# Patient Record
Sex: Female | Born: 1955
Health system: Southern US, Community
[De-identification: ages and names within clinical notes are randomized; demographics above are authoritative.]

## PROBLEM LIST (undated history)

## (undated) DIAGNOSIS — M810 Age-related osteoporosis without current pathological fracture: Secondary | ICD-10-CM

## (undated) DIAGNOSIS — Z720 Tobacco use: Secondary | ICD-10-CM

## (undated) DIAGNOSIS — M8580 Other specified disorders of bone density and structure, unspecified site: Secondary | ICD-10-CM

## (undated) DIAGNOSIS — C3490 Malignant neoplasm of unspecified part of unspecified bronchus or lung: Secondary | ICD-10-CM

## (undated) DIAGNOSIS — M1990 Unspecified osteoarthritis, unspecified site: Secondary | ICD-10-CM

## (undated) DIAGNOSIS — I4891 Unspecified atrial fibrillation: Secondary | ICD-10-CM

## (undated) HISTORY — PX: FRACTURE SURGERY: SHX138

## (undated) HISTORY — DX: Malignant neoplasm of unspecified part of unspecified bronchus or lung: C34.90

## (undated) HISTORY — DX: Tobacco use: Z72.0

## (undated) HISTORY — DX: Other specified disorders of bone density and structure, unspecified site: M85.80

## (undated) HISTORY — PX: KNEE ARTHROSCOPY: SUR90

## (undated) HISTORY — DX: Age-related osteoporosis without current pathological fracture: M81.0

## (undated) HISTORY — DX: Unspecified osteoarthritis, unspecified site: M19.90

---

## 2005-04-21 ENCOUNTER — Ambulatory Visit (HOSPITAL_COMMUNITY): Admission: RE | Admit: 2005-04-21 | Discharge: 2005-04-21 | Payer: Self-pay | Admitting: Internal Medicine

## 2005-04-21 ENCOUNTER — Ambulatory Visit: Payer: Self-pay | Admitting: Internal Medicine

## 2008-02-05 ENCOUNTER — Other Ambulatory Visit: Admission: RE | Admit: 2008-02-05 | Discharge: 2008-02-05 | Payer: Self-pay | Admitting: Obstetrics and Gynecology

## 2008-12-10 ENCOUNTER — Ambulatory Visit (HOSPITAL_COMMUNITY): Admission: RE | Admit: 2008-12-10 | Discharge: 2008-12-10 | Payer: Self-pay | Admitting: Obstetrics & Gynecology

## 2009-12-01 ENCOUNTER — Other Ambulatory Visit: Admission: RE | Admit: 2009-12-01 | Discharge: 2009-12-01 | Payer: Self-pay | Admitting: Obstetrics and Gynecology

## 2009-12-02 ENCOUNTER — Ambulatory Visit (HOSPITAL_COMMUNITY): Admission: RE | Admit: 2009-12-02 | Discharge: 2009-12-02 | Payer: Self-pay | Admitting: Obstetrics & Gynecology

## 2010-05-11 ENCOUNTER — Ambulatory Visit: Payer: Self-pay | Admitting: Orthopedic Surgery

## 2010-05-11 DIAGNOSIS — IMO0002 Reserved for concepts with insufficient information to code with codable children: Secondary | ICD-10-CM | POA: Insufficient documentation

## 2010-05-11 DIAGNOSIS — M25569 Pain in unspecified knee: Secondary | ICD-10-CM | POA: Insufficient documentation

## 2010-05-11 DIAGNOSIS — S82109A Unspecified fracture of upper end of unspecified tibia, initial encounter for closed fracture: Secondary | ICD-10-CM

## 2010-05-11 DIAGNOSIS — M1710 Unilateral primary osteoarthritis, unspecified knee: Secondary | ICD-10-CM

## 2010-05-11 HISTORY — DX: Unspecified fracture of upper end of unspecified tibia, initial encounter for closed fracture: S82.109A

## 2010-10-27 NOTE — Letter (Signed)
Summary: History form  History form   Imported By: Jacklynn Ganong 05/13/2010 08:13:56  _____________________________________________________________________  External Attachment:    Type:   Image     Comment:   External Document

## 2010-10-27 NOTE — Assessment & Plan Note (Signed)
Summary: left knee pain neds xr/bcbs/bsf   Vital Signs:  Patient profile:   55 year old female Height:      64 inches Weight:      143 pounds Pulse rate:   70 / minute Resp:     16 per minute  Visit Type:  new patient Referring Provider:  self Primary Provider:  NA  CC:  left knee pain.  History of Present Illness: I saw Cynthia Kelley in the office today for an initial visit.  She is a 55 years old woman with the complaint of:  left knee pain.  This is a 55 year old female as stated who had a severe fracture with a valgus load to the LEFT knee sustaining a tibial plateau split fracture with depression and ACL avulsion back in 1999 she was treated with open treatment internal fixation and bone graft.  She angulated on the fracture too soon and the fracture fixation failed.  She was then treated at [facility], [place] with an external fixator which she wore for 3 months and this stabilized the fracture but she was LEFT with residual valgus alignment to the LEFT knee.  She is done well until last month or so where she is noted intermittent pain.  She denies weakness catching or locking.       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 Cardiovascular:  Denies chest pain, palpitations, fainting, and murmurs. Respiratory:  Denies short of breath, wheezing, couch, tightness, pain on inspiration, and snoring . Gastrointestinal:  Denies heartburn, nausea, vomiting, diarrhea, constipation, and blood in your stools. Genitourinary:   Denies frequency, urgency, difficulty urinating, painful urination, flank pain, and bleeding in urine. Neurologic:  Denies numbness, tingling, unsteady gait, dizziness, tremors, and seizure. Musculoskeletal:  Denies joint pain, swelling, instability, stiffness, redness, heat, and muscle pain. Endocrine:  Denies excessive thirst, exessive urination, and heat or cold intolerance. Psychiatric:  Denies nervousness, depression, anxiety, and hallucinations. Skin:  Denies changes in the skin, poor healing, rash, itching, and redness. HEENT:  Denies blurred or double vision, eye pain, redness, and watering. Immunology:  Denies seasonal allergies, sinus problems, and allergic to bee stings. Hemoatologic:  Denies easy bleeding and brusing.  Physical Exam  Additional Exam:  She is a well-developed well-nourished thin framed female in no acute distress  Chest normal pulse perfusion to the LEFT lower extremity with no edema and normal pulses  She has no lymphadenopathy in the LEFT lower extremity  She has 2 skin incisions one is midline and along the second as a curvilinear incision over the lateral plateau both are healed without neuroma formation and negative Tinel sign  She's awake alert and oriented x3 mood and affect are normal.  We have normal reflexes and no gross sensory changes  She ambulates normally but has a valgus knee which is quite noticeable compared to her RIGHT leg  She has some tenderness in the lateral compartment no joint effusion.  Her range of motion is 125.  Her knee is stable.  She has no varus laxity in extension or flexion.  Strength is normal.  The right lower extremity: had normal alignment, ROM, Strength and stability    Impression & Recommendations:  Problem # 1:  KNEE, ARTHRITIS, DEGEN./OSTEO (ICD-715.96) Assessment New  Orders: New Patient Level III (16109) Knee x-ray,  3 views (60454) the x-rays show that the LEFT knee has significant amount of valgus and  valgus arthrosis with degeneration of the lateral compartment there is a medial buttress plate and one lateral 65 type cancellus bone screw.  There appears to be a bone fragment from the ACL avulsion which is in slightly elevated position but appears to be healed either by fibrous union or bony union.  My impression is that her knee is finally started to become symptomatic from the severe valgus arthrosis and posttraumatic arthritis.  But she is relatively pain-free except for some intermittent symptoms and should be followed over time she will call us when she has problems.  She is been advised that she will need a knee replacement  Addendum the lateral bone structure is very cystic and I suspect week and will need supplementation either by graft or by metal augmentation when the  knee replacement comes to fruition, including a stem.  Problem # 2:  KNEE PAIN (UJW-119.14) Assessment: New  Orders: New Patient Level III (78295) Knee x-ray,  3 views (62130)  Patient Instructions: 1)  Stay in touch if you have further problems  2)  I do see a replacement in the future

## 2011-02-12 NOTE — Op Note (Signed)
Cynthia Kelley, Cynthia Kelley                  ACCOUNT NO.:  [id number]   MEDICAL RECORD NO.:  [id number]          PATIENT TYPE:  AMB   LOCATION:  DAY                           FACILITY:  [facility]   PHYSICIAN:  R. Roetta Sessions, M.D. DATE OF BIRTH:  [birth year]   DATE OF PROCEDURE:  04/21/2005  DATE OF DISCHARGE:                                 OPERATIVE REPORT   PROCEDURE:  Screening colonoscopy.   INDICATIONS:  The patient is a 55 year old lady sent over at the courtesy of  Dr. Despina Hidden for colorectal cancer screening. She is devoid of any lower GI  tract symptoms. She has never had a colonoscopy, and there is no family  history of colorectal neoplasia. Colonoscopy is now being done as a  screening maneuver. This approach has been discussed with the patient at  length. Potential risks, benefits, and alternatives have been reviewed and  questions answered. She is agreeable. Please see documentation in the  medical record.   PROCEDURE NOTE:  O2 saturation, blood pressure, pulse, and respirations were  monitored throughout the entire procedure. Conscious sedation with Versed 4  mg IV and Demerol 100 mg IV in divided doses.   INSTRUMENT:  Olympus video chip system.   FINDINGS:  Digital rectal exam revealed no abnormalities.   ENDOSCOPIC FINDINGS:  Prep was good.   Rectum:  Examination of the rectal mucosa including retroflexed view of the  anal verge revealed no abnormalities.   Colon:  Colonic mucosa was surveyed from the rectosigmoid junction through  the left, transverse, and right colon to the area of the appendiceal  orifice, ileocecal valve, and cecum. These structures were well seen and  photographed for the record. From this level, the scope was slowly  withdrawn, and all previously mentioned mucosal surfaces were again seen.  The patient was noted to have pancolonic diverticula. The remainder of the  colonic mucosa appeared normal. The patient tolerated the procedure well and  was reactive  to endoscopy.   IMPRESSION:  1.  Normal rectum.  2.  Pancolonic diverticula. The remainder of colonic mucosa appeared normal.   RECOMMENDATIONS:  1.  Diverticulosis literature provided to Cynthia Kelley.  2.  Recommend repeat screening colonoscopy 10 years.       RMR/MEDQ  D:  04/21/2005  T:  04/21/2005  Job:  [id number]   cc:   Lazaro Arms, M.D.  [address]  [place]  [place] [address]  Fax: [phone]

## 2011-07-27 ENCOUNTER — Ambulatory Visit (INDEPENDENT_AMBULATORY_CARE_PROVIDER_SITE_OTHER): Payer: BC Managed Care – PPO | Admitting: Orthopedic Surgery

## 2011-07-27 ENCOUNTER — Encounter: Payer: Self-pay | Admitting: Orthopedic Surgery

## 2011-07-27 VITALS — BP 140/70 | Ht 65.0 in | Wt 145.0 lb

## 2011-07-27 DIAGNOSIS — S62109A Fracture of unspecified carpal bone, unspecified wrist, initial encounter for closed fracture: Secondary | ICD-10-CM

## 2011-07-27 MED ORDER — HYDROCODONE-ACETAMINOPHEN 5-325 MG PO TABS: 1.0000 | ORAL_TABLET | Freq: Four times a day (QID) | ORAL | Status: AC | PRN

## 2011-07-27 NOTE — Progress Notes (Signed)
Injury RIGHT wrist.  55 year old female fell on Saturday and injured her RIGHT wrist. She went to urgent care initially, the film is read as negative. The thumb was an over read as positive and a referral was made for an evaluation. She complains of pain in the RIGHT wrist with swelling.  She has had an osteoporosis, test, and her T score was 1.2. She is on calcium and vitamin D.  Review of systems negative.  History of severe fracture to her knee treated with ligament repair and internal fixation.  Examination Physical Exam(12) GENERAL: normal development   CDV: pulses are normal   Skin: normal  Lymph: nodes were not palpable/normal  Psychiatric: awake, alert and oriented  Neuro: normal sensation  MSK normal ambulation  1 No deformity over the RIGHT distal radius, there is swelling and tenderness at the fracture site, over the radial styloid, and metaphyseal region. 2 Passive range of motion is normal, muscle tone is normal. No instability at the fracture site or the wrist joint.  Assessment: X-rays were on the disc, but the fractures. Well-visualized is a nondisplaced fracture near the radial styloid, across the metaphyseal region    Plan: Splint

## 2011-07-27 NOTE — Patient Instructions (Signed)
Brace x 6 weeks 

## 2011-08-03 ENCOUNTER — Telehealth: Payer: Self-pay | Admitting: Orthopedic Surgery

## 2011-08-03 NOTE — Telephone Encounter (Signed)
Follow up call made to patient about brace from office visit 07/27/11 regarding exchange for another size (patient needs the medium size.)  Patient's next scheduled office appointment is 09/07/11.  Per [employer] brace representative, [employer] email (copied and pasted)    "wanted to let you know that I had a medium foam wrist brace sent to the home of Cynthia Kelley. [id number]. I will call her and let her know to be looking out for the brace.  I also ordered Dr. Romeo Apple some elbow braces and wrist braces, they should be there in a couple of days.  I think that is everything. Let me know if you have any questions.  Thank You,  Murrell Converse Sales Associate [facility] [address] [place], [place] [address] [phone] Cell [email or web address]

## 2011-08-05 NOTE — Telephone Encounter (Signed)
Can pick up mon pm

## 2011-08-05 NOTE — Telephone Encounter (Signed)
08/05/11 - per phone with patient, she received the brace and said it is fine, "fits much better."

## 2011-08-11 NOTE — Telephone Encounter (Signed)
It's ready

## 2011-08-26 ENCOUNTER — Other Ambulatory Visit: Payer: Self-pay | Admitting: Orthopedic Surgery

## 2011-08-26 NOTE — Telephone Encounter (Signed)
Refill

## 2011-09-07 ENCOUNTER — Ambulatory Visit (INDEPENDENT_AMBULATORY_CARE_PROVIDER_SITE_OTHER): Payer: BC Managed Care – PPO | Admitting: Orthopedic Surgery

## 2011-09-07 ENCOUNTER — Encounter: Payer: Self-pay | Admitting: Orthopedic Surgery

## 2011-09-07 DIAGNOSIS — S62109A Fracture of unspecified carpal bone, unspecified wrist, initial encounter for closed fracture: Secondary | ICD-10-CM

## 2011-09-07 NOTE — Progress Notes (Signed)
Followup visit x-rays scheduled today for nondisplaced RIGHT distal radius fracture  Patient has history of osteopenia  She is doing well at this time she still has some discomfort over the RIGHT distal radius  Palpable tenderness is still over the RIGHT distal radius  X-ray was taken today 3 views show fracture healing no displacement  Patient will continue brace for 2 more weeks we've decided to not x-ray her again as the fractures nondisplaced and is improving  We discussed possibility gone on medication for osteoporosis but she does not have that we recommended 2 year followup from her last bone density which showed a T score of 1.2  X-ray report 3 views RIGHT wrist  Distal radius fracture same nondisplaced callous forming dorsally.  Impression healing distal radius fracture nondisplaced

## 2011-09-07 NOTE — Patient Instructions (Signed)
Wear brace about 2 more weeks

## 2012-04-04 ENCOUNTER — Other Ambulatory Visit (HOSPITAL_COMMUNITY)
Admission: RE | Admit: 2012-04-04 | Discharge: 2012-04-04 | Disposition: A | Discharge status: Home / Self Care | Payer: BC Managed Care – PPO | Source: Ambulatory Visit | Attending: Obstetrics and Gynecology | Admitting: Obstetrics and Gynecology

## 2012-04-04 ENCOUNTER — Other Ambulatory Visit: Payer: Self-pay | Admitting: Adult Health

## 2012-04-04 DIAGNOSIS — Z01419 Encounter for gynecological examination (general) (routine) without abnormal findings: Secondary | ICD-10-CM | POA: Insufficient documentation

## 2012-04-04 DIAGNOSIS — Z1159 Encounter for screening for other viral diseases: Secondary | ICD-10-CM | POA: Insufficient documentation

## 2012-04-11 ENCOUNTER — Other Ambulatory Visit: Payer: Self-pay | Admitting: Adult Health

## 2012-04-11 DIAGNOSIS — Z139 Encounter for screening, unspecified: Secondary | ICD-10-CM

## 2012-04-24 ENCOUNTER — Ambulatory Visit (HOSPITAL_COMMUNITY)
Admission: RE | Admit: 2012-04-24 | Discharge: 2012-04-24 | Disposition: A | Discharge status: Home / Self Care | Payer: BC Managed Care – PPO | Source: Ambulatory Visit | Attending: Adult Health | Admitting: Adult Health

## 2012-04-24 DIAGNOSIS — Z1231 Encounter for screening mammogram for malignant neoplasm of breast: Secondary | ICD-10-CM | POA: Insufficient documentation

## 2012-04-24 DIAGNOSIS — Z139 Encounter for screening, unspecified: Secondary | ICD-10-CM

## 2013-05-15 ENCOUNTER — Other Ambulatory Visit: Payer: Self-pay | Admitting: Adult Health

## 2013-05-15 ENCOUNTER — Encounter: Payer: Self-pay | Admitting: Adult Health

## 2013-05-15 ENCOUNTER — Ambulatory Visit (INDEPENDENT_AMBULATORY_CARE_PROVIDER_SITE_OTHER): Payer: BC Managed Care – PPO | Admitting: Adult Health

## 2013-05-15 VITALS — BP 120/70 | HR 70 | Ht 64.0 in | Wt 146.0 lb

## 2013-05-15 DIAGNOSIS — M8580 Other specified disorders of bone density and structure, unspecified site: Secondary | ICD-10-CM

## 2013-05-15 DIAGNOSIS — Z01419 Encounter for gynecological examination (general) (routine) without abnormal findings: Secondary | ICD-10-CM

## 2013-05-15 DIAGNOSIS — Z1212 Encounter for screening for malignant neoplasm of rectum: Secondary | ICD-10-CM

## 2013-05-15 DIAGNOSIS — Z780 Asymptomatic menopausal state: Secondary | ICD-10-CM

## 2013-05-15 HISTORY — DX: Other specified disorders of bone density and structure, unspecified site: M85.80

## 2013-05-15 LAB — HEMOCCULT GUIAC POC 1CARD (OFFICE)

## 2013-05-15 MED ORDER — PREDNICARBATE 0.1 % EX CREA: 1.0000 "application " | TOPICAL_CREAM | Freq: Two times a day (BID) | CUTANEOUS | Status: DC

## 2013-05-15 NOTE — Progress Notes (Signed)
Patient ID: Cynthia Kelley, female   DOB: 09-03-1956, 57 y.o.   MRN: [id number] History of Present Illness: Cynthia Kelley is a 57 year old white female in for a physical. She had a normal pap with negative HPV 03/2012.She had a normal mammogram in 03/2012 and labs in 2013 too.  Current Medications, Allergies, Past Medical History, Past Surgical History, Family History and Social History were reviewed in [employer] record.     Review of Systems: Patient denies any headaches, blurred vision, shortness of breath, chest pain, abdominal pain, problems with bowel movements, urination, or intercourse. No mood changes.She does have pain top of left foot near toes, she thinks it may be stress fracture, she has had before on the right foot.She also has a red dry patch that comes and goes over eye and has cream from dermatologist that helps but needs refill.Had osteopenia on dexa in 2011    Physical Exam:BP 120/70  Pulse 70  Ht 5\' 4"  (1.626 m)  Wt 146 lb (66.225 kg)  BMI 25.05 kg/m2 General:  Well developed, well nourished, no acute distress Skin:  Warm and dry Neck:  Midline trachea, normal thyroid Lungs; Clear to auscultation bilaterally Breast:  No dominant palpable mass, retraction, or nipple discharge Cardiovascular: Regular rate and rhythm Abdomen:  Soft, non tender, no hepatosplenomegaly Pelvic:  External genitalia is normal in appearance.  The vagina is normal in appearance. The cervix is bulbous.  Uterus is felt to be normal size, shape, and contour.  No adnexal masses or tenderness noted. Rectal: Good sphincter tone, no polyps, or hemorrhoids felt.  Hemoccult negative. Extremities:  No swelling or varicosities noted Psych:  Alert and cooperative, seems happy   Impression: Yearly gyn exam-no pap Osteopenia     Plan: Physical in 1 year Mammogram yearly Labs in 2 -3 years Dexa scan 8/25 at 1:30 pm  Colonoscopy at 58 Refilled prednicarbate 0.1% cream bid to affected  area prn #60 gm tube no refills

## 2013-05-15 NOTE — Patient Instructions (Addendum)
Physical in 1 year Mammogram yearly colonoscopy at 11

## 2013-05-21 ENCOUNTER — Telehealth: Payer: Self-pay | Admitting: *Deleted

## 2013-05-21 ENCOUNTER — Ambulatory Visit (HOSPITAL_COMMUNITY)
Admission: RE | Admit: 2013-05-21 | Discharge: 2013-05-21 | Disposition: A | Discharge status: Home / Self Care | Payer: BC Managed Care – PPO | Source: Ambulatory Visit | Attending: Adult Health | Admitting: Adult Health

## 2013-05-21 DIAGNOSIS — Z780 Asymptomatic menopausal state: Secondary | ICD-10-CM

## 2013-05-21 DIAGNOSIS — M8580 Other specified disorders of bone density and structure, unspecified site: Secondary | ICD-10-CM

## 2013-05-22 ENCOUNTER — Telehealth: Payer: Self-pay | Admitting: *Deleted

## 2013-05-22 ENCOUNTER — Telehealth: Payer: Self-pay | Admitting: Adult Health

## 2013-05-22 NOTE — Telephone Encounter (Signed)
Left message that dexa good

## 2013-05-22 NOTE — Telephone Encounter (Signed)
Pt aware of dexa results

## 2013-05-22 NOTE — Telephone Encounter (Signed)
Problem already taken care of. °

## 2013-06-21 ENCOUNTER — Other Ambulatory Visit: Payer: Self-pay | Admitting: Adult Health

## 2013-06-21 DIAGNOSIS — Z139 Encounter for screening, unspecified: Secondary | ICD-10-CM

## 2013-07-03 ENCOUNTER — Ambulatory Visit (HOSPITAL_COMMUNITY)
Admission: RE | Admit: 2013-07-03 | Discharge: 2013-07-03 | Disposition: A | Discharge status: Home / Self Care | Payer: BC Managed Care – PPO | Source: Ambulatory Visit | Attending: Adult Health | Admitting: Adult Health

## 2013-07-03 DIAGNOSIS — Z139 Encounter for screening, unspecified: Secondary | ICD-10-CM

## 2013-07-03 DIAGNOSIS — Z1231 Encounter for screening mammogram for malignant neoplasm of breast: Secondary | ICD-10-CM | POA: Insufficient documentation

## 2014-05-16 ENCOUNTER — Encounter: Payer: Self-pay | Admitting: Adult Health

## 2014-05-16 ENCOUNTER — Ambulatory Visit (INDEPENDENT_AMBULATORY_CARE_PROVIDER_SITE_OTHER): Payer: BC Managed Care – PPO | Admitting: Adult Health

## 2014-05-16 VITALS — BP 138/80 | HR 74 | Ht 63.5 in | Wt 144.5 lb

## 2014-05-16 DIAGNOSIS — Z01419 Encounter for gynecological examination (general) (routine) without abnormal findings: Secondary | ICD-10-CM

## 2014-05-16 DIAGNOSIS — Z1212 Encounter for screening for malignant neoplasm of rectum: Secondary | ICD-10-CM

## 2014-05-16 LAB — COMPREHENSIVE METABOLIC PANEL
ALBUMIN: 4.6 g/dL (ref 3.5–5.2)
ALT: 17 U/L (ref 0–35)
AST: 22 U/L (ref 0–37)
Alkaline Phosphatase: 36 U/L — ABNORMAL LOW (ref 39–117)
BUN: 7 mg/dL (ref 6–23)
CALCIUM: 9.6 mg/dL (ref 8.4–10.5)
CHLORIDE: 98 meq/L (ref 96–112)
CO2: 26 meq/L (ref 19–32)
CREATININE: 0.52 mg/dL (ref 0.50–1.10)
Glucose, Bld: 87 mg/dL (ref 70–99)
POTASSIUM: 4.9 meq/L (ref 3.5–5.3)
SODIUM: 134 meq/L — AB (ref 135–145)
TOTAL PROTEIN: 7 g/dL (ref 6.0–8.3)
Total Bilirubin: 0.4 mg/dL (ref 0.2–1.2)

## 2014-05-16 LAB — HEMOCCULT GUIAC POC 1CARD (OFFICE): Fecal Occult Blood, POC: NEGATIVE

## 2014-05-16 LAB — CBC
HCT: 38.7 % (ref 36.0–46.0)
HEMOGLOBIN: 13.5 g/dL (ref 12.0–15.0)
MCH: 32.3 pg (ref 26.0–34.0)
MCHC: 34.9 g/dL (ref 30.0–36.0)
MCV: 92.6 fL (ref 78.0–100.0)
Platelets: 320 10*3/uL (ref 150–400)
RBC: 4.18 MIL/uL (ref 3.87–5.11)
RDW: 13.9 % (ref 11.5–15.5)
WBC: 4.5 10*3/uL (ref 4.0–10.5)

## 2014-05-16 LAB — LIPID PANEL
CHOL/HDL RATIO: 1.8 ratio
Cholesterol: 183 mg/dL (ref 0–200)
HDL: 99 mg/dL (ref >39)
LDL Cholesterol: 75 mg/dL (ref 0–99)
Triglycerides: 46 mg/dL (ref <150)
VLDL: 9 mg/dL (ref 0–40)

## 2014-05-16 LAB — TSH: TSH: 1.002 u[IU]/mL (ref 0.350–4.500)

## 2014-05-16 NOTE — Patient Instructions (Signed)
Physical in 1 year Mammogram yearly Colonoscopy at 31

## 2014-05-16 NOTE — Progress Notes (Signed)
Patient ID: Cynthia Kelley, female   DOB: 05/17/1956, 58 y.o.   MRN: [id number] History of Present Illness: Cynthia Kelley is a 58 year old white female, married in for a physical.She had a normal pap with negative HPV 04/04/12.   Current Medications, Allergies, Past Medical History, Past Surgical History, Family History and Social History were reviewed in [employer] record.     Review of Systems: Patient denies any headaches, blurred vision, shortness of breath, chest pain, abdominal pain, problems with bowel movements, urination, or intercourse. No joint pain or mood swings.    Physical Exam:BP 138/80  Pulse 74  Ht 5' 3.5" (1.613 m)  Wt 144 lb 8 oz (65.545 kg)  BMI 25.19 kg/m2 General:  Well developed, well nourished, no acute distress Skin:  Warm and dry Neck:  Midline trachea, normal thyroid Lungs; Clear to auscultation bilaterally Breast:  No dominant palpable mass, retraction, or nipple discharge Cardiovascular: Regular rate and rhythm Abdomen:  Soft, non tender, no hepatosplenomegaly Pelvic:  External genitalia is normal in appearance.  The vagina has decrease color,moisture and rugae. The cervix is smooth.  Uterus is felt to be normal size, shape, and contour.  No                adnexal masses or tenderness noted. Rectal: Good sphincter tone, no polyps, or hemorrhoids felt.  Hemoccult negative. Extremities:  No swelling or varicosities noted Psych:  No mood changes, alert and cooperative, seems happy   Impression:  Yearly gyn exam no pap   Plan: Check CBC,CMP,TSH and lipids Physical and pap in 1 year Colonoscopy 2016  Mammogram yearly

## 2014-05-20 ENCOUNTER — Telehealth: Payer: Self-pay | Admitting: Adult Health

## 2014-05-20 NOTE — Telephone Encounter (Signed)
Pt aware of labs  

## 2014-07-29 ENCOUNTER — Encounter: Payer: Self-pay | Admitting: Adult Health

## 2014-10-10 ENCOUNTER — Other Ambulatory Visit: Payer: Self-pay | Admitting: Adult Health

## 2014-10-10 DIAGNOSIS — Z1231 Encounter for screening mammogram for malignant neoplasm of breast: Secondary | ICD-10-CM

## 2014-11-08 ENCOUNTER — Ambulatory Visit (HOSPITAL_COMMUNITY): Payer: Self-pay

## 2014-12-06 ENCOUNTER — Ambulatory Visit (HOSPITAL_COMMUNITY)
Admission: RE | Admit: 2014-12-06 | Discharge: 2014-12-06 | Disposition: A | Discharge status: Home / Self Care | Payer: 59 | Source: Ambulatory Visit | Attending: Adult Health | Admitting: Adult Health

## 2014-12-06 DIAGNOSIS — Z1231 Encounter for screening mammogram for malignant neoplasm of breast: Secondary | ICD-10-CM

## 2015-05-22 ENCOUNTER — Ambulatory Visit (INDEPENDENT_AMBULATORY_CARE_PROVIDER_SITE_OTHER): Payer: 59 | Admitting: Adult Health

## 2015-05-22 ENCOUNTER — Other Ambulatory Visit (HOSPITAL_COMMUNITY)
Admission: RE | Admit: 2015-05-22 | Discharge: 2015-05-22 | Disposition: A | Discharge status: Home / Self Care | Payer: 59 | Source: Ambulatory Visit | Attending: Adult Health | Admitting: Adult Health

## 2015-05-22 ENCOUNTER — Encounter: Payer: Self-pay | Admitting: Adult Health

## 2015-05-22 VITALS — BP 130/72 | HR 76 | Ht 63.5 in | Wt 142.0 lb

## 2015-05-22 DIAGNOSIS — Z1151 Encounter for screening for human papillomavirus (HPV): Secondary | ICD-10-CM | POA: Insufficient documentation

## 2015-05-22 DIAGNOSIS — Z01419 Encounter for gynecological examination (general) (routine) without abnormal findings: Secondary | ICD-10-CM

## 2015-05-22 DIAGNOSIS — Z1211 Encounter for screening for malignant neoplasm of colon: Secondary | ICD-10-CM

## 2015-05-22 LAB — HEMOCCULT GUIAC POC 1CARD (OFFICE): FECAL OCCULT BLD: NEGATIVE

## 2015-05-22 NOTE — Patient Instructions (Signed)
Physical in 1 year Mammogram yearly Colonoscopy due Labs in 1 year

## 2015-05-22 NOTE — Progress Notes (Signed)
Patient ID: Cynthia Kelley, female   DOB: Mar 15, 1956, 59 y.o.   MRN: [id number] History of Present Illness: Faiza is a 59 year old white female, married in for well woman gyn exam and pap.   Current Medications, Allergies, Past Medical History, Past Surgical History, Family History and Social History were reviewed in [employer] record.     Review of Systems: Patient denies any headaches, hearing loss, fatigue, blurred vision, shortness of breath, chest pain, abdominal pain, problems with bowel movements, urination, or intercourse. No joint pain or mood swings.    Physical Exam:BP 130/72 mmHg  Pulse 76  Ht 5' 3.5" (1.613 m)  Wt 142 lb (64.411 kg)  BMI 24.76 kg/m2 General:  Well developed, well nourished, no acute distress Skin:  Warm and dry,tan Neck:  Midline trachea, normal thyroid, good ROM, no lymphadenopathy, no carotid bruits heard Lungs; Clear to auscultation bilaterally Breast:  No dominant palpable mass, retraction, or nipple discharge Cardiovascular: Regular rate and rhythm Abdomen:  Soft, non tender, no hepatosplenomegaly Pelvic:  External genitalia is normal in appearance, no lesions.  The vagina is pale with loss of moisture and rugae. Urethra has no lesions or masses. The cervix is smooth, pap with HPV performed.  Uterus is felt to be normal size, shape, and contour.  No adnexal masses or tenderness noted.Bladder is non tender, no masses felt. Rectal: Good sphincter tone, no polyps, or hemorrhoids felt.  Hemoccult negative. Extremities/musculoskeletal:  No swelling or varicosities noted, no clubbing or cyanosis Psych:  No mood changes, alert and cooperative,seems happy   Impression: Well woman gyn exam with pap    Plan: Physical in 1 year Mammogram yearly Labs next year Colonoscopy pr GI, due about now

## 2015-05-23 LAB — CYTOLOGY - PAP

## 2016-03-09 ENCOUNTER — Other Ambulatory Visit: Payer: Self-pay | Admitting: Adult Health

## 2016-03-09 DIAGNOSIS — Z1231 Encounter for screening mammogram for malignant neoplasm of breast: Secondary | ICD-10-CM

## 2016-03-25 ENCOUNTER — Ambulatory Visit (HOSPITAL_COMMUNITY)
Admission: RE | Admit: 2016-03-25 | Discharge: 2016-03-25 | Disposition: A | Discharge status: Home / Self Care | Payer: BLUE CROSS/BLUE SHIELD | Source: Ambulatory Visit | Attending: Adult Health | Admitting: Adult Health

## 2016-03-25 DIAGNOSIS — Z1231 Encounter for screening mammogram for malignant neoplasm of breast: Secondary | ICD-10-CM | POA: Diagnosis not present

## 2016-05-25 ENCOUNTER — Encounter: Payer: Self-pay | Admitting: Adult Health

## 2016-05-25 ENCOUNTER — Ambulatory Visit (INDEPENDENT_AMBULATORY_CARE_PROVIDER_SITE_OTHER): Payer: BLUE CROSS/BLUE SHIELD | Admitting: Adult Health

## 2016-05-25 VITALS — BP 134/70 | HR 78 | Ht 63.0 in | Wt 142.5 lb

## 2016-05-25 DIAGNOSIS — Z1211 Encounter for screening for malignant neoplasm of colon: Secondary | ICD-10-CM

## 2016-05-25 DIAGNOSIS — Z01419 Encounter for gynecological examination (general) (routine) without abnormal findings: Secondary | ICD-10-CM

## 2016-05-25 LAB — HEMOCCULT GUIAC POC 1CARD (OFFICE): Fecal Occult Blood, POC: NEGATIVE

## 2016-05-25 NOTE — Progress Notes (Signed)
Patient ID: Cynthia Kelley, female   DOB: July 13, 1956, 60 y.o.   MRN: [id number] History of Present Illness: Cynthia Kelley is a 60 year old white female in for a well woman gyn exam, she had a normal pap with negative HPV 05/22/15.   Current Medications, Allergies, Past Medical History, Past Surgical History, Family History and Social History were reviewed in [employer] record.     Review of Systems: Patient denies any headaches, hearing loss, fatigue, blurred vision, shortness of breath, chest pain, abdominal pain, problems with bowel movements, urination, or intercourse. No joint pain or mood swings.    Physical Exam:BP 134/70 (BP Location: Left Arm, Patient Position: Sitting, Cuff Size: Normal)   Pulse 78   Ht '5\' 3"'$  (1.6 m)   Wt 142 lb 8 oz (64.6 kg)   BMI 25.24 kg/m  General:  Well developed, well nourished, no acute distress Skin:  Warm and dry,tan Neck:  Midline trachea, normal thyroid, good ROM, no lymphadenopathy,no carotid bruits heard Lungs; Clear to auscultation bilaterally Breast:  No dominant palpable mass, retraction, or nipple discharge Cardiovascular: Regular rate and rhythm Abdomen:  Soft, non tender, no hepatosplenomegaly Pelvic:  External genitalia is normal in appearance, no lesions.  The vagina is pale with loss of moisture and rugae.Marland Kitchen Urethra has no lesions or masses. The cervix is smooth.  Uterus is felt to be normal size, shape, and contour.  No adnexal masses or tenderness noted.Bladder is non tender, no masses felt. Rectal: Good sphincter tone, no polyps, or hemorrhoids felt.  Hemoccult negative. Extremities/musculoskeletal:  No swelling or varicosities noted, no clubbing or cyanosis Psych:  No mood changes, alert and cooperative,seems happy   Impression:  Well woman exam with routine gynecological exam - Plan: POCT occult blood stool    Plan: Physical and labs in 1 year, pap 2019 Mammogram yearly Colonoscopy advised

## 2016-05-25 NOTE — Patient Instructions (Signed)
Physical and labs next year Mammogram yearly Colonoscopy advised

## 2016-12-02 ENCOUNTER — Encounter (HOSPITAL_COMMUNITY): Payer: Self-pay | Admitting: Emergency Medicine

## 2016-12-02 ENCOUNTER — Emergency Department (HOSPITAL_COMMUNITY)
Admission: EM | Admit: 2016-12-02 | Discharge: 2016-12-02 | Disposition: A | Discharge status: Home / Self Care | Payer: BLUE CROSS/BLUE SHIELD | Attending: Emergency Medicine | Admitting: Emergency Medicine

## 2016-12-02 DIAGNOSIS — S8011XA Contusion of right lower leg, initial encounter: Secondary | ICD-10-CM | POA: Diagnosis not present

## 2016-12-02 DIAGNOSIS — S8991XA Unspecified injury of right lower leg, initial encounter: Secondary | ICD-10-CM | POA: Diagnosis present

## 2016-12-02 DIAGNOSIS — Y939 Activity, unspecified: Secondary | ICD-10-CM | POA: Insufficient documentation

## 2016-12-02 DIAGNOSIS — Y999 Unspecified external cause status: Secondary | ICD-10-CM | POA: Diagnosis not present

## 2016-12-02 DIAGNOSIS — F17210 Nicotine dependence, cigarettes, uncomplicated: Secondary | ICD-10-CM | POA: Insufficient documentation

## 2016-12-02 DIAGNOSIS — T148XXA Other injury of unspecified body region, initial encounter: Secondary | ICD-10-CM

## 2016-12-02 DIAGNOSIS — Y92007 Garden or yard of unspecified non-institutional (private) residence as the place of occurrence of the external cause: Secondary | ICD-10-CM | POA: Diagnosis not present

## 2016-12-02 DIAGNOSIS — W228XXA Striking against or struck by other objects, initial encounter: Secondary | ICD-10-CM | POA: Insufficient documentation

## 2016-12-02 MED ORDER — POVIDONE-IODINE 10 % EX SOLN
CUTANEOUS | Status: AC
  Filled 2016-12-02: qty 118

## 2016-12-02 MED ORDER — LIDOCAINE HCL (PF) 1 % IJ SOLN
5.0000 mL | Freq: Once | INTRAMUSCULAR | Status: AC
  Administered 2016-12-02: 5 mL
  Filled 2016-12-02: qty 5

## 2016-12-02 NOTE — ED Provider Notes (Signed)
[facility] Provider Note   CSN: [id number] Arrival date & time: 12/02/16  1044     History   Chief Complaint Chief Complaint  Patient presents with  . Abscess    HPI Cynthia Kelley is a 61 y.o. female presents with a two-week history of right lower leg pain after falling in her garden and hitting her leg on a branch. Patient reports associated swelling and discoloration to the wound. Patient saw urgent care 2 days ago and was given antibiotics. Patient reports that the doctor at urgent care wanted to incise and drain the wound, however patient did not want to do that at that time. Patient denies any other symptoms, including fever, nausea, vomiting. Patient has been taking Bactrim since it was prescribed 2 days ago (4 doses).  HPI  Past Medical History:  Diagnosis Date  . Osteopenia 05/15/2013    Patient Active Problem List   Diagnosis Date Noted  . Osteopenia 05/15/2013  . Wrist fracture 07/27/2011  . KNEE, ARTHRITIS, DEGEN./OSTEO 05/11/2010  . KNEE PAIN 05/11/2010  . FRACTURE, TIBIAL PLATEAU 05/11/2010    Past Surgical History:  Procedure Laterality Date  . KNEE ARTHROSCOPY      OB History    Gravida Para Term Preterm AB Living   '1 1       1   '$ SAB TAB Ectopic Multiple Live Births           1       Home Medications    Prior to Admission medications   Medication Sig Start Date End Date Taking? Authorizing Provider  Prednicarbate 0.1 % CREA Apply 1 application topically 2 (two) times daily. Patient taking differently: Apply 1 application topically as needed.  05/15/13   Estill Dooms, NP    Family History Family History  Problem Relation Age of Onset  . Cancer Father     died in 16's everywhere  . Heart disease Mother   . Cancer Mother     lung  . Asthma Daughter     Social History Social History  Substance Use Topics  . Smoking status: Current Every Day Smoker    Packs/day: 0.50    Years: 20.00    Types: Cigarettes  . Smokeless  tobacco: Never Used  . Alcohol use Yes     Comment: 7-8 drinks a week     Allergies   Patient has no known allergies.   Review of Systems Review of Systems  Constitutional: Negative for fever.  Gastrointestinal: Negative for nausea and vomiting.  Skin: Positive for color change and wound.     Physical Exam Updated Vital Signs BP 159/76 (BP Location: Right Arm)   Pulse 95   Temp 99 F (37.2 C) (Oral)   Resp 20   Ht '5\' 5"'$  (1.651 m)   Wt 65.8 kg   SpO2 100%   BMI 24.13 kg/m   Physical Exam  Constitutional: She appears well-developed and well-nourished. No distress.  HENT:  Head: Normocephalic and atraumatic.  Mouth/Throat: Oropharynx is clear and moist. No oropharyngeal exudate.  Eyes: Conjunctivae are normal. Pupils are equal, round, and reactive to light. Right eye exhibits no discharge. Left eye exhibits no discharge. No scleral icterus.  Neck: Normal range of motion. Neck supple. No thyromegaly present.  Cardiovascular: Normal rate, regular rhythm, normal heart sounds and intact distal pulses.  Exam reveals no gallop and no friction rub.   No murmur heard. Pulmonary/Chest: Effort normal and breath sounds normal. No stridor. No respiratory  distress. She has no wheezes. She has no rales.  Abdominal: Soft. Bowel sounds are normal. She exhibits no distension. There is no tenderness. There is no rebound and no guarding.  Musculoskeletal: She exhibits no edema.  Lymphadenopathy:    She has no cervical adenopathy.  Neurological: She is alert. Coordination normal.  Skin: Skin is warm and dry. No rash noted. She is not diaphoretic. No pallor.  ~5cm area of fluctuance swelling and tenderness to R lower leg with overlying scabbing- see image  Ecchymosis without tenderness to R ankle and foot, see image  Psychiatric: She has a normal mood and affect.  Nursing note and vitals reviewed.          ED Treatments / Results  Labs (all labs ordered are listed, but only  abnormal results are displayed) Labs Reviewed - No data to display  EKG  EKG Interpretation None       Radiology No results found.  Procedures Procedures (including critical care time)  INCISION AND DRAINAGE Performed by: Frederica Kuster Consent: Verbal consent obtained. Risks and benefits: risks, benefits and alternatives were discussed Type: hematoma  Body area: R lower leg  Anesthesia: local infiltration  Incision was made with a scalpel.  Local anesthetic: lidocaine 1/% w/o epinephrine  Anesthetic total: 3 ml  Complexity: complex  Drainage: blood and gelatinous blood material  Drainage amount: significant  Packing material: none  Patient tolerance: Patient tolerated the procedure well with no immediate complications.     Medications Ordered in ED Medications  lidocaine (PF) (XYLOCAINE) 1 % injection 5 mL (5 mLs Infiltration Given by Other 12/02/16 1250)     Initial Impression / Assessment and Plan / ED Course  I have reviewed the triage vital signs and the nursing notes.  Pertinent labs & imaging results that were available during my care of the patient were reviewed by me and considered in my medical decision making (see chart for details).     Patient with heamtoma. Incision and drainage of ultimately hematoma performed in the ED today.  Area minimized significantly. Suspect bruising related to injury. Discontinue Bactrim since not infection at this time. Follow-up in 2 days for recheck. Strict return precautions given. Patient understands and agrees with plan. Patient vitals stable throughout ED course and discharged in satisfactory condition. Patient also evaluated by Dr. Rogene Houston who guided the patient's management and agrees with plan.    Final Clinical Impressions(s) / ED Diagnoses   Final diagnoses:  Hematoma    New Prescriptions Discharge Medication List as of 12/02/2016  1:04 PM       Frederica Kuster, PA-C 12/04/16 0102    Fredia Sorrow, MD 12/04/16 [phone]

## 2016-12-02 NOTE — ED Triage Notes (Signed)
PT c/o swelling, pain and blackened areas to right lower lateral leg that developed after a leg injury in the past 2 weeks. PT states no relief from antibiotics that were prescribed by urgent care x2 days ago.

## 2016-12-02 NOTE — ED Provider Notes (Signed)
Medical screening examination/treatment/procedure(s) were conducted as a shared visit with non-physician practitioner(s) and myself.  I personally evaluated the patient during the encounter.   EKG Interpretation None       Patient seen by me along with the physician assistant. Patient with an injury to the lateral aspect of her right leg about 2 weeks ago. Was hit by a branch. Patient had a black and swollen area there. And had the bruising that seemed to collect down around her foot and ankle. This is probably consistent with a hematoma. She was started on Bactrim by primary care doctor 2 days ago thinking that there was an abscess. Wound was open thinking it was an abscess no purulent discharge just jellylike hematoma. No active bleeding currently. Will be left open. Local wound care is appropriate. As stated no signs of infection just the injury resulted in a hematoma which explains the bruising around the foot and ankle.   Fredia Sorrow, MD 12/02/16 1254

## 2016-12-02 NOTE — Discharge Instructions (Signed)
You can discontinue your Bactrim. Leave the dressing applied until tomorrow. At that time, you can wash the area with warm soapy water. Apply clean dressing. Please return to the emergency department 2 days for wound recheck. Please return sooner if you develop any increasing pain, redness, swelling, drainage from the area, chest pain, shortness of breath, or any other concerning symptoms.

## 2016-12-04 ENCOUNTER — Emergency Department (HOSPITAL_COMMUNITY)
Admission: EM | Admit: 2016-12-04 | Discharge: 2016-12-04 | Discharge status: Absent without leave | Payer: BLUE CROSS/BLUE SHIELD

## 2016-12-14 ENCOUNTER — Encounter: Payer: Self-pay | Admitting: Family Medicine

## 2016-12-14 ENCOUNTER — Ambulatory Visit (INDEPENDENT_AMBULATORY_CARE_PROVIDER_SITE_OTHER): Payer: BLUE CROSS/BLUE SHIELD | Admitting: Family Medicine

## 2016-12-14 VITALS — BP 144/84 | HR 88 | Temp 97.1°F | Resp 16 | Ht 65.0 in | Wt 144.1 lb

## 2016-12-14 DIAGNOSIS — Z720 Tobacco use: Secondary | ICD-10-CM | POA: Diagnosis not present

## 2016-12-14 DIAGNOSIS — Z1211 Encounter for screening for malignant neoplasm of colon: Secondary | ICD-10-CM

## 2016-12-14 DIAGNOSIS — Z23 Encounter for immunization: Secondary | ICD-10-CM | POA: Diagnosis not present

## 2016-12-14 DIAGNOSIS — Z7689 Persons encountering health services in other specified circumstances: Secondary | ICD-10-CM

## 2016-12-14 DIAGNOSIS — I739 Peripheral vascular disease, unspecified: Secondary | ICD-10-CM | POA: Diagnosis not present

## 2016-12-14 DIAGNOSIS — Z2821 Immunization not carried out because of patient refusal: Secondary | ICD-10-CM

## 2016-12-14 HISTORY — DX: Tobacco use: Z72.0

## 2016-12-14 HISTORY — DX: Peripheral vascular disease, unspecified: I73.9

## 2016-12-14 LAB — CBC
HEMATOCRIT: 39.8 % (ref 35.0–45.0)
HEMOGLOBIN: 13.5 g/dL (ref 11.7–15.5)
MCH: 32.1 pg (ref 27.0–33.0)
MCHC: 33.9 g/dL (ref 32.0–36.0)
MCV: 94.8 fL (ref 80.0–100.0)
MPV: 8.8 fL (ref 7.5–12.5)
Platelets: 352 10*3/uL (ref 140–400)
RBC: 4.2 MIL/uL (ref 3.80–5.10)
RDW: 14.4 % (ref 11.0–15.0)
WBC: 6.5 10*3/uL (ref 3.8–10.8)

## 2016-12-14 NOTE — Patient Instructions (Signed)
Try to quit smoking  I will order a cologuard  Need blood work I will send you a letter with your test results.  If there is anything of concern, we will call right away.  Walk every day that you are able  See me yearly  See about insurance approval for a shingles shot

## 2016-12-14 NOTE — Progress Notes (Signed)
Chief Complaint  Patient presents with  . Establish Care   New to establish Has no ongoing medical problems Takes no prescriptions I have discussed the multiple health risks associated with cigarette smoking including, but not limited to, cardiovascular disease, lung disease and cancer.  I have strongly recommended that smoking be stopped.  I have reviewed the various methods of quitting including cold [place], classes, nicotine replacements and prescription medications.  I have offered assistance in this difficult process.  The patient is not interested in assistance at this time. She drinks alcohol daily, states 1-2 beers a day.   GYN care up to date No regular eye care or dental visits Last colonoscopy at age 53.  Agrees to cologuard. Discussed immunizations.  Recommend prevnar, shingles vaccine and flu shot - all declined.  Will consider the shingles, but needs insurance approval for shingrix.     Patient Active Problem List   Diagnosis Date Noted  . Tobacco abuse 12/14/2016  . Asymptomatic PVD (peripheral vascular disease) ([place]) 12/14/2016  . Influenza vaccine refused 12/14/2016  . Osteopenia 05/15/2013  . KNEE, ARTHRITIS, DEGEN./OSTEO 05/11/2010  . FRACTURE, TIBIAL PLATEAU 05/11/2010    Outpatient Encounter Prescriptions as of 12/14/2016  Medication Sig   No facility-administered encounter medications on file as of 12/14/2016.     Past Medical History:  Diagnosis Date  . Arthritis   . Osteopenia 05/15/2013  . Osteoporosis    osteopenia  . Tobacco abuse 12/14/2016    Past Surgical History:  Procedure Laterality Date  . FRACTURE SURGERY     torn ACL  . KNEE ARTHROSCOPY      Social History   Social History  . Marital status: Married    Spouse name: [name]  . Number of children: 1  . Years of education: 40   Occupational History  . dog groomer     retired   Social History Main Topics  . Smoking status: Current Every Day Smoker    Packs/day: 0.50    Years:  20.00    Types: Cigarettes    Start date: 09/28/1963  . Smokeless tobacco: Never Used  . Alcohol use 6.0 oz/week    10 Cans of beer per week     Comment: 1-2 beers a day  . Drug use: No  . Sexual activity: Yes    Birth control/ protection: Post-menopausal   Other Topics Concern  . Not on file   Social History Narrative   Retired Air traffic controller   Lives with husband [name]   Daughter Marcelino Scot is in [place] to walk    Family History  Problem Relation Age of Onset  . Cancer Father     died in 26's everywhere  . Heart disease Mother   . Cancer Mother     lung  . Stroke Mother   . Asthma Daughter     Review of Systems  Constitutional: Negative for chills, fever and weight loss.  HENT: Negative for congestion and hearing loss.   Eyes: Negative for blurred vision and pain.  Respiratory: Negative for cough and shortness of breath.   Cardiovascular: Negative for chest pain and leg swelling.  Gastrointestinal: Negative for abdominal pain, constipation, diarrhea and heartburn.  Genitourinary: Negative for dysuria and frequency.  Musculoskeletal: Positive for joint pain. Negative for falls and myalgias.       Rare pain L knee  Neurological: Negative for dizziness, seizures and headaches.  Psychiatric/Behavioral: Negative for depression. The patient is not nervous/anxious and does  not have insomnia.     BP (!) 144/84   Pulse 88   Temp 97.1 F (36.2 C) (Temporal)   Resp 16   Ht '5\' 5"'$  (1.651 m)   Wt 144 lb 1.3 oz (65.4 kg)   SpO2 100%   BMI 23.98 kg/m   Physical Exam  Constitutional: She is oriented to person, place, and time. She appears well-developed and well-nourished. No distress.  HENT:  Head: Normocephalic and atraumatic.  Right Ear: External ear normal.  Left Ear: External ear normal.  Nose: Nose normal.  Mouth/Throat: Oropharynx is clear and moist.  Eyes: Conjunctivae are normal. Pupils are equal, round, and reactive to light.  Neck: Normal range of motion. Neck  supple. No thyromegaly present.  Cardiovascular: Normal rate, regular rhythm and normal heart sounds.   Pulmonary/Chest: Effort normal and breath sounds normal. No respiratory distress.  Abdominal: Soft. Bowel sounds are normal.  Musculoskeletal: Normal range of motion. She exhibits no edema.  L knee with valgus deformity.  Both feet with varus deformities.  Hematoma R calf - healing  Lymphadenopathy:    She has no cervical adenopathy.  Neurological: She is alert and oriented to person, place, and time.  Gait mildly antalgic  Skin: Skin is warm and dry. Capillary refill takes 2 to 3 seconds.  Violaceous nose.  Hands hyperemic.  Delayed cap refill. Cold to touch.  Psychiatric: She has a normal mood and affect. Her behavior is normal. Thought content normal.  Nursing note and vitals reviewed.  ASSESSMENT/PLAN:  1. Encounter to establish care with new doctor  - CBC - Comprehensive metabolic panel - Lipid panel - VITAMIN D 25 Hydroxy (Vit-D Deficiency, Fractures) - Urinalysis, Routine w reflex microscopic  2. Tobacco abuse Advised to QUIT  3. Screen for colon cancer  - Cologuard  4. Asymptomatic PVD (peripheral vascular disease) ([place]) May have vasospasm - raynauds in hands vs decreases circ. From smoking.    5. Influenza vaccine refused    Patient Instructions  Try to quit smoking  I will order a cologuard  Need blood work I will send you a letter with your test results.  If there is anything of concern, we will call right away.  Walk every day that you are able  See me yearly  See about insurance approval for a shingles shot   Raylene Everts, MD

## 2016-12-15 ENCOUNTER — Encounter: Payer: Self-pay | Admitting: Family Medicine

## 2016-12-15 LAB — LIPID PANEL
CHOL/HDL RATIO: 2 ratio (ref <5.0)
CHOLESTEROL: 172 mg/dL (ref <200)
HDL: 85 mg/dL (ref >50)
LDL Cholesterol: 74 mg/dL (ref <100)
TRIGLYCERIDES: 65 mg/dL (ref <150)
VLDL: 13 mg/dL (ref <30)

## 2016-12-15 LAB — URINALYSIS, ROUTINE W REFLEX MICROSCOPIC
Bilirubin Urine: NEGATIVE
GLUCOSE, UA: NEGATIVE
HGB URINE DIPSTICK: NEGATIVE
KETONES UR: NEGATIVE
LEUKOCYTES UA: NEGATIVE
Nitrite: NEGATIVE
PH: 7 (ref 5.0–8.0)
Protein, ur: NEGATIVE
SPECIFIC GRAVITY, URINE: 1.008 (ref 1.001–1.035)

## 2016-12-15 LAB — COMPREHENSIVE METABOLIC PANEL
ALBUMIN: 4.3 g/dL (ref 3.6–5.1)
ALK PHOS: 49 U/L (ref 33–130)
ALT: 13 U/L (ref 6–29)
AST: 19 U/L (ref 10–35)
BILIRUBIN TOTAL: 0.6 mg/dL (ref 0.2–1.2)
BUN: 9 mg/dL (ref 7–25)
CALCIUM: 9.6 mg/dL (ref 8.6–10.4)
CO2: 27 mmol/L (ref 20–31)
Chloride: 96 mmol/L — ABNORMAL LOW (ref 98–110)
Creat: 0.5 mg/dL (ref 0.50–0.99)
GLUCOSE: 109 mg/dL — AB (ref 65–99)
POTASSIUM: 4.8 mmol/L (ref 3.5–5.3)
Sodium: 132 mmol/L — ABNORMAL LOW (ref 135–146)
Total Protein: 6.7 g/dL (ref 6.1–8.1)

## 2016-12-15 LAB — VITAMIN D 25 HYDROXY (VIT D DEFICIENCY, FRACTURES): VIT D 25 HYDROXY: 74 ng/mL (ref 30–100)

## 2017-06-06 ENCOUNTER — Other Ambulatory Visit: Payer: BLUE CROSS/BLUE SHIELD | Admitting: Adult Health

## 2017-06-15 ENCOUNTER — Ambulatory Visit (INDEPENDENT_AMBULATORY_CARE_PROVIDER_SITE_OTHER): Payer: BLUE CROSS/BLUE SHIELD | Admitting: Adult Health

## 2017-06-15 ENCOUNTER — Encounter: Payer: Self-pay | Admitting: Adult Health

## 2017-06-15 VITALS — BP 100/60 | HR 72 | Ht 63.0 in | Wt 142.0 lb

## 2017-06-15 DIAGNOSIS — Z01419 Encounter for gynecological examination (general) (routine) without abnormal findings: Secondary | ICD-10-CM

## 2017-06-15 DIAGNOSIS — Z1211 Encounter for screening for malignant neoplasm of colon: Secondary | ICD-10-CM | POA: Diagnosis not present

## 2017-06-15 DIAGNOSIS — Z1212 Encounter for screening for malignant neoplasm of rectum: Secondary | ICD-10-CM | POA: Diagnosis not present

## 2017-06-15 LAB — HEMOCCULT GUIAC POC 1CARD (OFFICE): FECAL OCCULT BLD: NEGATIVE

## 2017-06-15 NOTE — Progress Notes (Signed)
Patient ID: Cynthia Kelley, female   DOB: 1955-11-14, 61 y.o.   MRN: [id number] History of Present Illness: Cynthia Kelley is a 61 year old white female, married in for well woman gyn exam, she had normal pap with negative HPV 05/22/15. PCP is Dr Meda Coffee.    Current Medications, Allergies, Past Medical History, Past Surgical History, Family History and Social History were reviewed in [employer] record.     Review of Systems: Patient denies any headaches, hearing loss, fatigue, blurred vision, shortness of breath, chest pain, abdominal pain, problems with bowel movements, urination, or intercourse. No joint pain or mood swings.    Physical Exam:BP 100/60 (BP Location: Left Arm, Patient Position: Sitting, Cuff Size: Small)   Pulse 72   Ht 5\' 3"  (1.6 m)   Wt 142 lb (64.4 kg)   BMI 25.15 kg/m  General:  Well developed, well nourished, no acute distress Skin:  Warm and dry Neck:  Midline trachea, normal thyroid, good ROM, no lymphadenopathy,no carotid bruits heard Lungs; Clear to auscultation bilaterally Breast:  No dominant palpable mass, retraction, or nipple discharge Cardiovascular: Regular rate and rhythm Abdomen:  Soft, non tender, no hepatosplenomegaly Pelvic:  External genitalia is normal in appearance, no lesions.  The vagina is normal in appearance. Urethra has no lesions or masses. The cervix is smooth.  Uterus is felt to be normal size, shape, and contour.  No adnexal masses or tenderness noted.Bladder is non tender, no masses felt. Rectal: Good sphincter tone, no polyps, or hemorrhoids felt.  Hemoccult negative. Extremities/musculoskeletal:  No swelling or varicosities noted, no clubbing, has some discoloration right lower leg, where had injury in March  Psych:  No mood changes, alert and cooperative,seems happy PHQ 2 score 0.  Impression:  1. Well woman exam with routine gynecological exam   2. Screening for colorectal cancer      Plan: Pap and physical in 1  year Mammogram yearly Labs with PCP To do cologard

## 2017-06-15 NOTE — Patient Instructions (Addendum)
Pap and physical in 1 year Mammogram yearly Labs with PCP To go Cologard

## 2017-07-29 ENCOUNTER — Other Ambulatory Visit: Payer: Self-pay

## 2017-07-29 ENCOUNTER — Ambulatory Visit (INDEPENDENT_AMBULATORY_CARE_PROVIDER_SITE_OTHER): Payer: BLUE CROSS/BLUE SHIELD | Admitting: Family Medicine

## 2017-07-29 ENCOUNTER — Encounter: Payer: Self-pay | Admitting: Family Medicine

## 2017-07-29 VITALS — BP 140/78 | HR 91 | Resp 15 | Ht 63.0 in | Wt 140.0 lb

## 2017-07-29 DIAGNOSIS — R601 Generalized edema: Secondary | ICD-10-CM | POA: Diagnosis not present

## 2017-07-29 DIAGNOSIS — I499 Cardiac arrhythmia, unspecified: Secondary | ICD-10-CM

## 2017-07-29 DIAGNOSIS — Z1211 Encounter for screening for malignant neoplasm of colon: Secondary | ICD-10-CM

## 2017-07-29 DIAGNOSIS — G5601 Carpal tunnel syndrome, right upper limb: Secondary | ICD-10-CM

## 2017-07-29 MED ORDER — FUROSEMIDE 20 MG PO TABS: 20.0000 mg | ORAL_TABLET | Freq: Every day | ORAL | 3 refills | Status: DC

## 2017-07-29 NOTE — Progress Notes (Signed)
Chief Complaint  Patient presents with  . hand swelling    swelling in fingers x 2 weeks. They keep her up some nights aching and the swelling is worse in the am    Patient is here for an acute visit.  She complains of hand swelling.  She states she is not having any swelling anywhere else, abdomen, face, or ankles.  She is noticed that for the last couple of weeks.  It is worse in the morning and better throughout the day.  Her hands are very stiff and uncomfortable when she first wakes up.  At times she gets numbness in her right hand.  She suspects she has carpal tunnel syndrome in her right hand.  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  She has no numbness in her left hand.  She has no cough shortness of breath or chest pain.  No prior history of any edema.  No change in her diet.  She endorses little salt intake.  No new medicines or supplements.  Sometimes her fingertips look purple.  This is not specifically when she is cold. She continues to smoke cigarettes. She continues to drink alcohol on a daily basis.  Patient Active Problem List   Diagnosis Date Noted  . Irregular heartbeat 07/29/2017  . Tobacco abuse 12/14/2016  . Asymptomatic PVD (peripheral vascular disease) ([place]) 12/14/2016  . Influenza vaccine refused 12/14/2016  . Osteopenia 05/15/2013  . KNEE, ARTHRITIS, DEGEN./OSTEO 05/11/2010  . FRACTURE, TIBIAL PLATEAU 05/11/2010    Outpatient Encounter Prescriptions as of 07/29/2017  Medication Sig  . furosemide (LASIX) 20 MG tablet Take 1 tablet (20 mg total) by mouth daily.   No facility-administered encounter medications on file as of 07/29/2017.     No Known Allergies  Review of Systems  Constitutional: Negative for activity change, appetite change, fatigue and unexpected weight change.  HENT: Negative for congestion, dental problem, postnasal drip and rhinorrhea.   Eyes: Negative for redness and visual disturbance.  Respiratory: Negative for  cough and shortness of breath.   Cardiovascular: Negative for chest pain, palpitations and leg swelling.  Gastrointestinal: Negative for abdominal pain, constipation and diarrhea.  Genitourinary: Negative for difficulty urinating and frequency.  Musculoskeletal: Negative for arthralgias and back pain.  Skin: Positive for color change.       Swelling of hands, discolored fingers  Neurological: Positive for numbness. Negative for dizziness and headaches.       Numbness right hand, especially at night  Psychiatric/Behavioral: Negative for dysphoric mood and sleep disturbance. The patient is not nervous/anxious.     BP 140/78   Pulse 91   Resp 15   Ht 5\' 3"  (1.6 m)   Wt 140 lb (63.5 kg)   SpO2 98%   BMI 24.80 kg/m   Physical Exam  Constitutional: She is oriented to person, place, and time. She appears well-developed and well-nourished. No distress.  HENT:  Head: Normocephalic and atraumatic.  Right Ear: External ear normal.  Left Ear: External ear normal.  Nose: Nose normal.  Mouth/Throat: Oropharynx is clear and moist.  Eyes: Pupils are equal, round, and reactive to light. Conjunctivae are normal.  Neck: Normal range of motion. Neck supple. No thyromegaly present.  Cardiovascular: Normal rate, normal heart sounds and intact distal pulses.   Heart sounds irregularly irregular on exam.  60 seconds of auscultation.  Spells of regular rhythm.  On EKG patient is in sinus rhythm with  abberent PACs  Pulmonary/Chest: Effort normal and breath sounds normal. No respiratory distress.  Abdominal: Soft. Bowel sounds are normal.  Musculoskeletal: Normal range of motion. She exhibits edema.  L knee with valgus deformity.  Both feet with varus deformities.  Patient has pitting edema to the mid shin  Lymphadenopathy:    She has no cervical adenopathy.  Neurological: She is alert and oriented to person, place, and time.  Gait mildly antalgic.  Positive Phalen's and Tinel's right hand.  Skin:  Skin is warm and dry. Capillary refill takes 2 to 3 seconds.  Violaceous nose.  Hands hyperemic.  Delayed cap refill.  Hands with mild visible edema fingers  Psychiatric: She has a normal mood and affect. Her behavior is normal. Thought content normal.  Nursing note and vitals reviewed.   ASSESSMENT/PLAN:  1. Generalized edema Unclear etiology.  Edema of hands is somewhat worrisome.  No other signs of fluid overload such as JVD or rales.  EKG is normal with few PACs - COMPLETE METABOLIC PANEL WITH GFR - TSH - Urinalysis, Routine w reflex microscopic - CBC - B Nat Peptide  2. Screening for colon cancer Patient request - Cologuard  3. Carpal tunnel syndrome, right History of.  Patient does not want referral for nerve conduction studies 4.  Irregular heartbeat EKG done  Patient Instructions  Take fluid pill daily. Avoid salt in diet. Get lab test today. Call Monday for results.   Raylene Everts, MD

## 2017-07-29 NOTE — Patient Instructions (Signed)
Take fluid pill daily. Avoid salt in diet. Get lab test today. Call Monday for results.

## 2017-07-30 LAB — CBC
HEMATOCRIT: 37.8 % (ref 35.0–45.0)
Hemoglobin: 12.7 g/dL (ref 11.7–15.5)
MCH: 28.3 pg (ref 27.0–33.0)
MCHC: 33.6 g/dL (ref 32.0–36.0)
MCV: 84.2 fL (ref 80.0–100.0)
MPV: 9.7 fL (ref 7.5–12.5)
PLATELETS: 614 10*3/uL — AB (ref 140–400)
RBC: 4.49 10*6/uL (ref 3.80–5.10)
RDW: 12.6 % (ref 11.0–15.0)
WBC: 11.8 10*3/uL — ABNORMAL HIGH (ref 3.8–10.8)

## 2017-07-30 LAB — TSH: TSH: 1.27 m[IU]/L (ref 0.40–4.50)

## 2017-07-30 LAB — COMPLETE METABOLIC PANEL WITH GFR
AG Ratio: 1.2 (calc) (ref 1.0–2.5)
ALBUMIN MSPROF: 3.5 g/dL — AB (ref 3.6–5.1)
ALKALINE PHOSPHATASE (APISO): 113 U/L (ref 33–130)
ALT: 16 U/L (ref 6–29)
AST: 16 U/L (ref 10–35)
BUN / CREAT RATIO: 11 (calc) (ref 6–22)
BUN: 4 mg/dL — AB (ref 7–25)
CALCIUM: 9.3 mg/dL (ref 8.6–10.4)
CO2: 27 mmol/L (ref 20–32)
CREATININE: 0.37 mg/dL — AB (ref 0.50–0.99)
Chloride: 94 mmol/L — ABNORMAL LOW (ref 98–110)
GFR, EST AFRICAN AMERICAN: 132 mL/min/{1.73_m2} (ref >=60)
GFR, EST NON AFRICAN AMERICAN: 114 mL/min/{1.73_m2} (ref >=60)
GLOBULIN: 3 g/dL (ref 1.9–3.7)
GLUCOSE: 97 mg/dL (ref 65–139)
Potassium: 5 mmol/L (ref 3.5–5.3)
SODIUM: 131 mmol/L — AB (ref 135–146)
TOTAL PROTEIN: 6.5 g/dL (ref 6.1–8.1)
Total Bilirubin: 0.6 mg/dL (ref 0.2–1.2)

## 2017-07-30 LAB — URINALYSIS, ROUTINE W REFLEX MICROSCOPIC
BACTERIA UA: NONE SEEN /HPF
Bilirubin Urine: NEGATIVE
Glucose, UA: NEGATIVE
HGB URINE DIPSTICK: NEGATIVE
HYALINE CAST: NONE SEEN /LPF
KETONES UR: NEGATIVE
Nitrite: NEGATIVE
PH: 7 (ref 5.0–8.0)
PROTEIN: NEGATIVE
RBC / HPF: NONE SEEN /HPF (ref 0–2)
Specific Gravity, Urine: 1.001 (ref 1.001–1.03)

## 2017-07-30 LAB — BRAIN NATRIURETIC PEPTIDE: Brain Natriuretic Peptide: 142 pg/mL — ABNORMAL HIGH (ref <100)

## 2017-08-02 ENCOUNTER — Encounter: Payer: Self-pay | Admitting: Family Medicine

## 2017-08-15 ENCOUNTER — Telehealth: Payer: Self-pay | Admitting: *Deleted

## 2017-08-15 NOTE — Telephone Encounter (Signed)
Patient called to discuss the medication she is on for swelling. Please advise [phone]

## 2017-08-16 NOTE — Telephone Encounter (Signed)
Called pt, made appt for 11 [phone] 18, pt did not feel she needed to be seen before that because she is going out of town early tomorrow am.

## 2017-08-16 NOTE — Telephone Encounter (Signed)
Spoke to Cynthia Kelley states the lasix didn't help her edema, c/o fatigue, and not feeling well. Would you like me to have her come in to see you?

## 2017-08-16 NOTE — Telephone Encounter (Signed)
Need to see  Her again

## 2017-08-23 ENCOUNTER — Ambulatory Visit: Payer: BLUE CROSS/BLUE SHIELD | Admitting: Family Medicine

## 2017-09-06 ENCOUNTER — Other Ambulatory Visit: Payer: Self-pay

## 2017-09-06 ENCOUNTER — Ambulatory Visit (INDEPENDENT_AMBULATORY_CARE_PROVIDER_SITE_OTHER): Payer: BLUE CROSS/BLUE SHIELD | Admitting: Family Medicine

## 2017-09-06 ENCOUNTER — Encounter: Payer: Self-pay | Admitting: Family Medicine

## 2017-09-06 VITALS — BP 128/78 | HR 100 | Temp 97.8°F | Resp 16 | Ht 63.0 in | Wt 132.1 lb

## 2017-09-06 DIAGNOSIS — R634 Abnormal weight loss: Secondary | ICD-10-CM

## 2017-09-06 DIAGNOSIS — R601 Generalized edema: Secondary | ICD-10-CM

## 2017-09-06 DIAGNOSIS — R6881 Early satiety: Secondary | ICD-10-CM | POA: Diagnosis not present

## 2017-09-06 DIAGNOSIS — R160 Hepatomegaly, not elsewhere classified: Secondary | ICD-10-CM

## 2017-09-06 DIAGNOSIS — R1013 Epigastric pain: Secondary | ICD-10-CM | POA: Diagnosis not present

## 2017-09-06 NOTE — Progress Notes (Addendum)
Chief Complaint  Patient presents with  . Edema    hands, feet since sept   Patient is back for follow-up She is still experiencing swelling in her hands and feet, hands greater than feet.  She still is losing weight.  Her appetite is poor.  She states when she starts to eat she cannot finish a meal.  She is consistently losing weight.  She also feels quite tired.  I reviewed with her blood work from last visit.  She has thrombocytosis and a millimeter elevated white blood count.  These are nonspecific findings.  She also had a slightly low albumin. Patient continues to smoke cigarettes. She drinks, to excess.  She states a 12 pack lasts her "3-4 days".  She drinks beer every day 3-4 beers.  She denies any problems from the alcohol, any gastritis or GI distress.  She is never been told she had elevated liver functions or cirrhosis. I have concerns regarding the onset of these of vague symptoms and findings.  They could be anything from inflammation, infection, metabolic disease to malignancy.  The weight loss is especially concerning. She denies any headache, sore throat, difficulty chewing or swallowing.  No lung symptoms or difficulty breathing wheezing cough.  No rapid heartbeats chest pain or pressure.  No abdominal symptoms, no heartburn or stomach pain.  Regular bowels.  No urinary complaints.  No joint aches. I gave her a prescription for Lasix to try for the excess fluid.  She took 1 a day with no improvement, took 2 a day no improvement, so stopped the medication.  Patient Active Problem List   Diagnosis Date Noted  . Irregular heartbeat 07/29/2017  . Tobacco abuse 12/14/2016  . Asymptomatic PVD (peripheral vascular disease) ([place]) 12/14/2016  . Influenza vaccine refused 12/14/2016  . Osteopenia 05/15/2013  . KNEE, ARTHRITIS, DEGEN./OSTEO 05/11/2010  . FRACTURE, TIBIAL PLATEAU 05/11/2010    Outpatient Encounter Medications as of 09/06/2017  Medication Sig  . furosemide (LASIX)  20 MG tablet Take 1 tablet (20 mg total) by mouth daily. (Patient not taking: Reported on 09/06/2017)   No facility-administered encounter medications on file as of 09/06/2017.     No Known Allergies  Review of Systems  Constitutional: Positive for appetite change. Negative for activity change, fatigue and unexpected weight change.  HENT: Negative for congestion, dental problem, postnasal drip and rhinorrhea.   Eyes: Negative for redness and visual disturbance.  Respiratory: Negative for cough and shortness of breath.   Cardiovascular: Positive for leg swelling. Negative for chest pain and palpitations.       Trace edema in feet and ankles  Gastrointestinal: Negative for abdominal pain, constipation and diarrhea.       Easy satiety  Genitourinary: Negative for difficulty urinating and frequency.  Musculoskeletal: Negative for arthralgias and back pain.  Skin: Positive for color change.       Swelling of hands, discolored fingers  Neurological: Positive for numbness. Negative for dizziness and headaches.       Numbness right hand, especially at night  Psychiatric/Behavioral: Negative for dysphoric mood and sleep disturbance. The patient is not nervous/anxious.     BP 128/78 (BP Location: Right Arm, Patient Position: Sitting, Cuff Size: Normal)   Pulse 100   Temp 97.8 F (36.6 C) (Temporal)   Resp 16   Ht 5\' 3"  (1.6 m)   Wt 132 lb 1.9 oz (59.9 kg)   SpO2 97%   BMI 23.40 kg/m   Physical Exam  Constitutional: She  is oriented to person, place, and time. She appears well-developed and well-nourished. No distress.  HENT:  Head: Normocephalic and atraumatic.  Right Ear: External ear normal.  Left Ear: External ear normal.  Nose: Nose normal.  Mouth/Throat: Oropharynx is clear and moist.  Eyes: Conjunctivae are normal. Pupils are equal, round, and reactive to light.  Neck: Normal range of motion. Neck supple. No thyromegaly present.  Cardiovascular: Normal rate, normal heart  sounds and intact distal pulses.  Heart sounds regular on exam.  60 seconds of auscultation.  Frequent ectopy  Pulmonary/Chest: Effort normal and breath sounds normal. No respiratory distress.  Abdominal: Soft. Bowel sounds are normal. There is hepatomegaly.  Liver edge palpable 3 cm below right costal margin.  Nontender.  Spleen is not palpable.  Mid epigastric tenderness to deep pressure.  Musculoskeletal: Normal range of motion. She exhibits edema.  L knee with valgus deformity.  Both feet with varus deformities.  Patient has trace pitting edema pitting edema  Lymphadenopathy:    She has no cervical adenopathy.  Neurological: She is alert and oriented to person, place, and time.  Gait mildly antalgic.  Positive Phalen's and Tinel's right hand.  Skin: Skin is warm and dry. Capillary refill takes 2 to 3 seconds.  Violaceous nose.  Hands mildly hyperemic.  Hands with mild visible edema fingers  Psychiatric: She has a normal mood and affect. Her behavior is normal. Thought content normal.  Nursing note and vitals reviewed.   ASSESSMENT/PLAN:  1. Weight loss, non-intentional  - CBC with Differential/Platelet - COMPLETE METABOLIC PANEL WITH GFR - TSH  2. Early satiety   3. Hepatomegaly   4. Generalized edema   5. Epigastric pain  - CT Abdomen Pelvis W Contrast; Future  With weight loss and hepatomegaly, I am concerned about malignancy.  I feel investigation for hepatic malignancy and or metaststic disease is needed.she is also demonstrating progressive anemia.  Addendum 09/12/17 Patient Instructions  Repeat blood test today Need CT scan abdomen I will send you a letter with your test results.  If there is anything of concern, we will call right away.    Raylene Everts, MD

## 2017-09-06 NOTE — Patient Instructions (Signed)
Repeat blood test today Need CT scan abdomen I will send you a letter with your test results.  If there is anything of concern, we will call right away.

## 2017-09-09 LAB — COMPLETE METABOLIC PANEL WITH GFR
AG RATIO: 1.2 (calc) (ref 1.0–2.5)
ALBUMIN MSPROF: 3.5 g/dL — AB (ref 3.6–5.1)
ALT: 12 U/L (ref 6–29)
AST: 14 U/L (ref 10–35)
Alkaline phosphatase (APISO): 123 U/L (ref 33–130)
BILIRUBIN TOTAL: 0.5 mg/dL (ref 0.2–1.2)
BUN / CREAT RATIO: 11 (calc) (ref 6–22)
BUN: 4 mg/dL — AB (ref 7–25)
CALCIUM: 8.9 mg/dL (ref 8.6–10.4)
CHLORIDE: 93 mmol/L — AB (ref 98–110)
CO2: 28 mmol/L (ref 20–32)
Creat: 0.36 mg/dL — ABNORMAL LOW (ref 0.50–0.99)
GFR, EST AFRICAN AMERICAN: 133 mL/min/{1.73_m2} (ref >=60)
GFR, EST NON AFRICAN AMERICAN: 115 mL/min/{1.73_m2} (ref >=60)
Globulin: 3 g/dL (calc) (ref 1.9–3.7)
Glucose, Bld: 110 mg/dL (ref 65–139)
Potassium: 4.3 mmol/L (ref 3.5–5.3)
Sodium: 130 mmol/L — ABNORMAL LOW (ref 135–146)
TOTAL PROTEIN: 6.5 g/dL (ref 6.1–8.1)

## 2017-09-09 LAB — CBC WITH DIFFERENTIAL/PLATELET
BASOS PCT: 0.3 %
Basophils Absolute: 35 cells/uL (ref 0–200)
EOS ABS: 70 {cells}/uL (ref 15–500)
Eosinophils Relative: 0.6 %
HCT: 35 % (ref 35.0–45.0)
HEMOGLOBIN: 11.4 g/dL — AB (ref 11.7–15.5)
LYMPHS ABS: 2216 {cells}/uL (ref 850–3900)
MCH: 26.8 pg — ABNORMAL LOW (ref 27.0–33.0)
MCHC: 32.6 g/dL (ref 32.0–36.0)
MCV: 82.4 fL (ref 80.0–100.0)
MPV: 9.4 fL (ref 7.5–12.5)
Monocytes Relative: 8.8 %
NEUTROS ABS: 8259 {cells}/uL — AB (ref 1500–7800)
Neutrophils Relative %: 71.2 %
PLATELETS: 630 10*3/uL — AB (ref 140–400)
RBC: 4.25 10*6/uL (ref 3.80–5.10)
RDW: 14.3 % (ref 11.0–15.0)
Total Lymphocyte: 19.1 %
WBC: 11.6 10*3/uL — AB (ref 3.8–10.8)
WBCMIX: 1021 {cells}/uL — AB (ref 200–950)

## 2017-09-09 LAB — TSH: TSH: 1.09 mIU/L (ref 0.40–4.50)

## 2017-09-12 ENCOUNTER — Telehealth: Payer: Self-pay | Admitting: Family Medicine

## 2017-09-12 NOTE — Telephone Encounter (Signed)
Left message returning your call  [phone]

## 2017-09-12 NOTE — Telephone Encounter (Signed)
Spoke to [employer], aware insurance has denied her authorization as of right now for her ct, I have faxed additional information to them, per dr Meda Coffee.

## 2017-09-12 NOTE — Telephone Encounter (Signed)
lmtcb

## 2017-09-12 NOTE — Telephone Encounter (Signed)
Tillie Rung calling from [facility] [phone]. She needs a Auth for a CT scan for Pt Cynthia Kelley  Today by 2pm

## 2017-09-13 ENCOUNTER — Ambulatory Visit (HOSPITAL_COMMUNITY): Payer: BLUE CROSS/BLUE SHIELD

## 2017-09-13 ENCOUNTER — Telehealth: Payer: Self-pay | Admitting: Family Medicine

## 2017-09-13 NOTE — Telephone Encounter (Signed)
Can you call Yvett Rossel on [phone]

## 2017-09-14 NOTE — Telephone Encounter (Signed)
Spoke to Cynthia Kelley, has an appt on 12 [phone] 18  at [facility], I have printed order to be faxed.

## 2017-09-14 NOTE — Telephone Encounter (Signed)
thanks

## 2017-09-15 ENCOUNTER — Ambulatory Visit (INDEPENDENT_AMBULATORY_CARE_PROVIDER_SITE_OTHER): Payer: BLUE CROSS/BLUE SHIELD

## 2017-09-15 ENCOUNTER — Telehealth: Payer: Self-pay | Admitting: Family Medicine

## 2017-09-15 ENCOUNTER — Encounter (INDEPENDENT_AMBULATORY_CARE_PROVIDER_SITE_OTHER): Payer: Self-pay

## 2017-09-15 DIAGNOSIS — R350 Frequency of micturition: Secondary | ICD-10-CM

## 2017-09-15 LAB — POCT URINALYSIS DIPSTICK
Glucose, UA: NEGATIVE
KETONES UA: NEGATIVE
NITRITE UA: NEGATIVE
PH UA: 6 (ref 5.0–8.0)
PROTEIN UA: 30
Spec Grav, UA: 1.015 (ref 1.010–1.025)
UROBILINOGEN UA: 0.2 U/dL

## 2017-09-15 NOTE — Telephone Encounter (Signed)
Please call on her cell phone [phone], you have questions regarding the CT scan

## 2017-09-15 NOTE — Telephone Encounter (Signed)
Spoke to [employer], thinks she may have a uti, is coming here around 21 for a u/a.

## 2017-09-16 LAB — URINE CULTURE
MICRO NUMBER:: 81434930
SPECIMEN QUALITY:: ADEQUATE

## 2017-09-19 NOTE — Progress Notes (Signed)
Spoke to Shaianne, aware.

## 2017-09-19 NOTE — Progress Notes (Signed)
Left message to call office

## 2017-09-21 ENCOUNTER — Telehealth: Payer: Self-pay | Admitting: Family Medicine

## 2017-09-21 NOTE — Telephone Encounter (Signed)
Done, and faxed back.

## 2017-09-21 NOTE — Telephone Encounter (Signed)
Sharyn Lull with [facility] is calling. Needs the order changed to Oral or IV or BOTH for the CT scan, Test is being done on Friday. [phone]

## 2017-09-21 NOTE — Telephone Encounter (Signed)
Both.  Please pend

## 2017-09-27 HISTORY — PX: PORTACATH PLACEMENT: SHX2246

## 2017-09-28 ENCOUNTER — Ambulatory Visit: Payer: BLUE CROSS/BLUE SHIELD | Admitting: Family Medicine

## 2017-09-29 ENCOUNTER — Ambulatory Visit (HOSPITAL_COMMUNITY)
Admission: RE | Admit: 2017-09-29 | Discharge: 2017-09-29 | Disposition: A | Discharge status: Home / Self Care | Payer: BLUE CROSS/BLUE SHIELD | Source: Ambulatory Visit | Attending: Family Medicine | Admitting: Family Medicine

## 2017-09-29 ENCOUNTER — Other Ambulatory Visit: Payer: Self-pay

## 2017-09-29 ENCOUNTER — Encounter: Payer: Self-pay | Admitting: Family Medicine

## 2017-09-29 ENCOUNTER — Encounter (HOSPITAL_COMMUNITY): Payer: Self-pay | Admitting: Radiology

## 2017-09-29 ENCOUNTER — Other Ambulatory Visit: Payer: Self-pay | Admitting: Family Medicine

## 2017-09-29 ENCOUNTER — Telehealth: Payer: Self-pay

## 2017-09-29 ENCOUNTER — Telehealth: Payer: Self-pay | Admitting: Family Medicine

## 2017-09-29 DIAGNOSIS — J9811 Atelectasis: Secondary | ICD-10-CM | POA: Insufficient documentation

## 2017-09-29 DIAGNOSIS — R911 Solitary pulmonary nodule: Secondary | ICD-10-CM

## 2017-09-29 DIAGNOSIS — I700 Atherosclerosis of aorta: Secondary | ICD-10-CM | POA: Diagnosis not present

## 2017-09-29 DIAGNOSIS — R918 Other nonspecific abnormal finding of lung field: Secondary | ICD-10-CM | POA: Diagnosis present

## 2017-09-29 DIAGNOSIS — R079 Chest pain, unspecified: Secondary | ICD-10-CM | POA: Diagnosis present

## 2017-09-29 DIAGNOSIS — C787 Secondary malignant neoplasm of liver and intrahepatic bile duct: Secondary | ICD-10-CM

## 2017-09-29 DIAGNOSIS — R160 Hepatomegaly, not elsewhere classified: Secondary | ICD-10-CM

## 2017-09-29 MED ORDER — HYDROCODONE-ACETAMINOPHEN 7.5-325 MG PO TABS: 1.0000 | ORAL_TABLET | Freq: Four times a day (QID) | ORAL | 0 refills | Status: DC | PRN

## 2017-09-29 MED ORDER — IOPAMIDOL (ISOVUE-370) INJECTION 76%
100.0000 mL | Freq: Once | INTRAVENOUS | Status: AC | PRN
  Administered 2017-09-29: 100 mL via INTRAVENOUS

## 2017-09-29 NOTE — Telephone Encounter (Signed)
Pt is calling in for Pain Medicine, please call it into [place] in [place].

## 2017-09-29 NOTE — Progress Notes (Signed)
CT result reviewed.  biopsy ordered.

## 2017-09-29 NOTE — Telephone Encounter (Signed)
I called Cynthia Kelley and asked her to come in to discuss her test results.  She had a CAT scan performed of her abdomen on 09/23/2017.  This was done because of weight loss and hepatomegaly.  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  There is also a 3 cm spiculated mass more peripheral with necrosis.  In addition, unfortunately, there is a liver mass with necrosis suggestive of metastasis. I asked Cynthia Kelley to come to the office soon she is here with her husband Cynthia Kelley.  I presented them the results.  I called Dr. Luan Pulling in pulmonary medicine for advice.  Cynthia Kelley tells me today that she has new onset of left chest pain with deep breath and movement for the last 2 days.  I have concern for pulmonary embolus.  Dr. Luan Pulling recommends stat CT of her chest.  He also recommended that I contact interventional radiology to inquire about biopsy.  This will be ordered today.

## 2017-09-29 NOTE — Telephone Encounter (Signed)
Prior auth # for CT A   [id number].Marland Kitchen    Ap radiology aware   kendra.

## 2017-09-29 NOTE — Telephone Encounter (Signed)
Noted, second request.

## 2017-09-29 NOTE — Telephone Encounter (Signed)
Patients husband [name] called, he said Dr.Nelson was sending in a prescription for patient to [facility] today. I dont see anything  In her chart. Please advise. Cb#: [phone] (husband [facility] cell #)

## 2017-09-29 NOTE — Progress Notes (Unsigned)
Spoke with Dr Meda Coffee regarding Cynthia Kelley's liver lesion bx.  I called radiology scheduling to make sure they see the order in Epic.

## 2017-09-29 NOTE — Telephone Encounter (Signed)
Prior auth done for chest ct # [id number].

## 2017-09-30 ENCOUNTER — Other Ambulatory Visit: Payer: Self-pay | Admitting: Family Medicine

## 2017-09-30 MED ORDER — HYDROCODONE-ACETAMINOPHEN 7.5-325 MG PO TABS: 1.0000 | ORAL_TABLET | Freq: Four times a day (QID) | ORAL | 0 refills | Status: DC | PRN

## 2017-09-30 NOTE — Telephone Encounter (Signed)
Prescription for hydrocodone was sent to the [facility].  I called patient to confirm that she had picked this up and it was helping her.

## 2017-10-03 ENCOUNTER — Telehealth: Payer: Self-pay | Admitting: Family Medicine

## 2017-10-03 NOTE — Telephone Encounter (Signed)
Patient is requesting a call back from dr. Meda Coffee, she has questions about her procedure coming up.

## 2017-10-04 NOTE — Telephone Encounter (Signed)
Jovi had a "list".  We discussed her findings.  Her follow-up.  Her biopsy.  Her medications.  Referrals.  She has spoken with her daughter who wants her to have a second opinion prior to the biopsy.  I told her I think the biopsy is important and that we have already had 2 radiologist confirm she has a liver lesion.  Once we have the biopsy results we can refer her further.

## 2017-10-06 ENCOUNTER — Other Ambulatory Visit: Payer: Self-pay | Admitting: Radiology

## 2017-10-10 ENCOUNTER — Other Ambulatory Visit (HOSPITAL_COMMUNITY): Payer: Self-pay | Admitting: Interventional Radiology

## 2017-10-10 ENCOUNTER — Ambulatory Visit (HOSPITAL_COMMUNITY)
Admission: RE | Admit: 2017-10-10 | Discharge: 2017-10-10 | Disposition: A | Discharge status: Home / Self Care | Payer: BLUE CROSS/BLUE SHIELD | Source: Ambulatory Visit | Attending: Family Medicine | Admitting: Family Medicine

## 2017-10-10 ENCOUNTER — Encounter (HOSPITAL_COMMUNITY): Payer: Self-pay

## 2017-10-10 DIAGNOSIS — Z8249 Family history of ischemic heart disease and other diseases of the circulatory system: Secondary | ICD-10-CM | POA: Insufficient documentation

## 2017-10-10 DIAGNOSIS — Z825 Family history of asthma and other chronic lower respiratory diseases: Secondary | ICD-10-CM | POA: Insufficient documentation

## 2017-10-10 DIAGNOSIS — Z801 Family history of malignant neoplasm of trachea, bronchus and lung: Secondary | ICD-10-CM | POA: Diagnosis not present

## 2017-10-10 DIAGNOSIS — R634 Abnormal weight loss: Secondary | ICD-10-CM | POA: Insufficient documentation

## 2017-10-10 DIAGNOSIS — C787 Secondary malignant neoplasm of liver and intrahepatic bile duct: Secondary | ICD-10-CM | POA: Diagnosis not present

## 2017-10-10 DIAGNOSIS — Z6821 Body mass index (BMI) 21.0-21.9, adult: Secondary | ICD-10-CM | POA: Diagnosis not present

## 2017-10-10 DIAGNOSIS — F17210 Nicotine dependence, cigarettes, uncomplicated: Secondary | ICD-10-CM | POA: Diagnosis not present

## 2017-10-10 DIAGNOSIS — C801 Malignant (primary) neoplasm, unspecified: Secondary | ICD-10-CM | POA: Diagnosis not present

## 2017-10-10 DIAGNOSIS — M810 Age-related osteoporosis without current pathological fracture: Secondary | ICD-10-CM | POA: Diagnosis not present

## 2017-10-10 DIAGNOSIS — R918 Other nonspecific abnormal finding of lung field: Secondary | ICD-10-CM | POA: Insufficient documentation

## 2017-10-10 DIAGNOSIS — M1990 Unspecified osteoarthritis, unspecified site: Secondary | ICD-10-CM | POA: Diagnosis not present

## 2017-10-10 DIAGNOSIS — R079 Chest pain, unspecified: Secondary | ICD-10-CM | POA: Insufficient documentation

## 2017-10-10 DIAGNOSIS — K769 Liver disease, unspecified: Secondary | ICD-10-CM | POA: Diagnosis present

## 2017-10-10 DIAGNOSIS — Z823 Family history of stroke: Secondary | ICD-10-CM | POA: Insufficient documentation

## 2017-10-10 LAB — CBC
HEMATOCRIT: 35.1 % — AB (ref 36.0–46.0)
Hemoglobin: 11.8 g/dL — ABNORMAL LOW (ref 12.0–15.0)
MCH: 26.9 pg (ref 26.0–34.0)
MCHC: 33.6 g/dL (ref 30.0–36.0)
MCV: 80 fL (ref 78.0–100.0)
PLATELETS: 736 10*3/uL — AB (ref 150–400)
RBC: 4.39 MIL/uL (ref 3.87–5.11)
RDW: 15.1 % (ref 11.5–15.5)
WBC: 16.6 10*3/uL — AB (ref 4.0–10.5)

## 2017-10-10 LAB — APTT: aPTT: 39 seconds — ABNORMAL HIGH (ref 24–36)

## 2017-10-10 LAB — PROTIME-INR
INR: 1.07
Prothrombin Time: 13.8 seconds (ref 11.4–15.2)

## 2017-10-10 MED ORDER — GELATIN ABSORBABLE 12-7 MM EX MISC
CUTANEOUS | Status: AC
  Filled 2017-10-10: qty 1

## 2017-10-10 MED ORDER — SODIUM CHLORIDE 0.9 % IV SOLN: INTRAVENOUS | Status: DC

## 2017-10-10 MED ORDER — MIDAZOLAM HCL 2 MG/2ML IJ SOLN
INTRAMUSCULAR | Status: AC
  Filled 2017-10-10: qty 2

## 2017-10-10 MED ORDER — HYDROCODONE-ACETAMINOPHEN 5-325 MG PO TABS: 1.0000 | ORAL_TABLET | ORAL | Status: DC | PRN

## 2017-10-10 MED ORDER — FENTANYL CITRATE (PF) 100 MCG/2ML IJ SOLN
INTRAMUSCULAR | Status: AC
  Filled 2017-10-10: qty 2

## 2017-10-10 MED ORDER — MIDAZOLAM HCL 2 MG/2ML IJ SOLN
INTRAMUSCULAR | Status: AC | PRN
  Administered 2017-10-10 (×2): 1 mg via INTRAVENOUS

## 2017-10-10 MED ORDER — LIDOCAINE HCL (PF) 1 % IJ SOLN
INTRAMUSCULAR | Status: AC
  Administered 2017-10-10: 10 mL
  Filled 2017-10-10: qty 30

## 2017-10-10 MED ORDER — FENTANYL CITRATE (PF) 100 MCG/2ML IJ SOLN
INTRAMUSCULAR | Status: AC | PRN
  Administered 2017-10-10 (×2): 50 ug via INTRAVENOUS

## 2017-10-10 NOTE — Procedures (Signed)
  Procedure: US liver lesion core bx 18g x3  EBL:   minimal Complications:  none immediate  See full dictation in [facility].  Dillard Cannon MD Main # [phone] Pager  [phone]

## 2017-10-10 NOTE — Discharge Instructions (Addendum)
Liver Biopsy, Care After °Refer to this sheet in the next few weeks. These instructions provide you with information on caring for yourself after your procedure. Your health care provider may also give you more specific instructions. Your treatment has been planned according to current medical practices, but problems sometimes occur. Call your health care provider if you have any problems or questions after your procedure. °What can I expect after the procedure? °After your procedure, it is typical to have the following: °· A small amount of discomfort in the area where the biopsy was done and in the right shoulder or shoulder blade. °· A small amount of bruising around the area where the biopsy was done and on the skin over the liver. °· Sleepiness and fatigue for the rest of the day. ° °Follow these instructions at home: °· Rest at home for 1-2 days or as directed by your health care provider. °· Have a friend or family member stay with you for at least 24 hours. °· Because of the medicines used during the procedure, you should not do the following things in the first 24 hours: °? Drive. °? Use machinery. °? Be responsible for the care of other people. °? Sign legal documents. °? Take a bath or shower. °· There are many different ways to close and cover an incision, including stitches, skin glue, and adhesive strips. Follow your health care provider's instructions on: °? Incision care. °? Bandage (dressing) changes and removal. °? Incision closure removal. °· Do not drink alcohol in the first week. °· Do not lift more than 5 pounds or play contact sports for 2 weeks after this test. °· Take medicines only as directed by your health care provider. Do not take medicine containing aspirin or non-steroidal anti-inflammatory medicines such as ibuprofen for 1 week after this test. °· It is your responsibility to get your test results. °Contact a health care provider if: °· You have increased bleeding from an incision  that results in more than a small spot of blood. °· You have redness, swelling, or increasing pain in any incisions. °· You notice a discharge or a bad smell coming from any of your incisions. °· You have a fever or chills. °Get help right away if: °· You develop swelling, bloating, or pain in your abdomen. °· You become dizzy or faint. °· You develop a rash. °· You are nauseous or vomit. °· You have difficulty breathing, feel short of breath, or feel faint. °· You develop chest pain. °· You have problems with your speech or vision. °· You have trouble balancing or moving your arms or legs. °This information is not intended to replace advice given to you by your health care provider. Make sure you discuss any questions you have with your health care provider. °Document Released: 04/02/2005 Document Revised: 02/19/2016 Document Reviewed: 11/09/2013 °Elsevier Interactive Patient Education © 2018 [employer] °Moderate Conscious Sedation, Adult, Care After °These instructions provide you with information about caring for yourself after your procedure. Your health care provider may also give you more specific instructions. Your treatment has been planned according to current medical practices, but problems sometimes occur. Call your health care provider if you have any problems or questions after your procedure. °What can I expect after the procedure? °After your procedure, it is common: °· To feel sleepy for several hours. °· To feel clumsy and have poor balance for several hours. °· To have poor judgment for several hours. °· To vomit if you eat   too soon. ° °Follow these instructions at home: °For at least 24 hours after the procedure: ° °· Do not: °? Participate in activities where you could fall or become injured. °? Drive. °? Use heavy machinery. °? Drink alcohol. °? Take sleeping pills or medicines that cause drowsiness. °? Make important decisions or sign legal documents. °? Take care of children on your  own. °· Rest. °Eating and drinking °· Follow the diet recommended by your health care provider. °· If you vomit: °? Drink water, juice, or soup when you can drink without vomiting. °? Make sure you have little or no nausea before eating solid foods. °General instructions °· Have a responsible adult stay with you until you are awake and alert. °· Take over-the-counter and prescription medicines only as told by your health care provider. °· If you smoke, do not smoke without supervision. °· Keep all follow-up visits as told by your health care provider. This is important. °Contact a health care provider if: °· You keep feeling nauseous or you keep vomiting. °· You feel light-headed. °· You develop a rash. °· You have a fever. °Get help right away if: °· You have trouble breathing. °This information is not intended to replace advice given to you by your health care provider. Make sure you discuss any questions you have with your health care provider. °Document Released: 07/04/2013 Document Revised: 02/16/2016 Document Reviewed: 01/03/2016 °Elsevier Interactive Patient Education © 2018 [employer] ° °

## 2017-10-10 NOTE — Sedation Documentation (Signed)
Patient is resting comfortably. 

## 2017-10-10 NOTE — H&P (Signed)
Chief Complaint: Patient was seen in consultation today for liver lesion biopsy at the request of Raylene Everts  Referring Physician(s): Raylene Everts  Supervising Physician: Arne Cleveland  Patient Status: [facility] - Out-pt  History of Present Illness: Cynthia Kelley is a 62 y.o. female   Left chest pain for weeks Continued unintentional wt loss +smoker; + ETOH  CT 09/29/17: IMPRESSION: No evidence of pulmonary embolism. Compression of RIGHT lower lobe pulmonary arteries centrally by significant adenopathy at RIGHT hilum. RIGHT lower lobe cavitary mass with significant RIGHT hilar and subcarinal adenopathy. Peripheral atelectasis in RIGHT lower lobe. Nonspecific RIGHT upper lobe nodules. Two liver lesions consistent with hepatic metastases as noted on prior outside CT exam. Two LEFT upper quadrant soft tissue nodules question metastases.  Scheduled now for liver lesion biopsy  Past Medical History:  Diagnosis Date  . Arthritis   . Osteopenia 05/15/2013  . Osteoporosis    osteopenia  . Tobacco abuse 12/14/2016    Past Surgical History:  Procedure Laterality Date  . FRACTURE SURGERY     torn ACL  . KNEE ARTHROSCOPY      Allergies: Patient has no known allergies.  Medications: Prior to Admission medications   Medication Sig Start Date End Date Taking? Authorizing Provider  calcium carbonate (OS-CAL - DOSED IN MG OF ELEMENTAL CALCIUM) 1250 (500 Ca) MG tablet Take 1 tablet by mouth 3 (three) times daily with meals.   Yes [provider]  diphenhydrAMINE (BENADRYL) 25 mg capsule Take 50 mg by mouth at bedtime as needed for sleep.   Yes [provider]  HYDROcodone-acetaminophen (NORCO) 7.5-325 MG tablet Take 1 tablet by mouth every 6 (six) hours as needed for moderate pain. 09/30/17  Yes Raylene Everts, MD  ibuprofen (ADVIL,MOTRIN) 200 MG tablet Take 600-800 mg by mouth every 8 (eight) hours as needed for mild pain or moderate pain.   Yes  [provider]  Multiple Vitamins-Minerals (MULTIVITAMIN WITH MINERALS) tablet Take 1 tablet by mouth daily.   Yes [provider]     Family History  Problem Relation Age of Onset  . Cancer Father        died in 54's everywhere  . Heart disease Mother   . Cancer Mother        lung  . Stroke Mother   . Asthma Daughter     Social History   Socioeconomic History  . Marital status: Married    Spouse name: [name]  . Number of children: 1  . Years of education: 44  . Highest education level: None  Social Needs  . Financial resource strain: None  . Food insecurity - worry: None  . Food insecurity - inability: None  . Transportation needs - medical: None  . Transportation needs - non-medical: None  Occupational History  . Occupation: Air traffic controller    Comment: retired  Tobacco Use  . Smoking status: Current Every Day Smoker    Packs/day: 0.50    Years: 20.00    Pack years: 10.00    Types: Cigarettes    Start date: 09/28/1963  . Smokeless tobacco: Never Used  Substance and Sexual Activity  . Alcohol use: Yes    Alcohol/week: 12.0 oz    Types: 20 Cans of beer per week    Comment: 3-4 beers a day  . Drug use: No  . Sexual activity: Yes    Birth control/protection: Post-menopausal  Other Topics Concern  . None  Social History Narrative   Retired Magazine features editor  groomer   Lives with husband Delanna Notice is in [place] to walk     Review of Systems: A 12 point ROS discussed and pertinent positives are indicated in the HPI above.  All other systems are negative.  Review of Systems  Constitutional: Positive for unexpected weight change. Negative for activity change, appetite change and fatigue.  Respiratory: Negative for cough and shortness of breath.   Cardiovascular: Positive for chest pain.  Gastrointestinal: Negative for abdominal pain.  Neurological: Negative for weakness.  Psychiatric/Behavioral: Negative for behavioral problems and confusion.     Vital Signs: BP (!) 128/59   Pulse 92   Temp 98.3 F (36.8 C) (Oral)   Resp 16   Ht 5\' 5"  (1.651 m)   Wt 132 lb (59.9 kg)   SpO2 96%   BMI 21.97 kg/m   Physical Exam  Constitutional: She is oriented to person, place, and time.  Cardiovascular: Normal rate, regular rhythm and normal heart sounds.  Pulmonary/Chest: Effort normal and breath sounds normal.  Abdominal: Soft. Bowel sounds are normal.  Musculoskeletal: Normal range of motion.  Neurological: She is alert and oriented to person, place, and time.  Skin: Skin is warm and dry.  Psychiatric: She has a normal mood and affect. Her behavior is normal. Judgment and thought content normal.  Nursing note and vitals reviewed.   Imaging: Ct Angio Chest Pe W Or Wo Contrast  Result Date: 09/29/2017 CLINICAL DATA:  New onset LEFT chest pain, known RIGHT lung tumor and liver metastasis, lung nodule EXAM: CT ANGIOGRAPHY CHEST WITH CONTRAST TECHNIQUE: Multidetector CT imaging of the chest was performed using the standard protocol during bolus administration of intravenous contrast. Multiplanar CT image reconstructions and MIPs were obtained to evaluate the vascular anatomy. CONTRAST:  100 ml Isovue 300 IV COMPARISON:  Outside CT abdomen and pelvis from [facility] dated 09/23/2017 FINDINGS: Cardiovascular: Scattered atherosclerotic calcifications aorta and proximal great vessels. Aorta normal caliber without aneurysm or dissection. Pulmonary arteries patent, though compression of RIGHT lower lobe pulmonary arteries by RIGHT hilar adenopathy is identified. No filling defects identified to suggest pulmonary embolism. No pericardial effusion. Mediastinum/Nodes: Significant thoracic adenopathy including: 2.6 cm short axis subcarinal node image 46 and multiple RIGHT hilar nodes including 17 mm short axis and 15 mm short axis image 51 and an inferior RIGHT hilar node 2.0 cm image 54 (previously 1.7 cm), noted on prior CT, Lungs/Pleura:  Cavitary mass RIGHT lower lobe 3.8 x 2.9 cm image 90 previously. Two tiny nodules in RIGHT upper lobe images 24 and 39 with an additional 4 mm RIGHT upper lobe nodule image 20. Atelectasis peripheral to the cavitary mass in the RIGHT lower lobe extending to RIGHT lung base. Central peribronchial thickening. Minimal bibasilar atelectasis. No segmental consolidation, pleural effusion or pneumothorax. Upper Abdomen: Poorly defined lesion superior aspect RIGHT lobe liver 3.7 x 3.8 cm consistent with metastasis. Additional liver lesion centrally RIGHT lobe 2.5 x 2.0 cm image 83. Additional probable metastatic lesions in the medial segment LEFT lobe and in the posterior RIGHT lobe less well visualized. These lesions were noted on the prior outside CT. LEFT upper quadrant soft tissue nodules 11 x 10 mm image 83 and 18 x 15 mm image 74 question metastases. Colonic diverticulosis. Stomach incompletely distended. Remaining visualized upper abdomen unremarkable. Musculoskeletal: No acute osseous findings. Review of the MIP images confirms the above findings. IMPRESSION: No evidence of pulmonary embolism. Compression of RIGHT lower lobe pulmonary arteries centrally by  significant adenopathy at RIGHT hilum. RIGHT lower lobe cavitary mass with significant RIGHT hilar and subcarinal adenopathy. Peripheral atelectasis in RIGHT lower lobe. Nonspecific RIGHT upper lobe nodules. Two liver lesions consistent with hepatic metastases as noted on prior outside CT exam. Two LEFT upper quadrant soft tissue nodules question metastases. Aortic Atherosclerosis (ICD10-I70.0). Electronically Signed   By: Lavonia Dana M.D.   On: 09/29/2017 16:11    Labs:  CBC: Recent Labs    12/14/16 1406 07/29/17 1532 09/09/17 1347 10/10/17 0630  WBC 6.5 11.8* 11.6* 16.6*  HGB 13.5 12.7 11.4* 11.8*  HCT 39.8 37.8 35.0 35.1*  PLT 352 614* 630* 736*    COAGS: No results for input(s): INR, APTT in the last 8760 hours.  BMP: Recent Labs     12/14/16 1406 07/29/17 1532 09/09/17 1347  NA 132* 131* 130*  K 4.8 5.0 4.3  CL 96* 94* 93*  CO2 27 27 28   GLUCOSE 109* 97 110  BUN 9 4* 4*  CALCIUM 9.6 9.3 8.9  CREATININE 0.50 0.37* 0.36*  GFRNONAA  --  114 115  GFRAA  --  132 133    LIVER FUNCTION TESTS: Recent Labs    12/14/16 1406 07/29/17 1532 09/09/17 1347  BILITOT 0.6 0.6 0.5  AST 19 16 14   ALT 13 16 12   ALKPHOS 49  --   --   PROT 6.7 6.5 6.5  ALBUMIN 4.3  --   --     TUMOR MARKERS: No results for input(s): AFPTM, CEA, CA199, CHROMGRNA in the last 8760 hours.  Assessment and Plan:  Smoker; ETOH Left chest pain for weeks Unintentional wt loss Ct revealing Rt lung mass; liver lesions Scheduled for liver lesion biopsy Risks and benefits discussed with the patient including, but not limited to bleeding, infection, damage to adjacent structures or low yield requiring additional tests. All of the patient's questions were answered, patient is agreeable to proceed. Consent signed and in chart.   Thank you for this interesting consult.  I greatly enjoyed meeting Cynthia Kelley and look forward to participating in their care.  A copy of this report was sent to the requesting provider on this date.  Electronically Signed: Lavonia Drafts, PA-C 10/10/2017, 7:03 AM   I spent a total of  30 Minutes   in face to face in clinical consultation, greater than 50% of which was counseling/coordinating care for liver lesion biopsy

## 2017-10-11 ENCOUNTER — Encounter: Payer: Self-pay | Admitting: Family Medicine

## 2017-10-13 ENCOUNTER — Other Ambulatory Visit (HOSPITAL_COMMUNITY): Payer: Self-pay | Admitting: Adult Health

## 2017-10-13 ENCOUNTER — Encounter: Payer: Self-pay | Admitting: Family Medicine

## 2017-10-14 ENCOUNTER — Encounter: Payer: Self-pay | Admitting: Family Medicine

## 2017-10-17 ENCOUNTER — Telehealth: Payer: Self-pay | Admitting: Family Medicine

## 2017-10-17 NOTE — Telephone Encounter (Signed)
Eden Emms, calling in to speak with Dr Meda Coffee,  Please call her at home number [phone].

## 2017-10-18 ENCOUNTER — Other Ambulatory Visit: Payer: Self-pay

## 2017-10-18 ENCOUNTER — Encounter: Payer: Self-pay | Admitting: Family Medicine

## 2017-10-18 ENCOUNTER — Ambulatory Visit (INDEPENDENT_AMBULATORY_CARE_PROVIDER_SITE_OTHER): Payer: BLUE CROSS/BLUE SHIELD | Admitting: Family Medicine

## 2017-10-18 VITALS — BP 130/62 | HR 67 | Temp 97.2°F | Resp 16 | Ht 65.0 in | Wt 128.0 lb

## 2017-10-18 DIAGNOSIS — C3491 Malignant neoplasm of unspecified part of right bronchus or lung: Secondary | ICD-10-CM | POA: Diagnosis not present

## 2017-10-18 NOTE — Patient Instructions (Signed)
I will call you with more information

## 2017-10-19 ENCOUNTER — Encounter: Payer: Self-pay | Admitting: Family Medicine

## 2017-10-19 ENCOUNTER — Telehealth: Payer: Self-pay | Admitting: Family Medicine

## 2017-10-19 NOTE — Telephone Encounter (Signed)
Anderson Malta is calling to advise Ky Moskowitz has an appt tomorrow 10-20-17 @ 9am, please call her to advise. Also she will be coming to [place] in parking [address] , Go to the 3rd floor,  And she is seeing Dr Cleaster Corin. If you have any questions please call [phone]

## 2017-10-19 NOTE — Progress Notes (Signed)
Patient is here today at her request with her husband [name]. Since her last formal appointment in December a lot has happened. At that point I had noticed hepatomegaly, she had weight loss, and I ordered a CAT scan of her abdomen.  The CAT scan of her abdomen showed lung tumor and liver tumors. I called Dr. Sinda Du in pulmonology for advice.  He recommended that she needed a CT of her chest with and without contrast for further delineation of the tumor.  Based on the abdominal CT appearance, it looks like a lung primary with hepatic metastases. The CT of her chest did show a cavitary lung tumor, adenopathy, and liver metastases.I consulted with oncology regarding the next best step.  It was recommended that she be referred to interventional radiology for a biopsy.  The biopsy of her liver revealed squamous cell carcinoma. She is now awaiting a formal consultation with oncology, and has been scheduled for October 25, 2017.  Lyndel called me asking to come in to discuss her condition her choices.  She wants to know what her staging is.  I explained to her that her primary care doctors do not usually stage tumors, the oncologists are specialist in this regard.  Based on her findings today, I suspect that she is a stage IV since she clearly has cavitary lung tumor, adenopathy, and hepatic metastases.  She asked about being referred to a major cancer center instead of coming to [facility] oncology.  I encouraged her to see the [facility] physician for consultation, and then pursue additional consultation depending on that interaction.  She wonders whether she will have surgery, radiation, or chemotherapy.  I did advise her that with the extent of her cancer she was likely to have chemotherapy or immunotherapy to treat her symptoms.  They ask whether to expect a cure.  I told him that I did not expect she would be easily cured, but on medication she could be maintained with a reasonable quality of life.  I gave her  copies of her pathology report and her CAT scan report at her request.  We had a speakerphone conversation with her daughter Marcelino Scot also had multiple questions.  At the end of our interaction I reassured Khaliah that I would call the cancer center at [facility] and see if her appointment could be moved up.  She also requested that I call [facility] to see when they would be able to see her.  Try to reassure her that waiting a week was not dangerous.  I encouraged her not smoke cigarettes, try to eat well, try to get rest and exercise as tolerated.  Greater than 50% of this visit was spent in counseling and coordinating care.  Total face to face time:   25 minutes

## 2017-10-19 NOTE — Telephone Encounter (Signed)
Called Neyda gave her the appointment information.  I also advised her to get his disc with her CT on it to take with her.  I spoke with her husband [name] and he will be happy to take her.  We will call [facility] and cancel her appointment there.

## 2017-10-20 DIAGNOSIS — C799 Secondary malignant neoplasm of unspecified site: Secondary | ICD-10-CM | POA: Insufficient documentation

## 2017-10-20 DIAGNOSIS — IMO0002 Reserved for concepts with insufficient information to code with codable children: Secondary | ICD-10-CM | POA: Insufficient documentation

## 2017-10-25 ENCOUNTER — Ambulatory Visit (HOSPITAL_COMMUNITY): Payer: BLUE CROSS/BLUE SHIELD | Admitting: Internal Medicine

## 2017-11-02 ENCOUNTER — Other Ambulatory Visit: Payer: Self-pay | Admitting: Family Medicine

## 2017-11-02 MED ORDER — HYDROCODONE-ACETAMINOPHEN 7.5-325 MG PO TABS: 1.0000 | ORAL_TABLET | Freq: Four times a day (QID) | ORAL | 0 refills | Status: DC | PRN

## 2017-11-10 DIAGNOSIS — C7951 Secondary malignant neoplasm of bone: Secondary | ICD-10-CM | POA: Insufficient documentation

## 2017-11-10 DIAGNOSIS — E876 Hypokalemia: Secondary | ICD-10-CM | POA: Insufficient documentation

## 2017-11-22 DIAGNOSIS — D649 Anemia, unspecified: Secondary | ICD-10-CM

## 2017-11-22 HISTORY — DX: Anemia, unspecified: D64.9

## 2017-12-05 ENCOUNTER — Encounter: Payer: Self-pay | Admitting: Family Medicine

## 2018-01-16 ENCOUNTER — Encounter: Payer: Self-pay | Admitting: Family Medicine

## 2018-01-16 ENCOUNTER — Other Ambulatory Visit: Payer: Self-pay | Admitting: Family Medicine

## 2018-01-16 ENCOUNTER — Other Ambulatory Visit: Payer: Self-pay

## 2018-01-16 ENCOUNTER — Telehealth: Payer: Self-pay | Admitting: Family Medicine

## 2018-01-16 DIAGNOSIS — C3490 Malignant neoplasm of unspecified part of unspecified bronchus or lung: Secondary | ICD-10-CM

## 2018-01-16 DIAGNOSIS — R079 Chest pain, unspecified: Secondary | ICD-10-CM

## 2018-01-16 DIAGNOSIS — R634 Abnormal weight loss: Secondary | ICD-10-CM

## 2018-01-16 DIAGNOSIS — C3491 Malignant neoplasm of unspecified part of right bronchus or lung: Secondary | ICD-10-CM

## 2018-01-16 HISTORY — DX: Malignant neoplasm of unspecified part of unspecified bronchus or lung: C34.90

## 2018-01-16 NOTE — Telephone Encounter (Signed)
Spoke with patient and she stated a nurse said she will need lab work and she would like to know if Dr. Meda Coffee would order it. She is aware Dr. Meda Coffee is leaving and she received a letter, however, with going to [facility], she has been unable to find another PCP. When asked what labs she is needing, she stated a UA and the "same labs as ordered before." Let patient know I will send Dr.Nelson a message and I will call her back with an answer. She verbalized understanding.

## 2018-01-16 NOTE — Telephone Encounter (Signed)
I never saw an order from [facility].  Hopefully standard labs will cover the request.  Please call [facility] tomorrow and get a copy of the labs needed.  We can add to her labs if not ordered.

## 2018-01-16 NOTE — Telephone Encounter (Signed)
Wants labs sent in to [facility], doesn't need to be seen,

## 2018-01-16 NOTE — Telephone Encounter (Signed)
I wrote ordered, but she needs to get them today so I can get the results by the time I leave.

## 2018-01-16 NOTE — Telephone Encounter (Signed)
Just FYI.

## 2018-01-16 NOTE — Telephone Encounter (Signed)
Pt had the Labs done, and wanted [place] to fax them to [facility] after Dr Meda Coffee reviews\releases them,, Joylene Igo [phone], ph [phone] ( they stated they was sending an order over)

## 2018-01-16 NOTE — Telephone Encounter (Signed)
Patient advised labs have been ordered and she needs to have them drawn today so that Dr.Nelson can have the results before leaving with verbal understanding.

## 2018-01-17 ENCOUNTER — Encounter: Payer: Self-pay | Admitting: Family Medicine

## 2018-01-17 LAB — URINALYSIS, ROUTINE W REFLEX MICROSCOPIC
BILIRUBIN URINE: NEGATIVE
Bacteria, UA: NONE SEEN /HPF
Hgb urine dipstick: NEGATIVE
Hyaline Cast: NONE SEEN /LPF
KETONES UR: NEGATIVE
NITRITE: NEGATIVE
Specific Gravity, Urine: 1.02 (ref 1.001–1.03)
pH: 8 (ref 5.0–8.0)

## 2018-01-17 LAB — LIPID PANEL
CHOL/HDL RATIO: 4.9 (calc) (ref <5.0)
Cholesterol: 93 mg/dL (ref <200)
HDL: 19 mg/dL — AB (ref >50)
LDL Cholesterol (Calc): 51 mg/dL (calc)
NON-HDL CHOLESTEROL (CALC): 74 mg/dL (ref <130)
TRIGLYCERIDES: 146 mg/dL (ref <150)

## 2018-01-17 LAB — CBC WITH DIFFERENTIAL/PLATELET
BASOS PCT: 0.3 %
Basophils Absolute: 65 cells/uL (ref 0–200)
EOS ABS: 65 {cells}/uL (ref 15–500)
Eosinophils Relative: 0.3 %
HEMATOCRIT: 26.1 % — AB (ref 35.0–45.0)
HEMOGLOBIN: 8.5 g/dL — AB (ref 11.7–15.5)
LYMPHS ABS: 1269 {cells}/uL (ref 850–3900)
MCH: 25.8 pg — ABNORMAL LOW (ref 27.0–33.0)
MCHC: 32.6 g/dL (ref 32.0–36.0)
MCV: 79.3 fL — AB (ref 80.0–100.0)
MPV: 10.2 fL (ref 7.5–12.5)
Monocytes Relative: 0.4 %
NEUTROS PCT: 93.1 %
Neutro Abs: 20017 cells/uL — ABNORMAL HIGH (ref 1500–7800)
Platelets: 473 10*3/uL — ABNORMAL HIGH (ref 140–400)
RBC: 3.29 10*6/uL — AB (ref 3.80–5.10)
RDW: 17.5 % — AB (ref 11.0–15.0)
TOTAL LYMPHOCYTE: 5.9 %
WBC: 21.5 10*3/uL — AB (ref 3.8–10.8)
WBCMIX: 86 {cells}/uL — AB (ref 200–950)

## 2018-01-17 LAB — COMPLETE METABOLIC PANEL WITH GFR
AG RATIO: 0.9 (calc) — AB (ref 1.0–2.5)
ALKALINE PHOSPHATASE (APISO): 240 U/L — AB (ref 33–130)
ALT: 18 U/L (ref 6–29)
AST: 21 U/L (ref 10–35)
Albumin: 2.8 g/dL — ABNORMAL LOW (ref 3.6–5.1)
BILIRUBIN TOTAL: 0.8 mg/dL (ref 0.2–1.2)
BUN/Creatinine Ratio: 36 (calc) — ABNORMAL HIGH (ref 6–22)
BUN: 12 mg/dL (ref 7–25)
CHLORIDE: 88 mmol/L — AB (ref 98–110)
CO2: 34 mmol/L — ABNORMAL HIGH (ref 20–32)
Calcium: 8.5 mg/dL — ABNORMAL LOW (ref 8.6–10.4)
Creat: 0.33 mg/dL — ABNORMAL LOW (ref 0.50–0.99)
GFR, Est African American: 137 mL/min/{1.73_m2} (ref >=60)
GFR, Est Non African American: 118 mL/min/{1.73_m2} (ref >=60)
GLUCOSE: 121 mg/dL (ref 65–139)
Globulin: 3.1 g/dL (calc) (ref 1.9–3.7)
POTASSIUM: 4.2 mmol/L (ref 3.5–5.3)
Sodium: 128 mmol/L — ABNORMAL LOW (ref 135–146)
Total Protein: 5.9 g/dL — ABNORMAL LOW (ref 6.1–8.1)

## 2018-01-17 LAB — TSH: TSH: 1.09 mIU/L (ref 0.40–4.50)

## 2018-01-17 NOTE — Telephone Encounter (Signed)
Printed and faxed labs to [facility] @ [phone]

## 2018-01-17 NOTE — Telephone Encounter (Signed)
Left generic message requesting call back.  

## 2018-06-08 DIAGNOSIS — H2513 Age-related nuclear cataract, bilateral: Secondary | ICD-10-CM | POA: Insufficient documentation

## 2018-06-08 DIAGNOSIS — H43813 Vitreous degeneration, bilateral: Secondary | ICD-10-CM | POA: Insufficient documentation

## 2018-06-08 DIAGNOSIS — H04123 Dry eye syndrome of bilateral lacrimal glands: Secondary | ICD-10-CM | POA: Insufficient documentation

## 2018-06-08 HISTORY — DX: Age-related nuclear cataract, bilateral: H25.13

## 2018-06-19 ENCOUNTER — Other Ambulatory Visit: Payer: BLUE CROSS/BLUE SHIELD | Admitting: Adult Health

## 2018-07-06 ENCOUNTER — Other Ambulatory Visit: Payer: BLUE CROSS/BLUE SHIELD | Admitting: Adult Health

## 2018-07-19 ENCOUNTER — Ambulatory Visit: Payer: BLUE CROSS/BLUE SHIELD | Admitting: Adult Health

## 2018-07-19 ENCOUNTER — Other Ambulatory Visit (HOSPITAL_COMMUNITY)
Admission: RE | Admit: 2018-07-19 | Discharge: 2018-07-19 | Disposition: A | Discharge status: Home / Self Care | Payer: BLUE CROSS/BLUE SHIELD | Source: Ambulatory Visit | Attending: Adult Health | Admitting: Adult Health

## 2018-07-19 ENCOUNTER — Encounter: Payer: Self-pay | Admitting: Adult Health

## 2018-07-19 VITALS — BP 125/73 | HR 91 | Ht 66.0 in | Wt 118.0 lb

## 2018-07-19 DIAGNOSIS — Z1212 Encounter for screening for malignant neoplasm of rectum: Secondary | ICD-10-CM | POA: Diagnosis not present

## 2018-07-19 DIAGNOSIS — Z01419 Encounter for gynecological examination (general) (routine) without abnormal findings: Secondary | ICD-10-CM | POA: Diagnosis present

## 2018-07-19 DIAGNOSIS — Z1211 Encounter for screening for malignant neoplasm of colon: Secondary | ICD-10-CM | POA: Diagnosis not present

## 2018-07-19 DIAGNOSIS — N941 Unspecified dyspareunia: Secondary | ICD-10-CM | POA: Diagnosis not present

## 2018-07-19 DIAGNOSIS — N952 Postmenopausal atrophic vaginitis: Secondary | ICD-10-CM | POA: Diagnosis not present

## 2018-07-19 LAB — HEMOCCULT GUIAC POC 1CARD (OFFICE): FECAL OCCULT BLD: NEGATIVE

## 2018-07-19 MED ORDER — PRASTERONE 6.5 MG VA INST: 1.0000 | VAGINAL_INSERT | Freq: Every day | VAGINAL | 0 refills | Status: DC

## 2018-07-19 NOTE — Progress Notes (Signed)
Patient ID: Cynthia Kelley, female   DOB: September 22, 1956, 62 y.o.   MRN: [id number] History of Present Illness:  Cynthia Kelley is a 62 year old white female, married, PM, in for a well woman gyn exam and pap. She was diagnosed with lung cancer with mets to liver and has had 6 rounds of chemo, then [facility] and is now on new experimental drug, is in study at [facility]. She lost down to 99 but has gained weight with megace, and had had terrible constipation(felt like parts falling out) but is better now.Has pain with sex and really wants to enjoy sex again, she is going to [place] in November.  No current PCP.  Current Medications, Allergies, Past Medical History, Past Surgical History, Family History and Social History were reviewed in [employer] record.     Review of Systems: Patient denies any headaches, hearing loss, fatigue, blurred vision, shortness of breath, chest pain, abdominal pain, problems with bowel movements, urination, or intercourse(sex hurts) . No joint pain or mood swings.    Physical Exam:BP 125/73 (BP Location: Right Arm, Patient Position: Sitting, Cuff Size: Normal)   Pulse 91   Ht 5\' 6"  (1.676 m)   Wt 118 lb (53.5 kg)   BMI 19.05 kg/m  General:  Well developed,thin, no acute distress Skin:  Warm and dry,tan Neck:  Midline trachea, normal thyroid, good ROM, no lymphadenopathy,no carotid bruits heard Lungs; Clear to auscultation bilaterally Breast:  No dominant palpable mass, retraction, or nipple discharge,has port a cath above right breast Cardiovascular: Regular rate and rhythm Abdomen:  Soft, non tender, no hepatosplenomegaly Pelvic:  External genitalia is normal in appearance, no lesions.  The vagina is pale and has lost rugae and moisture. Urethra has no lesions or masses. The cervix is smooth, pap with HPV performed.  Uterus is felt to be normal size, shape, and contour.  No adnexal masses or tenderness noted.Bladder is non tender, no masses felt. Rectal: Good  sphincter tone, no polyps, or hemorrhoids felt.  Hemoccult negative.+mild low rectocele.  Extremities/musculoskeletal:  No swelling or varicosities noted, no clubbing or cyanosis Psych:  No mood changes, alert and cooperative,seems happy PHQ 9 score is 5, she denies being suicidal and declines meds.  Examination chaperoned by Estill Bamberg Rash LPN.  Will try intrarosa and discussed using 1/2 cup oatmeal, 1/2 cupo real applesauce and 1/2 cup of prune juice and given name movamik.   Impression: 1. Encounter for gynecological examination with Papanicolaou smear of cervix   2. Screening for colorectal cancer   3. Dyspareunia, female   4. Vaginal atrophy       Plan: Use good lubricate like astroglide, or olive oil, place on self and partner, if like intrarosa will get RX  Meds ordered this encounter  Medications  . Prasterone (INTRAROSA) 6.5 MG INST    Sig: Place 1 applicator vaginally daily.    Dispense:  28 each    Refill:  0    Order Specific Question:   Supervising Provider    Answer:   Florian Buff [[id number]]  Get mammogram  Physical in 1 year Pap in 3 if normal

## 2018-07-24 LAB — CYTOLOGY - PAP
DIAGNOSIS: NEGATIVE
HPV: NOT DETECTED

## 2018-08-21 ENCOUNTER — Telehealth: Payer: Self-pay | Admitting: *Deleted

## 2018-08-21 NOTE — Telephone Encounter (Signed)
Pt called requesting a sample of intrarosa. Advised that I would leave the sample for her at front desk. Pt states that the medication is helping. Advised pt to call and let Anderson Malta know when she wanted the prescription to be sent to her pharmacy. Pt verbalized understanding.  Lot [id number] Exp 07/2020

## 2018-09-06 ENCOUNTER — Telehealth: Payer: Self-pay | Admitting: Adult Health

## 2018-09-06 NOTE — Telephone Encounter (Signed)
Patient called stating that she was given some samples of medication by Anderson Malta and was told if it worked to give a call so Anderson Malta could her in something to the pharmacy. Pt states that it works great and would like it called into the [facility] in [place]. Please contact pt

## 2018-09-07 MED ORDER — PRASTERONE 6.5 MG VA INST: 1.0000 | VAGINAL_INSERT | Freq: Every day | VAGINAL | 6 refills | Status: DC

## 2018-09-07 NOTE — Telephone Encounter (Signed)
Left message that rx for intrarosa sent to [place] and if needs discount card come by office to get one

## 2019-06-28 DIAGNOSIS — C7951 Secondary malignant neoplasm of bone: Secondary | ICD-10-CM | POA: Diagnosis not present

## 2019-06-28 DIAGNOSIS — Z23 Encounter for immunization: Secondary | ICD-10-CM | POA: Diagnosis not present

## 2019-06-28 DIAGNOSIS — E871 Hypo-osmolality and hyponatremia: Secondary | ICD-10-CM | POA: Diagnosis not present

## 2019-06-28 DIAGNOSIS — C787 Secondary malignant neoplasm of liver and intrahepatic bile duct: Secondary | ICD-10-CM | POA: Diagnosis not present

## 2019-06-28 DIAGNOSIS — C799 Secondary malignant neoplasm of unspecified site: Secondary | ICD-10-CM | POA: Diagnosis not present

## 2019-06-28 DIAGNOSIS — G893 Neoplasm related pain (acute) (chronic): Secondary | ICD-10-CM | POA: Diagnosis not present

## 2019-06-28 DIAGNOSIS — G4700 Insomnia, unspecified: Secondary | ICD-10-CM | POA: Diagnosis not present

## 2019-06-28 DIAGNOSIS — C3491 Malignant neoplasm of unspecified part of right bronchus or lung: Secondary | ICD-10-CM | POA: Diagnosis not present

## 2019-06-28 DIAGNOSIS — Z95828 Presence of other vascular implants and grafts: Secondary | ICD-10-CM | POA: Diagnosis not present

## 2019-07-05 DIAGNOSIS — Z5111 Encounter for antineoplastic chemotherapy: Secondary | ICD-10-CM | POA: Diagnosis not present

## 2019-07-05 DIAGNOSIS — C7951 Secondary malignant neoplasm of bone: Secondary | ICD-10-CM | POA: Diagnosis not present

## 2019-07-05 DIAGNOSIS — C787 Secondary malignant neoplasm of liver and intrahepatic bile duct: Secondary | ICD-10-CM | POA: Diagnosis not present

## 2019-07-05 DIAGNOSIS — Z79899 Other long term (current) drug therapy: Secondary | ICD-10-CM | POA: Diagnosis not present

## 2019-07-05 DIAGNOSIS — C3491 Malignant neoplasm of unspecified part of right bronchus or lung: Secondary | ICD-10-CM | POA: Diagnosis not present

## 2019-07-17 DIAGNOSIS — C3431 Malignant neoplasm of lower lobe, right bronchus or lung: Secondary | ICD-10-CM | POA: Diagnosis not present

## 2019-07-17 DIAGNOSIS — C3491 Malignant neoplasm of unspecified part of right bronchus or lung: Secondary | ICD-10-CM | POA: Diagnosis not present

## 2019-07-17 DIAGNOSIS — C7951 Secondary malignant neoplasm of bone: Secondary | ICD-10-CM | POA: Diagnosis not present

## 2019-07-19 DIAGNOSIS — E871 Hypo-osmolality and hyponatremia: Secondary | ICD-10-CM | POA: Diagnosis not present

## 2019-07-19 DIAGNOSIS — C787 Secondary malignant neoplasm of liver and intrahepatic bile duct: Secondary | ICD-10-CM | POA: Diagnosis not present

## 2019-07-19 DIAGNOSIS — G893 Neoplasm related pain (acute) (chronic): Secondary | ICD-10-CM | POA: Diagnosis not present

## 2019-07-19 DIAGNOSIS — C3491 Malignant neoplasm of unspecified part of right bronchus or lung: Secondary | ICD-10-CM | POA: Diagnosis not present

## 2019-07-19 DIAGNOSIS — Z79899 Other long term (current) drug therapy: Secondary | ICD-10-CM | POA: Diagnosis not present

## 2019-07-19 DIAGNOSIS — Z95828 Presence of other vascular implants and grafts: Secondary | ICD-10-CM | POA: Diagnosis not present

## 2019-07-19 DIAGNOSIS — G4700 Insomnia, unspecified: Secondary | ICD-10-CM | POA: Diagnosis not present

## 2019-07-19 DIAGNOSIS — C7951 Secondary malignant neoplasm of bone: Secondary | ICD-10-CM | POA: Diagnosis not present

## 2019-07-19 DIAGNOSIS — C799 Secondary malignant neoplasm of unspecified site: Secondary | ICD-10-CM | POA: Diagnosis not present

## 2019-07-26 DIAGNOSIS — C787 Secondary malignant neoplasm of liver and intrahepatic bile duct: Secondary | ICD-10-CM | POA: Diagnosis not present

## 2019-07-26 DIAGNOSIS — C7951 Secondary malignant neoplasm of bone: Secondary | ICD-10-CM | POA: Diagnosis not present

## 2019-07-26 DIAGNOSIS — Z5111 Encounter for antineoplastic chemotherapy: Secondary | ICD-10-CM | POA: Diagnosis not present

## 2019-07-26 DIAGNOSIS — C3491 Malignant neoplasm of unspecified part of right bronchus or lung: Secondary | ICD-10-CM | POA: Diagnosis not present

## 2019-08-09 DIAGNOSIS — Z95828 Presence of other vascular implants and grafts: Secondary | ICD-10-CM | POA: Diagnosis not present

## 2019-08-09 DIAGNOSIS — C799 Secondary malignant neoplasm of unspecified site: Secondary | ICD-10-CM | POA: Diagnosis not present

## 2019-08-09 DIAGNOSIS — C787 Secondary malignant neoplasm of liver and intrahepatic bile duct: Secondary | ICD-10-CM | POA: Diagnosis not present

## 2019-08-09 DIAGNOSIS — C3491 Malignant neoplasm of unspecified part of right bronchus or lung: Secondary | ICD-10-CM | POA: Diagnosis not present

## 2019-08-09 DIAGNOSIS — G4700 Insomnia, unspecified: Secondary | ICD-10-CM | POA: Diagnosis not present

## 2019-08-09 DIAGNOSIS — C7951 Secondary malignant neoplasm of bone: Secondary | ICD-10-CM | POA: Diagnosis not present

## 2019-08-09 DIAGNOSIS — G893 Neoplasm related pain (acute) (chronic): Secondary | ICD-10-CM | POA: Diagnosis not present

## 2019-08-09 DIAGNOSIS — Z79899 Other long term (current) drug therapy: Secondary | ICD-10-CM | POA: Diagnosis not present

## 2019-08-09 DIAGNOSIS — E871 Hypo-osmolality and hyponatremia: Secondary | ICD-10-CM | POA: Diagnosis not present

## 2019-08-16 DIAGNOSIS — C3491 Malignant neoplasm of unspecified part of right bronchus or lung: Secondary | ICD-10-CM | POA: Diagnosis not present

## 2019-08-16 DIAGNOSIS — Z5111 Encounter for antineoplastic chemotherapy: Secondary | ICD-10-CM | POA: Diagnosis not present

## 2019-08-16 DIAGNOSIS — C7951 Secondary malignant neoplasm of bone: Secondary | ICD-10-CM | POA: Diagnosis not present

## 2019-08-16 DIAGNOSIS — C787 Secondary malignant neoplasm of liver and intrahepatic bile duct: Secondary | ICD-10-CM | POA: Diagnosis not present

## 2019-08-20 DIAGNOSIS — R1310 Dysphagia, unspecified: Secondary | ICD-10-CM | POA: Diagnosis not present

## 2019-08-20 DIAGNOSIS — R633 Feeding difficulties: Secondary | ICD-10-CM | POA: Diagnosis not present

## 2019-08-28 DIAGNOSIS — C7951 Secondary malignant neoplasm of bone: Secondary | ICD-10-CM | POA: Diagnosis not present

## 2019-08-28 DIAGNOSIS — C799 Secondary malignant neoplasm of unspecified site: Secondary | ICD-10-CM | POA: Diagnosis not present

## 2019-08-28 DIAGNOSIS — C3491 Malignant neoplasm of unspecified part of right bronchus or lung: Secondary | ICD-10-CM | POA: Diagnosis not present

## 2019-08-30 DIAGNOSIS — C787 Secondary malignant neoplasm of liver and intrahepatic bile duct: Secondary | ICD-10-CM | POA: Diagnosis not present

## 2019-08-30 DIAGNOSIS — C3431 Malignant neoplasm of lower lobe, right bronchus or lung: Secondary | ICD-10-CM | POA: Diagnosis not present

## 2019-08-30 DIAGNOSIS — E871 Hypo-osmolality and hyponatremia: Secondary | ICD-10-CM | POA: Diagnosis not present

## 2019-08-30 DIAGNOSIS — Z95828 Presence of other vascular implants and grafts: Secondary | ICD-10-CM | POA: Diagnosis not present

## 2019-08-30 DIAGNOSIS — G893 Neoplasm related pain (acute) (chronic): Secondary | ICD-10-CM | POA: Diagnosis not present

## 2019-08-30 DIAGNOSIS — C3491 Malignant neoplasm of unspecified part of right bronchus or lung: Secondary | ICD-10-CM | POA: Diagnosis not present

## 2019-08-30 DIAGNOSIS — Z79899 Other long term (current) drug therapy: Secondary | ICD-10-CM | POA: Diagnosis not present

## 2019-08-30 DIAGNOSIS — C7951 Secondary malignant neoplasm of bone: Secondary | ICD-10-CM | POA: Diagnosis not present

## 2019-08-30 DIAGNOSIS — G4700 Insomnia, unspecified: Secondary | ICD-10-CM | POA: Diagnosis not present

## 2019-09-06 DIAGNOSIS — Z5111 Encounter for antineoplastic chemotherapy: Secondary | ICD-10-CM | POA: Diagnosis not present

## 2019-09-06 DIAGNOSIS — Z79899 Other long term (current) drug therapy: Secondary | ICD-10-CM | POA: Diagnosis not present

## 2019-09-06 DIAGNOSIS — C3491 Malignant neoplasm of unspecified part of right bronchus or lung: Secondary | ICD-10-CM | POA: Diagnosis not present

## 2019-09-06 DIAGNOSIS — C7951 Secondary malignant neoplasm of bone: Secondary | ICD-10-CM | POA: Diagnosis not present

## 2019-09-06 DIAGNOSIS — C787 Secondary malignant neoplasm of liver and intrahepatic bile duct: Secondary | ICD-10-CM | POA: Diagnosis not present

## 2019-09-13 DIAGNOSIS — I10 Essential (primary) hypertension: Secondary | ICD-10-CM | POA: Diagnosis not present

## 2019-09-13 DIAGNOSIS — C3490 Malignant neoplasm of unspecified part of unspecified bronchus or lung: Secondary | ICD-10-CM | POA: Diagnosis not present

## 2019-10-04 DIAGNOSIS — E871 Hypo-osmolality and hyponatremia: Secondary | ICD-10-CM | POA: Diagnosis not present

## 2019-10-04 DIAGNOSIS — G4700 Insomnia, unspecified: Secondary | ICD-10-CM | POA: Diagnosis not present

## 2019-10-04 DIAGNOSIS — Z79891 Long term (current) use of opiate analgesic: Secondary | ICD-10-CM | POA: Diagnosis not present

## 2019-10-04 DIAGNOSIS — C787 Secondary malignant neoplasm of liver and intrahepatic bile duct: Secondary | ICD-10-CM | POA: Diagnosis not present

## 2019-10-04 DIAGNOSIS — G893 Neoplasm related pain (acute) (chronic): Secondary | ICD-10-CM | POA: Diagnosis not present

## 2019-10-04 DIAGNOSIS — Z95828 Presence of other vascular implants and grafts: Secondary | ICD-10-CM | POA: Diagnosis not present

## 2019-10-04 DIAGNOSIS — C7951 Secondary malignant neoplasm of bone: Secondary | ICD-10-CM | POA: Diagnosis not present

## 2019-10-04 DIAGNOSIS — Z79899 Other long term (current) drug therapy: Secondary | ICD-10-CM | POA: Diagnosis not present

## 2019-10-04 DIAGNOSIS — C3491 Malignant neoplasm of unspecified part of right bronchus or lung: Secondary | ICD-10-CM | POA: Diagnosis not present

## 2019-10-11 DIAGNOSIS — C3431 Malignant neoplasm of lower lobe, right bronchus or lung: Secondary | ICD-10-CM | POA: Diagnosis not present

## 2019-10-11 DIAGNOSIS — C7951 Secondary malignant neoplasm of bone: Secondary | ICD-10-CM | POA: Diagnosis not present

## 2019-10-11 DIAGNOSIS — Z5111 Encounter for antineoplastic chemotherapy: Secondary | ICD-10-CM | POA: Diagnosis not present

## 2019-10-23 DIAGNOSIS — C3491 Malignant neoplasm of unspecified part of right bronchus or lung: Secondary | ICD-10-CM | POA: Diagnosis not present

## 2019-10-23 DIAGNOSIS — C799 Secondary malignant neoplasm of unspecified site: Secondary | ICD-10-CM | POA: Diagnosis not present

## 2019-10-23 DIAGNOSIS — C7951 Secondary malignant neoplasm of bone: Secondary | ICD-10-CM | POA: Diagnosis not present

## 2019-10-25 DIAGNOSIS — Z79899 Other long term (current) drug therapy: Secondary | ICD-10-CM | POA: Diagnosis not present

## 2019-10-25 DIAGNOSIS — C787 Secondary malignant neoplasm of liver and intrahepatic bile duct: Secondary | ICD-10-CM | POA: Diagnosis not present

## 2019-10-25 DIAGNOSIS — C3431 Malignant neoplasm of lower lobe, right bronchus or lung: Secondary | ICD-10-CM | POA: Diagnosis not present

## 2019-10-25 DIAGNOSIS — E871 Hypo-osmolality and hyponatremia: Secondary | ICD-10-CM | POA: Diagnosis not present

## 2019-10-25 DIAGNOSIS — D649 Anemia, unspecified: Secondary | ICD-10-CM | POA: Diagnosis not present

## 2019-10-25 DIAGNOSIS — Z5111 Encounter for antineoplastic chemotherapy: Secondary | ICD-10-CM | POA: Diagnosis not present

## 2019-10-25 DIAGNOSIS — C799 Secondary malignant neoplasm of unspecified site: Secondary | ICD-10-CM | POA: Diagnosis not present

## 2019-10-25 DIAGNOSIS — C7951 Secondary malignant neoplasm of bone: Secondary | ICD-10-CM | POA: Diagnosis not present

## 2019-10-25 DIAGNOSIS — G4700 Insomnia, unspecified: Secondary | ICD-10-CM | POA: Diagnosis not present

## 2019-10-25 DIAGNOSIS — Z95828 Presence of other vascular implants and grafts: Secondary | ICD-10-CM | POA: Diagnosis not present

## 2019-10-25 DIAGNOSIS — G893 Neoplasm related pain (acute) (chronic): Secondary | ICD-10-CM | POA: Diagnosis not present

## 2019-10-25 DIAGNOSIS — C3491 Malignant neoplasm of unspecified part of right bronchus or lung: Secondary | ICD-10-CM | POA: Diagnosis not present

## 2019-10-25 DIAGNOSIS — E876 Hypokalemia: Secondary | ICD-10-CM | POA: Diagnosis not present

## 2019-11-01 DIAGNOSIS — C7951 Secondary malignant neoplasm of bone: Secondary | ICD-10-CM | POA: Diagnosis not present

## 2019-11-01 DIAGNOSIS — Z79899 Other long term (current) drug therapy: Secondary | ICD-10-CM | POA: Diagnosis not present

## 2019-11-01 DIAGNOSIS — Z5111 Encounter for antineoplastic chemotherapy: Secondary | ICD-10-CM | POA: Diagnosis not present

## 2019-11-01 DIAGNOSIS — C3431 Malignant neoplasm of lower lobe, right bronchus or lung: Secondary | ICD-10-CM | POA: Diagnosis not present

## 2019-11-06 DIAGNOSIS — H2511 Age-related nuclear cataract, right eye: Secondary | ICD-10-CM | POA: Diagnosis not present

## 2019-11-06 DIAGNOSIS — H25013 Cortical age-related cataract, bilateral: Secondary | ICD-10-CM | POA: Diagnosis not present

## 2019-11-06 DIAGNOSIS — H18413 Arcus senilis, bilateral: Secondary | ICD-10-CM | POA: Diagnosis not present

## 2019-11-06 DIAGNOSIS — H2513 Age-related nuclear cataract, bilateral: Secondary | ICD-10-CM | POA: Diagnosis not present

## 2019-11-06 DIAGNOSIS — H25043 Posterior subcapsular polar age-related cataract, bilateral: Secondary | ICD-10-CM | POA: Diagnosis not present

## 2019-11-30 DIAGNOSIS — H2511 Age-related nuclear cataract, right eye: Secondary | ICD-10-CM | POA: Diagnosis not present

## 2019-11-30 DIAGNOSIS — H2512 Age-related nuclear cataract, left eye: Secondary | ICD-10-CM | POA: Diagnosis not present

## 2019-12-06 DIAGNOSIS — C799 Secondary malignant neoplasm of unspecified site: Secondary | ICD-10-CM | POA: Diagnosis not present

## 2019-12-06 DIAGNOSIS — C7951 Secondary malignant neoplasm of bone: Secondary | ICD-10-CM | POA: Diagnosis not present

## 2019-12-06 DIAGNOSIS — Z5111 Encounter for antineoplastic chemotherapy: Secondary | ICD-10-CM | POA: Diagnosis not present

## 2019-12-06 DIAGNOSIS — G4700 Insomnia, unspecified: Secondary | ICD-10-CM | POA: Diagnosis not present

## 2019-12-06 DIAGNOSIS — Z79899 Other long term (current) drug therapy: Secondary | ICD-10-CM | POA: Diagnosis not present

## 2019-12-06 DIAGNOSIS — G893 Neoplasm related pain (acute) (chronic): Secondary | ICD-10-CM | POA: Diagnosis not present

## 2019-12-06 DIAGNOSIS — C3431 Malignant neoplasm of lower lobe, right bronchus or lung: Secondary | ICD-10-CM | POA: Diagnosis not present

## 2019-12-06 DIAGNOSIS — Z95828 Presence of other vascular implants and grafts: Secondary | ICD-10-CM | POA: Diagnosis not present

## 2019-12-06 DIAGNOSIS — D649 Anemia, unspecified: Secondary | ICD-10-CM | POA: Diagnosis not present

## 2019-12-06 DIAGNOSIS — C3491 Malignant neoplasm of unspecified part of right bronchus or lung: Secondary | ICD-10-CM | POA: Diagnosis not present

## 2019-12-06 DIAGNOSIS — Z923 Personal history of irradiation: Secondary | ICD-10-CM | POA: Diagnosis not present

## 2019-12-06 DIAGNOSIS — E871 Hypo-osmolality and hyponatremia: Secondary | ICD-10-CM | POA: Diagnosis not present

## 2019-12-13 DIAGNOSIS — C7951 Secondary malignant neoplasm of bone: Secondary | ICD-10-CM | POA: Diagnosis not present

## 2019-12-13 DIAGNOSIS — Z79899 Other long term (current) drug therapy: Secondary | ICD-10-CM | POA: Diagnosis not present

## 2019-12-13 DIAGNOSIS — C799 Secondary malignant neoplasm of unspecified site: Secondary | ICD-10-CM | POA: Diagnosis not present

## 2019-12-13 DIAGNOSIS — E871 Hypo-osmolality and hyponatremia: Secondary | ICD-10-CM | POA: Diagnosis not present

## 2019-12-13 DIAGNOSIS — G4700 Insomnia, unspecified: Secondary | ICD-10-CM | POA: Diagnosis not present

## 2019-12-13 DIAGNOSIS — G893 Neoplasm related pain (acute) (chronic): Secondary | ICD-10-CM | POA: Diagnosis not present

## 2019-12-13 DIAGNOSIS — D649 Anemia, unspecified: Secondary | ICD-10-CM | POA: Diagnosis not present

## 2019-12-13 DIAGNOSIS — C3491 Malignant neoplasm of unspecified part of right bronchus or lung: Secondary | ICD-10-CM | POA: Diagnosis not present

## 2019-12-28 DIAGNOSIS — H2512 Age-related nuclear cataract, left eye: Secondary | ICD-10-CM | POA: Diagnosis not present

## 2020-01-01 DIAGNOSIS — R918 Other nonspecific abnormal finding of lung field: Secondary | ICD-10-CM | POA: Diagnosis not present

## 2020-01-01 DIAGNOSIS — K769 Liver disease, unspecified: Secondary | ICD-10-CM | POA: Diagnosis not present

## 2020-01-01 DIAGNOSIS — C3491 Malignant neoplasm of unspecified part of right bronchus or lung: Secondary | ICD-10-CM | POA: Diagnosis not present

## 2020-01-03 DIAGNOSIS — Z9849 Cataract extraction status, unspecified eye: Secondary | ICD-10-CM | POA: Diagnosis not present

## 2020-01-03 DIAGNOSIS — C3491 Malignant neoplasm of unspecified part of right bronchus or lung: Secondary | ICD-10-CM | POA: Diagnosis not present

## 2020-01-03 DIAGNOSIS — Z95828 Presence of other vascular implants and grafts: Secondary | ICD-10-CM | POA: Diagnosis not present

## 2020-01-03 DIAGNOSIS — G893 Neoplasm related pain (acute) (chronic): Secondary | ICD-10-CM | POA: Diagnosis not present

## 2020-01-03 DIAGNOSIS — Z79899 Other long term (current) drug therapy: Secondary | ICD-10-CM | POA: Diagnosis not present

## 2020-01-03 DIAGNOSIS — G4700 Insomnia, unspecified: Secondary | ICD-10-CM | POA: Diagnosis not present

## 2020-01-03 DIAGNOSIS — C3411 Malignant neoplasm of upper lobe, right bronchus or lung: Secondary | ICD-10-CM | POA: Diagnosis not present

## 2020-01-03 DIAGNOSIS — E871 Hypo-osmolality and hyponatremia: Secondary | ICD-10-CM | POA: Diagnosis not present

## 2020-01-03 DIAGNOSIS — C799 Secondary malignant neoplasm of unspecified site: Secondary | ICD-10-CM | POA: Diagnosis not present

## 2020-01-03 DIAGNOSIS — C7951 Secondary malignant neoplasm of bone: Secondary | ICD-10-CM | POA: Diagnosis not present

## 2020-01-03 DIAGNOSIS — C787 Secondary malignant neoplasm of liver and intrahepatic bile duct: Secondary | ICD-10-CM | POA: Diagnosis not present

## 2020-01-03 DIAGNOSIS — Z5111 Encounter for antineoplastic chemotherapy: Secondary | ICD-10-CM | POA: Diagnosis not present

## 2020-01-03 DIAGNOSIS — Z923 Personal history of irradiation: Secondary | ICD-10-CM | POA: Diagnosis not present

## 2020-01-10 DIAGNOSIS — G893 Neoplasm related pain (acute) (chronic): Secondary | ICD-10-CM | POA: Diagnosis not present

## 2020-01-10 DIAGNOSIS — Z5111 Encounter for antineoplastic chemotherapy: Secondary | ICD-10-CM | POA: Diagnosis not present

## 2020-01-10 DIAGNOSIS — R599 Enlarged lymph nodes, unspecified: Secondary | ICD-10-CM | POA: Diagnosis not present

## 2020-01-10 DIAGNOSIS — C3491 Malignant neoplasm of unspecified part of right bronchus or lung: Secondary | ICD-10-CM | POA: Diagnosis not present

## 2020-01-10 DIAGNOSIS — Z79899 Other long term (current) drug therapy: Secondary | ICD-10-CM | POA: Diagnosis not present

## 2020-01-10 DIAGNOSIS — C7951 Secondary malignant neoplasm of bone: Secondary | ICD-10-CM | POA: Diagnosis not present

## 2020-01-10 DIAGNOSIS — C787 Secondary malignant neoplasm of liver and intrahepatic bile duct: Secondary | ICD-10-CM | POA: Diagnosis not present

## 2020-01-10 DIAGNOSIS — E871 Hypo-osmolality and hyponatremia: Secondary | ICD-10-CM | POA: Diagnosis not present

## 2020-01-10 DIAGNOSIS — G4700 Insomnia, unspecified: Secondary | ICD-10-CM | POA: Diagnosis not present

## 2020-01-24 DIAGNOSIS — Z79899 Other long term (current) drug therapy: Secondary | ICD-10-CM | POA: Diagnosis not present

## 2020-01-24 DIAGNOSIS — E876 Hypokalemia: Secondary | ICD-10-CM | POA: Diagnosis not present

## 2020-01-24 DIAGNOSIS — C3491 Malignant neoplasm of unspecified part of right bronchus or lung: Secondary | ICD-10-CM | POA: Diagnosis not present

## 2020-01-24 DIAGNOSIS — C799 Secondary malignant neoplasm of unspecified site: Secondary | ICD-10-CM | POA: Diagnosis not present

## 2020-01-24 DIAGNOSIS — G4700 Insomnia, unspecified: Secondary | ICD-10-CM | POA: Diagnosis not present

## 2020-01-24 DIAGNOSIS — Z5111 Encounter for antineoplastic chemotherapy: Secondary | ICD-10-CM | POA: Diagnosis not present

## 2020-01-24 DIAGNOSIS — C7951 Secondary malignant neoplasm of bone: Secondary | ICD-10-CM | POA: Diagnosis not present

## 2020-01-24 DIAGNOSIS — G893 Neoplasm related pain (acute) (chronic): Secondary | ICD-10-CM | POA: Diagnosis not present

## 2020-01-24 DIAGNOSIS — C787 Secondary malignant neoplasm of liver and intrahepatic bile duct: Secondary | ICD-10-CM | POA: Diagnosis not present

## 2020-01-24 DIAGNOSIS — E871 Hypo-osmolality and hyponatremia: Secondary | ICD-10-CM | POA: Diagnosis not present

## 2020-01-31 DIAGNOSIS — C787 Secondary malignant neoplasm of liver and intrahepatic bile duct: Secondary | ICD-10-CM | POA: Diagnosis not present

## 2020-01-31 DIAGNOSIS — C7951 Secondary malignant neoplasm of bone: Secondary | ICD-10-CM | POA: Diagnosis not present

## 2020-01-31 DIAGNOSIS — C3491 Malignant neoplasm of unspecified part of right bronchus or lung: Secondary | ICD-10-CM | POA: Diagnosis not present

## 2020-01-31 DIAGNOSIS — Z79899 Other long term (current) drug therapy: Secondary | ICD-10-CM | POA: Diagnosis not present

## 2020-01-31 DIAGNOSIS — Z5111 Encounter for antineoplastic chemotherapy: Secondary | ICD-10-CM | POA: Diagnosis not present

## 2020-02-14 DIAGNOSIS — Z801 Family history of malignant neoplasm of trachea, bronchus and lung: Secondary | ICD-10-CM | POA: Diagnosis not present

## 2020-02-14 DIAGNOSIS — C3431 Malignant neoplasm of lower lobe, right bronchus or lung: Secondary | ICD-10-CM | POA: Diagnosis not present

## 2020-02-14 DIAGNOSIS — Z79899 Other long term (current) drug therapy: Secondary | ICD-10-CM | POA: Diagnosis not present

## 2020-02-14 DIAGNOSIS — C3491 Malignant neoplasm of unspecified part of right bronchus or lung: Secondary | ICD-10-CM | POA: Diagnosis not present

## 2020-02-14 DIAGNOSIS — C7951 Secondary malignant neoplasm of bone: Secondary | ICD-10-CM | POA: Diagnosis not present

## 2020-02-14 DIAGNOSIS — G4700 Insomnia, unspecified: Secondary | ICD-10-CM | POA: Diagnosis not present

## 2020-02-14 DIAGNOSIS — E871 Hypo-osmolality and hyponatremia: Secondary | ICD-10-CM | POA: Diagnosis not present

## 2020-02-14 DIAGNOSIS — G893 Neoplasm related pain (acute) (chronic): Secondary | ICD-10-CM | POA: Diagnosis not present

## 2020-02-14 DIAGNOSIS — Z923 Personal history of irradiation: Secondary | ICD-10-CM | POA: Diagnosis not present

## 2020-02-14 DIAGNOSIS — C799 Secondary malignant neoplasm of unspecified site: Secondary | ICD-10-CM | POA: Diagnosis not present

## 2020-02-14 DIAGNOSIS — Z95828 Presence of other vascular implants and grafts: Secondary | ICD-10-CM | POA: Diagnosis not present

## 2020-02-14 DIAGNOSIS — C787 Secondary malignant neoplasm of liver and intrahepatic bile duct: Secondary | ICD-10-CM | POA: Diagnosis not present

## 2020-02-14 DIAGNOSIS — Z5111 Encounter for antineoplastic chemotherapy: Secondary | ICD-10-CM | POA: Diagnosis not present

## 2020-02-21 DIAGNOSIS — E871 Hypo-osmolality and hyponatremia: Secondary | ICD-10-CM | POA: Diagnosis not present

## 2020-02-21 DIAGNOSIS — C787 Secondary malignant neoplasm of liver and intrahepatic bile duct: Secondary | ICD-10-CM | POA: Diagnosis not present

## 2020-02-21 DIAGNOSIS — Z95828 Presence of other vascular implants and grafts: Secondary | ICD-10-CM | POA: Diagnosis not present

## 2020-02-21 DIAGNOSIS — Z79899 Other long term (current) drug therapy: Secondary | ICD-10-CM | POA: Diagnosis not present

## 2020-02-21 DIAGNOSIS — G893 Neoplasm related pain (acute) (chronic): Secondary | ICD-10-CM | POA: Diagnosis not present

## 2020-02-21 DIAGNOSIS — C3491 Malignant neoplasm of unspecified part of right bronchus or lung: Secondary | ICD-10-CM | POA: Diagnosis not present

## 2020-02-21 DIAGNOSIS — Z720 Tobacco use: Secondary | ICD-10-CM | POA: Diagnosis not present

## 2020-02-21 DIAGNOSIS — C3431 Malignant neoplasm of lower lobe, right bronchus or lung: Secondary | ICD-10-CM | POA: Diagnosis not present

## 2020-02-21 DIAGNOSIS — C7951 Secondary malignant neoplasm of bone: Secondary | ICD-10-CM | POA: Diagnosis not present

## 2020-02-21 DIAGNOSIS — G4700 Insomnia, unspecified: Secondary | ICD-10-CM | POA: Diagnosis not present

## 2020-02-21 DIAGNOSIS — Z5111 Encounter for antineoplastic chemotherapy: Secondary | ICD-10-CM | POA: Diagnosis not present

## 2020-02-21 DIAGNOSIS — Z923 Personal history of irradiation: Secondary | ICD-10-CM | POA: Diagnosis not present

## 2020-03-04 DIAGNOSIS — C3431 Malignant neoplasm of lower lobe, right bronchus or lung: Secondary | ICD-10-CM | POA: Diagnosis not present

## 2020-03-04 DIAGNOSIS — N2889 Other specified disorders of kidney and ureter: Secondary | ICD-10-CM | POA: Diagnosis not present

## 2020-03-04 DIAGNOSIS — C7951 Secondary malignant neoplasm of bone: Secondary | ICD-10-CM | POA: Diagnosis not present

## 2020-03-04 DIAGNOSIS — R918 Other nonspecific abnormal finding of lung field: Secondary | ICD-10-CM | POA: Diagnosis not present

## 2020-03-06 DIAGNOSIS — E871 Hypo-osmolality and hyponatremia: Secondary | ICD-10-CM | POA: Diagnosis not present

## 2020-03-06 DIAGNOSIS — Z79899 Other long term (current) drug therapy: Secondary | ICD-10-CM | POA: Diagnosis not present

## 2020-03-06 DIAGNOSIS — C7951 Secondary malignant neoplasm of bone: Secondary | ICD-10-CM | POA: Diagnosis not present

## 2020-03-06 DIAGNOSIS — C787 Secondary malignant neoplasm of liver and intrahepatic bile duct: Secondary | ICD-10-CM | POA: Diagnosis not present

## 2020-03-06 DIAGNOSIS — Z5111 Encounter for antineoplastic chemotherapy: Secondary | ICD-10-CM | POA: Diagnosis not present

## 2020-03-06 DIAGNOSIS — Z95828 Presence of other vascular implants and grafts: Secondary | ICD-10-CM | POA: Diagnosis not present

## 2020-03-06 DIAGNOSIS — C799 Secondary malignant neoplasm of unspecified site: Secondary | ICD-10-CM | POA: Diagnosis not present

## 2020-03-06 DIAGNOSIS — C3431 Malignant neoplasm of lower lobe, right bronchus or lung: Secondary | ICD-10-CM | POA: Diagnosis not present

## 2020-03-06 DIAGNOSIS — Z923 Personal history of irradiation: Secondary | ICD-10-CM | POA: Diagnosis not present

## 2020-03-06 DIAGNOSIS — G893 Neoplasm related pain (acute) (chronic): Secondary | ICD-10-CM | POA: Diagnosis not present

## 2020-03-06 DIAGNOSIS — G4700 Insomnia, unspecified: Secondary | ICD-10-CM | POA: Diagnosis not present

## 2020-03-06 DIAGNOSIS — D649 Anemia, unspecified: Secondary | ICD-10-CM | POA: Diagnosis not present

## 2020-03-06 DIAGNOSIS — C3491 Malignant neoplasm of unspecified part of right bronchus or lung: Secondary | ICD-10-CM | POA: Diagnosis not present

## 2020-03-13 DIAGNOSIS — E871 Hypo-osmolality and hyponatremia: Secondary | ICD-10-CM | POA: Diagnosis not present

## 2020-03-13 DIAGNOSIS — Z5111 Encounter for antineoplastic chemotherapy: Secondary | ICD-10-CM | POA: Diagnosis not present

## 2020-03-13 DIAGNOSIS — Z923 Personal history of irradiation: Secondary | ICD-10-CM | POA: Diagnosis not present

## 2020-03-13 DIAGNOSIS — R1312 Dysphagia, oropharyngeal phase: Secondary | ICD-10-CM | POA: Diagnosis not present

## 2020-03-13 DIAGNOSIS — C7951 Secondary malignant neoplasm of bone: Secondary | ICD-10-CM | POA: Diagnosis not present

## 2020-03-13 DIAGNOSIS — C799 Secondary malignant neoplasm of unspecified site: Secondary | ICD-10-CM | POA: Diagnosis not present

## 2020-03-13 DIAGNOSIS — G4700 Insomnia, unspecified: Secondary | ICD-10-CM | POA: Diagnosis not present

## 2020-03-13 DIAGNOSIS — C3431 Malignant neoplasm of lower lobe, right bronchus or lung: Secondary | ICD-10-CM | POA: Diagnosis not present

## 2020-03-13 DIAGNOSIS — C3491 Malignant neoplasm of unspecified part of right bronchus or lung: Secondary | ICD-10-CM | POA: Diagnosis not present

## 2020-03-13 DIAGNOSIS — C787 Secondary malignant neoplasm of liver and intrahepatic bile duct: Secondary | ICD-10-CM | POA: Diagnosis not present

## 2020-03-13 DIAGNOSIS — G893 Neoplasm related pain (acute) (chronic): Secondary | ICD-10-CM | POA: Diagnosis not present

## 2020-03-13 DIAGNOSIS — Z95828 Presence of other vascular implants and grafts: Secondary | ICD-10-CM | POA: Diagnosis not present

## 2020-03-27 DIAGNOSIS — C3431 Malignant neoplasm of lower lobe, right bronchus or lung: Secondary | ICD-10-CM | POA: Diagnosis not present

## 2020-03-27 DIAGNOSIS — Z79899 Other long term (current) drug therapy: Secondary | ICD-10-CM | POA: Diagnosis not present

## 2020-03-27 DIAGNOSIS — D649 Anemia, unspecified: Secondary | ICD-10-CM | POA: Diagnosis not present

## 2020-03-27 DIAGNOSIS — G4700 Insomnia, unspecified: Secondary | ICD-10-CM | POA: Diagnosis not present

## 2020-03-27 DIAGNOSIS — C7951 Secondary malignant neoplasm of bone: Secondary | ICD-10-CM | POA: Diagnosis not present

## 2020-03-27 DIAGNOSIS — C799 Secondary malignant neoplasm of unspecified site: Secondary | ICD-10-CM | POA: Diagnosis not present

## 2020-03-27 DIAGNOSIS — C787 Secondary malignant neoplasm of liver and intrahepatic bile duct: Secondary | ICD-10-CM | POA: Diagnosis not present

## 2020-03-27 DIAGNOSIS — G893 Neoplasm related pain (acute) (chronic): Secondary | ICD-10-CM | POA: Diagnosis not present

## 2020-03-27 DIAGNOSIS — C3491 Malignant neoplasm of unspecified part of right bronchus or lung: Secondary | ICD-10-CM | POA: Diagnosis not present

## 2020-03-27 DIAGNOSIS — E871 Hypo-osmolality and hyponatremia: Secondary | ICD-10-CM | POA: Diagnosis not present

## 2020-03-27 DIAGNOSIS — Z5111 Encounter for antineoplastic chemotherapy: Secondary | ICD-10-CM | POA: Diagnosis not present

## 2020-04-03 DIAGNOSIS — G4700 Insomnia, unspecified: Secondary | ICD-10-CM | POA: Diagnosis not present

## 2020-04-03 DIAGNOSIS — Z95828 Presence of other vascular implants and grafts: Secondary | ICD-10-CM | POA: Diagnosis not present

## 2020-04-03 DIAGNOSIS — E871 Hypo-osmolality and hyponatremia: Secondary | ICD-10-CM | POA: Diagnosis not present

## 2020-04-03 DIAGNOSIS — G893 Neoplasm related pain (acute) (chronic): Secondary | ICD-10-CM | POA: Diagnosis not present

## 2020-04-03 DIAGNOSIS — Z5111 Encounter for antineoplastic chemotherapy: Secondary | ICD-10-CM | POA: Diagnosis not present

## 2020-04-03 DIAGNOSIS — C799 Secondary malignant neoplasm of unspecified site: Secondary | ICD-10-CM | POA: Diagnosis not present

## 2020-04-03 DIAGNOSIS — Z79899 Other long term (current) drug therapy: Secondary | ICD-10-CM | POA: Diagnosis not present

## 2020-04-03 DIAGNOSIS — C3431 Malignant neoplasm of lower lobe, right bronchus or lung: Secondary | ICD-10-CM | POA: Diagnosis not present

## 2020-04-03 DIAGNOSIS — C787 Secondary malignant neoplasm of liver and intrahepatic bile duct: Secondary | ICD-10-CM | POA: Diagnosis not present

## 2020-04-03 DIAGNOSIS — C7951 Secondary malignant neoplasm of bone: Secondary | ICD-10-CM | POA: Diagnosis not present

## 2020-04-03 DIAGNOSIS — Z923 Personal history of irradiation: Secondary | ICD-10-CM | POA: Diagnosis not present

## 2020-04-03 DIAGNOSIS — C3491 Malignant neoplasm of unspecified part of right bronchus or lung: Secondary | ICD-10-CM | POA: Diagnosis not present

## 2020-04-17 DIAGNOSIS — R1312 Dysphagia, oropharyngeal phase: Secondary | ICD-10-CM | POA: Diagnosis not present

## 2020-04-17 DIAGNOSIS — C7951 Secondary malignant neoplasm of bone: Secondary | ICD-10-CM | POA: Diagnosis not present

## 2020-04-17 DIAGNOSIS — E876 Hypokalemia: Secondary | ICD-10-CM | POA: Diagnosis not present

## 2020-04-17 DIAGNOSIS — C799 Secondary malignant neoplasm of unspecified site: Secondary | ICD-10-CM | POA: Diagnosis not present

## 2020-04-17 DIAGNOSIS — C3491 Malignant neoplasm of unspecified part of right bronchus or lung: Secondary | ICD-10-CM | POA: Diagnosis not present

## 2020-04-17 DIAGNOSIS — C787 Secondary malignant neoplasm of liver and intrahepatic bile duct: Secondary | ICD-10-CM | POA: Diagnosis not present

## 2020-04-17 DIAGNOSIS — G893 Neoplasm related pain (acute) (chronic): Secondary | ICD-10-CM | POA: Diagnosis not present

## 2020-04-17 DIAGNOSIS — Z5111 Encounter for antineoplastic chemotherapy: Secondary | ICD-10-CM | POA: Diagnosis not present

## 2020-04-17 DIAGNOSIS — G4700 Insomnia, unspecified: Secondary | ICD-10-CM | POA: Diagnosis not present

## 2020-04-17 DIAGNOSIS — C3431 Malignant neoplasm of lower lobe, right bronchus or lung: Secondary | ICD-10-CM | POA: Diagnosis not present

## 2020-04-17 DIAGNOSIS — Z79899 Other long term (current) drug therapy: Secondary | ICD-10-CM | POA: Diagnosis not present

## 2020-05-06 DIAGNOSIS — C3491 Malignant neoplasm of unspecified part of right bronchus or lung: Secondary | ICD-10-CM | POA: Diagnosis not present

## 2020-05-06 DIAGNOSIS — C3481 Malignant neoplasm of overlapping sites of right bronchus and lung: Secondary | ICD-10-CM | POA: Diagnosis not present

## 2020-05-06 DIAGNOSIS — C787 Secondary malignant neoplasm of liver and intrahepatic bile duct: Secondary | ICD-10-CM | POA: Diagnosis not present

## 2020-05-06 DIAGNOSIS — C7951 Secondary malignant neoplasm of bone: Secondary | ICD-10-CM | POA: Diagnosis not present

## 2020-05-08 DIAGNOSIS — C787 Secondary malignant neoplasm of liver and intrahepatic bile duct: Secondary | ICD-10-CM | POA: Diagnosis not present

## 2020-05-08 DIAGNOSIS — C799 Secondary malignant neoplasm of unspecified site: Secondary | ICD-10-CM | POA: Diagnosis not present

## 2020-05-08 DIAGNOSIS — E871 Hypo-osmolality and hyponatremia: Secondary | ICD-10-CM | POA: Diagnosis not present

## 2020-05-08 DIAGNOSIS — G4700 Insomnia, unspecified: Secondary | ICD-10-CM | POA: Diagnosis not present

## 2020-05-08 DIAGNOSIS — C7951 Secondary malignant neoplasm of bone: Secondary | ICD-10-CM | POA: Diagnosis not present

## 2020-05-08 DIAGNOSIS — Z95828 Presence of other vascular implants and grafts: Secondary | ICD-10-CM | POA: Diagnosis not present

## 2020-05-08 DIAGNOSIS — C3431 Malignant neoplasm of lower lobe, right bronchus or lung: Secondary | ICD-10-CM | POA: Diagnosis not present

## 2020-05-08 DIAGNOSIS — C3491 Malignant neoplasm of unspecified part of right bronchus or lung: Secondary | ICD-10-CM | POA: Diagnosis not present

## 2020-05-08 DIAGNOSIS — Z801 Family history of malignant neoplasm of trachea, bronchus and lung: Secondary | ICD-10-CM | POA: Diagnosis not present

## 2020-05-08 DIAGNOSIS — G893 Neoplasm related pain (acute) (chronic): Secondary | ICD-10-CM | POA: Diagnosis not present

## 2020-05-08 DIAGNOSIS — Z923 Personal history of irradiation: Secondary | ICD-10-CM | POA: Diagnosis not present

## 2020-05-15 DIAGNOSIS — C787 Secondary malignant neoplasm of liver and intrahepatic bile duct: Secondary | ICD-10-CM | POA: Diagnosis not present

## 2020-05-15 DIAGNOSIS — C799 Secondary malignant neoplasm of unspecified site: Secondary | ICD-10-CM | POA: Diagnosis not present

## 2020-05-15 DIAGNOSIS — Z923 Personal history of irradiation: Secondary | ICD-10-CM | POA: Diagnosis not present

## 2020-05-15 DIAGNOSIS — C7951 Secondary malignant neoplasm of bone: Secondary | ICD-10-CM | POA: Diagnosis not present

## 2020-05-15 DIAGNOSIS — C3431 Malignant neoplasm of lower lobe, right bronchus or lung: Secondary | ICD-10-CM | POA: Diagnosis not present

## 2020-05-15 DIAGNOSIS — Z79899 Other long term (current) drug therapy: Secondary | ICD-10-CM | POA: Diagnosis not present

## 2020-05-29 DIAGNOSIS — E871 Hypo-osmolality and hyponatremia: Secondary | ICD-10-CM | POA: Diagnosis not present

## 2020-05-29 DIAGNOSIS — F17210 Nicotine dependence, cigarettes, uncomplicated: Secondary | ICD-10-CM | POA: Diagnosis not present

## 2020-05-29 DIAGNOSIS — C3491 Malignant neoplasm of unspecified part of right bronchus or lung: Secondary | ICD-10-CM | POA: Diagnosis not present

## 2020-05-29 DIAGNOSIS — G4700 Insomnia, unspecified: Secondary | ICD-10-CM | POA: Diagnosis not present

## 2020-05-29 DIAGNOSIS — G893 Neoplasm related pain (acute) (chronic): Secondary | ICD-10-CM | POA: Diagnosis not present

## 2020-05-29 DIAGNOSIS — C787 Secondary malignant neoplasm of liver and intrahepatic bile duct: Secondary | ICD-10-CM | POA: Diagnosis not present

## 2020-05-29 DIAGNOSIS — C7951 Secondary malignant neoplasm of bone: Secondary | ICD-10-CM | POA: Diagnosis not present

## 2020-05-29 DIAGNOSIS — Z79899 Other long term (current) drug therapy: Secondary | ICD-10-CM | POA: Diagnosis not present

## 2020-05-29 DIAGNOSIS — C3431 Malignant neoplasm of lower lobe, right bronchus or lung: Secondary | ICD-10-CM | POA: Diagnosis not present

## 2020-06-05 DIAGNOSIS — C787 Secondary malignant neoplasm of liver and intrahepatic bile duct: Secondary | ICD-10-CM | POA: Diagnosis not present

## 2020-06-05 DIAGNOSIS — C7951 Secondary malignant neoplasm of bone: Secondary | ICD-10-CM | POA: Diagnosis not present

## 2020-06-05 DIAGNOSIS — Z5111 Encounter for antineoplastic chemotherapy: Secondary | ICD-10-CM | POA: Diagnosis not present

## 2020-06-05 DIAGNOSIS — C3431 Malignant neoplasm of lower lobe, right bronchus or lung: Secondary | ICD-10-CM | POA: Diagnosis not present

## 2020-06-19 DIAGNOSIS — Z79899 Other long term (current) drug therapy: Secondary | ICD-10-CM | POA: Diagnosis not present

## 2020-06-19 DIAGNOSIS — G4700 Insomnia, unspecified: Secondary | ICD-10-CM | POA: Diagnosis not present

## 2020-06-19 DIAGNOSIS — C787 Secondary malignant neoplasm of liver and intrahepatic bile duct: Secondary | ICD-10-CM | POA: Diagnosis not present

## 2020-06-19 DIAGNOSIS — C799 Secondary malignant neoplasm of unspecified site: Secondary | ICD-10-CM | POA: Diagnosis not present

## 2020-06-19 DIAGNOSIS — E871 Hypo-osmolality and hyponatremia: Secondary | ICD-10-CM | POA: Diagnosis not present

## 2020-06-19 DIAGNOSIS — C3431 Malignant neoplasm of lower lobe, right bronchus or lung: Secondary | ICD-10-CM | POA: Diagnosis not present

## 2020-06-19 DIAGNOSIS — C3491 Malignant neoplasm of unspecified part of right bronchus or lung: Secondary | ICD-10-CM | POA: Diagnosis not present

## 2020-06-19 DIAGNOSIS — G893 Neoplasm related pain (acute) (chronic): Secondary | ICD-10-CM | POA: Diagnosis not present

## 2020-06-19 DIAGNOSIS — C7951 Secondary malignant neoplasm of bone: Secondary | ICD-10-CM | POA: Diagnosis not present

## 2020-06-19 DIAGNOSIS — F17210 Nicotine dependence, cigarettes, uncomplicated: Secondary | ICD-10-CM | POA: Diagnosis not present

## 2020-06-26 DIAGNOSIS — C7951 Secondary malignant neoplasm of bone: Secondary | ICD-10-CM | POA: Diagnosis not present

## 2020-06-26 DIAGNOSIS — C3431 Malignant neoplasm of lower lobe, right bronchus or lung: Secondary | ICD-10-CM | POA: Diagnosis not present

## 2020-06-26 DIAGNOSIS — Z5111 Encounter for antineoplastic chemotherapy: Secondary | ICD-10-CM | POA: Diagnosis not present

## 2020-06-26 DIAGNOSIS — C787 Secondary malignant neoplasm of liver and intrahepatic bile duct: Secondary | ICD-10-CM | POA: Diagnosis not present

## 2020-07-08 DIAGNOSIS — C3491 Malignant neoplasm of unspecified part of right bronchus or lung: Secondary | ICD-10-CM | POA: Diagnosis not present

## 2020-07-08 DIAGNOSIS — R918 Other nonspecific abnormal finding of lung field: Secondary | ICD-10-CM | POA: Diagnosis not present

## 2020-07-08 DIAGNOSIS — C7951 Secondary malignant neoplasm of bone: Secondary | ICD-10-CM | POA: Diagnosis not present

## 2020-07-08 DIAGNOSIS — C799 Secondary malignant neoplasm of unspecified site: Secondary | ICD-10-CM | POA: Diagnosis not present

## 2020-07-10 DIAGNOSIS — C7951 Secondary malignant neoplasm of bone: Secondary | ICD-10-CM | POA: Diagnosis not present

## 2020-07-10 DIAGNOSIS — E871 Hypo-osmolality and hyponatremia: Secondary | ICD-10-CM | POA: Diagnosis not present

## 2020-07-10 DIAGNOSIS — F17210 Nicotine dependence, cigarettes, uncomplicated: Secondary | ICD-10-CM | POA: Diagnosis not present

## 2020-07-10 DIAGNOSIS — C787 Secondary malignant neoplasm of liver and intrahepatic bile duct: Secondary | ICD-10-CM | POA: Diagnosis not present

## 2020-07-10 DIAGNOSIS — Z23 Encounter for immunization: Secondary | ICD-10-CM | POA: Diagnosis not present

## 2020-07-10 DIAGNOSIS — E876 Hypokalemia: Secondary | ICD-10-CM | POA: Diagnosis not present

## 2020-07-10 DIAGNOSIS — Z79899 Other long term (current) drug therapy: Secondary | ICD-10-CM | POA: Diagnosis not present

## 2020-07-10 DIAGNOSIS — C3431 Malignant neoplasm of lower lobe, right bronchus or lung: Secondary | ICD-10-CM | POA: Diagnosis not present

## 2020-07-10 DIAGNOSIS — C3491 Malignant neoplasm of unspecified part of right bronchus or lung: Secondary | ICD-10-CM | POA: Diagnosis not present

## 2020-07-10 DIAGNOSIS — G893 Neoplasm related pain (acute) (chronic): Secondary | ICD-10-CM | POA: Diagnosis not present

## 2020-07-10 DIAGNOSIS — G4700 Insomnia, unspecified: Secondary | ICD-10-CM | POA: Diagnosis not present

## 2020-07-10 DIAGNOSIS — C799 Secondary malignant neoplasm of unspecified site: Secondary | ICD-10-CM | POA: Diagnosis not present

## 2020-07-17 DIAGNOSIS — C7951 Secondary malignant neoplasm of bone: Secondary | ICD-10-CM | POA: Diagnosis not present

## 2020-07-17 DIAGNOSIS — Z5111 Encounter for antineoplastic chemotherapy: Secondary | ICD-10-CM | POA: Diagnosis not present

## 2020-07-17 DIAGNOSIS — C787 Secondary malignant neoplasm of liver and intrahepatic bile duct: Secondary | ICD-10-CM | POA: Diagnosis not present

## 2020-07-17 DIAGNOSIS — C3431 Malignant neoplasm of lower lobe, right bronchus or lung: Secondary | ICD-10-CM | POA: Diagnosis not present

## 2020-07-20 ENCOUNTER — Ambulatory Visit
Admission: EM | Admit: 2020-07-20 | Discharge: 2020-07-20 | Disposition: A | Discharge status: Home / Self Care | Payer: Medicare Other | Attending: Emergency Medicine | Admitting: Emergency Medicine

## 2020-07-20 ENCOUNTER — Ambulatory Visit (INDEPENDENT_AMBULATORY_CARE_PROVIDER_SITE_OTHER): Payer: Medicare Other

## 2020-07-20 ENCOUNTER — Encounter: Payer: Self-pay | Admitting: Emergency Medicine

## 2020-07-20 ENCOUNTER — Other Ambulatory Visit: Payer: Self-pay

## 2020-07-20 DIAGNOSIS — M79671 Pain in right foot: Secondary | ICD-10-CM

## 2020-07-20 DIAGNOSIS — S92351A Displaced fracture of fifth metatarsal bone, right foot, initial encounter for closed fracture: Secondary | ICD-10-CM

## 2020-07-20 DIAGNOSIS — S92354A Nondisplaced fracture of fifth metatarsal bone, right foot, initial encounter for closed fracture: Secondary | ICD-10-CM | POA: Diagnosis not present

## 2020-07-20 DIAGNOSIS — S99921A Unspecified injury of right foot, initial encounter: Secondary | ICD-10-CM | POA: Diagnosis not present

## 2020-07-20 DIAGNOSIS — X501XXA Overexertion from prolonged static or awkward postures, initial encounter: Secondary | ICD-10-CM | POA: Diagnosis not present

## 2020-07-20 MED ORDER — IBUPROFEN 800 MG PO TABS: 800.0000 mg | ORAL_TABLET | Freq: Three times a day (TID) | ORAL | 0 refills | Status: DC

## 2020-07-20 NOTE — Discharge Instructions (Addendum)
Take OTC Tylenol/ibuprofen as needed for pain Follow RICE instruction that is attached Follow-up with PCP Return or go to ED for worsening of symptoms

## 2020-07-20 NOTE — ED Provider Notes (Signed)
[place]   [id number] 07/20/20 Arrival Time: 1222   Chief Complaint  Patient presents with  . Foot Injury    SUBJECTIVE: History from: patient.  Cynthia Kelley is a 64 y.o. female who presented to the urgent care for complaint of right foot pain for the past 5 days.  Report she twisted her foot while walking.  She localizes the pain to the right foot.  She describes the pain as constant and achy.  She has tried OTC medications without relief.  Her symptoms are made worse with ROM.  She denies similar symptoms in the past.  Denies chills, fever, nausea, vomiting, diarrhea  ROS: As per HPI.  All other pertinent ROS negative.     Past Medical History:  Diagnosis Date  . Arthritis   . Lung cancer, primary, with metastasis from lung to other site [facility]) 01/16/2018  . Osteopenia 05/15/2013  . Osteoporosis    osteopenia  . Tobacco abuse 12/14/2016   Past Surgical History:  Procedure Laterality Date  . FRACTURE SURGERY     torn ACL  . KNEE ARTHROSCOPY     No Known Allergies No current facility-administered medications on file prior to encounter.   Current Outpatient Medications on File Prior to Encounter  Medication Sig Dispense Refill  . calcium carbonate (OS-CAL - DOSED IN MG OF ELEMENTAL CALCIUM) 1250 (500 Ca) MG tablet Take 1 tablet by mouth 3 (three) times daily with meals.    Marland Kitchen dexamethasone (DECADRON) 4 MG tablet Take 4 mg by mouth 2 (two) times daily with a meal.    . diphenhydrAMINE (BENADRYL) 25 mg capsule Take 50 mg by mouth at bedtime as needed for sleep.    Marland Kitchen HYDROcodone-acetaminophen (NORCO) 7.5-325 MG tablet Take 1 tablet by mouth every 6 (six) hours as needed for moderate pain. (Patient not taking: Reported on 07/19/2018) 40 tablet 0  . lidocaine-prilocaine (EMLA) cream Apply 1 application topically as needed.    Marland Kitchen LORazepam (ATIVAN) 1 MG tablet Take 1 mg by mouth every 8 (eight) hours.    . magnesium hydroxide (MILK OF MAGNESIA) 400 MG/5ML suspension Take  by mouth daily as needed for mild constipation.    . megestrol (MEGACE) 400 MG/10ML suspension Take by mouth daily.    . Multiple Vitamins-Minerals (MULTIVITAMIN WITH MINERALS) tablet Take 1 tablet by mouth daily.    . ondansetron (ZOFRAN) 8 MG tablet Take by mouth every 8 (eight) hours as needed for nausea or vomiting.    Vladimir Faster Glycol-Propyl Glycol (SYSTANE ULTRA) 0.4-0.3 % SOLN Apply to eye.    . potassium chloride SA (K-DUR,KLOR-CON) 20 MEQ tablet Take 20 mEq by mouth 2 (two) times daily.    . Prasterone (INTRAROSA) 6.5 MG INST Place 1 applicator vaginally daily. 28 each 6  . prednisoLONE acetate (PRED FORTE) 1 % ophthalmic suspension 1 drop 4 (four) times daily.     Social History   Socioeconomic History  . Marital status: Married    Spouse name: [name]  . Number of children: 1  . Years of education: 78  . Highest education level: Not on file  Occupational History  . Occupation: Air traffic controller    Comment: retired  Tobacco Use  . Smoking status: Current Every Day Smoker    Packs/day: 0.50    Years: 20.00    Pack years: 10.00    Types: Cigarettes    Start date: 09/28/1963  . Smokeless tobacco: Never Used  Vaping Use  . Vaping Use: Never used  Substance  and Sexual Activity  . Alcohol use: Yes    Alcohol/week: 20.0 standard drinks    Types: 20 Cans of beer per week    Comment: 3-4 beers a day  . Drug use: No  . Sexual activity: Yes    Birth control/protection: Post-menopausal  Other Topics Concern  . Not on file  Social History Narrative   Retired Air traffic controller   Lives with husband [name]   Daughter Marcelino Scot is in [place] to walk   Social Determinants of Health   Financial Resource Strain:   . Difficulty of Paying Living Expenses: Not on file  Food Insecurity:   . Worried About Charity fundraiser in the Last Year: Not on file  . Ran Out of Food in the Last Year: Not on file  Transportation Needs:   . Lack of Transportation (Medical): Not on file  . Lack of  Transportation (Non-Medical): Not on file  Physical Activity:   . Days of Exercise per Week: Not on file  . Minutes of Exercise per Session: Not on file  Stress:   . Feeling of Stress : Not on file  Social Connections:   . Frequency of Communication with Friends and Family: Not on file  . Frequency of Social Gatherings with Friends and Family: Not on file  . Attends Religious Services: Not on file  . Active Member of Clubs or Organizations: Not on file  . Attends Archivist Meetings: Not on file  . Marital Status: Not on file  Intimate Partner Violence:   . Fear of Current or Ex-Partner: Not on file  . Emotionally Abused: Not on file  . Physically Abused: Not on file  . Sexually Abused: Not on file   Family History  Problem Relation Age of Onset  . Cancer Father        died in 74's everywhere  . Heart disease Mother   . Cancer Mother        lung  . Stroke Mother   . Asthma Daughter     OBJECTIVE:  Vitals:   07/20/20 1252 07/20/20 1253  BP:  (!) 150/72  Pulse:  100  Resp:  17  Temp:  98.5 F (36.9 C)  TempSrc:  Oral  SpO2:  97%  Weight: 126 lb (57.2 kg)   Height: 5\' 6"  (1.676 m)      Physical Exam Vitals and nursing note reviewed.  Constitutional:      General: She is not in acute distress.    Appearance: Normal appearance. She is normal weight. She is not ill-appearing, toxic-appearing or diaphoretic.  HENT:     Head: Normocephalic.  Cardiovascular:     Rate and Rhythm: Normal rate and regular rhythm.     Pulses: Normal pulses.     Heart sounds: Normal heart sounds. No murmur heard.  No friction rub. No gallop.   Pulmonary:     Effort: Pulmonary effort is normal. No respiratory distress.     Breath sounds: Normal breath sounds. No stridor. No wheezing, rhonchi or rales.  Chest:     Chest wall: No tenderness.  Musculoskeletal:        General: Swelling and tenderness present.     Right foot: Swelling and tenderness present.     Left foot:  Normal.     Comments: The right foot is with obvious deformity compared to the left foot.  Swelling is present.  There is no ecchymosis, open wound, lesion, warmth, or surface  trauma present.  Limited range of motion due to pain.  Neurovascular status intact.  Neurological:     Mental Status: She is alert and oriented to person, place, and time.     LABS:  No results found for this or any previous visit (from the past 24 hour(s)).   RADIOLOGY:  DG Foot Complete Right  Result Date: 07/20/2020 CLINICAL DATA:  Right foot pain after injury last week. EXAM: RIGHT FOOT COMPLETE - 3+ VIEW COMPARISON:  None. FINDINGS: Nondisplaced fracture is seen involving the proximal base of the fifth metatarsal. Mild degenerative changes seen involving the first metatarsophalangeal joint. IMPRESSION: Nondisplaced fifth metatarsal fracture. Electronically Signed   By: Marijo Conception M.D.   On: 07/20/2020 13:17   Right foot x-ray is positive for nondisplaced fifth metatarsal fracture.  I have reviewed the x-ray myself and the radiologist interpretation.  I am in agreement with the radiologist interpretation.   ASSESSMENT & PLAN:  1. Right foot pain   2. Injury of right foot, initial encounter   3. Closed fracture of base of fifth metatarsal bone of right foot, initial encounter     Meds ordered this encounter  Medications  . ibuprofen (ADVIL) 800 MG tablet    Sig: Take 1 tablet (800 mg total) by mouth 3 (three) times daily.    Dispense:  30 tablet    Refill:  0    Discharge instructions  Take OTC Tylenol/ibuprofen as needed for pain Follow RICE instruction that is attached Follow-up with PCP Return or go to ED for worsening of symptoms  Reviewed expectations re: course of current medical issues. Questions answered. Outlined signs and symptoms indicating need for more acute intervention. Patient verbalized understanding. After Visit Summary given.         Emerson Monte,  [place] 07/20/20 1338

## 2020-07-20 NOTE — ED Triage Notes (Signed)
Pt twisted RT foot on Tue.  Having pain, redness and swelling since

## 2020-07-29 ENCOUNTER — Ambulatory Visit: Payer: Medicare Other | Admitting: Orthopaedic Surgery

## 2020-07-29 ENCOUNTER — Other Ambulatory Visit: Payer: Self-pay

## 2020-07-29 ENCOUNTER — Encounter: Payer: Self-pay | Admitting: Orthopaedic Surgery

## 2020-07-29 ENCOUNTER — Ambulatory Visit: Payer: Medicare Other

## 2020-07-29 VITALS — BP 123/68 | HR 89 | Ht 66.0 in | Wt 126.0 lb

## 2020-07-29 DIAGNOSIS — S92351A Displaced fracture of fifth metatarsal bone, right foot, initial encounter for closed fracture: Secondary | ICD-10-CM

## 2020-07-29 NOTE — Progress Notes (Signed)
Subjective:    Patient ID: Cynthia Kelley, female    DOB: [birth year], 64 y.o.   MRN: [id number]  HPI She twisted her foot and hurt her lateral right foot on 07-20-20.  She was seen and had x-rays done.  X-rays showed Nondisplaced fifth metatarsal fracture.  She was given a CAM walker.  She does not like the CAM walker and has some pain still.  She has no new trauma, no redness.   Review of Systems  Constitutional: Positive for activity change.  Musculoskeletal: Positive for arthralgias, gait problem and joint swelling.  All other systems reviewed and are negative.  For Review of Systems, all other systems reviewed and are negative.  The following is a summary of the past history medically, past history surgically, known current medicines, social history and family history.  This information is gathered electronically by the computer from prior information and documentation.  I review this each visit and have found including this information at this point in the chart is beneficial and informative.   Past Medical History:  Diagnosis Date  . Arthritis   . Lung cancer, primary, with metastasis from lung to other site [facility]) 01/16/2018  . Osteopenia 05/15/2013  . Osteoporosis    osteopenia  . Tobacco abuse 12/14/2016    Past Surgical History:  Procedure Laterality Date  . FRACTURE SURGERY     torn ACL  . KNEE ARTHROSCOPY      Current Outpatient Medications on File Prior to Visit  Medication Sig Dispense Refill  . calcium carbonate (OS-CAL - DOSED IN MG OF ELEMENTAL CALCIUM) 1250 (500 Ca) MG tablet Take 1 tablet by mouth 3 (three) times daily with meals.    Marland Kitchen LORazepam (ATIVAN) 1 MG tablet Take 1 mg by mouth every 8 (eight) hours.    . Multiple Vitamins-Minerals (MULTIVITAMIN WITH MINERALS) tablet Take 1 tablet by mouth daily.    . potassium chloride SA (K-DUR,KLOR-CON) 20 MEQ tablet Take 20 mEq by mouth 2 (two) times daily.    Marland Kitchen dexamethasone (DECADRON) 4 MG tablet Take 4 mg by mouth 2  (two) times daily with a meal. (Patient not taking: Reported on 07/29/2020)    . diphenhydrAMINE (BENADRYL) 25 mg capsule Take 50 mg by mouth at bedtime as needed for sleep. (Patient not taking: Reported on 07/29/2020)    . HYDROcodone-acetaminophen (NORCO) 7.5-325 MG tablet Take 1 tablet by mouth every 6 (six) hours as needed for moderate pain. (Patient not taking: Reported on 07/19/2018) 40 tablet 0  . ibuprofen (ADVIL) 800 MG tablet Take 1 tablet (800 mg total) by mouth 3 (three) times daily. (Patient not taking: Reported on 07/29/2020) 30 tablet 0  . lidocaine-prilocaine (EMLA) cream Apply 1 application topically as needed. (Patient not taking: Reported on 07/29/2020)    . magnesium hydroxide (MILK OF MAGNESIA) 400 MG/5ML suspension Take by mouth daily as needed for mild constipation. (Patient not taking: Reported on 07/29/2020)    . megestrol (MEGACE) 400 MG/10ML suspension Take by mouth daily. (Patient not taking: Reported on 07/29/2020)    . ondansetron (ZOFRAN) 8 MG tablet Take by mouth every 8 (eight) hours as needed for nausea or vomiting. (Patient not taking: Reported on 07/29/2020)    . Polyethyl Glycol-Propyl Glycol (SYSTANE ULTRA) 0.4-0.3 % SOLN Apply to eye. (Patient not taking: Reported on 07/29/2020)    . Prasterone (INTRAROSA) 6.5 MG INST Place 1 applicator vaginally daily. (Patient not taking: Reported on 07/29/2020) 28 each 6  . prednisoLONE acetate (PRED FORTE) 1 %  ophthalmic suspension 1 drop 4 (four) times daily. (Patient not taking: Reported on 07/29/2020)     No current facility-administered medications on file prior to visit.    Social History   Socioeconomic History  . Marital status: Married    Spouse name: [name]  . Number of children: 1  . Years of education: 55  . Highest education level: Not on file  Occupational History  . Occupation: Air traffic controller    Comment: retired  Tobacco Use  . Smoking status: Current Every Day Smoker    Packs/day: 0.50    Years: 20.00    Pack  years: 10.00    Types: Cigarettes    Start date: 09/28/1963  . Smokeless tobacco: Never Used  Vaping Use  . Vaping Use: Never used  Substance and Sexual Activity  . Alcohol use: Yes    Alcohol/week: 20.0 standard drinks    Types: 20 Cans of beer per week    Comment: 3-4 beers a day  . Drug use: No  . Sexual activity: Yes    Birth control/protection: Post-menopausal  Other Topics Concern  . Not on file  Social History Narrative   Retired Air traffic controller   Lives with husband [name]   Daughter Marcelino Scot is in [place] to walk   Social Determinants of Health   Financial Resource Strain:   . Difficulty of Paying Living Expenses: Not on file  Food Insecurity:   . Worried About Charity fundraiser in the Last Year: Not on file  . Ran Out of Food in the Last Year: Not on file  Transportation Needs:   . Lack of Transportation (Medical): Not on file  . Lack of Transportation (Non-Medical): Not on file  Physical Activity:   . Days of Exercise per Week: Not on file  . Minutes of Exercise per Session: Not on file  Stress:   . Feeling of Stress : Not on file  Social Connections:   . Frequency of Communication with Friends and Family: Not on file  . Frequency of Social Gatherings with Friends and Family: Not on file  . Attends Religious Services: Not on file  . Active Member of Clubs or Organizations: Not on file  . Attends Archivist Meetings: Not on file  . Marital Status: Not on file  Intimate Partner Violence:   . Fear of Current or Ex-Partner: Not on file  . Emotionally Abused: Not on file  . Physically Abused: Not on file  . Sexually Abused: Not on file    Family History  Problem Relation Age of Onset  . Cancer Father        died in 81's everywhere  . Heart disease Mother   . Cancer Mother        lung  . Stroke Mother   . Asthma Daughter     BP 123/68   Pulse 89   Ht 5\' 6"  (1.676 m)   Wt 126 lb (57.2 kg)   BMI 20.34 kg/m   Body mass index is 20.34  kg/m.      Objective:   Physical Exam Vitals and nursing note reviewed. Exam conducted with a chaperone present.  Constitutional:      Appearance: She is well-developed.  HENT:     Head: Normocephalic and atraumatic.  Eyes:     Conjunctiva/sclera: Conjunctivae normal.     Pupils: Pupils are equal, round, and reactive to light.  Cardiovascular:     Rate and Rhythm: Normal rate  and regular rhythm.  Pulmonary:     Effort: Pulmonary effort is normal.  Abdominal:     Palpations: Abdomen is soft.  Musculoskeletal:     Cervical back: Normal range of motion and neck supple.       Feet:  Skin:    General: Skin is warm and dry.  Neurological:     Mental Status: She is alert and oriented to person, place, and time.     Cranial Nerves: No cranial nerve deficit.     Motor: No abnormal muscle tone.     Coordination: Coordination normal.     Deep Tendon Reflexes: Reflexes are normal and symmetric. Reflexes normal.  Psychiatric:        Behavior: Behavior normal.        Thought Content: Thought content normal.        Judgment: Judgment normal.    X-rays were done of the right foot, reported separately.       Assessment & Plan:   Encounter Diagnosis  Name Primary?  . Closed fracture of base of fifth metatarsal bone of right foot, initial encounter Yes   A new better fitting CAM walker given.  Return in two weeks.  X-rays on return.  Call if any problem.  Precautions discussed.   Electronically Signed Sanjuana Kava, MD 11/2/20212:38 PM

## 2020-07-31 DIAGNOSIS — Z95828 Presence of other vascular implants and grafts: Secondary | ICD-10-CM | POA: Diagnosis not present

## 2020-07-31 DIAGNOSIS — Z79899 Other long term (current) drug therapy: Secondary | ICD-10-CM | POA: Diagnosis not present

## 2020-07-31 DIAGNOSIS — C3491 Malignant neoplasm of unspecified part of right bronchus or lung: Secondary | ICD-10-CM | POA: Diagnosis not present

## 2020-07-31 DIAGNOSIS — E876 Hypokalemia: Secondary | ICD-10-CM | POA: Diagnosis not present

## 2020-07-31 DIAGNOSIS — C787 Secondary malignant neoplasm of liver and intrahepatic bile duct: Secondary | ICD-10-CM | POA: Diagnosis not present

## 2020-07-31 DIAGNOSIS — C799 Secondary malignant neoplasm of unspecified site: Secondary | ICD-10-CM | POA: Diagnosis not present

## 2020-07-31 DIAGNOSIS — F17210 Nicotine dependence, cigarettes, uncomplicated: Secondary | ICD-10-CM | POA: Diagnosis not present

## 2020-07-31 DIAGNOSIS — C3431 Malignant neoplasm of lower lobe, right bronchus or lung: Secondary | ICD-10-CM | POA: Diagnosis not present

## 2020-07-31 DIAGNOSIS — G893 Neoplasm related pain (acute) (chronic): Secondary | ICD-10-CM | POA: Diagnosis not present

## 2020-07-31 DIAGNOSIS — G4700 Insomnia, unspecified: Secondary | ICD-10-CM | POA: Diagnosis not present

## 2020-07-31 DIAGNOSIS — C7951 Secondary malignant neoplasm of bone: Secondary | ICD-10-CM | POA: Diagnosis not present

## 2020-07-31 DIAGNOSIS — E871 Hypo-osmolality and hyponatremia: Secondary | ICD-10-CM | POA: Diagnosis not present

## 2020-08-07 DIAGNOSIS — C787 Secondary malignant neoplasm of liver and intrahepatic bile duct: Secondary | ICD-10-CM | POA: Diagnosis not present

## 2020-08-07 DIAGNOSIS — C3431 Malignant neoplasm of lower lobe, right bronchus or lung: Secondary | ICD-10-CM | POA: Diagnosis not present

## 2020-08-07 DIAGNOSIS — C7951 Secondary malignant neoplasm of bone: Secondary | ICD-10-CM | POA: Diagnosis not present

## 2020-08-07 DIAGNOSIS — Z5111 Encounter for antineoplastic chemotherapy: Secondary | ICD-10-CM | POA: Diagnosis not present

## 2020-08-12 ENCOUNTER — Ambulatory Visit: Payer: Medicare Other | Admitting: Orthopaedic Surgery

## 2020-08-13 ENCOUNTER — Ambulatory Visit (INDEPENDENT_AMBULATORY_CARE_PROVIDER_SITE_OTHER): Payer: Medicare Other | Admitting: Orthopaedic Surgery

## 2020-08-13 ENCOUNTER — Ambulatory Visit: Payer: Medicare Other

## 2020-08-13 ENCOUNTER — Encounter: Payer: Self-pay | Admitting: Orthopaedic Surgery

## 2020-08-13 ENCOUNTER — Other Ambulatory Visit: Payer: Self-pay

## 2020-08-13 DIAGNOSIS — S92351D Displaced fracture of fifth metatarsal bone, right foot, subsequent encounter for fracture with routine healing: Secondary | ICD-10-CM

## 2020-08-13 DIAGNOSIS — R1312 Dysphagia, oropharyngeal phase: Secondary | ICD-10-CM | POA: Insufficient documentation

## 2020-08-13 NOTE — Progress Notes (Signed)
I am better  She has been using the CAM walker.  She has no pain.  She has no new trauma.  NV intact.  X-rays were done of the right foot, reported separately.  Encounter Diagnosis  Name Primary?  . Closed fracture of fifth metatarsal bone of right foot with routine healing, subsequent encounter Yes   Gradually come out of the CAM walker.  Return in three weeks.  X-rays then.  Call if any problem.  Precautions discussed.   Electronically Signed Sanjuana Kava, MD 11/17/20212:37 PM

## 2020-08-13 NOTE — Patient Instructions (Signed)
Steps to Quit Smoking Smoking tobacco is the leading cause of preventable death. It can affect almost every organ in the body. Smoking puts you and people around you at risk for many serious, long-lasting (chronic) diseases. Quitting smoking can be hard, but it is one of the best things that you can do for your health. It is never too late to quit. How do I get ready to quit? When you decide to quit smoking, make a plan to help you succeed. Before you quit:  Pick a date to quit. Set a date within the next 2 weeks to give you time to prepare.  Write down the reasons why you are quitting. Keep this list in places where you will see it often.  Tell your family, friends, and co-workers that you are quitting. Their support is important.  Talk with your doctor about the choices that may help you quit.  Find out if your health insurance will pay for these treatments.  Know the people, places, things, and activities that make you want to smoke (triggers). Avoid them. What first steps can I take to quit smoking?  Throw away all cigarettes at home, at work, and in your car.  Throw away the things that you use when you smoke, such as ashtrays and lighters.  Clean your car. Make sure to empty the ashtray.  Clean your home, including curtains and carpets. What can I do to help me quit smoking? Talk with your doctor about taking medicines and seeing a counselor at the same time. You are more likely to succeed when you do both.  If you are pregnant or breastfeeding, talk with your doctor about counseling or other ways to quit smoking. Do not take medicine to help you quit smoking unless your doctor tells you to do so. To quit smoking: Quit right away  Quit smoking totally, instead of slowly cutting back on how much you smoke over a period of time.  Go to counseling. You are more likely to quit if you go to counseling sessions regularly. Take medicine You may take medicines to help you quit. Some  medicines need a prescription, and some you can buy over-the-counter. Some medicines may contain a drug called nicotine to replace the nicotine in cigarettes. Medicines may:  Help you to stop having the desire to smoke (cravings).  Help to stop the problems that come when you stop smoking (withdrawal symptoms). Your doctor may ask you to use:  Nicotine patches, gum, or lozenges.  Nicotine inhalers or sprays.  Non-nicotine medicine that is taken by mouth. Find resources Find resources and other ways to help you quit smoking and remain smoke-free after you quit. These resources are most helpful when you use them often. They include:  Online chats with a counselor.  Phone quitlines.  Printed self-help materials.  Support groups or group counseling.  Text messaging programs.  Mobile phone apps. Use apps on your mobile phone or tablet that can help you stick to your quit plan. There are many free apps for mobile phones and tablets as well as websites. Examples include Quit Guide from the CDC and smokefree.gov  What things can I do to make it easier to quit?   Talk to your family and friends. Ask them to support and encourage you.  Call a phone quitline (1-800-QUIT-NOW), reach out to support groups, or work with a counselor.  Ask people who smoke to not smoke around you.  Avoid places that make you want to smoke,   such as: ? Bars. ? Parties. ? Smoke-break areas at work.  Spend time with people who do not smoke.  Lower the stress in your life. Stress can make you want to smoke. Try these things to help your stress: ? Getting regular exercise. ? Doing deep-breathing exercises. ? Doing yoga. ? Meditating. ? Doing a body scan. To do this, close your eyes, focus on one area of your body at a time from head to toe. Notice which parts of your body are tense. Try to relax the muscles in those areas. How will I feel when I quit smoking? Day 1 to 3 weeks Within the first 24 hours,  you may start to have some problems that come from quitting tobacco. These problems are very bad 2-3 days after you quit, but they do not often last for more than 2-3 weeks. You may get these symptoms:  Mood swings.  Feeling restless, nervous, angry, or annoyed.  Trouble concentrating.  Dizziness.  Strong desire for high-sugar foods and nicotine.  Weight gain.  Trouble pooping (constipation).  Feeling like you may vomit (nausea).  Coughing or a sore throat.  Changes in how the medicines that you take for other issues work in your body.  Depression.  Trouble sleeping (insomnia). Week 3 and afterward After the first 2-3 weeks of quitting, you may start to notice more positive results, such as:  Better sense of smell and taste.  Less coughing and sore throat.  Slower heart rate.  Lower blood pressure.  Clearer skin.  Better breathing.  Fewer sick days. Quitting smoking can be hard. Do not give up if you fail the first time. Some people need to try a few times before they succeed. Do your best to stick to your quit plan, and talk with your doctor if you have any questions or concerns. Summary  Smoking tobacco is the leading cause of preventable death. Quitting smoking can be hard, but it is one of the best things that you can do for your health.  When you decide to quit smoking, make a plan to help you succeed.  Quit smoking right away, not slowly over a period of time.  When you start quitting, seek help from your doctor, family, or friends. This information is not intended to replace advice given to you by your health care provider. Make sure you discuss any questions you have with your health care provider. Document Revised: 06/08/2019 Document Reviewed: 12/02/2018 Elsevier Patient Education  2020 Elsevier Inc.  

## 2020-08-28 DIAGNOSIS — E871 Hypo-osmolality and hyponatremia: Secondary | ICD-10-CM | POA: Diagnosis not present

## 2020-08-28 DIAGNOSIS — G4700 Insomnia, unspecified: Secondary | ICD-10-CM | POA: Diagnosis not present

## 2020-08-28 DIAGNOSIS — Z7952 Long term (current) use of systemic steroids: Secondary | ICD-10-CM | POA: Diagnosis not present

## 2020-08-28 DIAGNOSIS — C7951 Secondary malignant neoplasm of bone: Secondary | ICD-10-CM | POA: Diagnosis not present

## 2020-08-28 DIAGNOSIS — G893 Neoplasm related pain (acute) (chronic): Secondary | ICD-10-CM | POA: Diagnosis not present

## 2020-08-28 DIAGNOSIS — R5383 Other fatigue: Secondary | ICD-10-CM | POA: Diagnosis not present

## 2020-08-28 DIAGNOSIS — Z791 Long term (current) use of non-steroidal anti-inflammatories (NSAID): Secondary | ICD-10-CM | POA: Diagnosis not present

## 2020-08-28 DIAGNOSIS — F17210 Nicotine dependence, cigarettes, uncomplicated: Secondary | ICD-10-CM | POA: Diagnosis not present

## 2020-08-28 DIAGNOSIS — Z79899 Other long term (current) drug therapy: Secondary | ICD-10-CM | POA: Diagnosis not present

## 2020-08-28 DIAGNOSIS — C787 Secondary malignant neoplasm of liver and intrahepatic bile duct: Secondary | ICD-10-CM | POA: Diagnosis not present

## 2020-08-28 DIAGNOSIS — C799 Secondary malignant neoplasm of unspecified site: Secondary | ICD-10-CM | POA: Diagnosis not present

## 2020-08-28 DIAGNOSIS — C3431 Malignant neoplasm of lower lobe, right bronchus or lung: Secondary | ICD-10-CM | POA: Diagnosis not present

## 2020-08-28 DIAGNOSIS — Z801 Family history of malignant neoplasm of trachea, bronchus and lung: Secondary | ICD-10-CM | POA: Diagnosis not present

## 2020-08-28 DIAGNOSIS — C3491 Malignant neoplasm of unspecified part of right bronchus or lung: Secondary | ICD-10-CM | POA: Diagnosis not present

## 2020-09-02 ENCOUNTER — Ambulatory Visit: Payer: Medicare Other

## 2020-09-02 ENCOUNTER — Ambulatory Visit (INDEPENDENT_AMBULATORY_CARE_PROVIDER_SITE_OTHER): Payer: Medicare Other | Admitting: Orthopaedic Surgery

## 2020-09-02 ENCOUNTER — Encounter: Payer: Self-pay | Admitting: Orthopaedic Surgery

## 2020-09-02 ENCOUNTER — Other Ambulatory Visit: Payer: Self-pay

## 2020-09-02 DIAGNOSIS — S92351D Displaced fracture of fifth metatarsal bone, right foot, subsequent encounter for fracture with routine healing: Secondary | ICD-10-CM

## 2020-09-02 NOTE — Progress Notes (Signed)
I am better  She has been in the CAM walker and having no pain.  I have told her and her daughter who called in during the visit that the patient can come out of the boot in the house and use the boot in public and slowly stop the boot.  NV intact.  X-rays were done of the right foot, reported separately.  Encounter Diagnosis  Name Primary?  . Closed fracture of fifth metatarsal bone of right foot with routine healing, subsequent encounter Yes   Return in one month.  X-rays on return.  Call if any problem.  Precautions discussed.   Electronically Signed Sanjuana Kava, MD 12/7/20219:53 AM

## 2020-09-02 NOTE — Patient Instructions (Signed)
Steps to Quit Smoking Smoking tobacco is the leading cause of preventable death. It can affect almost every organ in the body. Smoking puts you and people around you at risk for many serious, long-lasting (chronic) diseases. Quitting smoking can be hard, but it is one of the best things that you can do for your health. It is never too late to quit. How do I get ready to quit? When you decide to quit smoking, make a plan to help you succeed. Before you quit:  Pick a date to quit. Set a date within the next 2 weeks to give you time to prepare.  Write down the reasons why you are quitting. Keep this list in places where you will see it often.  Tell your family, friends, and co-workers that you are quitting. Their support is important.  Talk with your doctor about the choices that may help you quit.  Find out if your health insurance will pay for these treatments.  Know the people, places, things, and activities that make you want to smoke (triggers). Avoid them. What first steps can I take to quit smoking?  Throw away all cigarettes at home, at work, and in your car.  Throw away the things that you use when you smoke, such as ashtrays and lighters.  Clean your car. Make sure to empty the ashtray.  Clean your home, including curtains and carpets. What can I do to help me quit smoking? Talk with your doctor about taking medicines and seeing a counselor at the same time. You are more likely to succeed when you do both.  If you are pregnant or breastfeeding, talk with your doctor about counseling or other ways to quit smoking. Do not take medicine to help you quit smoking unless your doctor tells you to do so. To quit smoking: Quit right away  Quit smoking totally, instead of slowly cutting back on how much you smoke over a period of time.  Go to counseling. You are more likely to quit if you go to counseling sessions regularly. Take medicine You may take medicines to help you quit. Some  medicines need a prescription, and some you can buy over-the-counter. Some medicines may contain a drug called nicotine to replace the nicotine in cigarettes. Medicines may:  Help you to stop having the desire to smoke (cravings).  Help to stop the problems that come when you stop smoking (withdrawal symptoms). Your doctor may ask you to use:  Nicotine patches, gum, or lozenges.  Nicotine inhalers or sprays.  Non-nicotine medicine that is taken by mouth. Find resources Find resources and other ways to help you quit smoking and remain smoke-free after you quit. These resources are most helpful when you use them often. They include:  Online chats with a counselor.  Phone quitlines.  Printed self-help materials.  Support groups or group counseling.  Text messaging programs.  Mobile phone apps. Use apps on your mobile phone or tablet that can help you stick to your quit plan. There are many free apps for mobile phones and tablets as well as websites. Examples include Quit Guide from the CDC and smokefree.gov  What things can I do to make it easier to quit?   Talk to your family and friends. Ask them to support and encourage you.  Call a phone quitline (1-800-QUIT-NOW), reach out to support groups, or work with a counselor.  Ask people who smoke to not smoke around you.  Avoid places that make you want to smoke,   such as: ? Bars. ? Parties. ? Smoke-break areas at work.  Spend time with people who do not smoke.  Lower the stress in your life. Stress can make you want to smoke. Try these things to help your stress: ? Getting regular exercise. ? Doing deep-breathing exercises. ? Doing yoga. ? Meditating. ? Doing a body scan. To do this, close your eyes, focus on one area of your body at a time from head to toe. Notice which parts of your body are tense. Try to relax the muscles in those areas. How will I feel when I quit smoking? Day 1 to 3 weeks Within the first 24 hours,  you may start to have some problems that come from quitting tobacco. These problems are very bad 2-3 days after you quit, but they do not often last for more than 2-3 weeks. You may get these symptoms:  Mood swings.  Feeling restless, nervous, angry, or annoyed.  Trouble concentrating.  Dizziness.  Strong desire for high-sugar foods and nicotine.  Weight gain.  Trouble pooping (constipation).  Feeling like you may vomit (nausea).  Coughing or a sore throat.  Changes in how the medicines that you take for other issues work in your body.  Depression.  Trouble sleeping (insomnia). Week 3 and afterward After the first 2-3 weeks of quitting, you may start to notice more positive results, such as:  Better sense of smell and taste.  Less coughing and sore throat.  Slower heart rate.  Lower blood pressure.  Clearer skin.  Better breathing.  Fewer sick days. Quitting smoking can be hard. Do not give up if you fail the first time. Some people need to try a few times before they succeed. Do your best to stick to your quit plan, and talk with your doctor if you have any questions or concerns. Summary  Smoking tobacco is the leading cause of preventable death. Quitting smoking can be hard, but it is one of the best things that you can do for your health.  When you decide to quit smoking, make a plan to help you succeed.  Quit smoking right away, not slowly over a period of time.  When you start quitting, seek help from your doctor, family, or friends. This information is not intended to replace advice given to you by your health care provider. Make sure you discuss any questions you have with your health care provider. Document Revised: 06/08/2019 Document Reviewed: 12/02/2018 Elsevier Patient Education  2020 Elsevier Inc.  

## 2020-09-04 ENCOUNTER — Ambulatory Visit: Payer: Medicare Other | Admitting: Orthopaedic Surgery

## 2020-09-04 DIAGNOSIS — C787 Secondary malignant neoplasm of liver and intrahepatic bile duct: Secondary | ICD-10-CM | POA: Diagnosis not present

## 2020-09-04 DIAGNOSIS — C7951 Secondary malignant neoplasm of bone: Secondary | ICD-10-CM | POA: Diagnosis not present

## 2020-09-04 DIAGNOSIS — Z5111 Encounter for antineoplastic chemotherapy: Secondary | ICD-10-CM | POA: Diagnosis not present

## 2020-09-04 DIAGNOSIS — C3431 Malignant neoplasm of lower lobe, right bronchus or lung: Secondary | ICD-10-CM | POA: Diagnosis not present

## 2020-09-24 DIAGNOSIS — C7951 Secondary malignant neoplasm of bone: Secondary | ICD-10-CM | POA: Diagnosis not present

## 2020-09-24 DIAGNOSIS — R110 Nausea: Secondary | ICD-10-CM | POA: Diagnosis not present

## 2020-09-24 DIAGNOSIS — C787 Secondary malignant neoplasm of liver and intrahepatic bile duct: Secondary | ICD-10-CM | POA: Diagnosis not present

## 2020-09-24 DIAGNOSIS — I4892 Unspecified atrial flutter: Secondary | ICD-10-CM | POA: Diagnosis not present

## 2020-09-24 DIAGNOSIS — C799 Secondary malignant neoplasm of unspecified site: Secondary | ICD-10-CM | POA: Diagnosis not present

## 2020-09-24 DIAGNOSIS — I10 Essential (primary) hypertension: Secondary | ICD-10-CM | POA: Diagnosis not present

## 2020-09-24 DIAGNOSIS — Z79899 Other long term (current) drug therapy: Secondary | ICD-10-CM | POA: Diagnosis not present

## 2020-09-24 DIAGNOSIS — C3491 Malignant neoplasm of unspecified part of right bronchus or lung: Secondary | ICD-10-CM | POA: Diagnosis not present

## 2020-09-24 DIAGNOSIS — G893 Neoplasm related pain (acute) (chronic): Secondary | ICD-10-CM | POA: Diagnosis not present

## 2020-09-24 DIAGNOSIS — I4589 Other specified conduction disorders: Secondary | ICD-10-CM | POA: Diagnosis not present

## 2020-09-24 DIAGNOSIS — E871 Hypo-osmolality and hyponatremia: Secondary | ICD-10-CM | POA: Diagnosis not present

## 2020-09-24 DIAGNOSIS — F17200 Nicotine dependence, unspecified, uncomplicated: Secondary | ICD-10-CM | POA: Diagnosis not present

## 2020-09-24 DIAGNOSIS — I4891 Unspecified atrial fibrillation: Secondary | ICD-10-CM | POA: Diagnosis not present

## 2020-09-24 DIAGNOSIS — Z9221 Personal history of antineoplastic chemotherapy: Secondary | ICD-10-CM | POA: Diagnosis not present

## 2020-09-24 DIAGNOSIS — I340 Nonrheumatic mitral (valve) insufficiency: Secondary | ICD-10-CM | POA: Diagnosis not present

## 2020-09-24 DIAGNOSIS — D6859 Other primary thrombophilia: Secondary | ICD-10-CM | POA: Diagnosis not present

## 2020-09-24 DIAGNOSIS — G4700 Insomnia, unspecified: Secondary | ICD-10-CM | POA: Diagnosis not present

## 2020-09-24 DIAGNOSIS — R197 Diarrhea, unspecified: Secondary | ICD-10-CM | POA: Diagnosis not present

## 2020-09-24 DIAGNOSIS — I679 Cerebrovascular disease, unspecified: Secondary | ICD-10-CM | POA: Diagnosis not present

## 2020-09-24 DIAGNOSIS — I480 Paroxysmal atrial fibrillation: Secondary | ICD-10-CM | POA: Diagnosis not present

## 2020-09-24 DIAGNOSIS — Z801 Family history of malignant neoplasm of trachea, bronchus and lung: Secondary | ICD-10-CM | POA: Diagnosis not present

## 2020-09-24 DIAGNOSIS — I348 Other nonrheumatic mitral valve disorders: Secondary | ICD-10-CM | POA: Diagnosis not present

## 2020-09-24 DIAGNOSIS — C3431 Malignant neoplasm of lower lobe, right bronchus or lung: Secondary | ICD-10-CM | POA: Diagnosis not present

## 2020-09-25 DIAGNOSIS — C7951 Secondary malignant neoplasm of bone: Secondary | ICD-10-CM | POA: Diagnosis not present

## 2020-09-25 DIAGNOSIS — C799 Secondary malignant neoplasm of unspecified site: Secondary | ICD-10-CM | POA: Diagnosis not present

## 2020-09-25 DIAGNOSIS — C3491 Malignant neoplasm of unspecified part of right bronchus or lung: Secondary | ICD-10-CM | POA: Diagnosis not present

## 2020-09-25 DIAGNOSIS — I4891 Unspecified atrial fibrillation: Secondary | ICD-10-CM | POA: Diagnosis not present

## 2020-09-30 ENCOUNTER — Ambulatory Visit (INDEPENDENT_AMBULATORY_CARE_PROVIDER_SITE_OTHER): Payer: Medicare Other | Admitting: Orthopaedic Surgery

## 2020-09-30 ENCOUNTER — Other Ambulatory Visit: Payer: Self-pay

## 2020-09-30 ENCOUNTER — Ambulatory Visit: Payer: Medicare Other

## 2020-09-30 ENCOUNTER — Encounter: Payer: Self-pay | Admitting: Orthopaedic Surgery

## 2020-09-30 VITALS — Ht 66.0 in | Wt 126.0 lb

## 2020-09-30 DIAGNOSIS — S92351D Displaced fracture of fifth metatarsal bone, right foot, subsequent encounter for fracture with routine healing: Secondary | ICD-10-CM

## 2020-09-30 NOTE — Progress Notes (Signed)
My foot does not hurt  She is doing well with the right foot and has only slight tenderness at times.  She has no new trauma.  She is wearing regular shoes.  X-rays were done of the right foot, three views, reported separately.  Encounter Diagnosis  Name Primary?  . Closed fracture of fifth metatarsal bone of right foot with routine healing, subsequent encounter Yes   Discharge.  Call if any problem.  Precautions discussed.   Electronically Signed Sanjuana Kava, MD 1/4/20224:07 PM

## 2020-10-02 DIAGNOSIS — Z95828 Presence of other vascular implants and grafts: Secondary | ICD-10-CM | POA: Diagnosis not present

## 2020-10-02 DIAGNOSIS — C7951 Secondary malignant neoplasm of bone: Secondary | ICD-10-CM | POA: Diagnosis not present

## 2020-10-02 DIAGNOSIS — C787 Secondary malignant neoplasm of liver and intrahepatic bile duct: Secondary | ICD-10-CM | POA: Diagnosis not present

## 2020-10-02 DIAGNOSIS — C3431 Malignant neoplasm of lower lobe, right bronchus or lung: Secondary | ICD-10-CM | POA: Diagnosis not present

## 2020-10-02 DIAGNOSIS — Z7689 Persons encountering health services in other specified circumstances: Secondary | ICD-10-CM | POA: Diagnosis not present

## 2020-10-02 DIAGNOSIS — Z5111 Encounter for antineoplastic chemotherapy: Secondary | ICD-10-CM | POA: Diagnosis not present

## 2020-10-16 DIAGNOSIS — C799 Secondary malignant neoplasm of unspecified site: Secondary | ICD-10-CM | POA: Diagnosis not present

## 2020-10-16 DIAGNOSIS — C3431 Malignant neoplasm of lower lobe, right bronchus or lung: Secondary | ICD-10-CM | POA: Diagnosis not present

## 2020-10-16 DIAGNOSIS — G893 Neoplasm related pain (acute) (chronic): Secondary | ICD-10-CM | POA: Diagnosis not present

## 2020-10-16 DIAGNOSIS — Z95828 Presence of other vascular implants and grafts: Secondary | ICD-10-CM | POA: Diagnosis not present

## 2020-10-16 DIAGNOSIS — Z79899 Other long term (current) drug therapy: Secondary | ICD-10-CM | POA: Diagnosis not present

## 2020-10-16 DIAGNOSIS — C3491 Malignant neoplasm of unspecified part of right bronchus or lung: Secondary | ICD-10-CM | POA: Diagnosis not present

## 2020-10-16 DIAGNOSIS — C787 Secondary malignant neoplasm of liver and intrahepatic bile duct: Secondary | ICD-10-CM | POA: Diagnosis not present

## 2020-10-16 DIAGNOSIS — E871 Hypo-osmolality and hyponatremia: Secondary | ICD-10-CM | POA: Diagnosis not present

## 2020-10-16 DIAGNOSIS — F17210 Nicotine dependence, cigarettes, uncomplicated: Secondary | ICD-10-CM | POA: Diagnosis not present

## 2020-10-16 DIAGNOSIS — I4891 Unspecified atrial fibrillation: Secondary | ICD-10-CM | POA: Diagnosis not present

## 2020-10-16 DIAGNOSIS — G4700 Insomnia, unspecified: Secondary | ICD-10-CM | POA: Diagnosis not present

## 2020-10-16 DIAGNOSIS — C7951 Secondary malignant neoplasm of bone: Secondary | ICD-10-CM | POA: Diagnosis not present

## 2020-10-23 DIAGNOSIS — C7951 Secondary malignant neoplasm of bone: Secondary | ICD-10-CM | POA: Diagnosis not present

## 2020-10-23 DIAGNOSIS — C787 Secondary malignant neoplasm of liver and intrahepatic bile duct: Secondary | ICD-10-CM | POA: Diagnosis not present

## 2020-10-23 DIAGNOSIS — Z79899 Other long term (current) drug therapy: Secondary | ICD-10-CM | POA: Diagnosis not present

## 2020-10-23 DIAGNOSIS — C3491 Malignant neoplasm of unspecified part of right bronchus or lung: Secondary | ICD-10-CM | POA: Diagnosis not present

## 2020-10-23 DIAGNOSIS — F17210 Nicotine dependence, cigarettes, uncomplicated: Secondary | ICD-10-CM | POA: Diagnosis not present

## 2020-11-07 ENCOUNTER — Other Ambulatory Visit: Payer: Self-pay

## 2020-11-07 ENCOUNTER — Ambulatory Visit (INDEPENDENT_AMBULATORY_CARE_PROVIDER_SITE_OTHER): Payer: Medicare Other

## 2020-11-07 ENCOUNTER — Ambulatory Visit: Payer: Medicare Other

## 2020-11-07 ENCOUNTER — Ambulatory Visit
Admission: EM | Admit: 2020-11-07 | Discharge: 2020-11-07 | Disposition: A | Discharge status: Home / Self Care | Payer: Medicare Other | Attending: Emergency Medicine | Admitting: Emergency Medicine

## 2020-11-07 DIAGNOSIS — R059 Cough, unspecified: Secondary | ICD-10-CM | POA: Diagnosis not present

## 2020-11-07 DIAGNOSIS — U071 COVID-19: Secondary | ICD-10-CM

## 2020-11-07 DIAGNOSIS — J90 Pleural effusion, not elsewhere classified: Secondary | ICD-10-CM | POA: Diagnosis not present

## 2020-11-07 DIAGNOSIS — J1282 Pneumonia due to coronavirus disease 2019: Secondary | ICD-10-CM | POA: Diagnosis not present

## 2020-11-07 MED ORDER — BENZONATATE 100 MG PO CAPS: 100.0000 mg | ORAL_CAPSULE | Freq: Three times a day (TID) | ORAL | 0 refills | Status: DC | PRN

## 2020-11-07 MED ORDER — DEXAMETHASONE 4 MG PO TABS: 4.0000 mg | ORAL_TABLET | Freq: Every day | ORAL | 0 refills | Status: AC

## 2020-11-07 MED ORDER — ALBUTEROL SULFATE HFA 108 (90 BASE) MCG/ACT IN AERS: 1.0000 | INHALATION_SPRAY | Freq: Four times a day (QID) | RESPIRATORY_TRACT | 0 refills | Status: DC | PRN

## 2020-11-07 NOTE — ED Provider Notes (Signed)
[place]   [id number] 11/07/20 Arrival Time: 1937   Chief Complaint  Patient presents with  . chest congestion      SUBJECTIVE: History from: patient.  Cynthia Kelley is a 65 y.o. female with history of lung cancer presented to the urgent care with a complaint of cough and nasal congestion for the past 3 weeks.  Denies sick exposure to COVID, flu or strep. Denies recent travel.  Has tried OTC medication without relief.  Denies alleviating or aggravating factors.  Report previous previous symptoms in the past and was diagnosed with lung cancer.   Denies fever, chills, fatigue, sinus pain, rhinorrhea, sore throat, SOB, wheezing, chest pain, nausea, changes in bowel or bladder habits.    ROS: As per HPI.  All other pertinent ROS negative.      Past Medical History:  Diagnosis Date  . Anemia 11/22/2017  . Arthritis   . Lung cancer, primary, with metastasis from lung to other site [facility]) 01/16/2018  . Nuclear sclerotic cataract of both eyes 06/08/2018  . Osteopenia 05/15/2013  . Osteoporosis    osteopenia  . Tobacco abuse 12/14/2016   Past Surgical History:  Procedure Laterality Date  . FRACTURE SURGERY     torn ACL  . KNEE ARTHROSCOPY     No Known Allergies No current facility-administered medications on file prior to encounter.   Current Outpatient Medications on File Prior to Encounter  Medication Sig Dispense Refill  . amLODipine (NORVASC) 2.5 MG tablet Take 2.5 mg by mouth daily.    Marland Kitchen apixaban (ELIQUIS) 5 MG TABS tablet Take by mouth.    . calcium carbonate (OS-CAL - DOSED IN MG OF ELEMENTAL CALCIUM) 1250 (500 Ca) MG tablet Take 1 tablet by mouth 3 (three) times daily with meals.    Marland Kitchen LORazepam (ATIVAN) 1 MG tablet Take 1 mg by mouth every 8 (eight) hours.    . metoprolol succinate (TOPROL-XL) 100 MG 24 hr tablet Take by mouth.    . Multiple Vitamins-Minerals (MULTIVITAMIN WITH MINERALS) tablet Take 1 tablet by mouth daily.    . potassium chloride SA  (K-DUR,KLOR-CON) 20 MEQ tablet Take 20 mEq by mouth 2 (two) times daily.     Social History   Socioeconomic History  . Marital status: Married    Spouse name: [name]  . Number of children: 1  . Years of education: 68  . Highest education level: Not on file  Occupational History  . Occupation: Air traffic controller    Comment: retired  Tobacco Use  . Smoking status: Current Every Day Smoker    Packs/day: 0.50    Years: 20.00    Pack years: 10.00    Types: Cigarettes    Start date: 09/28/1963  . Smokeless tobacco: Never Used  Vaping Use  . Vaping Use: Never used  Substance and Sexual Activity  . Alcohol use: Yes    Alcohol/week: 20.0 standard drinks    Types: 20 Cans of beer per week    Comment: 3-4 beers a day  . Drug use: No  . Sexual activity: Yes    Birth control/protection: Post-menopausal  Other Topics Concern  . Not on file  Social History Narrative   Retired Air traffic controller   Lives with husband [name]   Daughter Marcelino Scot is in [place] to walk   Social Determinants of Health   Financial Resource Strain: Not on [employer] Insecurity: Not on file  Transportation Needs: Not on file  Physical Activity: Not on file  Stress: Not on file  Social Connections: Not on file  Intimate Partner Violence: Not on file   Family History  Problem Relation Age of Onset  . Cancer Father        died in 56's everywhere  . Heart disease Mother   . Cancer Mother        lung  . Stroke Mother   . Asthma Daughter     OBJECTIVE:  Vitals:   11/07/20 1236  BP: (!) 105/101  Pulse: (!) 106  Resp: 20  Temp: (!) 97.5 F (36.4 C)  SpO2: 95%     General appearance: alert; appears fatigued, but nontoxic; speaking in full sentences and tolerating own secretions HEENT: NCAT; Ears: EACs clear, TMs pearly gray; Eyes: PERRL.  EOM grossly intact. Sinuses: nontender; Nose: nares patent without rhinorrhea, Throat: oropharynx clear, tonsils non erythematous or enlarged, uvula midline  Neck: supple  without LAD Lungs: unlabored respirations, symmetrical air entry; cough: moderate; no respiratory distress; CTAB Heart: regular rate and rhythm.  Radial pulses 2+ symmetrical bilaterally Skin: warm and dry Psychological: alert and cooperative; normal mood and affect  LABS:  No results found for this or any previous visit (from the past 24 hour(s)).   RADIOLOGY:  DG Chest 2 View  Result Date: 11/07/2020 CLINICAL DATA:  Cough for 3 weeks. EXAM: CHEST - 2 VIEW COMPARISON:  None. FINDINGS: The patient has a small right pleural effusion. There is patchy airspace disease in the right upper, right middle and right lower lobes. Milder degree of patchy airspace opacity is seen in the lingula. No left pleural effusion. No pneumothorax. Heart size is normal. Aortic atherosclerosis. No acute bony abnormality. Scoliosis noted. IMPRESSION: Multifocal pneumonia is worse on the right. Associated small right pleural effusion noted. Aortic Atherosclerosis (ICD10-I70.0). Electronically Signed   By: Inge Rise M.D.   On: 11/07/2020 13:03     Chest X-ray is positive for multifocal pneumonia, worse in the right than left lung.  Small right pleural effusion was noted.  I have reviewed the x-ray myself and the radiologist interpretation.  I am in agreement with the radiologist interpretation.   ASSESSMENT & PLAN:  1. Pneumonia due to COVID-19 virus   2. Cough   3. Pleural effusion     Meds ordered this encounter  Medications  . dexamethasone (DECADRON) 4 MG tablet    Sig: Take 1 tablet (4 mg total) by mouth daily for 7 days.    Dispense:  7 tablet    Refill:  0  . albuterol (VENTOLIN HFA) 108 (90 Base) MCG/ACT inhaler    Sig: Inhale 1-2 puffs into the lungs every 6 (six) hours as needed.    Dispense:  18 g    Refill:  0  . benzonatate (TESSALON) 100 MG capsule    Sig: Take 1 capsule (100 mg total) by mouth 3 (three) times daily as needed for cough.    Dispense:  30 capsule    Refill:  0    Patient is stable at discharge.  Multifocal pneumonia is more likely related to COVID-19 infection.  Patient was never tested therefore was unaware of Covid infections.  Will prescribe Tessalon Perles for cough, albuterol and Decadron.   Discharge instructions  Get plenty of rest and push fluids Tessalon Perles prescribed for cough Decadron was prescribed  Albuterol was prescribed use medications daily for symptom relief Use OTC medications like ibuprofen or tylenol as needed fever or pain Call or go to the ED if you  have any new or worsening symptoms such as fever, worsening cough, shortness of breath, chest tightness, chest pain, turning blue, changes in mental status, etc...   Reviewed expectations re: course of current medical issues. Questions answered. Outlined signs and symptoms indicating need for more acute intervention. Patient verbalized understanding. After Visit Summary given.         Emerson Monte, FNP 11/07/20 1326

## 2020-11-07 NOTE — Discharge Instructions (Addendum)
Get plenty of rest and push fluids Tessalon Perles prescribed for cough Decadron was prescribed  Albuterol was prescribed use medications daily for symptom relief Use OTC medications like ibuprofen or tylenol as needed fever or pain Call or go to the ED if you have any new or worsening symptoms such as fever, worsening cough, shortness of breath, chest tightness, chest pain, turning blue, changes in mental status, etc..Marland Kitchen

## 2020-11-07 NOTE — ED Triage Notes (Signed)
Pt presents with c/o cough and chest congestion for past 3 weeks , had negative covid test

## 2020-11-09 ENCOUNTER — Ambulatory Visit
Admission: EM | Admit: 2020-11-09 | Discharge: 2020-11-09 | Disposition: A | Discharge status: Home / Self Care | Payer: Medicare Other | Attending: Emergency Medicine | Admitting: Emergency Medicine

## 2020-11-09 ENCOUNTER — Encounter: Payer: Self-pay | Admitting: Emergency Medicine

## 2020-11-09 DIAGNOSIS — R600 Localized edema: Secondary | ICD-10-CM

## 2020-11-09 MED ORDER — FUROSEMIDE 20 MG PO TABS: 20.0000 mg | ORAL_TABLET | Freq: Every day | ORAL | 0 refills | Status: DC

## 2020-11-09 NOTE — Discharge Instructions (Addendum)
Elevate both extremities while sitting or sleeping Elevate the extremity by using pillow May use OTC pressure stocking socks.  Sold at [facility], [facility] or [facility] Lasix was prescribed Follow-up and establish care with PCP Return or go to ED if you develop any new or worsening of your sympto

## 2020-11-09 NOTE — ED Provider Notes (Signed)
[place]   [id number] 11/09/20 Arrival Time: 5631   Chief Complaint  Patient presents with  . Leg Swelling    SUBJECTIVE: History from: patient.  Cynthia Kelley is a 65 y.o. female Who presented to the urgent care with a complaint of bilateral lower extremity swelling for the past 3 days.  Denies any precipitating event, injury or trauma.  Localized swelling to bilateral leg, ankle and foot.  Has not tried any OTC medication.  Denies any alleviating or worsening symptoms.  Denies similar symptoms in the past.  Denies chills, fever, nausea, vomiting, diarrhea.  ROS: As per HPI.  All other pertinent ROS negative.      Past Medical History:  Diagnosis Date  . Anemia 11/22/2017  . Arthritis   . Lung cancer, primary, with metastasis from lung to other site [facility]) 01/16/2018  . Nuclear sclerotic cataract of both eyes 06/08/2018  . Osteopenia 05/15/2013  . Osteoporosis    osteopenia  . Tobacco abuse 12/14/2016   Past Surgical History:  Procedure Laterality Date  . FRACTURE SURGERY     torn ACL  . KNEE ARTHROSCOPY     No Known Allergies No current facility-administered medications on file prior to encounter.   Current Outpatient Medications on File Prior to Encounter  Medication Sig Dispense Refill  . albuterol (VENTOLIN HFA) 108 (90 Base) MCG/ACT inhaler Inhale 1-2 puffs into the lungs every 6 (six) hours as needed. 18 g 0  . amLODipine (NORVASC) 2.5 MG tablet Take 2.5 mg by mouth daily.    Marland Kitchen apixaban (ELIQUIS) 5 MG TABS tablet Take by mouth.    . benzonatate (TESSALON) 100 MG capsule Take 1 capsule (100 mg total) by mouth 3 (three) times daily as needed for cough. 30 capsule 0  . calcium carbonate (OS-CAL - DOSED IN MG OF ELEMENTAL CALCIUM) 1250 (500 Ca) MG tablet Take 1 tablet by mouth 3 (three) times daily with meals.    Marland Kitchen dexamethasone (DECADRON) 4 MG tablet Take 1 tablet (4 mg total) by mouth daily for 7 days. 7 tablet 0  . LORazepam (ATIVAN) 1 MG tablet Take 1 mg by  mouth every 8 (eight) hours.    . metoprolol succinate (TOPROL-XL) 100 MG 24 hr tablet Take by mouth.    . Multiple Vitamins-Minerals (MULTIVITAMIN WITH MINERALS) tablet Take 1 tablet by mouth daily.    . potassium chloride SA (K-DUR,KLOR-CON) 20 MEQ tablet Take 20 mEq by mouth 2 (two) times daily.     Social History   Socioeconomic History  . Marital status: Married    Spouse name: [name]  . Number of children: 1  . Years of education: 30  . Highest education level: Not on file  Occupational History  . Occupation: Air traffic controller    Comment: retired  Tobacco Use  . Smoking status: Current Every Day Smoker    Packs/day: 0.50    Years: 20.00    Pack years: 10.00    Types: Cigarettes    Start date: 09/28/1963  . Smokeless tobacco: Never Used  Vaping Use  . Vaping Use: Never used  Substance and Sexual Activity  . Alcohol use: Yes    Alcohol/week: 20.0 standard drinks    Types: 20 Cans of beer per week    Comment: 3-4 beers a day  . Drug use: No  . Sexual activity: Yes    Birth control/protection: Post-menopausal  Other Topics Concern  . Not on file  Social History Narrative   Retired Air traffic controller  Lives with husband [name]   Daughter Marcelino Scot is in [place]   Likes to walk   Social Determinants of Radio broadcast assistant Strain: Not on [employer] Insecurity: Not on file  Transportation Needs: Not on file  Physical Activity: Not on file  Stress: Not on file  Social Connections: Not on file  Intimate Partner Violence: Not on file   Family History  Problem Relation Age of Onset  . Cancer Father        died in 55's everywhere  . Heart disease Mother   . Cancer Mother        lung  . Stroke Mother   . Asthma Daughter     OBJECTIVE:  Vitals:   11/09/20 1200 11/09/20 1209  BP: 128/83   Pulse: (!) 123   Resp: 16   Temp: 97.8 F (36.6 C)   TempSrc: Oral   SpO2: (!) 89% 93%     Physical Exam Vitals and nursing note reviewed.  Constitutional:      General: She is  not in acute distress.    Appearance: Normal appearance. She is normal weight. She is not ill-appearing, toxic-appearing or diaphoretic.  HENT:     Head: Normocephalic.  Cardiovascular:     Rate and Rhythm: Normal rate and regular rhythm.     Pulses: Normal pulses.     Heart sounds: Normal heart sounds. No murmur heard. No friction rub. No gallop.   Pulmonary:     Effort: Pulmonary effort is normal. No respiratory distress.     Breath sounds: Normal breath sounds. No stridor. No wheezing, rhonchi or rales.  Chest:     Chest wall: No tenderness.  Musculoskeletal:     Right lower leg: No deformity, lacerations, tenderness or bony tenderness. 2+ Pitting Edema present.     Left lower leg: No deformity, lacerations, tenderness or bony tenderness. 2+ Pitting Edema present.  Neurological:     Mental Status: She is alert and oriented to person, place, and time.     LABS:  No results found for this or any previous visit (from the past 24 hour(s)).   ASSESSMENT & PLAN:  1. Edema of both lower extremities     Meds ordered this encounter  Medications  . furosemide (LASIX) 20 MG tablet    Sig: Take 1 tablet (20 mg total) by mouth daily for 5 days.    Dispense:  5 tablet    Refill:  0    Discharge instructions  Elevate both extremities while sitting or sleeping Elevate the extremity by using pillow May use OTC pressure stocking socks.  [facility] or [facility] Lasix was prescribed Follow-up and establish care with PCP Return or go to ED if you develop any new or worsening of your symptoms  Reviewed expectations re: course of current medical issues. Questions answered. Outlined signs and symptoms indicating need for more acute intervention. Patient verbalized understanding. After Visit Summary given.         Emerson Monte, [place] 11/09/20 1239

## 2020-11-09 NOTE — ED Triage Notes (Addendum)
Swelling to bilateral lower extremities that started on Friday that has continued to get worse .  Pt seen here on Friday but states she forgot to mention this to provider.  Denies any shortness of breath.

## 2020-11-11 DIAGNOSIS — I5032 Chronic diastolic (congestive) heart failure: Secondary | ICD-10-CM | POA: Insufficient documentation

## 2020-11-11 DIAGNOSIS — I483 Typical atrial flutter: Secondary | ICD-10-CM | POA: Insufficient documentation

## 2020-11-11 DIAGNOSIS — I5030 Unspecified diastolic (congestive) heart failure: Secondary | ICD-10-CM | POA: Insufficient documentation

## 2020-11-11 DIAGNOSIS — I4892 Unspecified atrial flutter: Secondary | ICD-10-CM | POA: Diagnosis not present

## 2020-11-11 DIAGNOSIS — I5031 Acute diastolic (congestive) heart failure: Secondary | ICD-10-CM

## 2020-11-11 DIAGNOSIS — J984 Other disorders of lung: Secondary | ICD-10-CM | POA: Diagnosis not present

## 2020-11-11 DIAGNOSIS — R0600 Dyspnea, unspecified: Secondary | ICD-10-CM | POA: Diagnosis not present

## 2020-11-11 HISTORY — DX: Acute diastolic (congestive) heart failure: I50.31

## 2020-11-13 DIAGNOSIS — E871 Hypo-osmolality and hyponatremia: Secondary | ICD-10-CM | POA: Diagnosis not present

## 2020-11-13 DIAGNOSIS — F17210 Nicotine dependence, cigarettes, uncomplicated: Secondary | ICD-10-CM | POA: Diagnosis not present

## 2020-11-13 DIAGNOSIS — G893 Neoplasm related pain (acute) (chronic): Secondary | ICD-10-CM | POA: Diagnosis not present

## 2020-11-13 DIAGNOSIS — C7951 Secondary malignant neoplasm of bone: Secondary | ICD-10-CM | POA: Diagnosis not present

## 2020-11-13 DIAGNOSIS — I4891 Unspecified atrial fibrillation: Secondary | ICD-10-CM | POA: Diagnosis not present

## 2020-11-13 DIAGNOSIS — C799 Secondary malignant neoplasm of unspecified site: Secondary | ICD-10-CM | POA: Diagnosis not present

## 2020-11-13 DIAGNOSIS — D72829 Elevated white blood cell count, unspecified: Secondary | ICD-10-CM | POA: Diagnosis not present

## 2020-11-13 DIAGNOSIS — C787 Secondary malignant neoplasm of liver and intrahepatic bile duct: Secondary | ICD-10-CM | POA: Diagnosis not present

## 2020-11-13 DIAGNOSIS — C3491 Malignant neoplasm of unspecified part of right bronchus or lung: Secondary | ICD-10-CM | POA: Diagnosis not present

## 2020-11-13 DIAGNOSIS — G4700 Insomnia, unspecified: Secondary | ICD-10-CM | POA: Diagnosis not present

## 2020-11-13 DIAGNOSIS — Z79899 Other long term (current) drug therapy: Secondary | ICD-10-CM | POA: Diagnosis not present

## 2020-11-18 DIAGNOSIS — I483 Typical atrial flutter: Secondary | ICD-10-CM | POA: Diagnosis not present

## 2020-11-18 DIAGNOSIS — I5031 Acute diastolic (congestive) heart failure: Secondary | ICD-10-CM | POA: Diagnosis not present

## 2020-11-19 ENCOUNTER — Encounter: Payer: Self-pay | Admitting: Internal Medicine

## 2020-11-19 ENCOUNTER — Other Ambulatory Visit: Payer: Self-pay

## 2020-11-19 ENCOUNTER — Ambulatory Visit (INDEPENDENT_AMBULATORY_CARE_PROVIDER_SITE_OTHER): Payer: Medicare Other | Admitting: Internal Medicine

## 2020-11-19 VITALS — BP 124/85 | HR 98 | Temp 98.2°F | Resp 18 | Ht 66.0 in | Wt 117.1 lb

## 2020-11-19 DIAGNOSIS — Z7689 Persons encountering health services in other specified circumstances: Secondary | ICD-10-CM

## 2020-11-19 DIAGNOSIS — Z1211 Encounter for screening for malignant neoplasm of colon: Secondary | ICD-10-CM

## 2020-11-19 DIAGNOSIS — C799 Secondary malignant neoplasm of unspecified site: Secondary | ICD-10-CM | POA: Diagnosis not present

## 2020-11-19 DIAGNOSIS — I483 Typical atrial flutter: Secondary | ICD-10-CM | POA: Diagnosis not present

## 2020-11-19 DIAGNOSIS — M8580 Other specified disorders of bone density and structure, unspecified site: Secondary | ICD-10-CM | POA: Diagnosis not present

## 2020-11-19 DIAGNOSIS — Z1231 Encounter for screening mammogram for malignant neoplasm of breast: Secondary | ICD-10-CM

## 2020-11-19 DIAGNOSIS — F419 Anxiety disorder, unspecified: Secondary | ICD-10-CM | POA: Insufficient documentation

## 2020-11-19 DIAGNOSIS — IMO0002 Reserved for concepts with insufficient information to code with codable children: Secondary | ICD-10-CM

## 2020-11-19 DIAGNOSIS — I5032 Chronic diastolic (congestive) heart failure: Secondary | ICD-10-CM

## 2020-11-19 HISTORY — DX: Persons encountering health services in other specified circumstances: Z76.89

## 2020-11-19 HISTORY — DX: Anxiety disorder, unspecified: F41.9

## 2020-11-19 NOTE — Assessment & Plan Note (Signed)
On Metoprolol and Eliquis Follows up with Cardiology and has an appointment with EP Cardiologist at [facility]

## 2020-11-19 NOTE — Assessment & Plan Note (Signed)
On Lasix PRN

## 2020-11-19 NOTE — Assessment & Plan Note (Signed)
On Calcium and Vitamin D supplements

## 2020-11-19 NOTE — Patient Instructions (Signed)
Please continue to take medications as prescribed.  Please follow up with GI for screening colonoscopy.  You are being scheduled to get Mammography done.

## 2020-11-19 NOTE — Assessment & Plan Note (Signed)
S/p chemotherapy Follows up with Oncologist at [facility]

## 2020-11-19 NOTE — Assessment & Plan Note (Signed)
With insomnia On Ativan 1 mg qHS PRN

## 2020-11-19 NOTE — Progress Notes (Signed)
New Patient Office Visit  Subjective:  Patient ID: Cynthia Kelley, female    DOB: 1955/12/15  Age: 65 y.o. MRN: [id number]  CC:  Chief Complaint  Patient presents with  . New Patient (Initial Visit)    New patient former dr Meda Coffee pt (here) just establishing care     HPI Cynthia Kelley is a 65 year old female with PMH of metastatic SCC of right lung - on chemotherapy, atrial flutter, diastolic CHF, knee arthritis, anxiety and osteopenia who presents for establishing care.  She has a history of typical atrial flutter, for which she is on metoprolol and Eliquis.  She was recently referred to EP cardiology for evaluation for cardiac ablation.  She currently denies any chest pain, dyspnea or palpitations.  She has a history of metastatic SCC of the right lung, for which she is getting chemotherapy.  She follows up with Oncologist at [facility].  She also has a history of diastolic CHF, for which she takes Lasix as needed.  She currently denies any LE swelling.  She has history of anxiety and insomnia, for which she takes Ativan 1 mg as needed at bedtime.  She is up to date with COVID and flu vaccine.  Past Medical History:  Diagnosis Date  . Acute diastolic congestive heart failure, NYHA class 2 ([place]) 11/11/2020  . Anemia 11/22/2017  . Anxiety 11/19/2020  . Arthritis   . Asymptomatic PVD (peripheral vascular disease) ([place]) 12/14/2016   Suspected.  Hands hyperemic with decreased cap refill. ? History vasospasm  . FRACTURE, TIBIAL PLATEAU 05/11/2010   Qualifier: Diagnosis of  By: Aline Brochure MD, Dorothyann Peng    . Lung cancer, primary, with metastasis from lung to other site [facility]) 01/16/2018  . Nuclear sclerotic cataract of both eyes 06/08/2018  . Osteopenia 05/15/2013  . Osteoporosis    osteopenia  . Tobacco abuse 12/14/2016    Past Surgical History:  Procedure Laterality Date  . FRACTURE SURGERY     torn ACL  . KNEE ARTHROSCOPY      Family History  Problem Relation Age of Onset  . Cancer  Father        died in 66's everywhere  . Heart disease Mother   . Cancer Mother        lung  . Stroke Mother   . Asthma Daughter     Social History   Socioeconomic History  . Marital status: Married    Spouse name: [name]  . Number of children: 1  . Years of education: 39  . Highest education level: Not on file  Occupational History  . Occupation: Air traffic controller    Comment: retired  Tobacco Use  . Smoking status: Current Every Day Smoker    Packs/day: 0.50    Years: 20.00    Pack years: 10.00    Types: Cigarettes    Start date: 09/28/1963  . Smokeless tobacco: Never Used  Vaping Use  . Vaping Use: Never used  Substance and Sexual Activity  . Alcohol use: Yes    Alcohol/week: 20.0 standard drinks    Types: 20 Cans of beer per week    Comment: 3-4 beers a day  . Drug use: No  . Sexual activity: Yes    Birth control/protection: Post-menopausal  Other Topics Concern  . Not on file  Social History Narrative   Retired Air traffic controller   Lives with husband [name]   Daughter Marcelino Scot is in [place] to walk   Social Determinants of Health  Financial Resource Strain: Not on file  Food Insecurity: Not on file  Transportation Needs: Not on file  Physical Activity: Not on file  Stress: Not on file  Social Connections: Not on file  Intimate Partner Violence: Not on file    ROS Review of Systems  Constitutional: Negative for chills and fever.  HENT: Negative for congestion, sinus pressure, sinus pain and sore throat.   Eyes: Negative for pain and discharge.  Respiratory: Negative for cough and shortness of breath.   Cardiovascular: Negative for chest pain and palpitations.  Gastrointestinal: Negative for abdominal pain, constipation, diarrhea, nausea and vomiting.  Endocrine: Negative for polydipsia and polyuria.  Genitourinary: Negative for dysuria and hematuria.  Musculoskeletal: Negative for neck pain and neck stiffness.  Skin: Negative for rash.  Neurological: Negative  for dizziness and weakness.  Psychiatric/Behavioral: Negative for agitation and behavioral problems.    Objective:   Today's Vitals: BP 124/85 (BP Location: Right Arm, Patient Position: Sitting, Cuff Size: Normal)   Pulse 98   Temp 98.2 F (36.8 C) (Oral)   Resp 18   Ht 5\' 6"  (1.676 m)   Wt 117 lb 1.9 oz (53.1 kg)   SpO2 100%   BMI 18.90 kg/m   Physical Exam Vitals reviewed.  Constitutional:      General: She is not in acute distress.    Appearance: She is not diaphoretic.  HENT:     Head: Normocephalic and atraumatic.     Nose: Nose normal.     Mouth/Throat:     Mouth: Mucous membranes are moist.  Eyes:     General: No scleral icterus.    Extraocular Movements: Extraocular movements intact.     Pupils: Pupils are equal, round, and reactive to light.  Cardiovascular:     Rate and Rhythm: Normal rate. Rhythm irregular.     Pulses: Normal pulses.     Heart sounds: Normal heart sounds. No murmur heard.   Pulmonary:     Breath sounds: Normal breath sounds. No wheezing or rales.  Abdominal:     Palpations: Abdomen is soft.     Tenderness: There is no abdominal tenderness.  Musculoskeletal:     Cervical back: Neck supple. No tenderness.     Right lower leg: No edema.     Left lower leg: No edema.  Skin:    General: Skin is warm.     Findings: No rash.  Neurological:     General: No focal deficit present.     Mental Status: She is alert and oriented to person, place, and time.  Psychiatric:        Mood and Affect: Mood normal.        Behavior: Behavior normal.     Assessment & Plan:   Problem List Items Addressed This Visit      Cardiovascular and Mediastinum   Diastolic congestive heart failure, NYHA class 2 (HCC)    On Lasix PRN      Relevant Medications   furosemide (LASIX) 20 MG tablet   Typical atrial flutter (HCC)    On Metoprolol and Eliquis Follows up with Cardiology and has an appointment with EP Cardiologist at [facility]      Relevant  Medications   furosemide (LASIX) 20 MG tablet     Musculoskeletal and Integument   Osteopenia    On Calcium and Vitamin D supplements        Other   Metastatic squamous cell carcinoma (HCC)    S/p chemotherapy Follows  up with Oncologist at [facility]      Encounter to establish care - Primary    Care established Previous chart reviewed History and medications reviewed with the patient      Anxiety    With insomnia On Ativan 1 mg qHS PRN       Other Visit Diagnoses    Screening mammogram for breast cancer       Relevant Orders   MM 3D SCREEN BREAST BILATERAL   Special screening for malignant neoplasms, colon       Relevant Orders   Ambulatory referral to Gastroenterology      Outpatient Encounter Medications as of 11/19/2020  Medication Sig  . apixaban (ELIQUIS) 5 MG TABS tablet Take by mouth.  . calcium carbonate (OS-CAL - DOSED IN MG OF ELEMENTAL CALCIUM) 1250 (500 Ca) MG tablet Take 1 tablet by mouth 3 (three) times daily with meals.  . furosemide (LASIX) 20 MG tablet Take 20 mg by mouth daily as needed for fluid or edema.  Marland Kitchen LORazepam (ATIVAN) 1 MG tablet Take 1 mg by mouth every 8 (eight) hours.  . metoprolol succinate (TOPROL-XL) 100 MG 24 hr tablet Take by mouth.  . Multiple Vitamins-Minerals (MULTIVITAMIN WITH MINERALS) tablet Take 1 tablet by mouth daily.  . potassium chloride SA (K-DUR,KLOR-CON) 20 MEQ tablet Take 20 mEq by mouth 2 (two) times daily.  . [DISCONTINUED] albuterol (VENTOLIN HFA) 108 (90 Base) MCG/ACT inhaler Inhale 1-2 puffs into the lungs every 6 (six) hours as needed. (Patient not taking: Reported on 11/19/2020)  . [DISCONTINUED] amLODipine (NORVASC) 2.5 MG tablet Take 2.5 mg by mouth daily.  . [DISCONTINUED] benzonatate (TESSALON) 100 MG capsule Take 1 capsule (100 mg total) by mouth 3 (three) times daily as needed for cough. (Patient not taking: Reported on 11/19/2020)  . [DISCONTINUED] furosemide (LASIX) 20 MG tablet Take 1 tablet (20 mg  total) by mouth daily for 5 days.   No facility-administered encounter medications on file as of 11/19/2020.    Follow-up: Return in about 6 months (around 05/19/2021).   Lindell Spar, MD

## 2020-11-19 NOTE — Assessment & Plan Note (Signed)
Care established Previous chart reviewed History and medications reviewed with the patient 

## 2020-11-20 ENCOUNTER — Encounter (INDEPENDENT_AMBULATORY_CARE_PROVIDER_SITE_OTHER): Payer: Self-pay | Admitting: *Deleted

## 2020-11-20 DIAGNOSIS — I4891 Unspecified atrial fibrillation: Secondary | ICD-10-CM | POA: Diagnosis not present

## 2020-11-20 DIAGNOSIS — C787 Secondary malignant neoplasm of liver and intrahepatic bile duct: Secondary | ICD-10-CM | POA: Diagnosis not present

## 2020-11-20 DIAGNOSIS — C3491 Malignant neoplasm of unspecified part of right bronchus or lung: Secondary | ICD-10-CM | POA: Diagnosis not present

## 2020-11-20 DIAGNOSIS — C799 Secondary malignant neoplasm of unspecified site: Secondary | ICD-10-CM | POA: Diagnosis not present

## 2020-11-20 DIAGNOSIS — C7951 Secondary malignant neoplasm of bone: Secondary | ICD-10-CM | POA: Diagnosis not present

## 2020-11-20 DIAGNOSIS — F17210 Nicotine dependence, cigarettes, uncomplicated: Secondary | ICD-10-CM | POA: Diagnosis not present

## 2020-11-20 DIAGNOSIS — Z720 Tobacco use: Secondary | ICD-10-CM | POA: Diagnosis not present

## 2020-11-20 DIAGNOSIS — Z79899 Other long term (current) drug therapy: Secondary | ICD-10-CM | POA: Diagnosis not present

## 2020-11-20 DIAGNOSIS — Z95828 Presence of other vascular implants and grafts: Secondary | ICD-10-CM | POA: Diagnosis not present

## 2020-11-20 DIAGNOSIS — G893 Neoplasm related pain (acute) (chronic): Secondary | ICD-10-CM | POA: Diagnosis not present

## 2020-11-20 DIAGNOSIS — E871 Hypo-osmolality and hyponatremia: Secondary | ICD-10-CM | POA: Diagnosis not present

## 2020-11-27 DIAGNOSIS — Z7689 Persons encountering health services in other specified circumstances: Secondary | ICD-10-CM | POA: Diagnosis not present

## 2020-11-27 DIAGNOSIS — Z95828 Presence of other vascular implants and grafts: Secondary | ICD-10-CM | POA: Diagnosis not present

## 2020-11-27 DIAGNOSIS — Z5111 Encounter for antineoplastic chemotherapy: Secondary | ICD-10-CM | POA: Diagnosis not present

## 2020-11-27 DIAGNOSIS — C3491 Malignant neoplasm of unspecified part of right bronchus or lung: Secondary | ICD-10-CM | POA: Diagnosis not present

## 2020-11-27 DIAGNOSIS — C787 Secondary malignant neoplasm of liver and intrahepatic bile duct: Secondary | ICD-10-CM | POA: Diagnosis not present

## 2020-11-27 DIAGNOSIS — Z79899 Other long term (current) drug therapy: Secondary | ICD-10-CM | POA: Diagnosis not present

## 2020-11-27 DIAGNOSIS — C7951 Secondary malignant neoplasm of bone: Secondary | ICD-10-CM | POA: Diagnosis not present

## 2020-12-02 DIAGNOSIS — I110 Hypertensive heart disease with heart failure: Secondary | ICD-10-CM | POA: Diagnosis not present

## 2020-12-02 DIAGNOSIS — I4819 Other persistent atrial fibrillation: Secondary | ICD-10-CM | POA: Diagnosis not present

## 2020-12-02 DIAGNOSIS — I4891 Unspecified atrial fibrillation: Secondary | ICD-10-CM | POA: Diagnosis not present

## 2020-12-02 DIAGNOSIS — I483 Typical atrial flutter: Secondary | ICD-10-CM | POA: Diagnosis not present

## 2020-12-02 DIAGNOSIS — I5030 Unspecified diastolic (congestive) heart failure: Secondary | ICD-10-CM | POA: Diagnosis not present

## 2020-12-02 DIAGNOSIS — Z7901 Long term (current) use of anticoagulants: Secondary | ICD-10-CM | POA: Diagnosis not present

## 2020-12-02 DIAGNOSIS — Z79899 Other long term (current) drug therapy: Secondary | ICD-10-CM | POA: Diagnosis not present

## 2020-12-03 ENCOUNTER — Ambulatory Visit (HOSPITAL_COMMUNITY): Payer: Medicare Other

## 2020-12-09 DIAGNOSIS — R911 Solitary pulmonary nodule: Secondary | ICD-10-CM | POA: Diagnosis not present

## 2020-12-09 DIAGNOSIS — Z9221 Personal history of antineoplastic chemotherapy: Secondary | ICD-10-CM | POA: Diagnosis not present

## 2020-12-09 DIAGNOSIS — Z923 Personal history of irradiation: Secondary | ICD-10-CM | POA: Diagnosis not present

## 2020-12-09 DIAGNOSIS — C7951 Secondary malignant neoplasm of bone: Secondary | ICD-10-CM | POA: Diagnosis not present

## 2020-12-09 DIAGNOSIS — R590 Localized enlarged lymph nodes: Secondary | ICD-10-CM | POA: Diagnosis not present

## 2020-12-09 DIAGNOSIS — F17210 Nicotine dependence, cigarettes, uncomplicated: Secondary | ICD-10-CM | POA: Diagnosis not present

## 2020-12-09 DIAGNOSIS — C3431 Malignant neoplasm of lower lobe, right bronchus or lung: Secondary | ICD-10-CM | POA: Diagnosis not present

## 2020-12-11 DIAGNOSIS — F17210 Nicotine dependence, cigarettes, uncomplicated: Secondary | ICD-10-CM | POA: Diagnosis not present

## 2020-12-11 DIAGNOSIS — C7951 Secondary malignant neoplasm of bone: Secondary | ICD-10-CM | POA: Diagnosis not present

## 2020-12-11 DIAGNOSIS — E871 Hypo-osmolality and hyponatremia: Secondary | ICD-10-CM | POA: Diagnosis not present

## 2020-12-11 DIAGNOSIS — Z95828 Presence of other vascular implants and grafts: Secondary | ICD-10-CM | POA: Diagnosis not present

## 2020-12-11 DIAGNOSIS — Z7901 Long term (current) use of anticoagulants: Secondary | ICD-10-CM | POA: Diagnosis not present

## 2020-12-11 DIAGNOSIS — C3491 Malignant neoplasm of unspecified part of right bronchus or lung: Secondary | ICD-10-CM | POA: Diagnosis not present

## 2020-12-11 DIAGNOSIS — C787 Secondary malignant neoplasm of liver and intrahepatic bile duct: Secondary | ICD-10-CM | POA: Diagnosis not present

## 2020-12-11 DIAGNOSIS — G893 Neoplasm related pain (acute) (chronic): Secondary | ICD-10-CM | POA: Diagnosis not present

## 2020-12-11 DIAGNOSIS — C3411 Malignant neoplasm of upper lobe, right bronchus or lung: Secondary | ICD-10-CM | POA: Diagnosis not present

## 2020-12-11 DIAGNOSIS — I4891 Unspecified atrial fibrillation: Secondary | ICD-10-CM | POA: Diagnosis not present

## 2020-12-16 DIAGNOSIS — I491 Atrial premature depolarization: Secondary | ICD-10-CM | POA: Diagnosis not present

## 2020-12-16 DIAGNOSIS — I110 Hypertensive heart disease with heart failure: Secondary | ICD-10-CM | POA: Diagnosis not present

## 2020-12-16 DIAGNOSIS — I5030 Unspecified diastolic (congestive) heart failure: Secondary | ICD-10-CM | POA: Diagnosis not present

## 2020-12-16 DIAGNOSIS — I340 Nonrheumatic mitral (valve) insufficiency: Secondary | ICD-10-CM | POA: Diagnosis not present

## 2020-12-16 DIAGNOSIS — I4892 Unspecified atrial flutter: Secondary | ICD-10-CM | POA: Diagnosis not present

## 2020-12-16 DIAGNOSIS — I252 Old myocardial infarction: Secondary | ICD-10-CM | POA: Diagnosis not present

## 2020-12-16 DIAGNOSIS — I483 Typical atrial flutter: Secondary | ICD-10-CM | POA: Diagnosis not present

## 2020-12-16 DIAGNOSIS — Z7901 Long term (current) use of anticoagulants: Secondary | ICD-10-CM | POA: Diagnosis not present

## 2020-12-16 DIAGNOSIS — I4819 Other persistent atrial fibrillation: Secondary | ICD-10-CM | POA: Diagnosis not present

## 2020-12-16 DIAGNOSIS — I4891 Unspecified atrial fibrillation: Secondary | ICD-10-CM | POA: Diagnosis not present

## 2020-12-16 DIAGNOSIS — I341 Nonrheumatic mitral (valve) prolapse: Secondary | ICD-10-CM | POA: Diagnosis not present

## 2020-12-16 DIAGNOSIS — F17210 Nicotine dependence, cigarettes, uncomplicated: Secondary | ICD-10-CM | POA: Diagnosis not present

## 2020-12-18 DIAGNOSIS — F17210 Nicotine dependence, cigarettes, uncomplicated: Secondary | ICD-10-CM | POA: Diagnosis not present

## 2020-12-18 DIAGNOSIS — G893 Neoplasm related pain (acute) (chronic): Secondary | ICD-10-CM | POA: Diagnosis not present

## 2020-12-18 DIAGNOSIS — Z79899 Other long term (current) drug therapy: Secondary | ICD-10-CM | POA: Diagnosis not present

## 2020-12-18 DIAGNOSIS — C787 Secondary malignant neoplasm of liver and intrahepatic bile duct: Secondary | ICD-10-CM | POA: Diagnosis not present

## 2020-12-18 DIAGNOSIS — C7951 Secondary malignant neoplasm of bone: Secondary | ICD-10-CM | POA: Diagnosis not present

## 2020-12-18 DIAGNOSIS — C3491 Malignant neoplasm of unspecified part of right bronchus or lung: Secondary | ICD-10-CM | POA: Diagnosis not present

## 2020-12-18 DIAGNOSIS — E871 Hypo-osmolality and hyponatremia: Secondary | ICD-10-CM | POA: Diagnosis not present

## 2020-12-18 DIAGNOSIS — I4891 Unspecified atrial fibrillation: Secondary | ICD-10-CM | POA: Diagnosis not present

## 2021-01-01 DIAGNOSIS — E871 Hypo-osmolality and hyponatremia: Secondary | ICD-10-CM | POA: Diagnosis not present

## 2021-01-01 DIAGNOSIS — C787 Secondary malignant neoplasm of liver and intrahepatic bile duct: Secondary | ICD-10-CM | POA: Diagnosis not present

## 2021-01-01 DIAGNOSIS — I483 Typical atrial flutter: Secondary | ICD-10-CM | POA: Diagnosis not present

## 2021-01-01 DIAGNOSIS — C3491 Malignant neoplasm of unspecified part of right bronchus or lung: Secondary | ICD-10-CM | POA: Diagnosis not present

## 2021-01-01 DIAGNOSIS — C7951 Secondary malignant neoplasm of bone: Secondary | ICD-10-CM | POA: Diagnosis not present

## 2021-01-01 DIAGNOSIS — I4891 Unspecified atrial fibrillation: Secondary | ICD-10-CM | POA: Diagnosis not present

## 2021-01-01 DIAGNOSIS — G893 Neoplasm related pain (acute) (chronic): Secondary | ICD-10-CM | POA: Diagnosis not present

## 2021-01-01 DIAGNOSIS — F17210 Nicotine dependence, cigarettes, uncomplicated: Secondary | ICD-10-CM | POA: Diagnosis not present

## 2021-01-01 DIAGNOSIS — Z79899 Other long term (current) drug therapy: Secondary | ICD-10-CM | POA: Diagnosis not present

## 2021-01-01 DIAGNOSIS — Z95828 Presence of other vascular implants and grafts: Secondary | ICD-10-CM | POA: Diagnosis not present

## 2021-01-01 DIAGNOSIS — C799 Secondary malignant neoplasm of unspecified site: Secondary | ICD-10-CM | POA: Diagnosis not present

## 2021-01-08 DIAGNOSIS — C7951 Secondary malignant neoplasm of bone: Secondary | ICD-10-CM | POA: Diagnosis not present

## 2021-01-08 DIAGNOSIS — G893 Neoplasm related pain (acute) (chronic): Secondary | ICD-10-CM | POA: Diagnosis not present

## 2021-01-08 DIAGNOSIS — Z95828 Presence of other vascular implants and grafts: Secondary | ICD-10-CM | POA: Diagnosis not present

## 2021-01-08 DIAGNOSIS — Z7689 Persons encountering health services in other specified circumstances: Secondary | ICD-10-CM | POA: Diagnosis not present

## 2021-01-08 DIAGNOSIS — Z79899 Other long term (current) drug therapy: Secondary | ICD-10-CM | POA: Diagnosis not present

## 2021-01-08 DIAGNOSIS — C799 Secondary malignant neoplasm of unspecified site: Secondary | ICD-10-CM | POA: Diagnosis not present

## 2021-01-08 DIAGNOSIS — C787 Secondary malignant neoplasm of liver and intrahepatic bile duct: Secondary | ICD-10-CM | POA: Diagnosis not present

## 2021-01-08 DIAGNOSIS — E871 Hypo-osmolality and hyponatremia: Secondary | ICD-10-CM | POA: Diagnosis not present

## 2021-01-08 DIAGNOSIS — C3491 Malignant neoplasm of unspecified part of right bronchus or lung: Secondary | ICD-10-CM | POA: Diagnosis not present

## 2021-01-08 DIAGNOSIS — F17210 Nicotine dependence, cigarettes, uncomplicated: Secondary | ICD-10-CM | POA: Diagnosis not present

## 2021-01-12 ENCOUNTER — Emergency Department (HOSPITAL_COMMUNITY): Payer: Medicare Other

## 2021-01-12 ENCOUNTER — Inpatient Hospital Stay (HOSPITAL_COMMUNITY): Payer: Medicare Other

## 2021-01-12 ENCOUNTER — Other Ambulatory Visit: Payer: Self-pay

## 2021-01-12 ENCOUNTER — Inpatient Hospital Stay (HOSPITAL_COMMUNITY)
Admission: EM | Admit: 2021-01-12 | Discharge: 2021-01-15 | DRG: 193 | Disposition: A | Discharge status: Home / Self Care | Payer: Medicare Other | Attending: Internal Medicine | Admitting: Internal Medicine

## 2021-01-12 ENCOUNTER — Encounter (HOSPITAL_COMMUNITY): Payer: Self-pay | Admitting: Emergency Medicine

## 2021-01-12 DIAGNOSIS — J441 Chronic obstructive pulmonary disease with (acute) exacerbation: Secondary | ICD-10-CM

## 2021-01-12 DIAGNOSIS — I739 Peripheral vascular disease, unspecified: Secondary | ICD-10-CM | POA: Diagnosis present

## 2021-01-12 DIAGNOSIS — Z20822 Contact with and (suspected) exposure to covid-19: Secondary | ICD-10-CM | POA: Diagnosis present

## 2021-01-12 DIAGNOSIS — M810 Age-related osteoporosis without current pathological fracture: Secondary | ICD-10-CM | POA: Diagnosis not present

## 2021-01-12 DIAGNOSIS — F1020 Alcohol dependence, uncomplicated: Secondary | ICD-10-CM | POA: Diagnosis not present

## 2021-01-12 DIAGNOSIS — M1990 Unspecified osteoarthritis, unspecified site: Secondary | ICD-10-CM | POA: Diagnosis present

## 2021-01-12 DIAGNOSIS — J440 Chronic obstructive pulmonary disease with acute lower respiratory infection: Secondary | ICD-10-CM | POA: Diagnosis not present

## 2021-01-12 DIAGNOSIS — E876 Hypokalemia: Secondary | ICD-10-CM | POA: Diagnosis present

## 2021-01-12 DIAGNOSIS — E222 Syndrome of inappropriate secretion of antidiuretic hormone: Secondary | ICD-10-CM | POA: Diagnosis present

## 2021-01-12 DIAGNOSIS — J189 Pneumonia, unspecified organism: Principal | ICD-10-CM | POA: Diagnosis present

## 2021-01-12 DIAGNOSIS — E871 Hypo-osmolality and hyponatremia: Secondary | ICD-10-CM | POA: Diagnosis present

## 2021-01-12 DIAGNOSIS — F419 Anxiety disorder, unspecified: Secondary | ICD-10-CM | POA: Diagnosis present

## 2021-01-12 DIAGNOSIS — I509 Heart failure, unspecified: Secondary | ICD-10-CM

## 2021-01-12 DIAGNOSIS — I480 Paroxysmal atrial fibrillation: Secondary | ICD-10-CM | POA: Diagnosis not present

## 2021-01-12 DIAGNOSIS — F10139 Alcohol abuse with withdrawal, unspecified: Secondary | ICD-10-CM | POA: Diagnosis not present

## 2021-01-12 DIAGNOSIS — Z7901 Long term (current) use of anticoagulants: Secondary | ICD-10-CM | POA: Diagnosis not present

## 2021-01-12 DIAGNOSIS — R6889 Other general symptoms and signs: Secondary | ICD-10-CM | POA: Diagnosis not present

## 2021-01-12 DIAGNOSIS — Z79899 Other long term (current) drug therapy: Secondary | ICD-10-CM | POA: Diagnosis not present

## 2021-01-12 DIAGNOSIS — Z825 Family history of asthma and other chronic lower respiratory diseases: Secondary | ICD-10-CM

## 2021-01-12 DIAGNOSIS — J9601 Acute respiratory failure with hypoxia: Secondary | ICD-10-CM | POA: Diagnosis present

## 2021-01-12 DIAGNOSIS — I5033 Acute on chronic diastolic (congestive) heart failure: Secondary | ICD-10-CM | POA: Diagnosis not present

## 2021-01-12 DIAGNOSIS — F17210 Nicotine dependence, cigarettes, uncomplicated: Secondary | ICD-10-CM | POA: Diagnosis not present

## 2021-01-12 DIAGNOSIS — J470 Bronchiectasis with acute lower respiratory infection: Secondary | ICD-10-CM | POA: Diagnosis present

## 2021-01-12 DIAGNOSIS — J811 Chronic pulmonary edema: Secondary | ICD-10-CM | POA: Diagnosis not present

## 2021-01-12 DIAGNOSIS — I499 Cardiac arrhythmia, unspecified: Secondary | ICD-10-CM | POA: Diagnosis not present

## 2021-01-12 DIAGNOSIS — J9602 Acute respiratory failure with hypercapnia: Secondary | ICD-10-CM | POA: Diagnosis not present

## 2021-01-12 DIAGNOSIS — Z8249 Family history of ischemic heart disease and other diseases of the circulatory system: Secondary | ICD-10-CM

## 2021-01-12 DIAGNOSIS — Z720 Tobacco use: Secondary | ICD-10-CM | POA: Diagnosis not present

## 2021-01-12 DIAGNOSIS — R339 Retention of urine, unspecified: Secondary | ICD-10-CM | POA: Diagnosis not present

## 2021-01-12 DIAGNOSIS — C3490 Malignant neoplasm of unspecified part of unspecified bronchus or lung: Secondary | ICD-10-CM | POA: Diagnosis not present

## 2021-01-12 DIAGNOSIS — R0689 Other abnormalities of breathing: Secondary | ICD-10-CM | POA: Diagnosis not present

## 2021-01-12 DIAGNOSIS — F10230 Alcohol dependence with withdrawal, uncomplicated: Secondary | ICD-10-CM | POA: Diagnosis not present

## 2021-01-12 DIAGNOSIS — C7951 Secondary malignant neoplasm of bone: Secondary | ICD-10-CM | POA: Diagnosis present

## 2021-01-12 DIAGNOSIS — R0602 Shortness of breath: Secondary | ICD-10-CM | POA: Diagnosis not present

## 2021-01-12 DIAGNOSIS — C787 Secondary malignant neoplasm of liver and intrahepatic bile duct: Secondary | ICD-10-CM | POA: Diagnosis present

## 2021-01-12 DIAGNOSIS — I517 Cardiomegaly: Secondary | ICD-10-CM | POA: Diagnosis not present

## 2021-01-12 DIAGNOSIS — Z743 Need for continuous supervision: Secondary | ICD-10-CM | POA: Diagnosis not present

## 2021-01-12 DIAGNOSIS — F10231 Alcohol dependence with withdrawal delirium: Secondary | ICD-10-CM | POA: Diagnosis not present

## 2021-01-12 DIAGNOSIS — M8580 Other specified disorders of bone density and structure, unspecified site: Secondary | ICD-10-CM | POA: Diagnosis not present

## 2021-01-12 LAB — URINALYSIS, ROUTINE W REFLEX MICROSCOPIC
Bacteria, UA: NONE SEEN
Bilirubin Urine: NEGATIVE
Glucose, UA: NEGATIVE mg/dL
Ketones, ur: NEGATIVE mg/dL
Leukocytes,Ua: NEGATIVE
Nitrite: NEGATIVE
Protein, ur: 30 mg/dL — AB
Specific Gravity, Urine: 1.03 (ref 1.005–1.030)
pH: 6 (ref 5.0–8.0)

## 2021-01-12 LAB — BLOOD GAS, ARTERIAL
Acid-base deficit: 7.3 mmol/L — ABNORMAL HIGH (ref 0.0–2.0)
Acid-base deficit: 0.9 mmol/L (ref 0.0–2.0)
Bicarbonate: 17 mmol/L — ABNORMAL LOW (ref 20.0–28.0)
Bicarbonate: 23.1 mmol/L (ref 20.0–28.0)
FIO2: 36
FIO2: 36
O2 Saturation: 54.3 %
O2 Saturation: 94.9 %
Patient temperature: 37
Patient temperature: 37
pCO2 arterial: 59.7 mmHg — ABNORMAL HIGH (ref 32.0–48.0)
pCO2 arterial: 51.3 mmHg — ABNORMAL HIGH (ref 32.0–48.0)
pH, Arterial: 7.155 — CL (ref 7.350–7.450)
pH, Arterial: 7.303 — ABNORMAL LOW (ref 7.350–7.450)
pO2, Arterial: 34.7 mmHg — CL (ref 83.0–108.0)
pO2, Arterial: 80 mmHg — ABNORMAL LOW (ref 83.0–108.0)

## 2021-01-12 LAB — COMPREHENSIVE METABOLIC PANEL
ALT: 34 U/L (ref 0–44)
AST: 73 U/L — ABNORMAL HIGH (ref 15–41)
Albumin: 4 g/dL (ref 3.5–5.0)
Alkaline Phosphatase: 92 U/L (ref 38–126)
Anion gap: 8 (ref 5–15)
BUN: 7 mg/dL — ABNORMAL LOW (ref 8–23)
CO2: 22 mmol/L (ref 22–32)
Calcium: 8.6 mg/dL — ABNORMAL LOW (ref 8.9–10.3)
Chloride: 95 mmol/L — ABNORMAL LOW (ref 98–111)
Creatinine, Ser: 0.55 mg/dL (ref 0.44–1.00)
GFR, Estimated: >60 mL/min (ref >60)
Glucose, Bld: 113 mg/dL — ABNORMAL HIGH (ref 70–99)
Potassium: 4.5 mmol/L (ref 3.5–5.1)
Sodium: 125 mmol/L — ABNORMAL LOW (ref 135–145)
Total Bilirubin: 1.4 mg/dL — ABNORMAL HIGH (ref 0.3–1.2)
Total Protein: 7.6 g/dL (ref 6.5–8.1)

## 2021-01-12 LAB — CBC WITH DIFFERENTIAL/PLATELET
Basophils Absolute: 0 10*3/uL (ref 0.0–0.1)
Basophils Relative: 0 %
Eosinophils Absolute: 0 10*3/uL (ref 0.0–0.5)
Eosinophils Relative: 0 %
HCT: 35.5 % — ABNORMAL LOW (ref 36.0–46.0)
Hemoglobin: 12 g/dL (ref 12.0–15.0)
Lymphocytes Relative: 5 %
Lymphs Abs: 2.3 10*3/uL (ref 0.7–4.0)
MCH: 37.6 pg — ABNORMAL HIGH (ref 26.0–34.0)
MCHC: 33.8 g/dL (ref 30.0–36.0)
MCV: 111.3 fL — ABNORMAL HIGH (ref 80.0–100.0)
Monocytes Absolute: 1.4 10*3/uL — ABNORMAL HIGH (ref 0.1–1.0)
Monocytes Relative: 3 %
Neutro Abs: 41.9 10*3/uL — ABNORMAL HIGH (ref 1.7–7.7)
Neutrophils Relative %: 92 %
Platelets: 219 10*3/uL (ref 150–400)
RBC: 3.19 MIL/uL — ABNORMAL LOW (ref 3.87–5.11)
RDW: 18.9 % — ABNORMAL HIGH (ref 11.5–15.5)
WBC: 45.5 10*3/uL — ABNORMAL HIGH (ref 4.0–10.5)
nRBC: 0 % (ref 0.0–0.2)

## 2021-01-12 LAB — ECHOCARDIOGRAM COMPLETE
AR max vel: 1.71 cm2
AV Area VTI: 2.06 cm2
AV Area mean vel: 1.98 cm2
AV Mean grad: 3.9 mmHg
AV Peak grad: 8.2 mmHg
Ao pk vel: 1.44 m/s
Area-P 1/2: 3.43 cm2
Height: 69 in
S' Lateral: 3.4 cm
Weight: 1936.52 oz

## 2021-01-12 LAB — TSH: TSH: 0.934 u[IU]/mL (ref 0.350–4.500)

## 2021-01-12 LAB — TROPONIN I (HIGH SENSITIVITY)
Troponin I (High Sensitivity): 11 ng/L (ref <18)
Troponin I (High Sensitivity): 14 ng/L (ref <18)

## 2021-01-12 LAB — MRSA PCR SCREENING: MRSA by PCR: NEGATIVE

## 2021-01-12 LAB — LACTIC ACID, PLASMA
Lactic Acid, Venous: 1.5 mmol/L (ref 0.5–1.9)
Lactic Acid, Venous: 1.4 mmol/L (ref 0.5–1.9)

## 2021-01-12 LAB — RESP PANEL BY RT-PCR (FLU A&B, COVID) ARPGX2
Influenza A by PCR: NEGATIVE
Influenza B by PCR: NEGATIVE
SARS Coronavirus 2 by RT PCR: NEGATIVE

## 2021-01-12 LAB — BRAIN NATRIURETIC PEPTIDE: B Natriuretic Peptide: 2115 pg/mL — ABNORMAL HIGH (ref 0.0–100.0)

## 2021-01-12 LAB — HIV ANTIBODY (ROUTINE TESTING W REFLEX): HIV Screen 4th Generation wRfx: NONREACTIVE

## 2021-01-12 MED ORDER — FOLIC ACID 1 MG PO TABS
1.0000 mg | ORAL_TABLET | Freq: Every day | ORAL | Status: DC
  Administered 2021-01-12 – 2021-01-15 (×4): 1 mg via ORAL
  Filled 2021-01-12 (×4): qty 1

## 2021-01-12 MED ORDER — METHYLPREDNISOLONE SODIUM SUCC 125 MG IJ SOLR
125.0000 mg | Freq: Once | INTRAMUSCULAR | Status: AC
  Administered 2021-01-12: 125 mg via INTRAVENOUS
  Filled 2021-01-12: qty 2

## 2021-01-12 MED ORDER — APIXABAN 5 MG PO TABS
5.0000 mg | ORAL_TABLET | Freq: Two times a day (BID) | ORAL | Status: DC
  Administered 2021-01-12 – 2021-01-15 (×7): 5 mg via ORAL
  Filled 2021-01-12 (×7): qty 1

## 2021-01-12 MED ORDER — ACETAMINOPHEN 650 MG RE SUPP: 650.0000 mg | Freq: Four times a day (QID) | RECTAL | Status: DC | PRN

## 2021-01-12 MED ORDER — LORAZEPAM 2 MG/ML IJ SOLN
1.0000 mg | INTRAMUSCULAR | Status: AC | PRN
  Administered 2021-01-12: 4 mg via INTRAVENOUS
  Administered 2021-01-12: 2 mg via INTRAVENOUS
  Administered 2021-01-12: 4 mg via INTRAVENOUS
  Administered 2021-01-12 – 2021-01-13 (×2): 2 mg via INTRAVENOUS
  Filled 2021-01-12: qty 2
  Filled 2021-01-12: qty 1
  Filled 2021-01-12: qty 2
  Filled 2021-01-12: qty 1

## 2021-01-12 MED ORDER — ADULT MULTIVITAMIN W/MINERALS CH
1.0000 | ORAL_TABLET | Freq: Every day | ORAL | Status: DC
  Administered 2021-01-12 – 2021-01-15 (×4): 1 via ORAL
  Filled 2021-01-12 (×4): qty 1

## 2021-01-12 MED ORDER — IPRATROPIUM-ALBUTEROL 0.5-2.5 (3) MG/3ML IN SOLN
3.0000 mL | Freq: Once | RESPIRATORY_TRACT | Status: AC
  Administered 2021-01-12: 3 mL via RESPIRATORY_TRACT
  Filled 2021-01-12: qty 3

## 2021-01-12 MED ORDER — OXYCODONE HCL 5 MG PO TABS: 5.0000 mg | ORAL_TABLET | ORAL | Status: DC | PRN

## 2021-01-12 MED ORDER — METHYLPREDNISOLONE SODIUM SUCC 125 MG IJ SOLR
60.0000 mg | Freq: Two times a day (BID) | INTRAMUSCULAR | Status: DC
  Administered 2021-01-12 – 2021-01-14 (×5): 60 mg via INTRAVENOUS
  Filled 2021-01-12 (×5): qty 2

## 2021-01-12 MED ORDER — ONDANSETRON HCL 4 MG PO TABS: 4.0000 mg | ORAL_TABLET | Freq: Four times a day (QID) | ORAL | Status: DC | PRN

## 2021-01-12 MED ORDER — LORAZEPAM 2 MG/ML IJ SOLN
0.0000 mg | Freq: Two times a day (BID) | INTRAMUSCULAR | Status: DC
Start: 2021-01-14 — End: 2021-01-13

## 2021-01-12 MED ORDER — NICOTINE 14 MG/24HR TD PT24
14.0000 mg | MEDICATED_PATCH | Freq: Every day | TRANSDERMAL | Status: DC
  Administered 2021-01-12 – 2021-01-15 (×4): 14 mg via TRANSDERMAL
  Filled 2021-01-12 (×4): qty 1

## 2021-01-12 MED ORDER — ONDANSETRON HCL 4 MG/2ML IJ SOLN: 4.0000 mg | Freq: Four times a day (QID) | INTRAMUSCULAR | Status: DC | PRN

## 2021-01-12 MED ORDER — ACETAMINOPHEN 325 MG PO TABS: 650.0000 mg | ORAL_TABLET | Freq: Four times a day (QID) | ORAL | Status: DC | PRN

## 2021-01-12 MED ORDER — MAGNESIUM SULFATE 2 GM/50ML IV SOLN
2.0000 g | Freq: Once | INTRAVENOUS | Status: AC
  Administered 2021-01-12: 2 g via INTRAVENOUS
  Filled 2021-01-12: qty 50

## 2021-01-12 MED ORDER — VANCOMYCIN HCL 1250 MG/250ML IV SOLN
1250.0000 mg | Freq: Every day | INTRAVENOUS | Status: DC
  Administered 2021-01-12: 1250 mg via INTRAVENOUS
  Filled 2021-01-12: qty 250

## 2021-01-12 MED ORDER — METOPROLOL SUCCINATE ER 50 MG PO TB24
100.0000 mg | ORAL_TABLET | Freq: Every day | ORAL | Status: DC
  Administered 2021-01-12 – 2021-01-14 (×3): 100 mg via ORAL
  Filled 2021-01-12 (×3): qty 2

## 2021-01-12 MED ORDER — CHLORHEXIDINE GLUCONATE CLOTH 2 % EX PADS
6.0000 | MEDICATED_PAD | Freq: Every day | CUTANEOUS | Status: DC
  Administered 2021-01-12 – 2021-01-15 (×3): 6 via TOPICAL

## 2021-01-12 MED ORDER — FUROSEMIDE 10 MG/ML IJ SOLN
40.0000 mg | Freq: Two times a day (BID) | INTRAMUSCULAR | Status: DC
  Administered 2021-01-12 – 2021-01-14 (×5): 40 mg via INTRAVENOUS
  Filled 2021-01-12 (×5): qty 4

## 2021-01-12 MED ORDER — ARFORMOTEROL TARTRATE 15 MCG/2ML IN NEBU
15.0000 ug | INHALATION_SOLUTION | Freq: Two times a day (BID) | RESPIRATORY_TRACT | Status: DC
  Administered 2021-01-12 – 2021-01-15 (×7): 15 ug via RESPIRATORY_TRACT
  Filled 2021-01-12 (×7): qty 2

## 2021-01-12 MED ORDER — THIAMINE HCL 100 MG PO TABS
100.0000 mg | ORAL_TABLET | Freq: Every day | ORAL | Status: DC
  Administered 2021-01-12 – 2021-01-15 (×4): 100 mg via ORAL
  Filled 2021-01-12 (×4): qty 1

## 2021-01-12 MED ORDER — IOHEXOL 350 MG/ML SOLN
100.0000 mL | Freq: Once | INTRAVENOUS | Status: AC | PRN
  Administered 2021-01-12: 68 mL via INTRAVENOUS

## 2021-01-12 MED ORDER — LORAZEPAM 2 MG/ML IJ SOLN
0.0000 mg | Freq: Four times a day (QID) | INTRAMUSCULAR | Status: DC
  Administered 2021-01-13: 4 mg via INTRAVENOUS
  Filled 2021-01-12: qty 1
  Filled 2021-01-12 (×2): qty 2

## 2021-01-12 MED ORDER — IPRATROPIUM-ALBUTEROL 0.5-2.5 (3) MG/3ML IN SOLN
3.0000 mL | Freq: Four times a day (QID) | RESPIRATORY_TRACT | Status: DC
  Administered 2021-01-12: 3 mL via RESPIRATORY_TRACT
  Filled 2021-01-12: qty 3

## 2021-01-12 MED ORDER — THIAMINE HCL 100 MG/ML IJ SOLN: 100.0000 mg | Freq: Every day | INTRAMUSCULAR | Status: DC

## 2021-01-12 MED ORDER — IPRATROPIUM-ALBUTEROL 0.5-2.5 (3) MG/3ML IN SOLN
3.0000 mL | Freq: Two times a day (BID) | RESPIRATORY_TRACT | Status: DC
  Administered 2021-01-12 – 2021-01-15 (×6): 3 mL via RESPIRATORY_TRACT
  Filled 2021-01-12 (×6): qty 3

## 2021-01-12 MED ORDER — LORAZEPAM 2 MG/ML IJ SOLN
0.5000 mg | Freq: Once | INTRAMUSCULAR | Status: AC
  Administered 2021-01-12: 0.5 mg via INTRAVENOUS
  Filled 2021-01-12: qty 1

## 2021-01-12 MED ORDER — SODIUM CHLORIDE 0.9 % IV BOLUS: 1000.0000 mL | Freq: Once | INTRAVENOUS | Status: DC

## 2021-01-12 MED ORDER — FUROSEMIDE 10 MG/ML IJ SOLN
40.0000 mg | Freq: Once | INTRAMUSCULAR | Status: AC
  Administered 2021-01-12: 40 mg via INTRAVENOUS
  Filled 2021-01-12: qty 4

## 2021-01-12 MED ORDER — LORAZEPAM 1 MG PO TABS
1.0000 mg | ORAL_TABLET | Freq: Three times a day (TID) | ORAL | Status: DC
  Administered 2021-01-12 (×2): 1 mg via ORAL
  Filled 2021-01-12 (×2): qty 1

## 2021-01-12 MED ORDER — SODIUM CHLORIDE 0.9 % IV SOLN
2.0000 g | Freq: Once | INTRAVENOUS | Status: AC
  Administered 2021-01-12: 2 g via INTRAVENOUS
  Filled 2021-01-12: qty 2

## 2021-01-12 MED ORDER — DM-GUAIFENESIN ER 30-600 MG PO TB12
1.0000 | ORAL_TABLET | Freq: Two times a day (BID) | ORAL | Status: DC
  Administered 2021-01-12 – 2021-01-15 (×7): 1 via ORAL
  Filled 2021-01-12 (×7): qty 1

## 2021-01-12 MED ORDER — BUDESONIDE 0.5 MG/2ML IN SUSP
0.5000 mg | Freq: Two times a day (BID) | RESPIRATORY_TRACT | Status: DC
  Administered 2021-01-12 – 2021-01-15 (×7): 0.5 mg via RESPIRATORY_TRACT
  Filled 2021-01-12 (×7): qty 2

## 2021-01-12 MED ORDER — SODIUM CHLORIDE 0.9 % IV SOLN
2.0000 g | Freq: Three times a day (TID) | INTRAVENOUS | Status: DC
  Administered 2021-01-12 – 2021-01-15 (×9): 2 g via INTRAVENOUS
  Filled 2021-01-12 (×9): qty 2

## 2021-01-12 MED ORDER — LORAZEPAM 1 MG PO TABS: 1.0000 mg | ORAL_TABLET | ORAL | Status: AC | PRN

## 2021-01-12 NOTE — ED Notes (Signed)
Pt with implanted R portacath. Accessed using 20g 1 in needle per protocol. Pt tolerated well. Good blood return noted.

## 2021-01-12 NOTE — ED Notes (Signed)
Blood gas most likely venous.

## 2021-01-12 NOTE — Progress Notes (Signed)
*  PRELIMINARY RESULTS* Echocardiogram 2D Echocardiogram has been performed.  Cynthia Kelley 01/12/2021, 2:09 PM

## 2021-01-12 NOTE — Progress Notes (Signed)
Patient seen and examined.  Admitted after midnight secondary to shortness of breath and hypoxia.  Work-up suggesting multifactorial etiologies, including: COPD with acute exacerbation, concern for vascular congestion in the setting of acute on chronic CHF and also community-acquired pneumonia.  Patient has been started on bronchodilator management, IV antibiotics and IV Lasix.  Will follow 2D echo.  Wean oxygen supplementation as tolerated.  Currently hemodynamically stable.  Please refer to H&P written by Dr. Clearence Ped for further info/details on admission.  Barton Dubois MD [phone]

## 2021-01-12 NOTE — H&P (Signed)
TRH H&P    Patient Demographics:    Cynthia Kelley, is a 65 y.o. female  MRN: [id number]  DOB - 21-Aug-1956  Admit Date - 01/12/2021  Referring MD/NP/PA: Rancour  Outpatient Primary MD for the patient is Lindell Spar, MD  Patient coming from: Home  Chief complaint- Dyspnea   HPI:    Cynthia Kelley  is a 65 y.o. female, with history of tobacco abuse, lung cancer, peripheral vascular disease, anxiety, acute diastolic congestive heart failure, and more presents the ED with a chief complaint of dyspnea.  Patient reports that in the middle of the night she was woke up from sleep by acute dyspnea.  She reports that this triggered a panic attack and she does not know if there was anything that made the dyspnea better or worse.  She remembers palpitations, but denies chest pain.  Patient reports that everything is not blurred because she was panicking.  She does report that prior to going to bed she was in her normal state of health aside from some sinus congestion.  She does have lung cancer for which she is getting chemo.  Her last Neulasta treatment was 2 weeks ago.  Patient denies any fevers.  She denies any weight gain or leg swelling over the last few days.  She does report that she had difficulty with peripheral edema several months ago when she was started on as needed Lasix at that time.  Patient does not recall having a history of congestive heart failure but it is in her chart from November 11, 2020.  There is no echo in our system.  Patient is still currently smoking 10 cigarettes/day.  She drinks alcohol 2-3 beers per day and a mixed drink.  She does not use illicit drugs.  Patient is vaccinated for COVID.  Patient is full code.  In the ED when patient he mentioned in respiratory distress and tripoding.  Initially were considering putting her on BiPAP, but after he being given a dose of Ativan patient calmed down and her  oxygen saturations were able to be maintained on 4 L nasal cannula and then weaned down to 2 L nasal cannula upon my exam.  Patient was afebrile, heart rate was 74-81, respiratory rate was 19-26, blood pressure stable at 147/69, satting at 100% on 4 L.  She was satting at 95% on 2 L upon my exam.  Patient had initial ABG that was concerning with a pH of 7.155, CO2 59.7, O2 34.7.  Repeat ABG showed a pH of 7.3, CO2 51.3, O2 80.  Patient is on Eliquis for paroxysmal A.  Fibrillation, but given the acute onset shortness of breath CTA was done to rule out PE.  It showed diffuse vascular redistribution.  Peribronchovascular thickening and bilateral effusions favoring pulmonary edema.  Additional areas of mixed patchy groundglass and consolidative opacity in the lungs could reflect developing alveolar edema though an acute infectious or inflammatory process is difficult to fully exclude.  Chest x-ray was read as focal left lower lobe opacity.  EKG was in  sinus rhythm.  Patient was given Lasix, steroids, mag, Ativan in the ED.  Lactic acid is still pending.  Patient was started on vancomycin in the ED.  She had leukocytosis with a white blood cell count of 45.5, hemoglobin 12.  She had a chemistry panel that revealed a hyponatremia at 125, patient seems to have chronic hyponatremia around 129 130. Admission was requested for further management of acute hypoxic respiratory failure and hypercapnia.   Review of systems:    In addition to the HPI above,  No Fever-chills, No Headache, No changes with Vision or hearing, No problems swallowing food or Liquids, No Chest pain, no change in cough, admits to dyspnea  No Abdominal pain, No Nausea or Vomiting, bowel movements are regular, No Blood in stool or Urine, No dysuria, No new skin rashes or bruises, No new joints pains-aches,  No new weakness, tingling, numbness in any extremity, No recent weight gain or loss, No polyuria, polydypsia or polyphagia,   All  other systems reviewed and are negative.    Past History of the following :    Past Medical History:  Diagnosis Date  . Acute diastolic congestive heart failure, NYHA class 2 ([place]) 11/11/2020  . Anemia 11/22/2017  . Anxiety 11/19/2020  . Arthritis   . Asymptomatic PVD (peripheral vascular disease) ([place]) 12/14/2016   Suspected.  Hands hyperemic with decreased cap refill. ? History vasospasm  . FRACTURE, TIBIAL PLATEAU 05/11/2010   Qualifier: Diagnosis of  By: Aline Brochure MD, Dorothyann Peng    . Lung cancer, primary, with metastasis from lung to other site [facility]) 01/16/2018  . Nuclear sclerotic cataract of both eyes 06/08/2018  . Osteopenia 05/15/2013  . Osteoporosis    osteopenia  . Tobacco abuse 12/14/2016      Past Surgical History:  Procedure Laterality Date  . FRACTURE SURGERY     torn ACL  . KNEE ARTHROSCOPY        Social History:      Social History   Tobacco Use  . Smoking status: Current Every Day Smoker    Packs/day: 0.50    Years: 20.00    Pack years: 10.00    Types: Cigarettes    Start date: 09/28/1963  . Smokeless tobacco: Never Used  Substance Use Topics  . Alcohol use: Yes    Alcohol/week: 20.0 standard drinks    Types: 20 Cans of beer per week    Comment: 3-4 beers a day       Family History :     Family History  Problem Relation Age of Onset  . Cancer Father        died in 70's everywhere  . Heart disease Mother   . Cancer Mother        lung  . Stroke Mother   . Asthma Daughter       Home Medications:   Prior to Admission medications   Medication Sig Start Date End Date Taking? Authorizing Provider  apixaban (ELIQUIS) 5 MG TABS tablet Take by mouth. 09/27/20   [provider]  calcium carbonate (OS-CAL - DOSED IN MG OF ELEMENTAL CALCIUM) 1250 (500 Ca) MG tablet Take 1 tablet by mouth 3 (three) times daily with meals.    [provider]  furosemide (LASIX) 20 MG tablet Take 20 mg by mouth daily as needed for fluid or edema.     [provider]  LORazepam (ATIVAN) 1 MG tablet Take 1 mg by mouth every 8 (eight) hours.    [provider]  metoprolol succinate (TOPROL-XL) 100 MG 24 hr tablet Take by mouth. 09/28/20   [provider]  Multiple Vitamins-Minerals (MULTIVITAMIN WITH MINERALS) tablet Take 1 tablet by mouth daily.    [provider]  potassium chloride SA (K-DUR,KLOR-CON) 20 MEQ tablet Take 20 mEq by mouth 2 (two) times daily.    [provider]     Allergies:    No Known Allergies   Physical Exam:   Vitals  Blood pressure (!) 160/77, pulse 99, temperature 98.4 F (36.9 C), temperature source Oral, resp. rate 15, height 5\' 6"  (1.676 m), weight 59 kg, SpO2 97 %.  1.  General:  Patient laying supine in bed, no acute distress  2. Psychiatric: Mood and behavior are normal for situation, patient is alert, oriented x 3, cooperative with exam  3. Neurologic: Face is symmetric, moves all 4 extremities voluntarily, speech and language are normal, alert and oriented, no acute deficit on limited exam  4. HEENMT:  Head is atraumatic, normocephalic, mucous membranes moist, pupils reactive, neck supple, trachea midline  5. Respiratory : Lungs are with mild wheezing BL, fine crackles at the bases, no increased work of breathing, no accessory muscle use, no rhonchi, clubbing present, no cyanosis  6. Cardiovascular : Heart rate in normal, rhythm is regular, no murmurs, rubs or gallops, trace peripheral edema  7. Gastrointestinal:  Abdomen is soft, non tender, non distended, bowel sounds active.   8. Skin:  Bruising on BL shins, skin warm, dry and intact on limited exam  9.Musculoskeletal:  Trace peripheral edema, peripheral pulses palpated, no calf tenderness     Data Review:    CBC Recent Labs  Lab 01/12/21 0144  WBC 45.5*  HGB 12.0  HCT 35.5*  PLT 219  MCV 111.3*  MCH 37.6*  MCHC 33.8  RDW 18.9*  LYMPHSABS 2.3  MONOABS 1.4*  EOSABS 0.0   BASOSABS 0.0   ------------------------------------------------------------------------------------------------------------------  Results for orders placed or performed during the hospital encounter of 01/12/21 (from the past 48 hour(s))  CBC with Differential/Platelet     Status: Abnormal   Collection Time: 01/12/21  1:44 AM  Result Value Ref Range   WBC 45.5 (H) 4.0 - 10.5 K/uL   RBC 3.19 (L) 3.87 - 5.11 MIL/uL   Hemoglobin 12.0 12.0 - 15.0 g/dL   HCT 35.5 (L) 36.0 - 46.0 %   MCV 111.3 (H) 80.0 - 100.0 fL   MCH 37.6 (H) 26.0 - 34.0 pg   MCHC 33.8 30.0 - 36.0 g/dL   RDW 18.9 (H) 11.5 - 15.5 %   Platelets 219 150 - 400 K/uL   nRBC 0.0 0.0 - 0.2 %   Neutrophils Relative % 92 %   Neutro Abs 41.9 (H) 1.7 - 7.7 K/uL   Lymphocytes Relative 5 %   Lymphs Abs 2.3 0.7 - 4.0 K/uL   Monocytes Relative 3 %   Monocytes Absolute 1.4 (H) 0.1 - 1.0 K/uL   Eosinophils Relative 0 %   Eosinophils Absolute 0.0 0.0 - 0.5 K/uL   Basophils Relative 0 %   Basophils Absolute 0.0 0.0 - 0.1 K/uL    Comment: Performed at [facility], [address], [place], [place] [address]  Comprehensive metabolic panel     Status: Abnormal   Collection Time: 01/12/21  1:44 AM  Result Value Ref Range   Sodium 125 (L) 135 - 145 mmol/L   Potassium 4.5 3.5 - 5.1 mmol/L   Chloride 95 (L) 98 - 111 mmol/L  CO2 22 22 - 32 mmol/L   Glucose, Bld 113 (H) 70 - 99 mg/dL    Comment: Glucose reference range applies only to samples taken after fasting for at least 8 hours.   BUN 7 (L) 8 - 23 mg/dL   Creatinine, Ser 0.55 0.44 - 1.00 mg/dL   Calcium 8.6 (L) 8.9 - 10.3 mg/dL   Total Protein 7.6 6.5 - 8.1 g/dL   Albumin 4.0 3.5 - 5.0 g/dL   AST 73 (H) 15 - 41 U/L   ALT 34 0 - 44 U/L   Alkaline Phosphatase 92 38 - 126 U/L   Total Bilirubin 1.4 (H) 0.3 - 1.2 mg/dL   GFR, Estimated >60 >60 mL/min    Comment: (NOTE) Calculated using the CKD-EPI Creatinine Equation (2021)    Anion gap 8 5 - 15    Comment: Performed at [facility], [address] [place], [place] [address]  Troponin I (High Sensitivity)     Status: None   Collection Time: 01/12/21  1:44 AM  Result Value Ref Range   Troponin I (High Sensitivity) 11 <18 ng/L    Comment: (NOTE) Elevated high sensitivity troponin I (hsTnI) values and significant  changes across serial measurements may suggest ACS but many other  chronic and acute conditions are known to elevate hsTnI results.  Refer to the "Links" section for chest pain algorithms and additional  guidance. Performed at [facility], [address], [place], [place] [address]   Brain natriuretic peptide     Status: Abnormal   Collection Time: 01/12/21  1:45 AM  Result Value Ref Range   B Natriuretic Peptide 2,115.0 (H) 0.0 - 100.0 pg/mL    Comment: Performed at [facility], [address] [place], [place] [address]  Blood gas, arterial (at [facility])     Status: Abnormal   Collection Time: 01/12/21  2:25 AM  Result Value Ref Range   FIO2 36.00    pH, Arterial 7.155 (LL) 7.350 - 7.450    Comment: CRITICAL RESULT CALLED TO, READ BACK BY AND VERIFIED WITH: PRUITT,G @ 0235 ON 01/12/21 BY JUW    pCO2 arterial 59.7 (H) 32.0 - 48.0 mmHg   pO2, Arterial 34.7 (LL) 83.0 - 108.0 mmHg    Comment: CRITICAL RESULT CALLED TO, READ BACK BY AND VERIFIED WITH: PRUITT,G @ 0235 ON 01/12/21 BY JUW    Bicarbonate 17.0 (L) 20.0 - 28.0 mmol/L   Acid-base deficit 7.3 (H) 0.0 - 2.0 mmol/L   O2 Saturation 54.3 %   Patient temperature 37.0    Allens test (pass/fail) NOT INDICATED (A) PASS    Comment: Performed at [facility], [address], [place], [place] [address]  Blood gas, arterial (at [facility])     Status: Abnormal   Collection Time: 01/12/21  3:03 AM  Result Value Ref Range   FIO2 36.00    pH, Arterial 7.303 (L) 7.350 - 7.450   pCO2 arterial 51.3 (H) 32.0 - 48.0 mmHg   pO2, Arterial 80.0 (L) 83.0 - 108.0 mmHg   Bicarbonate 23.1 20.0 - 28.0 mmol/L   Acid-base deficit 0.9 0.0 - 2.0 mmol/L   O2 Saturation  94.9 %   Patient temperature 37.0    Allens test (pass/fail) NOT INDICATED (A) PASS    Comment: Performed at [facility], [address] [place], [place] [address]  Lactic acid, plasma     Status: None   Collection Time: 01/12/21  3:47 AM  Result Value Ref  Range   Lactic Acid, Venous 1.5 0.5 - 1.9 mmol/L    Comment: Performed at [facility], [address] [place], [place] [address]  Blood culture (routine x 2)     Status: None (Preliminary result)   Collection Time: 01/12/21  3:47 AM   Specimen: BLOOD LEFT FOREARM  Result Value Ref Range   Specimen Description BLOOD LEFT FOREARM    Special Requests      BOTTLES DRAWN AEROBIC AND ANAEROBIC Blood Culture results may not be optimal due to an inadequate volume of blood received in culture bottles Performed at [facility], [address] [place], [place] [address]    Culture PENDING    Report Status PENDING   Troponin I (High Sensitivity)     Status: None   Collection Time: 01/12/21  3:47 AM  Result Value Ref Range   Troponin I (High Sensitivity) 14 <18 ng/L    Comment: (NOTE) Elevated high sensitivity troponin I (hsTnI) values and significant  changes across serial measurements may suggest ACS but many other  chronic and acute conditions are known to elevate hsTnI results.  Refer to the "Links" section for chest pain algorithms and additional  guidance. Performed at [facility], [address], [place], [place] [address]     Chemistries  Recent Labs  Lab 01/12/21 0144  NA 125*  K 4.5  CL 95*  CO2 22  GLUCOSE 113*  BUN 7*  CREATININE 0.55  CALCIUM 8.6*  AST 73*  ALT 34  ALKPHOS 92  BILITOT 1.4*   ------------------------------------------------------------------------------------------------------------------  ------------------------------------------------------------------------------------------------------------------ GFR: Estimated Creatinine Clearance: 64.4 mL/min (by C-G formula based on SCr of 0.55  mg/dL). Liver Function Tests: Recent Labs  Lab 01/12/21 0144  AST 73*  ALT 34  ALKPHOS 92  BILITOT 1.4*  PROT 7.6  ALBUMIN 4.0   No results for input(s): LIPASE, AMYLASE in the last 168 hours. No results for input(s): AMMONIA in the last 168 hours. Coagulation Profile: No results for input(s): INR, PROTIME in the last 168 hours. Cardiac Enzymes: No results for input(s): CKTOTAL, CKMB, CKMBINDEX, TROPONINI in the last 168 hours. BNP (last 3 results) No results for input(s): PROBNP in the last 8760 hours. HbA1C: No results for input(s): HGBA1C in the last 72 hours. CBG: No results for input(s): GLUCAP in the last 168 hours. Lipid Profile: No results for input(s): CHOL, HDL, LDLCALC, TRIG, CHOLHDL, LDLDIRECT in the last 72 hours. Thyroid Function Tests: No results for input(s): TSH, T4TOTAL, FREET4, T3FREE, THYROIDAB in the last 72 hours. Anemia Panel: No results for input(s): VITAMINB12, FOLATE, FERRITIN, TIBC, IRON, RETICCTPCT in the last 72 hours.  --------------------------------------------------------------------------------------------------------------- Urine analysis:    Component Value Date/Time   COLORURINE DARK YELLOW 01/16/2018 1553   APPEARANCEUR CLEAR 01/16/2018 1553   LABSPEC 1.020 01/16/2018 1553   PHURINE 8.0 01/16/2018 1553   GLUCOSEU TRACE (A) 01/16/2018 1553   HGBUR NEGATIVE 01/16/2018 1553   BILIRUBINUR small 09/15/2017 1210   KETONESUR NEGATIVE 01/16/2018 1553   PROTEINUR TRACE (A) 01/16/2018 1553   UROBILINOGEN 0.2 09/15/2017 1210   NITRITE NEGATIVE 01/16/2018 1553   LEUKOCYTESUR TRACE (A) 01/16/2018 1553      Imaging Results:    CT Angio Chest PE W and/or Wo Contrast  Result Date: 01/12/2021 CLINICAL DATA:  Shortness of breath for 1 hour, stage IV lung cancer EXAM: CT ANGIOGRAPHY CHEST WITH CONTRAST TECHNIQUE: Multidetector CT imaging of the chest was performed using the standard protocol during bolus administration of intravenous contrast.  Multiplanar CT image  reconstructions and MIPs were obtained to evaluate the vascular anatomy. CONTRAST:  92mL OMNIPAQUE IOHEXOL 350 MG/ML SOLN COMPARISON:  Outside CT chest 12/09/2020 (report only, images unavailable) outside CT chest 09/24/2020 FINDINGS: Cardiovascular: Satisfactory opacification the pulmonary arteries to the segmental level. No pulmonary artery filling defects are identified. Central pulmonary arteries are normal caliber. Borderline cardiomegaly with biatrial enlargement. Calcifications of the mitral annulus. Coronary artery calcifications are present as well. No pericardial effusion. Atherosclerotic plaque within the normal caliber aorta. No acute luminal abnormality of the imaged aorta. No periaortic stranding or hemorrhage. Normal 3 vessel branching of the aortic arch. Proximal great vessels are mildly calcified but otherwise unremarkable and free of acute abnormality. No major venous abnormalities. Right IJ approach Port-A-Cath tip terminates in the lower SVC. Mediastinum/Nodes: No pathologically enlarged or enlarging mediastinal, hilar or axillary adenopathy. No mediastinal fluid or gas. Normal thyroid gland and thoracic inlet. No acute abnormality of the trachea or esophagus. No worrisome mediastinal, hilar or axillary adenopathy. Lungs/Pleura: Diffuse pulmonary vascular redistribution, peribronchovascular thickening, and both interlobular septal and fissural thickening throughout both lungs with bilateral effusions favoring pulmonary edema. There are additional areas of mixed patchy ground-glass and consolidative opacity in the lungs which could reflect developing alveolar edema though an acute infectious or inflammatory process is difficult to fully exclude particularly given the anti dependent areas ground-glass in both upper lobes. Stable post treatment related changes are seen in the right lung base. Scattered micro nodules previously seen in the lungs are poorly visualized due to  extensive respiratory motion artifact. Some peripheral ground-glass opacity seen in the left upper lobe is grossly unchanged though difficult to fully assess given additional lung parenchyma findings. Upper Abdomen: No acute abnormalities present in the visualized portions of the upper abdomen. Musculoskeletal: Multilevel degenerative changes are present in the imaged portions of the spine. No acute osseous abnormality or suspicious osseous lesion. Multiple remote left-sided rib fractures are again seen. No acute osseous abnormalities. No suspicious lytic or blastic lesions. No worrisome chest wall masses or lesions. Mild circumferential body wall edema. Right IJ approach Port-A-Cath. Review of the MIP images confirms the above findings. IMPRESSION: 1. No evidence of acute pulmonary artery embolism. 2. Diffuse pulmonary vascular redistribution, peribronchovascular thickening, and bilateral effusions favoring pulmonary edema. Additional areas of mixed patchy ground-glass and consolidative opacity in the lungs could reflect developing alveolar edema though an acute infectious or inflammatory process is difficult to fully exclude particularly given the anti dependent areas ground-glass in both upper lobes. 3. Stable post treatment related changes in the right lung base. 4. Scattered micro nodules previously seen in the lungs are poorly visualized due to extensive respiratory motion artifact. Accurate staging of patient's malignancy is difficult to perform accurately given extensive respiratory motion artifact, underlying parenchymal processes above, and the absence of images from the patient's most recent comparison exam dated 12/09/2020. 5. Aortic Atherosclerosis (ICD10-I70.0). Electronically Signed   By: Lovena Le M.D.   On: 01/12/2021 04:29   DG Chest Portable 1 View  Result Date: 01/12/2021 CLINICAL DATA:  Shortness of breath EXAM: PORTABLE CHEST 1 VIEW COMPARISON:  11/11/2020 FINDINGS: Right Port-A-Cath  remains in place, unchanged. Heart is normal size. Diffuse interstitial prominence throughout the lungs, increasing since prior study. Left basilar airspace opacity. No effusions or acute bony abnormality. Old healed left 6th rib fracture. IMPRESSION: Diffuse interstitial prominence throughout the lungs. Focal left lower lobe airspace opacity. Findings could reflect pneumonia. Electronically Signed   By: Rolm Baptise M.D.   On:  01/12/2021 02:23    My personal review of EKG: Rhythm NSR, Rate 82 /min, QTc 518    Assessment & Plan:    Principal Problem:   Acute respiratory failure with hypoxia and hypercapnia (HCC) Active Problems:   Tobacco abuse   Anxiety   Community acquired pneumonia   Hyponatremia   Alcohol dependence ([place])   1. Acute respiratory failure with hypoxia and hypercapnia 1. Initial ABG: CO2 59, O2 34.7 2. Requiring 4 L [place] 3. Admit to Step down for PRN BiPAP 4. 2/2 to new onset CHF, possibly COPD contributing 5. Continue steroids, duoneb 6. Continue treatment as below 7. Continue to monitor 2. Acute on chronic CHF 1. Orthopnea and respiratory failure 2. BNP 2,115 3. Continue lasix 4. Echo in the AM 5. Daily weights 6. Monitor Is and Os 7. Trop 11, 14 8. Monitor on tele 3. CAP 1. As seen on CT 2. Continue broad spectrum antibiotics 3. Cultures pending 4. Acute on chronic hyponatremia 1. Baseline 129ish 2. Today 125 3. Hypervolemic hyponatremia 4. Likely to improve with CHF treatment above 5. Prolonged QT 1. QT 518 2. Monitor on tele 3. Avoid QT prolonging agents when possible 4. Continue to monitor electrolytes and treat as indicated 6. Leukocytosis 1. WBC 45.5 2. Nulesta last given 2 weeks ago 3. Possible pneumonia on CXR  4. Blood cultures, urine culture penidng 5. Continue broad spectrum antbiotics until further data is collected 6. Also stress reaction contributing after panic attack 7. Continue to monitor 7. Panic attack/Anxiety 1. Continue  home dose of ativan 8. Tobacco use disorder 1. Smokes 10 ciggs per day 2. Counseled on importance of cessation 3. Nicotine patch ordered 9. EtOH dependence  1. 2-3 drinks per day as well as a mixed drink 2. CIWA protocol 10. PAF 1. Continue eliquis and metoprolol   DVT Prophylaxis-   Eliquis - SCDs   AM Labs Ordered, also please review Full Orders  Family Communication: No family at bedside Code Status: Full  Admission status: Inpatient :The appropriate admission status for this patient is INPATIENT. Inpatient status is judged to be reasonable and necessary in order to provide the required intensity of service to ensure the patient's safety. The patient's presenting symptoms, physical exam findings, and initial radiographic and laboratory data in the context of their chronic comorbidities is felt to place them at high risk for further clinical deterioration. Furthermore, it is not anticipated that the patient will be medically stable for discharge from the hospital within 2 midnights of admission. The following factors support the admission status of inpatient.     The patient's presenting symptoms include dyspnea The worrisome physical exam findings include hypoxia The initial radiographic and laboratory data are worrisome because of pneumonia and fluid overload The chronic co-morbidities include lung cancer, paroxysmal atrial fibrillation, tobacco use disorder, alcohol use disorder       * I certify that at the point of admission it is my clinical judgment that the patient will require inpatient hospital care spanning beyond 2 midnights from the point of admission due to high intensity of service, high risk for further deterioration and high frequency of surveillance required.*  Time spent in minutes : Mackey

## 2021-01-12 NOTE — ED Provider Notes (Signed)
[facility] EMERGENCY DEPARTMENT Provider Note   CSN: [id number] Arrival date & time: 01/12/21  0132     History Chief Complaint  Patient presents with  . Shortness of Breath    Cynthia Kelley is a 65 y.o. female.  Level 5 caveat for respiratory distress.  Patient with history of metastatic lung cancer, diastolic heart failure, COPD presenting with difficulty breathing that woke her from sleep about 1 hour ago.  States she felt well when she went to bed.  Woke up gasping for breath and could not get a breath in.  EMS placed her on 4 L nasal cannula for sats in the 80s.  She denies any recent cough or fever.  She denies any chest pain, leg pain or leg swelling.  No abdominal pain, nausea or vomiting.  Denies any cardiac history.  Last received chemotherapy about a week ago by her report.  She has never had this breathing difficulty before.  States compliance with her medications including Eliquis.  She does not think she is on Lasix any longer.  The history is provided by the patient and the EMS personnel. The history is limited by the condition of the patient.  Shortness of Breath Associated symptoms: no abdominal pain, no chest pain, no cough, no fever, no headaches, no rash and no vomiting        Past Medical History:  Diagnosis Date  . Acute diastolic congestive heart failure, NYHA class 2 ([place]) 11/11/2020  . Anemia 11/22/2017  . Anxiety 11/19/2020  . Arthritis   . Asymptomatic PVD (peripheral vascular disease) ([place]) 12/14/2016   Suspected.  Hands hyperemic with decreased cap refill. ? History vasospasm  . FRACTURE, TIBIAL PLATEAU 05/11/2010   Qualifier: Diagnosis of  By: Aline Brochure MD, Dorothyann Peng    . Lung cancer, primary, with metastasis from lung to other site [facility]) 01/16/2018  . Nuclear sclerotic cataract of both eyes 06/08/2018  . Osteopenia 05/15/2013  . Osteoporosis    osteopenia  . Tobacco abuse 12/14/2016    Patient Active Problem List   Diagnosis Date Noted  . Encounter to  establish care 11/19/2020  . Anxiety 11/19/2020  . Diastolic congestive heart failure, NYHA class 2 ([place]) 11/11/2020  . Typical atrial flutter ([place]) 11/11/2020  . Oropharyngeal dysphagia 08/13/2020  . Screening for colorectal cancer 07/19/2018  . Dry eye syndrome of both eyes 06/08/2018  . Nuclear sclerotic cataract of both eyes 06/08/2018  . Posterior vitreous detachment of both eyes 06/08/2018  . Lung cancer, primary, with metastasis from lung to other site [facility]) 01/16/2018  . Osseous metastasis ([place]) 11/10/2017  . Metastatic squamous cell carcinoma ([place]) 10/20/2017  . Tobacco abuse 12/14/2016  . Osteopenia 05/15/2013  . KNEE, ARTHRITIS, DEGEN./OSTEO 05/11/2010    Past Surgical History:  Procedure Laterality Date  . FRACTURE SURGERY     torn ACL  . KNEE ARTHROSCOPY       OB History    Gravida  1   Para  1   Term      Preterm      AB      Living  1     SAB      IAB      Ectopic      Multiple      Live Births  1           Family History  Problem Relation Age of Onset  . Cancer Father        died in 46's everywhere  .  Heart disease Mother   . Cancer Mother        lung  . Stroke Mother   . Asthma Daughter     Social History   Tobacco Use  . Smoking status: Current Every Day Smoker    Packs/day: 0.50    Years: 20.00    Pack years: 10.00    Types: Cigarettes    Start date: 09/28/1963  . Smokeless tobacco: Never Used  Vaping Use  . Vaping Use: Never used  Substance Use Topics  . Alcohol use: Yes    Alcohol/week: 20.0 standard drinks    Types: 20 Cans of beer per week    Comment: 3-4 beers a day  . Drug use: No    Home Medications Prior to Admission medications   Medication Sig Start Date End Date Taking? Authorizing Provider  apixaban (ELIQUIS) 5 MG TABS tablet Take by mouth. 09/27/20   [provider]  calcium carbonate (OS-CAL - DOSED IN MG OF ELEMENTAL CALCIUM) 1250 (500 Ca) MG tablet Take 1 tablet by mouth 3 (three) times  daily with meals.    [provider]  furosemide (LASIX) 20 MG tablet Take 20 mg by mouth daily as needed for fluid or edema.    [provider]  LORazepam (ATIVAN) 1 MG tablet Take 1 mg by mouth every 8 (eight) hours.    [provider]  metoprolol succinate (TOPROL-XL) 100 MG 24 hr tablet Take by mouth. 09/28/20   [provider]  Multiple Vitamins-Minerals (MULTIVITAMIN WITH MINERALS) tablet Take 1 tablet by mouth daily.    [provider]  potassium chloride SA (K-DUR,KLOR-CON) 20 MEQ tablet Take 20 mEq by mouth 2 (two) times daily.    [provider]    Allergies    Patient has no known allergies.  Review of Systems   Review of Systems  Constitutional: Negative for appetite change and fever.  HENT: Negative for congestion.   Respiratory: Positive for shortness of breath. Negative for cough and chest tightness.   Cardiovascular: Negative for chest pain.  Gastrointestinal: Negative for abdominal pain, nausea and vomiting.  Genitourinary: Negative for dysuria and hematuria.  Musculoskeletal: Negative for arthralgias and myalgias.  Skin: Negative for rash.  Neurological: Negative for dizziness and headaches.   all other systems are negative except as noted in the HPI and PMH.   Physical Exam Updated Vital Signs BP (!) 188/89 (BP Location: Right Arm)   Pulse 81   Temp 98.4 F (36.9 C) (Oral)   Resp (!) 26   Ht 5\' 6"  (1.676 m)   Wt 59 kg   SpO2 97%   BMI 20.98 kg/m   Physical Exam Vitals and nursing note reviewed.  Constitutional:      General: She is in acute distress.     Appearance: She is well-developed.     Comments: Tripoding, respiratory distress with tachypnea speaking short phrases  HENT:     Head: Normocephalic and atraumatic.     Mouth/Throat:     Pharynx: No oropharyngeal exudate.  Eyes:     Conjunctiva/sclera: Conjunctivae normal.     Pupils: Pupils are equal, round, and reactive to light.  Neck:      Comments: No meningismus. Cardiovascular:     Rate and Rhythm: Normal rate and regular rhythm.     Heart sounds: Normal heart sounds. No murmur heard.   Pulmonary:     Effort: Respiratory distress present.     Breath sounds: Wheezing and rales present.  Comments: Diminished throughout with scattered rhonchi and wheezing. Abdominal:     Palpations: Abdomen is soft.     Tenderness: There is no abdominal tenderness. There is no guarding or rebound.  Musculoskeletal:        General: No tenderness. Normal range of motion.     Cervical back: Normal range of motion and neck supple.     Right lower leg: No edema.     Left lower leg: No edema.  Skin:    General: Skin is warm.  Neurological:     Mental Status: She is alert and oriented to person, place, and time.     Cranial Nerves: No cranial nerve deficit.     Motor: No abnormal muscle tone.     Coordination: Coordination normal.     Comments:  5/5 strength throughout. CN 2-12 intact.Equal grip strength.   Psychiatric:        Behavior: Behavior normal.     ED Results / Procedures / Treatments   Labs (all labs ordered are listed, but only abnormal results are displayed) Labs Reviewed  CBC WITH DIFFERENTIAL/PLATELET - Abnormal; Notable for the following components:      Result Value   WBC 45.5 (*)    RBC 3.19 (*)    HCT 35.5 (*)    MCV 111.3 (*)    MCH 37.6 (*)    RDW 18.9 (*)    Neutro Abs 41.9 (*)    Monocytes Absolute 1.4 (*)    All other components within normal limits  COMPREHENSIVE METABOLIC PANEL - Abnormal; Notable for the following components:   Sodium 125 (*)    Chloride 95 (*)    Glucose, Bld 113 (*)    BUN 7 (*)    Calcium 8.6 (*)    AST 73 (*)    Total Bilirubin 1.4 (*)    All other components within normal limits  BRAIN NATRIURETIC PEPTIDE - Abnormal; Notable for the following components:   B Natriuretic Peptide 2,115.0 (*)    All other components within normal limits  BLOOD GAS, ARTERIAL - Abnormal;  Notable for the following components:   pH, Arterial 7.155 (*)    pCO2 arterial 59.7 (*)    pO2, Arterial 34.7 (*)    Bicarbonate 17.0 (*)    Acid-base deficit 7.3 (*)    Allens test (pass/fail) NOT INDICATED (*)    All other components within normal limits  BLOOD GAS, ARTERIAL - Abnormal; Notable for the following components:   pH, Arterial 7.303 (*)    pCO2 arterial 51.3 (*)    pO2, Arterial 80.0 (*)    Allens test (pass/fail) NOT INDICATED (*)    All other components within normal limits  CULTURE, BLOOD (ROUTINE X 2)  CULTURE, BLOOD (ROUTINE X 2)  RESP PANEL BY RT-PCR (FLU A&B, COVID) ARPGX2  LACTIC ACID, PLASMA  LACTIC ACID, PLASMA  TROPONIN I (HIGH SENSITIVITY)  TROPONIN I (HIGH SENSITIVITY)    EKG EKG Interpretation  Date/Time:  Monday January 12 2021 01:45:45 EDT Ventricular Rate:  82 PR Interval:  223 QRS Duration: 123 QT Interval:  443 QTC Calculation: 518 R Axis:   85 Text Interpretation: Sinus rhythm Prolonged PR interval Left atrial enlargement Left bundle branch block Baseline wander in lead(s) V1 V2 No previous ECGs available Confirmed by Ezequiel Essex [phone]) on 01/12/2021 3:25:22 AM   Radiology CT Angio Chest PE W and/or Wo Contrast  Result Date: 01/12/2021 CLINICAL DATA:  Shortness of breath for 1 hour, stage IV  lung cancer EXAM: CT ANGIOGRAPHY CHEST WITH CONTRAST TECHNIQUE: Multidetector CT imaging of the chest was performed using the standard protocol during bolus administration of intravenous contrast. Multiplanar CT image reconstructions and MIPs were obtained to evaluate the vascular anatomy. CONTRAST:  40mL OMNIPAQUE IOHEXOL 350 MG/ML SOLN COMPARISON:  Outside CT chest 12/09/2020 (report only, images unavailable) outside CT chest 09/24/2020 FINDINGS: Cardiovascular: Satisfactory opacification the pulmonary arteries to the segmental level. No pulmonary artery filling defects are identified. Central pulmonary arteries are normal caliber. Borderline  cardiomegaly with biatrial enlargement. Calcifications of the mitral annulus. Coronary artery calcifications are present as well. No pericardial effusion. Atherosclerotic plaque within the normal caliber aorta. No acute luminal abnormality of the imaged aorta. No periaortic stranding or hemorrhage. Normal 3 vessel branching of the aortic arch. Proximal great vessels are mildly calcified but otherwise unremarkable and free of acute abnormality. No major venous abnormalities. Right IJ approach Port-A-Cath tip terminates in the lower SVC. Mediastinum/Nodes: No pathologically enlarged or enlarging mediastinal, hilar or axillary adenopathy. No mediastinal fluid or gas. Normal thyroid gland and thoracic inlet. No acute abnormality of the trachea or esophagus. No worrisome mediastinal, hilar or axillary adenopathy. Lungs/Pleura: Diffuse pulmonary vascular redistribution, peribronchovascular thickening, and both interlobular septal and fissural thickening throughout both lungs with bilateral effusions favoring pulmonary edema. There are additional areas of mixed patchy ground-glass and consolidative opacity in the lungs which could reflect developing alveolar edema though an acute infectious or inflammatory process is difficult to fully exclude particularly given the anti dependent areas ground-glass in both upper lobes. Stable post treatment related changes are seen in the right lung base. Scattered micro nodules previously seen in the lungs are poorly visualized due to extensive respiratory motion artifact. Some peripheral ground-glass opacity seen in the left upper lobe is grossly unchanged though difficult to fully assess given additional lung parenchyma findings. Upper Abdomen: No acute abnormalities present in the visualized portions of the upper abdomen. Musculoskeletal: Multilevel degenerative changes are present in the imaged portions of the spine. No acute osseous abnormality or suspicious osseous lesion. Multiple  remote left-sided rib fractures are again seen. No acute osseous abnormalities. No suspicious lytic or blastic lesions. No worrisome chest wall masses or lesions. Mild circumferential body wall edema. Right IJ approach Port-A-Cath. Review of the MIP images confirms the above findings. IMPRESSION: 1. No evidence of acute pulmonary artery embolism. 2. Diffuse pulmonary vascular redistribution, peribronchovascular thickening, and bilateral effusions favoring pulmonary edema. Additional areas of mixed patchy ground-glass and consolidative opacity in the lungs could reflect developing alveolar edema though an acute infectious or inflammatory process is difficult to fully exclude particularly given the anti dependent areas ground-glass in both upper lobes. 3. Stable post treatment related changes in the right lung base. 4. Scattered micro nodules previously seen in the lungs are poorly visualized due to extensive respiratory motion artifact. Accurate staging of patient's malignancy is difficult to perform accurately given extensive respiratory motion artifact, underlying parenchymal processes above, and the absence of images from the patient's most recent comparison exam dated 12/09/2020. 5. Aortic Atherosclerosis (ICD10-I70.0). Electronically Signed   By: Lovena Le M.D.   On: 01/12/2021 04:29   DG Chest Portable 1 View  Result Date: 01/12/2021 CLINICAL DATA:  Shortness of breath EXAM: PORTABLE CHEST 1 VIEW COMPARISON:  11/11/2020 FINDINGS: Right Port-A-Cath remains in place, unchanged. Heart is normal size. Diffuse interstitial prominence throughout the lungs, increasing since prior study. Left basilar airspace opacity. No effusions or acute bony abnormality. Old healed left 6th rib  fracture. IMPRESSION: Diffuse interstitial prominence throughout the lungs. Focal left lower lobe airspace opacity. Findings could reflect pneumonia. Electronically Signed   By: Rolm Baptise M.D.   On: 01/12/2021 02:23     Procedures .Critical Care Performed by: Ezequiel Essex, MD Authorized by: Ezequiel Essex, MD   Critical care provider statement:    Critical care time (minutes):  60   Critical care was necessary to treat or prevent imminent or life-threatening deterioration of the following conditions:  Respiratory failure and cardiac failure   Critical care was time spent personally by me on the following activities:  Discussions with consultants, evaluation of patient's response to treatment, examination of patient, ordering and performing treatments and interventions, ordering and review of laboratory studies, ordering and review of radiographic studies, pulse oximetry, re-evaluation of patient's condition, obtaining history from patient or surrogate and review of old charts     Medications Ordered in ED Medications  ipratropium-albuterol (DUONEB) 0.5-2.5 (3) MG/3ML nebulizer solution 3 mL (has no administration in time range)  methylPREDNISolone sodium succinate (SOLU-MEDROL) 125 mg/2 mL injection 125 mg (has no administration in time range)  magnesium sulfate IVPB 2 g 50 mL (has no administration in time range)    ED Course  I have reviewed the triage vital signs and the nursing notes.  Pertinent labs & imaging results that were available during my care of the patient were reviewed by me and considered in my medical decision making (see chart for details).    MDM Rules/Calculators/A&P                         Respiratory distress with history of lung cancer, COPD and CHF. No chest pain. Hypoxic at home.   Patient tripoding on arrival with diffuse crackles and expiratory wheezing.  She is placed on BiPAP, given nebulizers and steroids as well as IV Lasix.  EKG without acute ischemia.  X-ray shows diffuse interstitial prominence with left lower lobe airspace opacity.  Patient to be treated for healthcare associated pneumonia.  Labs show leukocytosis of 45 after receiving chemotherapy.  Suspect she may have gotten neulasta.  Initial ABG results were spurious and repeat shows no significant CO2 retention or respiratory acidosis. Acute on chronic hyponatremia of 125.  Breathing has improved after steroids, bronchodilators and magnesium and IV Lasix.  CT scan shows no pulmonary embolism but does show groundglass opacities throughout both lungs and pleural effusions consistent with pulmonary edema  Does not appear to have a history of heart failure.  No echocardiogram in system.  Patient underwent cardioversion for atrial fibrillation [facility] in March.  At that time her EF was normal. Also will start broad-spectrum antibiotics for possible component of pneumonia as well  Desaturates to 67% with ambulation. Recovers to mid 90s on 3L.   Will need admission for hypoxic respiratory failure likely secondary to new onset CHF and flash pulmonary edema. D/w Dr. Clearence Ped. Patient and husband updated at bedside.   Final Clinical Impression(s) / ED Diagnoses Final diagnoses:  Acute respiratory failure with hypoxia ([place])  Acute on chronic congestive heart failure, unspecified heart failure type [facility])    Rx / DC Orders ED Discharge Orders    None       Emidio Warrell, Annie Main, MD 01/12/21 [phone]

## 2021-01-12 NOTE — ED Triage Notes (Signed)
Pt c/o SOB for about the past hour. Pt also very anxious at this time. Pt has stage 4 lung CA that she is receiving treatment for once per week. Pt placed on 4L [place] by EMS.

## 2021-01-12 NOTE — ED Notes (Signed)
Patient transported to CT 

## 2021-01-12 NOTE — Progress Notes (Addendum)
Pharmacy Antibiotic Note  Cynthia Kelley is a 65 y.o. female admitted on 01/12/2021 with pneumonia.  Pharmacy has been consulted for Vancomycin dosing. Cefepime x 1 ordered in ED.  Plan: Vancomycin 1250 mg IV Q 24 hrs. Goal AUC 400-550. Expected AUC: 508 SCr used: 0.8 Will f/u renal function, micro data, and pt's clinical condition Vanc levels prn F/u gram negative coverage upon admission  Addendum (0630) MD adding Cefepime Cefepime 2gm IV q8h.    Height: 5\' 6"  (167.6 cm) Weight: 59 kg (130 lb) IBW/kg (Calculated) : 59.3  Temp (24hrs), Avg:98.4 F (36.9 C), Min:98.4 F (36.9 C), Max:98.4 F (36.9 C)  Recent Labs  Lab 01/12/21 0144  WBC 45.5*  CREATININE 0.55    Estimated Creatinine Clearance: 64.4 mL/min (by C-G formula based on SCr of 0.55 mg/dL).    No Known Allergies  Antimicrobials this admission: 4/18 Vanc >>  4/18 Cefepime >>  Microbiology results: 4/18 BCx:  Thank you for allowing pharmacy to be a part of this patient's care.  Sherlon Handing, PharmD, BCPS Please see amion for complete clinical pharmacist phone list 01/12/2021 2:40 AM

## 2021-01-12 NOTE — Progress Notes (Signed)
Patient arrived to ICU via wheelchair. Patient ambulated from wheelchair to bed. Then ambulated from bed to toilet and back to bed. On Room air. Oxygen saturation maintained in the lower 90s. Currently patient is on room air and with no activity, she is at 94%.

## 2021-01-12 NOTE — ED Notes (Signed)
Pt's O2 sats dropped to 67% during ambulation. Dr. Wyvonnia Dusky notified.

## 2021-01-12 NOTE — ED Notes (Signed)
Blood gas repeated results arterial

## 2021-01-12 NOTE — ED Notes (Signed)
Date and time results received: 01/12/21 0237 (use smartphrase ".now" to insert current time)  Test:ABG Critical Value: pH 7.155, pO2 34.7 Name of Provider Notified: [place] Orders Received? None Or Actions Taken?: None

## 2021-01-13 DIAGNOSIS — F419 Anxiety disorder, unspecified: Secondary | ICD-10-CM

## 2021-01-13 DIAGNOSIS — F10230 Alcohol dependence with withdrawal, uncomplicated: Secondary | ICD-10-CM

## 2021-01-13 DIAGNOSIS — Z720 Tobacco use: Secondary | ICD-10-CM

## 2021-01-13 DIAGNOSIS — E871 Hypo-osmolality and hyponatremia: Secondary | ICD-10-CM

## 2021-01-13 LAB — COMPREHENSIVE METABOLIC PANEL
ALT: 31 U/L (ref 0–44)
AST: 34 U/L (ref 15–41)
Albumin: 3.9 g/dL (ref 3.5–5.0)
Alkaline Phosphatase: 118 U/L (ref 38–126)
Anion gap: 13 (ref 5–15)
BUN: 12 mg/dL (ref 8–23)
CO2: 22 mmol/L (ref 22–32)
Calcium: 8.9 mg/dL (ref 8.9–10.3)
Chloride: 93 mmol/L — ABNORMAL LOW (ref 98–111)
Creatinine, Ser: 0.66 mg/dL (ref 0.44–1.00)
GFR, Estimated: >60 mL/min (ref >60)
Glucose, Bld: 127 mg/dL — ABNORMAL HIGH (ref 70–99)
Potassium: 3.6 mmol/L (ref 3.5–5.1)
Sodium: 128 mmol/L — ABNORMAL LOW (ref 135–145)
Total Bilirubin: 0.7 mg/dL (ref 0.3–1.2)
Total Protein: 7.3 g/dL (ref 6.5–8.1)

## 2021-01-13 LAB — CBC WITH DIFFERENTIAL/PLATELET
Band Neutrophils: 26 %
Basophils Absolute: 0 10*3/uL (ref 0.0–0.1)
Basophils Relative: 0 %
Eosinophils Absolute: 0 10*3/uL (ref 0.0–0.5)
Eosinophils Relative: 0 %
HCT: 32.7 % — ABNORMAL LOW (ref 36.0–46.0)
Hemoglobin: 11 g/dL — ABNORMAL LOW (ref 12.0–15.0)
Lymphocytes Relative: 4 %
Lymphs Abs: 2.1 10*3/uL (ref 0.7–4.0)
MCH: 37.8 pg — ABNORMAL HIGH (ref 26.0–34.0)
MCHC: 33.6 g/dL (ref 30.0–36.0)
MCV: 112.4 fL — ABNORMAL HIGH (ref 80.0–100.0)
Metamyelocytes Relative: 1 %
Monocytes Absolute: 0 10*3/uL — ABNORMAL LOW (ref 0.1–1.0)
Monocytes Relative: 0 %
Neutro Abs: 50.4 10*3/uL — ABNORMAL HIGH (ref 1.7–7.7)
Neutrophils Relative %: 69 %
Platelets: 162 10*3/uL (ref 150–400)
RBC: 2.91 MIL/uL — ABNORMAL LOW (ref 3.87–5.11)
RDW: 19.1 % — ABNORMAL HIGH (ref 11.5–15.5)
WBC: 53 10*3/uL (ref 4.0–10.5)
nRBC: 0 % (ref 0.0–0.2)

## 2021-01-13 LAB — MAGNESIUM: Magnesium: 2 mg/dL (ref 1.7–2.4)

## 2021-01-13 MED ORDER — CHLORDIAZEPOXIDE HCL 5 MG PO CAPS
10.0000 mg | ORAL_CAPSULE | Freq: Three times a day (TID) | ORAL | Status: DC
  Administered 2021-01-13 – 2021-01-14 (×3): 10 mg via ORAL
  Filled 2021-01-13 (×3): qty 2

## 2021-01-13 MED ORDER — DEXMEDETOMIDINE HCL IN NACL 400 MCG/100ML IV SOLN
0.4000 ug/kg/h | INTRAVENOUS | Status: DC
  Administered 2021-01-13: 0.4 ug/kg/h via INTRAVENOUS
  Administered 2021-01-13: 0.6 ug/kg/h via INTRAVENOUS
  Administered 2021-01-14: 1.2 ug/kg/h via INTRAVENOUS
  Administered 2021-01-14: 1.2095 ug/kg/h via INTRAVENOUS
  Filled 2021-01-13 (×4): qty 100

## 2021-01-13 NOTE — Progress Notes (Signed)
PROGRESS NOTE    Cynthia Kelley  WJX:[id number] DOB: [birth year] DOA: 01/12/2021 PCP: Lindell Spar, MD   Chief Complaint  Patient presents with  . Shortness of Breath    Brief admission narrative:  As per H&P written by Dr. Clearence Ped on 01/12/21 Cynthia Kelley  is a 65 y.o. female, with history of tobacco abuse, lung cancer, peripheral vascular disease, anxiety, acute diastolic congestive heart failure, and more presents the ED with a chief complaint of dyspnea.  Patient reports that in the middle of the night she was woke up from sleep by acute dyspnea.  She reports that this triggered a panic attack and she does not know if there was anything that made the dyspnea better or worse.  She remembers palpitations, but denies chest pain.  Patient reports that everything is not blurred because she was panicking.  She does report that prior to going to bed she was in her normal state of health aside from some sinus congestion.  She does have lung cancer for which she is getting chemo.  Her last Neulasta treatment was 2 weeks ago.  Patient denies any fevers.  She denies any weight gain or leg swelling over the last few days.  She does report that she had difficulty with peripheral edema several months ago when she was started on as needed Lasix at that time.  Patient does not recall having a history of congestive heart failure but it is in her chart from November 11, 2020.  There is no echo in our system.  Patient is still currently smoking 10 cigarettes/day.  She drinks alcohol 2-3 beers per day and a mixed drink.  She does not use illicit drugs.  Patient is vaccinated for COVID.  Patient is full code.  In the ED when patient he mentioned in respiratory distress and tripoding.  Initially were considering putting her on BiPAP, but after he being given a dose of Ativan patient calmed down and her oxygen saturations were able to be maintained on 4 L nasal cannula and then weaned down to 2 L nasal cannula upon my  exam.  Patient was afebrile, heart rate was 74-81, respiratory rate was 19-26, blood pressure stable at 147/69, satting at 100% on 4 L.  She was satting at 95% on 2 L upon my exam.  Patient had initial ABG that was concerning with a pH of 7.155, CO2 59.7, O2 34.7.  Repeat ABG showed a pH of 7.3, CO2 51.3, O2 80.  Patient is on Eliquis for paroxysmal A.  Fibrillation, but given the acute onset shortness of breath CTA was done to rule out PE.  It showed diffuse vascular redistribution.  Peribronchovascular thickening and bilateral effusions favoring pulmonary edema.  Additional areas of mixed patchy groundglass and consolidative opacity in the lungs could reflect developing alveolar edema though an acute infectious or inflammatory process is difficult to fully exclude.  Chest x-ray was read as focal left lower lobe opacity.    Assessment & Plan: 1-Acute respiratory failure with hypoxia: Multifactorial in the setting of COPD exacerbation, bronchiectasis/left lower lobe pneumonia and acute on chronic diastolic heart failure. -Continue IV Lasix, low-sodium diet and is strict intake and output follow-up -Will also continue bronchodilator management, steroids, nebulizer, mucolytic's and antibiotics. -Patient oxygenation has improved and is currently afebrile -No requiring oxygen supplementation at this moment. -Repeat chest x-ray in a.m. -Follow clinical response. -Vancomycin has been discontinued in the setting of negative MRSA PCR.  2-alcohol abuse with withdrawal -Patient and requiring  initiation of Precedex overnight -Started on Librium and will wean off Precedex as tolerated -Continue to follow CIWA protocol -Continue thiamine and folic acid -Cessation counseling has been provided.  3-Tobacco abuse -Cessation counseling provided -Continue nicotine patch  4-Anxiety -Currently receiving benzodiazepine -Will follow mood stability.  5-paroxysmal atrial fibrillation -Continue the use of  metoprolol and Eliquis. -Rate stable currently.  6-prolonged QT -Continue to monitor on telemetry and follow electrolytes -Continue to avoid as much as possible medications that can further prolong QT segment.  7-leukocytosis -Continue to follow trend -Patient received Neulasta 2 weeks ago -Acute infection also contributing.  8-acute on chronic hyponatremia -Patient baseline around 130s -Also low chloride appreciated -Appears to be in the setting of chronic alcohol abuse, mild dehydration and hypovolemia with CHF exacerbation -Continue to follow electrolytes trend.  9-acute urinary retention -Status post In-N-Out cath x1 -Will follow patient's ability to void on her own.   DVT prophylaxis: Chronically on Eliquis. Code Status: Full code. Family Communication: Husband at bedside. Disposition:   Status is: Inpatient   Dispo: The patient is from: Home              Anticipated d/c is to: Home              Patient currently is not medically stable; experiencing symptoms of alcohol withdrawal, still with crackles on examination and expiratory wheezing.  Positive tachypnea.  Continue treatment with diuresis, and COPD management.  Requiring Precedex.   Difficult to place patient :    Consultants:   None   Procedures:  See below for x-ray reports.  Antimicrobials:  Cefepime 01/12/21 Vancomycin 01/12/21>>01/13/21  Subjective: Increase agitation overnight in the setting of alcohol withdrawal; Precedex required.  Still with ongoing wheezing and crackles on examination.  No chest pain, no nausea, no vomiting, no oxygen requirement.  Patient is afebrile.  Good urine output reported.  Objective: Vitals:   01/13/21 1000 01/13/21 1100 01/13/21 1119 01/13/21 1200  BP: (!) 149/70 (!) 126/49  (!) 125/49  Pulse: 86 78 71 73  Resp: (!) 26 16 15  (!) 21  Temp:   98.2 F (36.8 C)   TempSrc:   Axillary   SpO2: 92% 93% 95% 93%  Weight:      Height:        Intake/Output Summary  (Last 24 hours) at 01/13/2021 1226 Last data filed at 01/13/2021 1000 Gross per 24 hour  Intake 331.25 ml  Output 1600 ml  Net -1268.75 ml   Filed Weights   01/12/21 0138 01/12/21 0756 01/13/21 0500  Weight: 59 kg 54.9 kg 54.7 kg    Examination:  General exam: Afebrile currently; sleepy while receiving Precedex.  No chest pain, no nausea, no vomiting.  Reports improvement in her breathing. Respiratory system: Fine crackles at the bases, positive expiratory wheezing and bilateral rhonchi.  No using accessory muscles. Cardiovascular system: S1 & S2 appreciated; irregular rhythm.  No rubs, no gallops, no JVD on exam. Gastrointestinal system: Abdomen is nondistended, soft and nontender. No organomegaly or masses felt. Normal bowel sounds heard. Central nervous system: No focal neurological deficits. Extremities: Cyanosis or clubbing. Skin: No petechiae. Psychiatry: Unable to properly assess in the setting of acute withdrawal symptoms.  Data Reviewed: I have personally reviewed following labs and imaging studies  CBC: Recent Labs  Lab 01/12/21 0144 01/13/21 0403  WBC 45.5* 53.0*  NEUTROABS 41.9* 50.4*  HGB 12.0 11.0*  HCT 35.5* 32.7*  MCV 111.3* 112.4*  PLT 219 162    Basic  Metabolic Panel: Recent Labs  Lab 01/12/21 0144 01/13/21 0403  NA 125* 128*  K 4.5 3.6  CL 95* 93*  CO2 22 22  GLUCOSE 113* 127*  BUN 7* 12  CREATININE 0.55 0.66  CALCIUM 8.6* 8.9  MG  --  2.0    GFR: Estimated Creatinine Clearance: 59.7 mL/min (by C-G formula based on SCr of 0.66 mg/dL).  Liver Function Tests: Recent Labs  Lab 01/12/21 0144 01/13/21 0403  AST 73* 34  ALT 34 31  ALKPHOS 92 118  BILITOT 1.4* 0.7  PROT 7.6 7.3  ALBUMIN 4.0 3.9    CBG: No results for input(s): GLUCAP in the last 168 hours.   Recent Results (from the past 240 hour(s))  Blood culture (routine x 2)     Status: None (Preliminary result)   Collection Time: 01/12/21  3:47 AM   Specimen: Left  Antecubital; Blood  Result Value Ref Range Status   Specimen Description LEFT ANTECUBITAL  Final   Special Requests   Final    BOTTLES DRAWN AEROBIC AND ANAEROBIC Blood Culture adequate volume   Culture   Final    NO GROWTH 1 DAY Performed at [facility], [address], [place], [place] [address]    Report Status PENDING  Incomplete  Blood culture (routine x 2)     Status: None (Preliminary result)   Collection Time: 01/12/21  3:47 AM   Specimen: BLOOD LEFT FOREARM  Result Value Ref Range Status   Specimen Description BLOOD LEFT FOREARM  Final   Special Requests   Final    BOTTLES DRAWN AEROBIC AND ANAEROBIC Blood Culture results may not be optimal due to an inadequate volume of blood received in culture bottles   Culture   Final    NO GROWTH 1 DAY Performed at [facility], [address] [place], [place] [address]    Report Status PENDING  Incomplete  Resp Panel by RT-PCR (Flu A&B, Covid) Nasopharyngeal Swab     Status: None   Collection Time: 01/12/21  5:06 AM   Specimen: Nasopharyngeal Swab; Nasopharyngeal(NP) swabs in vial transport medium  Result Value Ref Range Status   SARS Coronavirus 2 by RT PCR NEGATIVE NEGATIVE Final    Comment: (NOTE) SARS-CoV-2 target nucleic acids are NOT DETECTED.  The SARS-CoV-2 RNA is generally detectable in upper respiratory specimens during the acute phase of infection. The lowest concentration of SARS-CoV-2 viral copies this assay can detect is 138 copies/mL. A negative result does not preclude SARS-Cov-2 infection and should not be used as the sole basis for treatment or other patient management decisions. A negative result may occur with  improper specimen collection/handling, submission of specimen other than nasopharyngeal swab, presence of viral mutation(s) within the areas targeted by this assay, and inadequate number of viral copies(<138 copies/mL). A negative result must be combined with clinical observations, patient history, and  epidemiological information. The expected result is Negative.  Fact Sheet for Patients:  EntrepreneurPulse.com.au  Fact Sheet for Healthcare Providers:  IncredibleEmployment.be  This test is no t yet approved or cleared by the [place] FDA and  has been authorized for detection and/or diagnosis of SARS-CoV-2 by FDA under an Emergency Use Authorization (EUA). This EUA will remain  in effect (meaning this test can be used) for the duration of the COVID-19 declaration under Section 564(b)(1) of the Act, 21 U.S.C.section 360bbb-3(b)(1), unless the authorization is terminated  or revoked sooner.       Influenza A by PCR NEGATIVE NEGATIVE  Final   Influenza B by PCR NEGATIVE NEGATIVE Final    Comment: (NOTE) The Xpert Xpress SARS-CoV-2/FLU/RSV plus assay is intended as an aid in the diagnosis of influenza from Nasopharyngeal swab specimens and should not be used as a sole basis for treatment. Nasal washings and aspirates are unacceptable for Xpert Xpress SARS-CoV-2/FLU/RSV testing.  Fact Sheet for Patients: EntrepreneurPulse.com.au  Fact Sheet for Healthcare Providers: IncredibleEmployment.be  This test is not yet approved or cleared by the [place] FDA and has been authorized for detection and/or diagnosis of SARS-CoV-2 by FDA under an Emergency Use Authorization (EUA). This EUA will remain in effect (meaning this test can be used) for the duration of the COVID-19 declaration under Section 564(b)(1) of the Act, 21 U.S.C. section 360bbb-3(b)(1), unless the authorization is terminated or revoked.  Performed at [facility], [address], [place], [place] [address]   MRSA PCR Screening     Status: None   Collection Time: 01/12/21  9:17 AM   Specimen: Nasal Mucosa; Nasopharyngeal  Result Value Ref Range Status   MRSA by PCR NEGATIVE NEGATIVE Final    Comment:        The GeneXpert MRSA Assay  (FDA approved for NASAL specimens only), is one component of a comprehensive MRSA colonization surveillance program. It is not intended to diagnose MRSA infection nor to guide or monitor treatment for MRSA infections. Performed at [facility], [address], [place],  [address]          Radiology Studies: CT Angio Chest PE W and/or Wo Contrast  Result Date: 01/12/2021 CLINICAL DATA:  Shortness of breath for 1 hour, stage IV lung cancer EXAM: CT ANGIOGRAPHY CHEST WITH CONTRAST TECHNIQUE: Multidetector CT imaging of the chest was performed using the standard protocol during bolus administration of intravenous contrast. Multiplanar CT image reconstructions and MIPs were obtained to evaluate the vascular anatomy. CONTRAST:  67mL OMNIPAQUE IOHEXOL 350 MG/ML SOLN COMPARISON:  Outside CT chest 12/09/2020 (report only, images unavailable) outside CT chest 09/24/2020 FINDINGS: Cardiovascular: Satisfactory opacification the pulmonary arteries to the segmental level. No pulmonary artery filling defects are identified. Central pulmonary arteries are normal caliber. Borderline cardiomegaly with biatrial enlargement. Calcifications of the mitral annulus. Coronary artery calcifications are present as well. No pericardial effusion. Atherosclerotic plaque within the normal caliber aorta. No acute luminal abnormality of the imaged aorta. No periaortic stranding or hemorrhage. Normal 3 vessel branching of the aortic arch. Proximal great vessels are mildly calcified but otherwise unremarkable and free of acute abnormality. No major venous abnormalities. Right IJ approach Port-A-Cath tip terminates in the lower SVC. Mediastinum/Nodes: No pathologically enlarged or enlarging mediastinal, hilar or axillary adenopathy. No mediastinal fluid or gas. Normal thyroid gland and thoracic inlet. No acute abnormality of the trachea or esophagus. No worrisome mediastinal, hilar or axillary adenopathy. Lungs/Pleura:  Diffuse pulmonary vascular redistribution, peribronchovascular thickening, and both interlobular septal and fissural thickening throughout both lungs with bilateral effusions favoring pulmonary edema. There are additional areas of mixed patchy ground-glass and consolidative opacity in the lungs which could reflect developing alveolar edema though an acute infectious or inflammatory process is difficult to fully exclude particularly given the anti dependent areas ground-glass in both upper lobes. Stable post treatment related changes are seen in the right lung base. Scattered micro nodules previously seen in the lungs are poorly visualized due to extensive respiratory motion artifact. Some peripheral ground-glass opacity seen in the left upper lobe is grossly unchanged though difficult to fully assess given additional lung parenchyma  findings. Upper Abdomen: No acute abnormalities present in the visualized portions of the upper abdomen. Musculoskeletal: Multilevel degenerative changes are present in the imaged portions of the spine. No acute osseous abnormality or suspicious osseous lesion. Multiple remote left-sided rib fractures are again seen. No acute osseous abnormalities. No suspicious lytic or blastic lesions. No worrisome chest wall masses or lesions. Mild circumferential body wall edema. Right IJ approach Port-A-Cath. Review of the MIP images confirms the above findings. IMPRESSION: 1. No evidence of acute pulmonary artery embolism. 2. Diffuse pulmonary vascular redistribution, peribronchovascular thickening, and bilateral effusions favoring pulmonary edema. Additional areas of mixed patchy ground-glass and consolidative opacity in the lungs could reflect developing alveolar edema though an acute infectious or inflammatory process is difficult to fully exclude particularly given the anti dependent areas ground-glass in both upper lobes. 3. Stable post treatment related changes in the right lung base. 4.  Scattered micro nodules previously seen in the lungs are poorly visualized due to extensive respiratory motion artifact. Accurate staging of patient's malignancy is difficult to perform accurately given extensive respiratory motion artifact, underlying parenchymal processes above, and the absence of images from the patient's most recent comparison exam dated 12/09/2020. 5. Aortic Atherosclerosis (ICD10-I70.0). Electronically Signed   By: Lovena Le M.D.   On: 01/12/2021 04:29   DG Chest Portable 1 View  Result Date: 01/12/2021 CLINICAL DATA:  Shortness of breath EXAM: PORTABLE CHEST 1 VIEW COMPARISON:  11/11/2020 FINDINGS: Right Port-A-Cath remains in place, unchanged. Heart is normal size. Diffuse interstitial prominence throughout the lungs, increasing since prior study. Left basilar airspace opacity. No effusions or acute bony abnormality. Old healed left 6th rib fracture. IMPRESSION: Diffuse interstitial prominence throughout the lungs. Focal left lower lobe airspace opacity. Findings could reflect pneumonia. Electronically Signed   By: Rolm Baptise M.D.   On: 01/12/2021 02:23   ECHOCARDIOGRAM COMPLETE  Result Date: 01/12/2021    ECHOCARDIOGRAM REPORT   Patient Name:   ARIEONNA MEDINE Date of Exam: 01/12/2021 Medical Rec #:  [id number]  Height:       69.0 in Accession #:    [id number] Weight:       121.0 lb Date of Birth:  Aug 04, 1956 BSA:          1.669 m Patient Age:    69 years   BP:           117/51 mmHg Patient Gender: F          HR:           87 bpm. Exam Location:  [facility] Procedure: 2D Echo, Cardiac Doppler and Color Doppler Indications:    Congestive Heart Failure I50.9  History:        Patient has no prior history of Echocardiogram examinations.                 CHF. Acute respiratory failure with hypoxia and hypercapnia                 ([place]), Alcohol dependence ([place]), Tobacco abuse, Lung cancer,                 primary, with metastasis from lung to other site [facility]).  Sonographer:    Alvino Chapel  RCS Referring Phys: [id number] ASIA B Grill  1. Left ventricular ejection fraction, by estimation, is 55 to 60%. The left ventricle has normal function. The left ventricle has no regional wall motion abnormalities. There is mild left ventricular hypertrophy. Left ventricular diastolic parameters are consistent  with Grade II diastolic dysfunction (pseudonormalization). Elevated left atrial pressure.  2. Right ventricular systolic function is normal. The right ventricular size is normal. There is severely elevated pulmonary artery systolic pressure.  3. Left atrial size was severely dilated.  4. Right atrial size was moderately dilated.  5. The mitral valve is abnormal. Mild to moderate mitral valve regurgitation. Mild to moderate mitral stenosis. Severe mitral annular calcification. The mean mitral valve gradient is 5.0 mmHg with average heart rate of 88 bpm.  6. The aortic valve is tricuspid. Aortic valve regurgitation is not visualized.  7. Moderate to severe pulmonary HTN, PASP is 67 mmHg.  8. The inferior vena cava is dilated in size with <50% respiratory variability, suggesting right atrial pressure of 15 mmHg. FINDINGS  Left Ventricle: Left ventricular ejection fraction, by estimation, is 55 to 60%. The left ventricle has normal function. The left ventricle has no regional wall motion abnormalities. The left ventricular internal cavity size was normal in size. There is  mild left ventricular hypertrophy. Left ventricular diastolic parameters are consistent with Grade II diastolic dysfunction (pseudonormalization). Elevated left atrial pressure. Right Ventricle: The right ventricular size is normal. No increase in right ventricular wall thickness. Right ventricular systolic function is normal. There is severely elevated pulmonary artery systolic pressure. The tricuspid regurgitant velocity is 3.61 m/s, and with an assumed right atrial pressure of 8 mmHg, the estimated right ventricular systolic  pressure is 81.2 mmHg. Left Atrium: Left atrial size was severely dilated. Right Atrium: Right atrial size was moderately dilated. Pericardium: There is no evidence of pericardial effusion. Mitral Valve: The mitral valve is abnormal. There is moderate thickening of the mitral valve leaflet(s). There is moderate calcification of the mitral valve leaflet(s). Severe mitral annular calcification. Mild to moderate mitral valve regurgitation. Mild to moderate mitral valve stenosis. The mean mitral valve gradient is 5.0 mmHg with average heart rate of 88 bpm. Tricuspid Valve: The tricuspid valve is not well visualized. Tricuspid valve regurgitation is mild . No evidence of tricuspid stenosis. Aortic Valve: The aortic valve is tricuspid. There is mild aortic valve annular calcification. Aortic valve regurgitation is not visualized. Aortic valve mean gradient measures 3.9 mmHg. Aortic valve peak gradient measures 8.2 mmHg. Aortic valve area, by  VTI measures 2.06 cm. Pulmonic Valve: The pulmonic valve was not well visualized. Pulmonic valve regurgitation is not visualized. No evidence of pulmonic stenosis. Aorta: The aortic root is normal in size and structure. Pulmonary Artery: Moderate to severe pulmonary HTN, PASP is 67 mmHg. Venous: The inferior vena cava is dilated in size with less than 50% respiratory variability, suggesting right atrial pressure of 15 mmHg. IAS/Shunts: The interatrial septum was not well visualized.  LEFT VENTRICLE PLAX 2D LVIDd:         4.70 cm  Diastology LVIDs:         3.40 cm  LV e' medial:    6.53 cm/s LV PW:         1.10 cm  LV E/e' medial:  27.0 LV IVS:        1.00 cm  LV e' lateral:   8.49 cm/s LVOT diam:     1.60 cm  LV E/e' lateral: 20.7 LV SV:         56 LV SV Index:   33 LVOT Area:     2.01 cm  RIGHT VENTRICLE RV S prime:     14.30 cm/s TAPSE (M-mode): 2.4 cm LEFT ATRIUM  Index       RIGHT ATRIUM           Index LA diam:        4.40 cm  2.64 cm/m  RA Area:     18.70 cm LA  Vol (A2C):   116.0 ml 69.51 ml/m RA Volume:   52.10 ml  31.22 ml/m LA Vol (A4C):   93.8 ml  56.21 ml/m LA Biplane Vol: 108.0 ml 64.71 ml/m  AORTIC VALVE AV Area (Vmax):    1.71 cm AV Area (Vmean):   1.98 cm AV Area (VTI):     2.06 cm AV Vmax:           143.59 cm/s AV Vmean:          91.414 cm/s AV VTI:            0.270 m AV Peak Grad:      8.2 mmHg AV Mean Grad:      3.9 mmHg LVOT Vmax:         122.00 cm/s LVOT Vmean:        90.000 cm/s LVOT VTI:          0.277 m LVOT/AV VTI ratio: 1.02  AORTA Ao Root diam: 3.20 cm MITRAL VALVE                TRICUSPID VALVE MV Area (PHT): 3.43 cm     TR Peak grad:   52.1 mmHg MV Mean grad:  5.0 mmHg     TR Vmax:        361.00 cm/s MV Decel Time: 221 msec MV E velocity: 176.00 cm/s  SHUNTS MV A velocity: 127.00 cm/s  Systemic VTI:  0.28 m MV E/A ratio:  1.39         Systemic Diam: 1.60 cm Carlyle Dolly MD Electronically signed by Carlyle Dolly MD Signature Date/Time: 01/12/2021/3:17:25 PM    Final         Scheduled Meds: . apixaban  5 mg Oral BID  . arformoterol  15 mcg Nebulization BID  . budesonide (PULMICORT) nebulizer solution  0.5 mg Nebulization BID  . chlordiazePOXIDE  10 mg Oral TID  . Chlorhexidine Gluconate Cloth  6 each Topical Daily  . dextromethorphan-guaiFENesin  1 tablet Oral BID  . folic acid  1 mg Oral Daily  . furosemide  40 mg Intravenous Q12H  . ipratropium-albuterol  3 mL Nebulization BID  . methylPREDNISolone (SOLU-MEDROL) injection  60 mg Intravenous Q12H  . metoprolol succinate  100 mg Oral Daily  . multivitamin with minerals  1 tablet Oral Daily  . nicotine  14 mg Transdermal Daily  . thiamine  100 mg Oral Daily   Or  . thiamine  100 mg Intravenous Daily   Continuous Infusions: . ceFEPime (MAXIPIME) IV Stopped (01/13/21 5681)  . dexmedetomidine (PRECEDEX) IV infusion 0.4 mcg/kg/hr (01/13/21 0703)     LOS: 1 day    Time spent: 35 minutes    Barton Dubois, MD [facility]   To contact the attending  provider between 7A-7P or the covering provider during after hours 7P-7A, please log into the web site [email or web address] and access using universal [place] password for that web site. If you do not have the password, please call the hospital operator.  01/13/2021, 12:26 PM

## 2021-01-13 NOTE — Progress Notes (Signed)
Bladder scan completed equal greater than 750. Performed in and out cath returned 910 mls of clear yellow urine.

## 2021-01-13 NOTE — Progress Notes (Signed)
Bladder scan patient listed greater than 900. Cynthia Kelley and myself performed in and out cath returned 1300 mls. clear yellow urine

## 2021-01-14 ENCOUNTER — Encounter (HOSPITAL_COMMUNITY): Payer: Self-pay | Admitting: Family Medicine

## 2021-01-14 DIAGNOSIS — J441 Chronic obstructive pulmonary disease with (acute) exacerbation: Secondary | ICD-10-CM

## 2021-01-14 DIAGNOSIS — I5033 Acute on chronic diastolic (congestive) heart failure: Secondary | ICD-10-CM

## 2021-01-14 DIAGNOSIS — F10231 Alcohol dependence with withdrawal delirium: Secondary | ICD-10-CM

## 2021-01-14 HISTORY — DX: Chronic obstructive pulmonary disease with (acute) exacerbation: J44.1

## 2021-01-14 LAB — BASIC METABOLIC PANEL
Anion gap: 8 (ref 5–15)
BUN: 20 mg/dL (ref 8–23)
CO2: 25 mmol/L (ref 22–32)
Calcium: 8.5 mg/dL — ABNORMAL LOW (ref 8.9–10.3)
Chloride: 99 mmol/L (ref 98–111)
Creatinine, Ser: 0.8 mg/dL (ref 0.44–1.00)
GFR, Estimated: >60 mL/min (ref >60)
Glucose, Bld: 114 mg/dL — ABNORMAL HIGH (ref 70–99)
Potassium: 3.8 mmol/L (ref 3.5–5.1)
Sodium: 132 mmol/L — ABNORMAL LOW (ref 135–145)

## 2021-01-14 LAB — CBC
HCT: 33.5 % — ABNORMAL LOW (ref 36.0–46.0)
Hemoglobin: 11.4 g/dL — ABNORMAL LOW (ref 12.0–15.0)
MCH: 37.9 pg — ABNORMAL HIGH (ref 26.0–34.0)
MCHC: 34 g/dL (ref 30.0–36.0)
MCV: 111.3 fL — ABNORMAL HIGH (ref 80.0–100.0)
Platelets: 130 10*3/uL — ABNORMAL LOW (ref 150–400)
RBC: 3.01 MIL/uL — ABNORMAL LOW (ref 3.87–5.11)
RDW: 19.1 % — ABNORMAL HIGH (ref 11.5–15.5)
WBC: 68.5 10*3/uL (ref 4.0–10.5)
nRBC: 0.2 % (ref 0.0–0.2)

## 2021-01-14 LAB — PROCALCITONIN: Procalcitonin: 0.35 ng/mL

## 2021-01-14 MED ORDER — FUROSEMIDE 40 MG PO TABS
40.0000 mg | ORAL_TABLET | Freq: Every day | ORAL | Status: DC
  Administered 2021-01-15: 40 mg via ORAL
  Filled 2021-01-14: qty 1

## 2021-01-14 MED ORDER — PREDNISONE 20 MG PO TABS: 60.0000 mg | ORAL_TABLET | Freq: Every day | ORAL | Status: DC

## 2021-01-14 NOTE — Progress Notes (Signed)
PROGRESS NOTE  [name]:[id number] DOB: October 11, 1955 DOA: 01/12/2021 PCP: Lindell Spar, MD  Brief History:  65 y.o.female,with history of tobacco abuse, lung cancer, peripheral vascular disease, anxiety, acute diastolic congestive heart failure, and more presents the ED with a chief complaint of dyspnea. Patient reports that on 4/18 at 1 AM, she was woke up from sleep by acute dyspnea. Patient reports that everything is not blurred because she was panicking. She does report that prior to going to bed she was in her normal state of health aside from some sinus congestion.  She has denies any fever, chills, cough, hemoptysis, headache, neck pain, chest pain, vomiting, diarrhea.  Notably, the patient follows Dr. Cleaster Corin at [facility] for her heme/onc care.  Her last chemotherapy with Gemzar and Neulasta were on 01/08/2021 for her metastatic SCC of the lung.  She has metastasis to the liver as well as bone.  This was initially diagnosed by liver biopsy in December 2018.  In addition, the patient is also followed by cardiology at [facility], Dr. Demetrios Isaacs for her diastolic CHF and paroxysmal atrial fibrillation for which she was on apixaban.  She had a TEE cardioversion on 12/17/2020.  She finished a course of amiodarone recently. In addition, the patient has been struggling with hyponatremia secondary to SIADH.  She was on sodium tablets 1 g daily which was recently increased to twice daily approximately 3 weeks prior to this admission. Notably, the patient states that she drinks about 3 beers on a daily basis with 3 mixed drinks.  She continues to smoke about 1 pack/day.   In the ED when patient he mentioned in respiratory distress and tripoding. Initially were considering putting her on BiPAP, but after he being given a dose of Ativan patient calmed down and her oxygen saturations were able to be maintained on 4 L nasal cannula and then weaned down to 2 L nasal cannula upon my exam.  Patient was afebrile, heart rate was 74-81, respiratory rate was 19-26, blood pressure stable at 147/69, satting at 100% on 4 L. She was satting at 95% on 2 L upon my exam. Patient had initial ABG that was concerning with a pH of 7.155, CO2 59.7, O2 34.7. Repeat ABG showed a pH of 7.3, CO2 51.3, O2 80. Patient is on Eliquis for paroxysmal A. Fibrillation, but given the acute onset shortness of breath CTA was done to rule out PE. It showed diffuse vascular redistribution. Peribronchovascular thickening and bilateral effusions favoring pulmonary edema. Additional areas of mixed patchy groundglass and consolidative opacity in the lungs could reflect developing alveolar edema though an acute infectious or inflammatory process is difficult to fully exclude. Chest x-ray was read as focal left lower lobe opacity  On the evening of 01/13/2021, the patient had increasing confusion and agitation.  She was started on a Precedex drip due to concerns for possible alcohol withdrawal.  She has been since weaned off the drip, and her spouse states that her mental status is near baseline.  She was also started on IV furosemide with clinical improvement.  Assessment/Plan: Acute respiratory failure with hypoxia and hypercarbia:  -Multifactorial in the setting of COPD exacerbation, bronchiectasis/left lower lobe pneumonia and acute on chronic diastolic heart failure. -Continue IV Lasix -continue bronchodilator management, steroids, nebulizer, mucolytic's and antibiotics. -Patient oxygenation has improved and is currently afebrile -weaned off oxygen -Vancomycin has been discontinued in the setting of negative MRSA PCR.  Acute on  chronic diastolic CHF -change to po lasix -accurate I/O -01/12/21 Echo EF 55-60%, no WMA, mild/mod MR, PASP 67 -CTA chest--no PE; diuffuse pulm vascular redistribution with peribronchovascular redistribution, scattered patchy GGO  alcohol abuse with withdrawal -Patient and requiring  initiation of Precedex overnight 4/18-4/19 -Started on Librium and will wean off Precedex as tolerated -Continue to follow CIWA protocol -Continue thiamine and folic acid -Cessation counseling has been provided. -d/c librium  COPD exacerbation -continue brovana, duonebs -wean IV steroids>>po  Tobacco abuse -Cessation counseling provided -Continue nicotine patch  Anxiety -Currently receiving benzodiazepine -Will follow mood stability. paroxysmal atrial fibrillation -Continue the use of metoprolol and Eliquis. -Rate stable currently.  prolonged QT -continue to monitor on telemetry and follow electrolytes -Continue to avoid as much as possible medications that can further prolong QT segment.  leukocytosis -Continue to follow trend -Patient received Neulasta 01/09/19 -check PCT  acute on chronic hyponatremia -Patient baseline around 125-130 -continue NaCl -Appears to be in the setting of chronic alcohol abuse,  CHF exacerbation  acute urinary retention -Status post In-N-Out cath x1 -Will follow patient's ability to void on her own.      Status is: Inpatient  Remains inpatient appropriate because:IV treatments appropriate due to intensity of illness or inability to take PO   Dispo: The patient is from: Home              Anticipated d/c is to: Home              Patient currently is not medically stable to d/c.   Difficult to place patient No        Family Communication:   Spouse updated at bedside 4/20  Consultants:  none  Code Status:  FULL   DVT Prophylaxis:  apixaban   Procedures: As Listed in Progress Note Above  Antibiotics: Cefepime 4/18>>    Total time spent 35 minutes.  Greater than 50% spent face to face counseling and coordinating care.      Subjective: Patient denies fevers, chills, headache, chest pain, dyspnea, nausea, vomiting, diarrhea, abdominal pain, dysuria, hematuria, hematochezia, and  melena.   Objective: Vitals:   01/14/21 1200 01/14/21 1400 01/14/21 1430 01/14/21 1500  BP:  (!) 116/99 (!) 143/67 125/79  Pulse: 75 98 92 91  Resp: 15  (!) 24 (!) 21  Temp:      TempSrc:      SpO2: 98%  99% 97%  Weight:      Height:        Intake/Output Summary (Last 24 hours) at 01/14/2021 1539 Last data filed at 01/14/2021 0800 Gross per 24 hour  Intake 786.45 ml  Output 1000 ml  Net -213.55 ml   Weight change:  Exam:   General:  Pt is alert, follows commands appropriately, not in acute distress  HEENT: No icterus, No thrush, No neck mass, [place]/AT  Cardiovascular: RRR, S1/S2, no rubs, no gallops  Respiratory: bibasilar rales. No wheeze  Abdomen: Soft/+BS, non tender, non distended, no guarding  Extremities: No edema, No lymphangitis, No petechiae, No rashes, no synovitis   Data Reviewed: I have personally reviewed following labs and imaging studies Basic Metabolic Panel: Recent Labs  Lab 01/12/21 0144 01/13/21 0403  NA 125* 128*  K 4.5 3.6  CL 95* 93*  CO2 22 22  GLUCOSE 113* 127*  BUN 7* 12  CREATININE 0.55 0.66  CALCIUM 8.6* 8.9  MG  --  2.0   Liver Function Tests: Recent Labs  Lab 01/12/21 0144 01/13/21 0403  AST 73* 34  ALT 34 31  ALKPHOS 92 118  BILITOT 1.4* 0.7  PROT 7.6 7.3  ALBUMIN 4.0 3.9   No results for input(s): LIPASE, AMYLASE in the last 168 hours. No results for input(s): AMMONIA in the last 168 hours. Coagulation Profile: No results for input(s): INR, PROTIME in the last 168 hours. CBC: Recent Labs  Lab 01/12/21 0144 01/13/21 0403  WBC 45.5* 53.0*  NEUTROABS 41.9* 50.4*  HGB 12.0 11.0*  HCT 35.5* 32.7*  MCV 111.3* 112.4*  PLT 219 162   Cardiac Enzymes: No results for input(s): CKTOTAL, CKMB, CKMBINDEX, TROPONINI in the last 168 hours. BNP: Invalid input(s): POCBNP CBG: No results for input(s): GLUCAP in the last 168 hours. HbA1C: No results for input(s): HGBA1C in the last 72 hours. Urine analysis:     Component Value Date/Time   COLORURINE YELLOW 01/12/2021 [place] 01/12/2021 0918   LABSPEC 1.030 01/12/2021 0918   PHURINE 6.0 01/12/2021 0918   GLUCOSEU NEGATIVE 01/12/2021 0918   HGBUR MODERATE (A) 01/12/2021 0918   BILIRUBINUR NEGATIVE 01/12/2021 0918   BILIRUBINUR small 09/15/2017 1210   KETONESUR NEGATIVE 01/12/2021 0918   PROTEINUR 30 (A) 01/12/2021 0918   UROBILINOGEN 0.2 09/15/2017 1210   NITRITE NEGATIVE 01/12/2021 0918   LEUKOCYTESUR NEGATIVE 01/12/2021 0918   Sepsis Labs: @LABRCNTIP (procalcitonin:4,lacticidven:4) ) Recent Results (from the past 240 hour(s))  Blood culture (routine x 2)     Status: None (Preliminary result)   Collection Time: 01/12/21  3:47 AM   Specimen: Left Antecubital; Blood  Result Value Ref Range Status   Specimen Description LEFT ANTECUBITAL  Final   Special Requests   Final    BOTTLES DRAWN AEROBIC AND ANAEROBIC Blood Culture adequate volume   Culture   Final    NO GROWTH 2 DAYS Performed at [facility], [address], [place], [place] [address]    Report Status PENDING  Incomplete  Blood culture (routine x 2)     Status: None (Preliminary result)   Collection Time: 01/12/21  3:47 AM   Specimen: BLOOD LEFT FOREARM  Result Value Ref Range Status   Specimen Description BLOOD LEFT FOREARM  Final   Special Requests   Final    BOTTLES DRAWN AEROBIC AND ANAEROBIC Blood Culture results may not be optimal due to an inadequate volume of blood received in culture bottles   Culture   Final    NO GROWTH 2 DAYS Performed at [facility], [address], [place],  [address]    Report Status PENDING  Incomplete  Resp Panel by RT-PCR (Flu A&B, Covid) Nasopharyngeal Swab     Status: None   Collection Time: 01/12/21  5:06 AM   Specimen: Nasopharyngeal Swab; Nasopharyngeal(NP) swabs in vial transport medium  Result Value Ref Range Status   SARS Coronavirus 2 by RT PCR NEGATIVE NEGATIVE Final    Comment: (NOTE) SARS-CoV-2  target nucleic acids are NOT DETECTED.  The SARS-CoV-2 RNA is generally detectable in upper respiratory specimens during the acute phase of infection. The lowest concentration of SARS-CoV-2 viral copies this assay can detect is 138 copies/mL. A negative result does not preclude SARS-Cov-2 infection and should not be used as the sole basis for treatment or other patient management decisions. A negative result may occur with  improper specimen collection/handling, submission of specimen other than nasopharyngeal swab, presence of viral mutation(s) within the areas targeted by this assay, and inadequate number of viral copies(<138 copies/mL). A negative result must be combined with  clinical observations, patient history, and epidemiological information. The expected result is Negative.  Fact Sheet for Patients:  EntrepreneurPulse.com.au  Fact Sheet for Healthcare Providers:  IncredibleEmployment.be  This test is no t yet approved or cleared by the [place] FDA and  has been authorized for detection and/or diagnosis of SARS-CoV-2 by FDA under an Emergency Use Authorization (EUA). This EUA will remain  in effect (meaning this test can be used) for the duration of the COVID-19 declaration under Section 564(b)(1) of the Act, 21 U.S.C.section 360bbb-3(b)(1), unless the authorization is terminated  or revoked sooner.       Influenza A by PCR NEGATIVE NEGATIVE Final   Influenza B by PCR NEGATIVE NEGATIVE Final    Comment: (NOTE) The Xpert Xpress SARS-CoV-2/FLU/RSV plus assay is intended as an aid in the diagnosis of influenza from Nasopharyngeal swab specimens and should not be used as a sole basis for treatment. Nasal washings and aspirates are unacceptable for Xpert Xpress SARS-CoV-2/FLU/RSV testing.  Fact Sheet for Patients: EntrepreneurPulse.com.au  Fact Sheet for Healthcare  Providers: IncredibleEmployment.be  This test is not yet approved or cleared by the [place] FDA and has been authorized for detection and/or diagnosis of SARS-CoV-2 by FDA under an Emergency Use Authorization (EUA). This EUA will remain in effect (meaning this test can be used) for the duration of the COVID-19 declaration under Section 564(b)(1) of the Act, 21 U.S.C. section 360bbb-3(b)(1), unless the authorization is terminated or revoked.  Performed at [facility], [address], [place], [place] [address]   MRSA PCR Screening     Status: None   Collection Time: 01/12/21  9:17 AM   Specimen: Nasal Mucosa; Nasopharyngeal  Result Value Ref Range Status   MRSA by PCR NEGATIVE NEGATIVE Final    Comment:        The GeneXpert MRSA Assay (FDA approved for NASAL specimens only), is one component of a comprehensive MRSA colonization surveillance program. It is not intended to diagnose MRSA infection nor to guide or monitor treatment for MRSA infections. Performed at [facility], [address] [place], [place] [address]      Scheduled Meds: . apixaban  5 mg Oral BID  . arformoterol  15 mcg Nebulization BID  . budesonide (PULMICORT) nebulizer solution  0.5 mg Nebulization BID  . chlordiazePOXIDE  10 mg Oral TID  . Chlorhexidine Gluconate Cloth  6 each Topical Daily  . dextromethorphan-guaiFENesin  1 tablet Oral BID  . folic acid  1 mg Oral Daily  . furosemide  40 mg Intravenous Q12H  . ipratropium-albuterol  3 mL Nebulization BID  . methylPREDNISolone (SOLU-MEDROL) injection  60 mg Intravenous Q12H  . metoprolol succinate  100 mg Oral Daily  . multivitamin with minerals  1 tablet Oral Daily  . nicotine  14 mg Transdermal Daily  . thiamine  100 mg Oral Daily   Or  . thiamine  100 mg Intravenous Daily   Continuous Infusions: . ceFEPime (MAXIPIME) IV Stopped (01/14/21 0626)  . dexmedetomidine (PRECEDEX) IV infusion Stopped (01/14/21 0705)     Procedures/Studies: CT Angio Chest PE W and/or Wo Contrast  Result Date: 01/12/2021 CLINICAL DATA:  Shortness of breath for 1 hour, stage IV lung cancer EXAM: CT ANGIOGRAPHY CHEST WITH CONTRAST TECHNIQUE: Multidetector CT imaging of the chest was performed using the standard protocol during bolus administration of intravenous contrast. Multiplanar CT image reconstructions and MIPs were obtained to evaluate the vascular anatomy. CONTRAST:  71mL OMNIPAQUE IOHEXOL 350 MG/ML SOLN COMPARISON:  Outside CT  chest 12/09/2020 (report only, images unavailable) outside CT chest 09/24/2020 FINDINGS: Cardiovascular: Satisfactory opacification the pulmonary arteries to the segmental level. No pulmonary artery filling defects are identified. Central pulmonary arteries are normal caliber. Borderline cardiomegaly with biatrial enlargement. Calcifications of the mitral annulus. Coronary artery calcifications are present as well. No pericardial effusion. Atherosclerotic plaque within the normal caliber aorta. No acute luminal abnormality of the imaged aorta. No periaortic stranding or hemorrhage. Normal 3 vessel branching of the aortic arch. Proximal great vessels are mildly calcified but otherwise unremarkable and free of acute abnormality. No major venous abnormalities. Right IJ approach Port-A-Cath tip terminates in the lower SVC. Mediastinum/Nodes: No pathologically enlarged or enlarging mediastinal, hilar or axillary adenopathy. No mediastinal fluid or gas. Normal thyroid gland and thoracic inlet. No acute abnormality of the trachea or esophagus. No worrisome mediastinal, hilar or axillary adenopathy. Lungs/Pleura: Diffuse pulmonary vascular redistribution, peribronchovascular thickening, and both interlobular septal and fissural thickening throughout both lungs with bilateral effusions favoring pulmonary edema. There are additional areas of mixed patchy ground-glass and consolidative opacity in the lungs which could  reflect developing alveolar edema though an acute infectious or inflammatory process is difficult to fully exclude particularly given the anti dependent areas ground-glass in both upper lobes. Stable post treatment related changes are seen in the right lung base. Scattered micro nodules previously seen in the lungs are poorly visualized due to extensive respiratory motion artifact. Some peripheral ground-glass opacity seen in the left upper lobe is grossly unchanged though difficult to fully assess given additional lung parenchyma findings. Upper Abdomen: No acute abnormalities present in the visualized portions of the upper abdomen. Musculoskeletal: Multilevel degenerative changes are present in the imaged portions of the spine. No acute osseous abnormality or suspicious osseous lesion. Multiple remote left-sided rib fractures are again seen. No acute osseous abnormalities. No suspicious lytic or blastic lesions. No worrisome chest wall masses or lesions. Mild circumferential body wall edema. Right IJ approach Port-A-Cath. Review of the MIP images confirms the above findings. IMPRESSION: 1. No evidence of acute pulmonary artery embolism. 2. Diffuse pulmonary vascular redistribution, peribronchovascular thickening, and bilateral effusions favoring pulmonary edema. Additional areas of mixed patchy ground-glass and consolidative opacity in the lungs could reflect developing alveolar edema though an acute infectious or inflammatory process is difficult to fully exclude particularly given the anti dependent areas ground-glass in both upper lobes. 3. Stable post treatment related changes in the right lung base. 4. Scattered micro nodules previously seen in the lungs are poorly visualized due to extensive respiratory motion artifact. Accurate staging of patient's malignancy is difficult to perform accurately given extensive respiratory motion artifact, underlying parenchymal processes above, and the absence of images from  the patient's most recent comparison exam dated 12/09/2020. 5. Aortic Atherosclerosis (ICD10-I70.0). Electronically Signed   By: Lovena Le M.D.   On: 01/12/2021 04:29   DG Chest Portable 1 View  Result Date: 01/12/2021 CLINICAL DATA:  Shortness of breath EXAM: PORTABLE CHEST 1 VIEW COMPARISON:  11/11/2020 FINDINGS: Right Port-A-Cath remains in place, unchanged. Heart is normal size. Diffuse interstitial prominence throughout the lungs, increasing since prior study. Left basilar airspace opacity. No effusions or acute bony abnormality. Old healed left 6th rib fracture. IMPRESSION: Diffuse interstitial prominence throughout the lungs. Focal left lower lobe airspace opacity. Findings could reflect pneumonia. Electronically Signed   By: Rolm Baptise M.D.   On: 01/12/2021 02:23   ECHOCARDIOGRAM COMPLETE  Result Date: 01/12/2021    ECHOCARDIOGRAM REPORT   Patient Name:   Cynthia  Kelley Date of Exam: 01/12/2021 Medical Rec #:  [id number]  Height:       69.0 in Accession #:    [id number] Weight:       121.0 lb Date of Birth:  09/22/1956 BSA:          1.669 m Patient Age:    46 years   BP:           117/51 mmHg Patient Gender: F          HR:           87 bpm. Exam Location:  [facility] Procedure: 2D Echo, Cardiac Doppler and Color Doppler Indications:    Congestive Heart Failure I50.9  History:        Patient has no prior history of Echocardiogram examinations.                 CHF. Acute respiratory failure with hypoxia and hypercapnia                 ([place]), Alcohol dependence ([place]), Tobacco abuse, Lung cancer,                 primary, with metastasis from lung to other site [facility]).  Sonographer:    Alvino Chapel RCS Referring Phys: [id number] ASIA B [place]  1. Left ventricular ejection fraction, by estimation, is 55 to 60%. The left ventricle has normal function. The left ventricle has no regional wall motion abnormalities. There is mild left ventricular hypertrophy. Left ventricular diastolic parameters  are consistent with Grade II diastolic dysfunction (pseudonormalization). Elevated left atrial pressure.  2. Right ventricular systolic function is normal. The right ventricular size is normal. There is severely elevated pulmonary artery systolic pressure.  3. Left atrial size was severely dilated.  4. Right atrial size was moderately dilated.  5. The mitral valve is abnormal. Mild to moderate mitral valve regurgitation. Mild to moderate mitral stenosis. Severe mitral annular calcification. The mean mitral valve gradient is 5.0 mmHg with average heart rate of 88 bpm.  6. The aortic valve is tricuspid. Aortic valve regurgitation is not visualized.  7. Moderate to severe pulmonary HTN, PASP is 67 mmHg.  8. The inferior vena cava is dilated in size with <50% respiratory variability, suggesting right atrial pressure of 15 mmHg. FINDINGS  Left Ventricle: Left ventricular ejection fraction, by estimation, is 55 to 60%. The left ventricle has normal function. The left ventricle has no regional wall motion abnormalities. The left ventricular internal cavity size was normal in size. There is  mild left ventricular hypertrophy. Left ventricular diastolic parameters are consistent with Grade II diastolic dysfunction (pseudonormalization). Elevated left atrial pressure. Right Ventricle: The right ventricular size is normal. No increase in right ventricular wall thickness. Right ventricular systolic function is normal. There is severely elevated pulmonary artery systolic pressure. The tricuspid regurgitant velocity is 3.61 m/s, and with an assumed right atrial pressure of 8 mmHg, the estimated right ventricular systolic pressure is 29.7 mmHg. Left Atrium: Left atrial size was severely dilated. Right Atrium: Right atrial size was moderately dilated. Pericardium: There is no evidence of pericardial effusion. Mitral Valve: The mitral valve is abnormal. There is moderate thickening of the mitral valve leaflet(s). There is moderate  calcification of the mitral valve leaflet(s). Severe mitral annular calcification. Mild to moderate mitral valve regurgitation. Mild to moderate mitral valve stenosis. The mean mitral valve gradient is 5.0 mmHg with average heart rate of 88 bpm. Tricuspid Valve: The tricuspid valve is not  well visualized. Tricuspid valve regurgitation is mild . No evidence of tricuspid stenosis. Aortic Valve: The aortic valve is tricuspid. There is mild aortic valve annular calcification. Aortic valve regurgitation is not visualized. Aortic valve mean gradient measures 3.9 mmHg. Aortic valve peak gradient measures 8.2 mmHg. Aortic valve area, by  VTI measures 2.06 cm. Pulmonic Valve: The pulmonic valve was not well visualized. Pulmonic valve regurgitation is not visualized. No evidence of pulmonic stenosis. Aorta: The aortic root is normal in size and structure. Pulmonary Artery: Moderate to severe pulmonary HTN, PASP is 67 mmHg. Venous: The inferior vena cava is dilated in size with less than 50% respiratory variability, suggesting right atrial pressure of 15 mmHg. IAS/Shunts: The interatrial septum was not well visualized.  LEFT VENTRICLE PLAX 2D LVIDd:         4.70 cm  Diastology LVIDs:         3.40 cm  LV e' medial:    6.53 cm/s LV PW:         1.10 cm  LV E/e' medial:  27.0 LV IVS:        1.00 cm  LV e' lateral:   8.49 cm/s LVOT diam:     1.60 cm  LV E/e' lateral: 20.7 LV SV:         56 LV SV Index:   33 LVOT Area:     2.01 cm  RIGHT VENTRICLE RV S prime:     14.30 cm/s TAPSE (M-mode): 2.4 cm LEFT ATRIUM              Index       RIGHT ATRIUM           Index LA diam:        4.40 cm  2.64 cm/m  RA Area:     18.70 cm LA Vol (A2C):   116.0 ml 69.51 ml/m RA Volume:   52.10 ml  31.22 ml/m LA Vol (A4C):   93.8 ml  56.21 ml/m LA Biplane Vol: 108.0 ml 64.71 ml/m  AORTIC VALVE AV Area (Vmax):    1.71 cm AV Area (Vmean):   1.98 cm AV Area (VTI):     2.06 cm AV Vmax:           143.59 cm/s AV Vmean:          91.414 cm/s AV VTI:             0.270 m AV Peak Grad:      8.2 mmHg AV Mean Grad:      3.9 mmHg LVOT Vmax:         122.00 cm/s LVOT Vmean:        90.000 cm/s LVOT VTI:          0.277 m LVOT/AV VTI ratio: 1.02  AORTA Ao Root diam: 3.20 cm MITRAL VALVE                TRICUSPID VALVE MV Area (PHT): 3.43 cm     TR Peak grad:   52.1 mmHg MV Mean grad:  5.0 mmHg     TR Vmax:        361.00 cm/s MV Decel Time: 221 msec MV E velocity: 176.00 cm/s  SHUNTS MV A velocity: 127.00 cm/s  Systemic VTI:  0.28 m MV E/A ratio:  1.39         Systemic Diam: 1.60 cm Carlyle Dolly MD Electronically signed by Carlyle Dolly MD Signature Date/Time: 01/12/2021/3:17:25 PM    Final  Orson Eva, DO  [facility]  If 7PM-7AM, please contact night-coverage [email or web address] Password [facility] 01/14/2021, 3:39 PM   LOS: 2 days

## 2021-01-14 NOTE — Progress Notes (Signed)
Patient is awake and calm Oriented to Self and place

## 2021-01-14 NOTE — Progress Notes (Signed)
Patient is able to rest and awakes easily and is conversational with some intermittent confusion.

## 2021-01-15 DIAGNOSIS — F1020 Alcohol dependence, uncomplicated: Secondary | ICD-10-CM

## 2021-01-15 LAB — CBC WITH DIFFERENTIAL/PLATELET
Abs Immature Granulocytes: 4.01 10*3/uL — ABNORMAL HIGH (ref 0.00–0.07)
Basophils Absolute: 0 10*3/uL (ref 0.0–0.1)
Basophils Relative: 0 %
Eosinophils Absolute: 0 10*3/uL (ref 0.0–0.5)
Eosinophils Relative: 0 %
HCT: 33.4 % — ABNORMAL LOW (ref 36.0–46.0)
Hemoglobin: 11.1 g/dL — ABNORMAL LOW (ref 12.0–15.0)
Immature Granulocytes: 6 %
Lymphocytes Relative: 4 %
Lymphs Abs: 3.1 10*3/uL (ref 0.7–4.0)
MCH: 37.2 pg — ABNORMAL HIGH (ref 26.0–34.0)
MCHC: 33.2 g/dL (ref 30.0–36.0)
MCV: 112.1 fL — ABNORMAL HIGH (ref 80.0–100.0)
Monocytes Absolute: 3.7 10*3/uL — ABNORMAL HIGH (ref 0.1–1.0)
Monocytes Relative: 5 %
Neutro Abs: 60.7 10*3/uL — ABNORMAL HIGH (ref 1.7–7.7)
Neutrophils Relative %: 85 %
Platelets: 115 10*3/uL — ABNORMAL LOW (ref 150–400)
RBC: 2.98 MIL/uL — ABNORMAL LOW (ref 3.87–5.11)
RDW: 19.5 % — ABNORMAL HIGH (ref 11.5–15.5)
WBC: 71.6 10*3/uL (ref 4.0–10.5)
nRBC: 0.2 % (ref 0.0–0.2)

## 2021-01-15 LAB — MAGNESIUM: Magnesium: 2.2 mg/dL (ref 1.7–2.4)

## 2021-01-15 LAB — BASIC METABOLIC PANEL
Anion gap: 9 (ref 5–15)
BUN: 20 mg/dL (ref 8–23)
CO2: 25 mmol/L (ref 22–32)
Calcium: 8.3 mg/dL — ABNORMAL LOW (ref 8.9–10.3)
Chloride: 100 mmol/L (ref 98–111)
Creatinine, Ser: 0.57 mg/dL (ref 0.44–1.00)
GFR, Estimated: >60 mL/min (ref >60)
Glucose, Bld: 93 mg/dL (ref 70–99)
Potassium: 3 mmol/L — ABNORMAL LOW (ref 3.5–5.1)
Sodium: 134 mmol/L — ABNORMAL LOW (ref 135–145)

## 2021-01-15 LAB — FOLATE: Folate: 11.1 ng/mL (ref >5.9)

## 2021-01-15 LAB — VITAMIN B12: Vitamin B-12: 2585 pg/mL — ABNORMAL HIGH (ref 180–914)

## 2021-01-15 MED ORDER — AMOXICILLIN-POT CLAVULANATE 875-125 MG PO TABS: 1.0000 | ORAL_TABLET | Freq: Two times a day (BID) | ORAL | 0 refills | Status: DC

## 2021-01-15 MED ORDER — POTASSIUM CHLORIDE CRYS ER 20 MEQ PO TBCR
40.0000 meq | EXTENDED_RELEASE_TABLET | Freq: Once | ORAL | Status: AC
  Administered 2021-01-15: 40 meq via ORAL
  Filled 2021-01-15: qty 2

## 2021-01-15 MED ORDER — FUROSEMIDE 20 MG PO TABS: 20.0000 mg | ORAL_TABLET | Freq: Every day | ORAL | 1 refills | Status: DC

## 2021-01-15 MED ORDER — HEPARIN SOD (PORK) LOCK FLUSH 100 UNIT/ML IV SOLN
500.0000 [IU] | Freq: Once | INTRAVENOUS | Status: AC
  Administered 2021-01-15: 500 [IU] via INTRAVENOUS
  Filled 2021-01-15: qty 5

## 2021-01-15 MED ORDER — METOPROLOL SUCCINATE ER 50 MG PO TB24
200.0000 mg | ORAL_TABLET | Freq: Every day | ORAL | Status: DC
  Administered 2021-01-15: 200 mg via ORAL
  Filled 2021-01-15: qty 4

## 2021-01-15 MED ORDER — PREDNISONE 20 MG PO TABS
40.0000 mg | ORAL_TABLET | Freq: Every day | ORAL | Status: DC
  Administered 2021-01-15: 40 mg via ORAL
  Filled 2021-01-15: qty 2

## 2021-01-15 MED ORDER — PREDNISONE 20 MG PO TABS: 40.0000 mg | ORAL_TABLET | Freq: Every day | ORAL | 0 refills | Status: DC

## 2021-01-15 MED ORDER — AMOXICILLIN-POT CLAVULANATE 875-125 MG PO TABS
1.0000 | ORAL_TABLET | Freq: Two times a day (BID) | ORAL | Status: DC
  Administered 2021-01-15: 1 via ORAL
  Filled 2021-01-15: qty 1

## 2021-01-15 NOTE — Plan of Care (Signed)
  Problem: Education: Goal: Knowledge of General Education information will improve Description: Including pain rating scale, medication(s)/side effects and non-pharmacologic comfort measures Outcome: Adequate for Discharge   Problem: Health Behavior/Discharge Planning: Goal: Ability to manage health-related needs will improve Outcome: Adequate for Discharge   Problem: Clinical Measurements: Goal: Ability to maintain clinical measurements within normal limits will improve Outcome: Adequate for Discharge Goal: Will remain free from infection Outcome: Adequate for Discharge Goal: Diagnostic test results will improve Outcome: Adequate for Discharge Goal: Respiratory complications will improve Outcome: Adequate for Discharge Goal: Cardiovascular complication will be avoided Outcome: Adequate for Discharge   Problem: Activity: Goal: Risk for activity intolerance will decrease Outcome: Adequate for Discharge   Problem: Nutrition: Goal: Adequate nutrition will be maintained Outcome: Adequate for Discharge   Problem: Coping: Goal: Level of anxiety will decrease Outcome: Adequate for Discharge   Problem: Elimination: Goal: Will not experience complications related to bowel motility Outcome: Adequate for Discharge Goal: Will not experience complications related to urinary retention Outcome: Adequate for Discharge   Problem: Safety: Goal: Ability to remain free from injury will improve Outcome: Adequate for Discharge   Problem: Pain Managment: Goal: General experience of comfort will improve Outcome: Adequate for Discharge   Problem: Skin Integrity: Goal: Risk for impaired skin integrity will decrease Outcome: Adequate for Discharge

## 2021-01-15 NOTE — Progress Notes (Signed)
Patient discharged home with husband at 57. Discharge instructions reviewed with patient and husband. Follow up with PCP in 1-2 weeks and repeat labs in 1 week. Patient verbalized understanding. Medications reviewed, sent to [facility] pharmacy for pick up. Teach back completed on med admin. Patients port de-accessed and flushed with 10 cc of NS and then Heparin 116ml/cc 2.5 cc mixed with 2.5cc of NS. Minimal bleeding noted. Patient vitals stable. All personal belongings accounted for.

## 2021-01-15 NOTE — Discharge Summary (Addendum)
Physician Discharge Summary  Tenise Stetler MWU:[id number] DOB: April 26, 1956 DOA: 01/12/2021  PCP: Lindell Spar, MD  Admit date: 01/12/2021 Discharge date: 01/15/2021  Admitted From: Home Disposition:  Home   Recommendations for Outpatient Follow-up:  1. Follow up with PCP in 1-2 weeks 2. Please obtain BMP/CBC in one week     Discharge Condition: Stable CODE STATUS: FULL Diet recommendation: Heart Healthy    Brief/Interim Summary: 65 y.o.female,with history of tobacco abuse, lung cancer, peripheral vascular disease, anxiety, acute diastolic congestive heart failure, and more presents the ED with a chief complaint of dyspnea. Patient reports that on 4/18 at 1 AM, she was woke up from sleep by acute dyspnea. Patient reports that everything is not blurred because she was panicking. She does report that prior to going to bed she was in her normal state of health aside from some sinus congestion.  She has denies any fever, chills, cough, hemoptysis, headache, neck pain, chest pain, vomiting, diarrhea.  Notably, the patient follows Dr. Cleaster Corin at [facility] for her heme/onc care.  Her last chemotherapy with Gemzar and Neulasta were on 01/08/2021 for her metastatic SCC of the lung.  She has metastasis to the liver as well as bone.  This was initially diagnosed by liver biopsy in December 2018.  In addition, the patient is also followed by cardiology at [facility], Dr. Demetrios Isaacs for her diastolic CHF and paroxysmal atrial fibrillation for which she was on apixaban.  She had a TEE cardioversion on 12/17/2020.  She finished a course of amiodarone recently. In addition, the patient has been struggling with hyponatremia secondary to SIADH.  She was on sodium tablets 1 g daily which was recently increased to twice daily approximately 3 weeks prior to this admission. Notably, the patient states that she drinks about 3 beers on a daily basis with 3 mixed drinks.  She continues to smoke about 1  pack/day.   In the ED when patient he mentioned in respiratory distress and tripoding. Initially were considering putting her on BiPAP, but after he being given a dose of Ativan patient calmed down and her oxygen saturations were able to be maintained on 4 L nasal cannula and then weaned down to 2 L nasal cannula upon my exam. Patient was afebrile, heart rate was 74-81, respiratory rate was 19-26, blood pressure stable at 147/69, satting at 100% on 4 L. She was satting at 95% on 2 L upon my exam. Patient had initial ABG that was concerning with a pH of 7.155, CO2 59.7, O2 34.7. Repeat ABG showed a pH of 7.3, CO2 51.3, O2 80. Patient is on Eliquis for paroxysmal A. Fibrillation, but given the acute onset shortness of breath CTA was done to rule out PE. It showed diffuse vascular redistribution. Peribronchovascular thickening and bilateral effusions favoring pulmonary edema. Additional areas of mixed patchy groundglass and consolidative opacity in the lungs could reflect developing alveolar edema though an acute infectious or inflammatory process is difficult to fully exclude. Chest x-ray was read as focal left lower lobe opacity  On the evening of 01/13/2021, the patient had increasing confusion and agitation.  She was started on a Precedex drip due to concerns for possible alcohol withdrawal.  She has been since weaned off the drip, and her spouse states that her mental status is near baseline.  She was also started on IV furosemide with clinical improvement.  Discharge Diagnoses:   Acute respiratory failure with hypoxia and hypercarbia: -Multifactorial in the setting of COPD exacerbation, bronchiectasis/left  lower lobe pneumonia and acute on chronic diastolic heart failure. -Continue IV Lasix -continue bronchodilator management, steroids, nebulizer, mucolytic's and antibiotics. -empirically started on cefepime-->d/c home with amox/clav x 4 more days -Patient oxygenation has improved and  is currently afebrile -weaned off oxygen -Vancomycin has been discontinued in the setting of negative MRSA PCR. -stable on RA at time of d/c   Acute on chronic diastolic CHF -IV changed to po lasix-->d/c home with lasix 20 mg daily -follow up with cardiology at [facility] -accurate I/O -01/12/21 Echo EF 55-60%, no WMA, mild/mod MR, PASP 67 -CTA chest--no PE; diuffuse pulm vascular redistribution with peribronchovascular redistribution, scattered patchy GGO  alcohol abuse with withdrawal -Patient and requiring initiation of Precedex overnight 4/18-4/19 -Started on Librium and will wean off Precedex as tolerated -Continue to follow CIWA protocol -Continue thiamine and folic acid -Cessation counseling has been provided. -d/c librium--pt remained clinically stable thereafter  COPD exacerbation -continue brovana, duonebs -wean IV steroids>>po -d/c home with 2 more days prednisone  Tobacco abuse -Cessation counseling provided -Continue nicotine patch  Anxiety -Currently receiving benzodiazepine  paroxysmal atrial fibrillation -Continue metoprolol and Eliquis. -Rate stable in sinus  prolonged QT -continue to monitor on telemetry and follow electrolytes -Continue to avoid as much as possible medications that can further prolong QT segment.  leukocytosis -most likely leukemoid reaction due to combination of steroids and Neulasta -no diarrhea; clinically improving/stable -Patient received Neulasta 01/09/19 -check PCT--0.35  acute on chronic hyponatremia -Patient baseline around 125-130 -patient did not receive any NaCl tabs during the hospitalization -Na improved with diuresis -Na 134 on day of d/c -Appears to be in the setting of chronic alcohol abuse,  CHF exacerbation  Hypokalemia -repleted -mag 2.2  acute urinary retention -Status post In-N-Out cath x1 -patient able to urinate thereafter  Discharge Instructions   Allergies as of 01/15/2021   No Known  Allergies     Medication List    STOP taking these medications   amiodarone 200 MG tablet Commonly known as: PACERONE   calcium carbonate 1250 (500 Ca) MG tablet Commonly known as: OS-CAL - dosed in mg of elemental calcium   sodium chloride 1 g tablet     TAKE these medications   apixaban 5 MG Tabs tablet Commonly known as: ELIQUIS Take 5 mg by mouth 2 (two) times daily.   furosemide 20 MG tablet Commonly known as: LASIX Take 1 tablet (20 mg total) by mouth daily. What changed:   when to take this  reasons to take this   LORazepam 1 MG tablet Commonly known as: ATIVAN Take 1 mg by mouth at bedtime.   metoprolol 200 MG 24 hr tablet Commonly known as: TOPROL-XL Take 200 mg by mouth daily.   multivitamin with minerals tablet Take 1 tablet by mouth daily.   potassium chloride SA 20 MEQ tablet Commonly known as: KLOR-CON Take 20 mEq by mouth daily.   predniSONE 20 MG tablet Commonly known as: DELTASONE Take 2 tablets (40 mg total) by mouth daily with breakfast.       No Known Allergies  Consultations:  none   Procedures/Studies: CT Angio Chest PE W and/or Wo Contrast  Result Date: 01/12/2021 CLINICAL DATA:  Shortness of breath for 1 hour, stage IV lung cancer EXAM: CT ANGIOGRAPHY CHEST WITH CONTRAST TECHNIQUE: Multidetector CT imaging of the chest was performed using the standard protocol during bolus administration of intravenous contrast. Multiplanar CT image reconstructions and MIPs were obtained to evaluate the vascular anatomy. CONTRAST:  60mL OMNIPAQUE IOHEXOL  350 MG/ML SOLN COMPARISON:  Outside CT chest 12/09/2020 (report only, images unavailable) outside CT chest 09/24/2020 FINDINGS: Cardiovascular: Satisfactory opacification the pulmonary arteries to the segmental level. No pulmonary artery filling defects are identified. Central pulmonary arteries are normal caliber. Borderline cardiomegaly with biatrial enlargement. Calcifications of the mitral  annulus. Coronary artery calcifications are present as well. No pericardial effusion. Atherosclerotic plaque within the normal caliber aorta. No acute luminal abnormality of the imaged aorta. No periaortic stranding or hemorrhage. Normal 3 vessel branching of the aortic arch. Proximal great vessels are mildly calcified but otherwise unremarkable and free of acute abnormality. No major venous abnormalities. Right IJ approach Port-A-Cath tip terminates in the lower SVC. Mediastinum/Nodes: No pathologically enlarged or enlarging mediastinal, hilar or axillary adenopathy. No mediastinal fluid or gas. Normal thyroid gland and thoracic inlet. No acute abnormality of the trachea or esophagus. No worrisome mediastinal, hilar or axillary adenopathy. Lungs/Pleura: Diffuse pulmonary vascular redistribution, peribronchovascular thickening, and both interlobular septal and fissural thickening throughout both lungs with bilateral effusions favoring pulmonary edema. There are additional areas of mixed patchy ground-glass and consolidative opacity in the lungs which could reflect developing alveolar edema though an acute infectious or inflammatory process is difficult to fully exclude particularly given the anti dependent areas ground-glass in both upper lobes. Stable post treatment related changes are seen in the right lung base. Scattered micro nodules previously seen in the lungs are poorly visualized due to extensive respiratory motion artifact. Some peripheral ground-glass opacity seen in the left upper lobe is grossly unchanged though difficult to fully assess given additional lung parenchyma findings. Upper Abdomen: No acute abnormalities present in the visualized portions of the upper abdomen. Musculoskeletal: Multilevel degenerative changes are present in the imaged portions of the spine. No acute osseous abnormality or suspicious osseous lesion. Multiple remote left-sided rib fractures are again seen. No acute osseous  abnormalities. No suspicious lytic or blastic lesions. No worrisome chest wall masses or lesions. Mild circumferential body wall edema. Right IJ approach Port-A-Cath. Review of the MIP images confirms the above findings. IMPRESSION: 1. No evidence of acute pulmonary artery embolism. 2. Diffuse pulmonary vascular redistribution, peribronchovascular thickening, and bilateral effusions favoring pulmonary edema. Additional areas of mixed patchy ground-glass and consolidative opacity in the lungs could reflect developing alveolar edema though an acute infectious or inflammatory process is difficult to fully exclude particularly given the anti dependent areas ground-glass in both upper lobes. 3. Stable post treatment related changes in the right lung base. 4. Scattered micro nodules previously seen in the lungs are poorly visualized due to extensive respiratory motion artifact. Accurate staging of patient's malignancy is difficult to perform accurately given extensive respiratory motion artifact, underlying parenchymal processes above, and the absence of images from the patient's most recent comparison exam dated 12/09/2020. 5. Aortic Atherosclerosis (ICD10-I70.0). Electronically Signed   By: Lovena Le M.D.   On: 01/12/2021 04:29   DG Chest Portable 1 View  Result Date: 01/12/2021 CLINICAL DATA:  Shortness of breath EXAM: PORTABLE CHEST 1 VIEW COMPARISON:  11/11/2020 FINDINGS: Right Port-A-Cath remains in place, unchanged. Heart is normal size. Diffuse interstitial prominence throughout the lungs, increasing since prior study. Left basilar airspace opacity. No effusions or acute bony abnormality. Old healed left 6th rib fracture. IMPRESSION: Diffuse interstitial prominence throughout the lungs. Focal left lower lobe airspace opacity. Findings could reflect pneumonia. Electronically Signed   By: Rolm Baptise M.D.   On: 01/12/2021 02:23   ECHOCARDIOGRAM COMPLETE  Result Date: 01/12/2021    ECHOCARDIOGRAM REPORT  Patient Name:   TRENIYAH LYNN Date of Exam: 01/12/2021 Medical Rec #:  [id number]  Height:       69.0 in Accession #:    [id number] Weight:       121.0 lb Date of Birth:  [birth year] BSA:          1.669 m Patient Age:    35 years   BP:           117/51 mmHg Patient Gender: F          HR:           87 bpm. Exam Location:  [facility] Procedure: 2D Echo, Cardiac Doppler and Color Doppler Indications:    Congestive Heart Failure I50.9  History:        Patient has no prior history of Echocardiogram examinations.                 CHF. Acute respiratory failure with hypoxia and hypercapnia                 ([place]), Alcohol dependence ([place]), Tobacco abuse, Lung cancer,                 primary, with metastasis from lung to other site [facility]).  Sonographer:    Alvino Chapel RCS Referring Phys: [id number] ASIA B [place]  1. Left ventricular ejection fraction, by estimation, is 55 to 60%. The left ventricle has normal function. The left ventricle has no regional wall motion abnormalities. There is mild left ventricular hypertrophy. Left ventricular diastolic parameters are consistent with Grade II diastolic dysfunction (pseudonormalization). Elevated left atrial pressure.  2. Right ventricular systolic function is normal. The right ventricular size is normal. There is severely elevated pulmonary artery systolic pressure.  3. Left atrial size was severely dilated.  4. Right atrial size was moderately dilated.  5. The mitral valve is abnormal. Mild to moderate mitral valve regurgitation. Mild to moderate mitral stenosis. Severe mitral annular calcification. The mean mitral valve gradient is 5.0 mmHg with average heart rate of 88 bpm.  6. The aortic valve is tricuspid. Aortic valve regurgitation is not visualized.  7. Moderate to severe pulmonary HTN, PASP is 67 mmHg.  8. The inferior vena cava is dilated in size with <50% respiratory variability, suggesting right atrial pressure of 15 mmHg. FINDINGS  Left Ventricle: Left  ventricular ejection fraction, by estimation, is 55 to 60%. The left ventricle has normal function. The left ventricle has no regional wall motion abnormalities. The left ventricular internal cavity size was normal in size. There is  mild left ventricular hypertrophy. Left ventricular diastolic parameters are consistent with Grade II diastolic dysfunction (pseudonormalization). Elevated left atrial pressure. Right Ventricle: The right ventricular size is normal. No increase in right ventricular wall thickness. Right ventricular systolic function is normal. There is severely elevated pulmonary artery systolic pressure. The tricuspid regurgitant velocity is 3.61 m/s, and with an assumed right atrial pressure of 8 mmHg, the estimated right ventricular systolic pressure is 40.1 mmHg. Left Atrium: Left atrial size was severely dilated. Right Atrium: Right atrial size was moderately dilated. Pericardium: There is no evidence of pericardial effusion. Mitral Valve: The mitral valve is abnormal. There is moderate thickening of the mitral valve leaflet(s). There is moderate calcification of the mitral valve leaflet(s). Severe mitral annular calcification. Mild to moderate mitral valve regurgitation. Mild to moderate mitral valve stenosis. The mean mitral valve gradient is 5.0 mmHg with average heart rate of 88 bpm. Tricuspid Valve: The  tricuspid valve is not well visualized. Tricuspid valve regurgitation is mild . No evidence of tricuspid stenosis. Aortic Valve: The aortic valve is tricuspid. There is mild aortic valve annular calcification. Aortic valve regurgitation is not visualized. Aortic valve mean gradient measures 3.9 mmHg. Aortic valve peak gradient measures 8.2 mmHg. Aortic valve area, by  VTI measures 2.06 cm. Pulmonic Valve: The pulmonic valve was not well visualized. Pulmonic valve regurgitation is not visualized. No evidence of pulmonic stenosis. Aorta: The aortic root is normal in size and structure. Pulmonary  Artery: Moderate to severe pulmonary HTN, PASP is 67 mmHg. Venous: The inferior vena cava is dilated in size with less than 50% respiratory variability, suggesting right atrial pressure of 15 mmHg. IAS/Shunts: The interatrial septum was not well visualized.  LEFT VENTRICLE PLAX 2D LVIDd:         4.70 cm  Diastology LVIDs:         3.40 cm  LV e' medial:    6.53 cm/s LV PW:         1.10 cm  LV E/e' medial:  27.0 LV IVS:        1.00 cm  LV e' lateral:   8.49 cm/s LVOT diam:     1.60 cm  LV E/e' lateral: 20.7 LV SV:         56 LV SV Index:   33 LVOT Area:     2.01 cm  RIGHT VENTRICLE RV S prime:     14.30 cm/s TAPSE (M-mode): 2.4 cm LEFT ATRIUM              Index       RIGHT ATRIUM           Index LA diam:        4.40 cm  2.64 cm/m  RA Area:     18.70 cm LA Vol (A2C):   116.0 ml 69.51 ml/m RA Volume:   52.10 ml  31.22 ml/m LA Vol (A4C):   93.8 ml  56.21 ml/m LA Biplane Vol: 108.0 ml 64.71 ml/m  AORTIC VALVE AV Area (Vmax):    1.71 cm AV Area (Vmean):   1.98 cm AV Area (VTI):     2.06 cm AV Vmax:           143.59 cm/s AV Vmean:          91.414 cm/s AV VTI:            0.270 m AV Peak Grad:      8.2 mmHg AV Mean Grad:      3.9 mmHg LVOT Vmax:         122.00 cm/s LVOT Vmean:        90.000 cm/s LVOT VTI:          0.277 m LVOT/AV VTI ratio: 1.02  AORTA Ao Root diam: 3.20 cm MITRAL VALVE                TRICUSPID VALVE MV Area (PHT): 3.43 cm     TR Peak grad:   52.1 mmHg MV Mean grad:  5.0 mmHg     TR Vmax:        361.00 cm/s MV Decel Time: 221 msec MV E velocity: 176.00 cm/s  SHUNTS MV A velocity: 127.00 cm/s  Systemic VTI:  0.28 m MV E/A ratio:  1.39         Systemic Diam: 1.60 cm Carlyle Dolly MD Electronically signed by Carlyle Dolly MD Signature Date/Time: 01/12/2021/3:17:25 PM  Final          Discharge Exam: Vitals:   01/15/21 0741 01/15/21 0810  BP:    Pulse:    Resp:    Temp:  98.3 F (36.8 C)  SpO2: 97%    Vitals:   01/15/21 0200 01/15/21 0500 01/15/21 0741 01/15/21 0810  BP:       Pulse: 86     Resp: 17     Temp:  98.6 F (37 C)  98.3 F (36.8 C)  TempSrc:  Oral  Oral  SpO2: (!) 89%  97%   Weight:  56.1 kg    Height:        General: Pt is alert, awake, not in acute distress Cardiovascular: RRR, S1/S2 +, no rubs, no gallops Respiratory: diminished BS;  Bibasilar rales. No wheeze Abdominal: Soft, NT, ND, bowel sounds + Extremities: no edema, no cyanosis   The results of significant diagnostics from this hospitalization (including imaging, microbiology, ancillary and laboratory) are listed below for reference.    Significant Diagnostic Studies: CT Angio Chest PE W and/or Wo Contrast  Result Date: 01/12/2021 CLINICAL DATA:  Shortness of breath for 1 hour, stage IV lung cancer EXAM: CT ANGIOGRAPHY CHEST WITH CONTRAST TECHNIQUE: Multidetector CT imaging of the chest was performed using the standard protocol during bolus administration of intravenous contrast. Multiplanar CT image reconstructions and MIPs were obtained to evaluate the vascular anatomy. CONTRAST:  28mL OMNIPAQUE IOHEXOL 350 MG/ML SOLN COMPARISON:  Outside CT chest 12/09/2020 (report only, images unavailable) outside CT chest 09/24/2020 FINDINGS: Cardiovascular: Satisfactory opacification the pulmonary arteries to the segmental level. No pulmonary artery filling defects are identified. Central pulmonary arteries are normal caliber. Borderline cardiomegaly with biatrial enlargement. Calcifications of the mitral annulus. Coronary artery calcifications are present as well. No pericardial effusion. Atherosclerotic plaque within the normal caliber aorta. No acute luminal abnormality of the imaged aorta. No periaortic stranding or hemorrhage. Normal 3 vessel branching of the aortic arch. Proximal great vessels are mildly calcified but otherwise unremarkable and free of acute abnormality. No major venous abnormalities. Right IJ approach Port-A-Cath tip terminates in the lower SVC. Mediastinum/Nodes: No pathologically  enlarged or enlarging mediastinal, hilar or axillary adenopathy. No mediastinal fluid or gas. Normal thyroid gland and thoracic inlet. No acute abnormality of the trachea or esophagus. No worrisome mediastinal, hilar or axillary adenopathy. Lungs/Pleura: Diffuse pulmonary vascular redistribution, peribronchovascular thickening, and both interlobular septal and fissural thickening throughout both lungs with bilateral effusions favoring pulmonary edema. There are additional areas of mixed patchy ground-glass and consolidative opacity in the lungs which could reflect developing alveolar edema though an acute infectious or inflammatory process is difficult to fully exclude particularly given the anti dependent areas ground-glass in both upper lobes. Stable post treatment related changes are seen in the right lung base. Scattered micro nodules previously seen in the lungs are poorly visualized due to extensive respiratory motion artifact. Some peripheral ground-glass opacity seen in the left upper lobe is grossly unchanged though difficult to fully assess given additional lung parenchyma findings. Upper Abdomen: No acute abnormalities present in the visualized portions of the upper abdomen. Musculoskeletal: Multilevel degenerative changes are present in the imaged portions of the spine. No acute osseous abnormality or suspicious osseous lesion. Multiple remote left-sided rib fractures are again seen. No acute osseous abnormalities. No suspicious lytic or blastic lesions. No worrisome chest wall masses or lesions. Mild circumferential body wall edema. Right IJ approach Port-A-Cath. Review of the MIP images confirms the above findings. IMPRESSION: 1.  No evidence of acute pulmonary artery embolism. 2. Diffuse pulmonary vascular redistribution, peribronchovascular thickening, and bilateral effusions favoring pulmonary edema. Additional areas of mixed patchy ground-glass and consolidative opacity in the lungs could reflect  developing alveolar edema though an acute infectious or inflammatory process is difficult to fully exclude particularly given the anti dependent areas ground-glass in both upper lobes. 3. Stable post treatment related changes in the right lung base. 4. Scattered micro nodules previously seen in the lungs are poorly visualized due to extensive respiratory motion artifact. Accurate staging of patient's malignancy is difficult to perform accurately given extensive respiratory motion artifact, underlying parenchymal processes above, and the absence of images from the patient's most recent comparison exam dated 12/09/2020. 5. Aortic Atherosclerosis (ICD10-I70.0). Electronically Signed   By: Lovena Le M.D.   On: 01/12/2021 04:29   DG Chest Portable 1 View  Result Date: 01/12/2021 CLINICAL DATA:  Shortness of breath EXAM: PORTABLE CHEST 1 VIEW COMPARISON:  11/11/2020 FINDINGS: Right Port-A-Cath remains in place, unchanged. Heart is normal size. Diffuse interstitial prominence throughout the lungs, increasing since prior study. Left basilar airspace opacity. No effusions or acute bony abnormality. Old healed left 6th rib fracture. IMPRESSION: Diffuse interstitial prominence throughout the lungs. Focal left lower lobe airspace opacity. Findings could reflect pneumonia. Electronically Signed   By: Rolm Baptise M.D.   On: 01/12/2021 02:23   ECHOCARDIOGRAM COMPLETE  Result Date: 01/12/2021    ECHOCARDIOGRAM REPORT   Patient Name:   Cynthia Kelley Date of Exam: 01/12/2021 Medical Rec #:  [id number]  Height:       69.0 in Accession #:    [id number] Weight:       121.0 lb Date of Birth:  [birth year] BSA:          1.669 m Patient Age:    61 years   BP:           117/51 mmHg Patient Gender: F          HR:           87 bpm. Exam Location:  [facility] Procedure: 2D Echo, Cardiac Doppler and Color Doppler Indications:    Congestive Heart Failure I50.9  History:        Patient has no prior history of Echocardiogram examinations.                  CHF. Acute respiratory failure with hypoxia and hypercapnia                 ([place]), Alcohol dependence ([place]), Tobacco abuse, Lung cancer,                 primary, with metastasis from lung to other site [facility]).  Sonographer:    Alvino Chapel RCS Referring Phys: [id number] ASIA B [place]  1. Left ventricular ejection fraction, by estimation, is 55 to 60%. The left ventricle has normal function. The left ventricle has no regional wall motion abnormalities. There is mild left ventricular hypertrophy. Left ventricular diastolic parameters are consistent with Grade II diastolic dysfunction (pseudonormalization). Elevated left atrial pressure.  2. Right ventricular systolic function is normal. The right ventricular size is normal. There is severely elevated pulmonary artery systolic pressure.  3. Left atrial size was severely dilated.  4. Right atrial size was moderately dilated.  5. The mitral valve is abnormal. Mild to moderate mitral valve regurgitation. Mild to moderate mitral stenosis. Severe mitral annular calcification. The mean mitral valve gradient is 5.0 mmHg with average heart  rate of 88 bpm.  6. The aortic valve is tricuspid. Aortic valve regurgitation is not visualized.  7. Moderate to severe pulmonary HTN, PASP is 67 mmHg.  8. The inferior vena cava is dilated in size with <50% respiratory variability, suggesting right atrial pressure of 15 mmHg. FINDINGS  Left Ventricle: Left ventricular ejection fraction, by estimation, is 55 to 60%. The left ventricle has normal function. The left ventricle has no regional wall motion abnormalities. The left ventricular internal cavity size was normal in size. There is  mild left ventricular hypertrophy. Left ventricular diastolic parameters are consistent with Grade II diastolic dysfunction (pseudonormalization). Elevated left atrial pressure. Right Ventricle: The right ventricular size is normal. No increase in right ventricular wall thickness.  Right ventricular systolic function is normal. There is severely elevated pulmonary artery systolic pressure. The tricuspid regurgitant velocity is 3.61 m/s, and with an assumed right atrial pressure of 8 mmHg, the estimated right ventricular systolic pressure is 51.8 mmHg. Left Atrium: Left atrial size was severely dilated. Right Atrium: Right atrial size was moderately dilated. Pericardium: There is no evidence of pericardial effusion. Mitral Valve: The mitral valve is abnormal. There is moderate thickening of the mitral valve leaflet(s). There is moderate calcification of the mitral valve leaflet(s). Severe mitral annular calcification. Mild to moderate mitral valve regurgitation. Mild to moderate mitral valve stenosis. The mean mitral valve gradient is 5.0 mmHg with average heart rate of 88 bpm. Tricuspid Valve: The tricuspid valve is not well visualized. Tricuspid valve regurgitation is mild . No evidence of tricuspid stenosis. Aortic Valve: The aortic valve is tricuspid. There is mild aortic valve annular calcification. Aortic valve regurgitation is not visualized. Aortic valve mean gradient measures 3.9 mmHg. Aortic valve peak gradient measures 8.2 mmHg. Aortic valve area, by  VTI measures 2.06 cm. Pulmonic Valve: The pulmonic valve was not well visualized. Pulmonic valve regurgitation is not visualized. No evidence of pulmonic stenosis. Aorta: The aortic root is normal in size and structure. Pulmonary Artery: Moderate to severe pulmonary HTN, PASP is 67 mmHg. Venous: The inferior vena cava is dilated in size with less than 50% respiratory variability, suggesting right atrial pressure of 15 mmHg. IAS/Shunts: The interatrial septum was not well visualized.  LEFT VENTRICLE PLAX 2D LVIDd:         4.70 cm  Diastology LVIDs:         3.40 cm  LV e' medial:    6.53 cm/s LV PW:         1.10 cm  LV E/e' medial:  27.0 LV IVS:        1.00 cm  LV e' lateral:   8.49 cm/s LVOT diam:     1.60 cm  LV E/e' lateral: 20.7 LV  SV:         56 LV SV Index:   33 LVOT Area:     2.01 cm  RIGHT VENTRICLE RV S prime:     14.30 cm/s TAPSE (M-mode): 2.4 cm LEFT ATRIUM              Index       RIGHT ATRIUM           Index LA diam:        4.40 cm  2.64 cm/m  RA Area:     18.70 cm LA Vol (A2C):   116.0 ml 69.51 ml/m RA Volume:   52.10 ml  31.22 ml/m LA Vol (A4C):   93.8 ml  56.21 ml/m LA Biplane Vol:  108.0 ml 64.71 ml/m  AORTIC VALVE AV Area (Vmax):    1.71 cm AV Area (Vmean):   1.98 cm AV Area (VTI):     2.06 cm AV Vmax:           143.59 cm/s AV Vmean:          91.414 cm/s AV VTI:            0.270 m AV Peak Grad:      8.2 mmHg AV Mean Grad:      3.9 mmHg LVOT Vmax:         122.00 cm/s LVOT Vmean:        90.000 cm/s LVOT VTI:          0.277 m LVOT/AV VTI ratio: 1.02  AORTA Ao Root diam: 3.20 cm MITRAL VALVE                TRICUSPID VALVE MV Area (PHT): 3.43 cm     TR Peak grad:   52.1 mmHg MV Mean grad:  5.0 mmHg     TR Vmax:        361.00 cm/s MV Decel Time: 221 msec MV E velocity: 176.00 cm/s  SHUNTS MV A velocity: 127.00 cm/s  Systemic VTI:  0.28 m MV E/A ratio:  1.39         Systemic Diam: 1.60 cm Carlyle Dolly MD Electronically signed by Carlyle Dolly MD Signature Date/Time: 01/12/2021/3:17:25 PM    Final      Microbiology: Recent Results (from the past 240 hour(s))  Blood culture (routine x 2)     Status: None (Preliminary result)   Collection Time: 01/12/21  3:47 AM   Specimen: Left Antecubital; Blood  Result Value Ref Range Status   Specimen Description LEFT ANTECUBITAL  Final   Special Requests   Final    BOTTLES DRAWN AEROBIC AND ANAEROBIC Blood Culture adequate volume   Culture   Final    NO GROWTH 3 DAYS Performed at [facility], [address] [place], [place] [address]    Report Status PENDING  Incomplete  Blood culture (routine x 2)     Status: None (Preliminary result)   Collection Time: 01/12/21  3:47 AM   Specimen: BLOOD LEFT FOREARM  Result Value Ref Range Status   Specimen Description BLOOD  LEFT FOREARM  Final   Special Requests   Final    BOTTLES DRAWN AEROBIC AND ANAEROBIC Blood Culture results may not be optimal due to an inadequate volume of blood received in culture bottles   Culture   Final    NO GROWTH 3 DAYS Performed at [facility], [address] [place], [place] [address]    Report Status PENDING  Incomplete  Resp Panel by RT-PCR (Flu A&B, Covid) Nasopharyngeal Swab     Status: None   Collection Time: 01/12/21  5:06 AM   Specimen: Nasopharyngeal Swab; Nasopharyngeal(NP) swabs in vial transport medium  Result Value Ref Range Status   SARS Coronavirus 2 by RT PCR NEGATIVE NEGATIVE Final    Comment: (NOTE) SARS-CoV-2 target nucleic acids are NOT DETECTED.  The SARS-CoV-2 RNA is generally detectable in upper respiratory specimens during the acute phase of infection. The lowest concentration of SARS-CoV-2 viral copies this assay can detect is 138 copies/mL. A negative result does not preclude SARS-Cov-2 infection and should not be used as the sole basis for treatment or other patient management decisions. A negative result may occur with  improper specimen collection/handling, submission of specimen  other than nasopharyngeal swab, presence of viral mutation(s) within the areas targeted by this assay, and inadequate number of viral copies(<138 copies/mL). A negative result must be combined with clinical observations, patient history, and epidemiological information. The expected result is Negative.  Fact Sheet for Patients:  EntrepreneurPulse.com.au  Fact Sheet for Healthcare Providers:  IncredibleEmployment.be  This test is no t yet approved or cleared by the [place] FDA and  has been authorized for detection and/or diagnosis of SARS-CoV-2 by FDA under an Emergency Use Authorization (EUA). This EUA will remain  in effect (meaning this test can be used) for the duration of the COVID-19 declaration under Section  564(b)(1) of the Act, 21 U.S.C.section 360bbb-3(b)(1), unless the authorization is terminated  or revoked sooner.       Influenza A by PCR NEGATIVE NEGATIVE Final   Influenza B by PCR NEGATIVE NEGATIVE Final    Comment: (NOTE) The Xpert Xpress SARS-CoV-2/FLU/RSV plus assay is intended as an aid in the diagnosis of influenza from Nasopharyngeal swab specimens and should not be used as a sole basis for treatment. Nasal washings and aspirates are unacceptable for Xpert Xpress SARS-CoV-2/FLU/RSV testing.  Fact Sheet for Patients: EntrepreneurPulse.com.au  Fact Sheet for Healthcare Providers: IncredibleEmployment.be  This test is not yet approved or cleared by the [place] FDA and has been authorized for detection and/or diagnosis of SARS-CoV-2 by FDA under an Emergency Use Authorization (EUA). This EUA will remain in effect (meaning this test can be used) for the duration of the COVID-19 declaration under Section 564(b)(1) of the Act, 21 U.S.C. section 360bbb-3(b)(1), unless the authorization is terminated or revoked.  Performed at [facility], [address], [place], [place] [address]   MRSA PCR Screening     Status: None   Collection Time: 01/12/21  9:17 AM   Specimen: Nasal Mucosa; Nasopharyngeal  Result Value Ref Range Status   MRSA by PCR NEGATIVE NEGATIVE Final    Comment:        The GeneXpert MRSA Assay (FDA approved for NASAL specimens only), is one component of a comprehensive MRSA colonization surveillance program. It is not intended to diagnose MRSA infection nor to guide or monitor treatment for MRSA infections. Performed at [facility], [address], [place], [place] [address]      Labs: Basic Metabolic Panel: Recent Labs  Lab 01/12/21 0144 01/13/21 0403 01/14/21 1704 01/15/21 0543  NA 125* 128* 132* 134*  K 4.5 3.6 3.8 3.0*  CL 95* 93* 99 100  CO2 22 22 25 25   GLUCOSE 113* 127* 114* 93  BUN 7* 12 20 20    CREATININE 0.55 0.66 0.80 0.57  CALCIUM 8.6* 8.9 8.5* 8.3*  MG  --  2.0  --  2.2   Liver Function Tests: Recent Labs  Lab 01/12/21 0144 01/13/21 0403  AST 73* 34  ALT 34 31  ALKPHOS 92 118  BILITOT 1.4* 0.7  PROT 7.6 7.3  ALBUMIN 4.0 3.9   No results for input(s): LIPASE, AMYLASE in the last 168 hours. No results for input(s): AMMONIA in the last 168 hours. CBC: Recent Labs  Lab 01/12/21 0144 01/13/21 0403 01/14/21 1704 01/15/21 0543  WBC 45.5* 53.0* 68.5* 71.6*  NEUTROABS 41.9* 50.4*  --  60.7*  HGB 12.0 11.0* 11.4* 11.1*  HCT 35.5* 32.7* 33.5* 33.4*  MCV 111.3* 112.4* 111.3* 112.1*  PLT 219 162 130* 115*   Cardiac Enzymes: No results for input(s): CKTOTAL, CKMB, CKMBINDEX, TROPONINI in the last 168 hours. BNP: Invalid input(s):  POCBNP CBG: No results for input(s): GLUCAP in the last 168 hours.  Time coordinating discharge:  36 minutes  Signed:  Orson Eva, DO [facility] Pager: [phone] 01/15/2021, 8:41 AM

## 2021-01-16 ENCOUNTER — Telehealth: Payer: Self-pay

## 2021-01-16 NOTE — Telephone Encounter (Signed)
Transition Care Management Follow-up Telephone Call  Date of discharge and from where: 01/15/21 from [facility] ICU   How have you been since you were released from the hospital? Pt is doing well, slept better.   Any questions or concerns? No  Items Reviewed:  Did the pt receive and understand the discharge instructions provided? Yes   Medications obtained and verified? Yes   Other? No   Any new allergies since your discharge? No   Dietary orders reviewed? Yes  Do you have support at home? Yes   Home Care and Equipment/Supplies: Were home health services ordered? no If so, what is the name of the agency? NA   Has the agency set up a time to come to the patient's home? not applicable Were any new equipment or medical supplies ordered?  No What is the name of the medical supply agency? NA  Were you able to get the supplies/equipment? not applicable Do you have any questions related to the use of the equipment or supplies? No  Functional Questionnaire: (I = Independent and D = Dependent) ADLs: I   Bathing/Dressing- I  Meal Prep- I   Eating- I  Maintaining continence- I  Transferring/Ambulation- I  Managing Meds- I  Follow up appointments reviewed:   PCP Hospital f/u appt confirmed? Yes  Scheduled to see Dr. Posey Pronto on 01/29/21 @ 10:40am.  [facility] f/u appt confirmed? No .  Are transportation arrangements needed? No   If their condition worsens, is the pt aware to call PCP or go to the Emergency Dept.? Yes  Was the patient provided with contact information for the PCP's office or ED? Yes  Was to pt encouraged to call back with questions or concerns? Yes

## 2021-01-17 LAB — CULTURE, BLOOD (ROUTINE X 2)
Culture: NO GROWTH
Culture: NO GROWTH
Special Requests: ADEQUATE

## 2021-01-22 DIAGNOSIS — E876 Hypokalemia: Secondary | ICD-10-CM | POA: Diagnosis not present

## 2021-01-22 DIAGNOSIS — C3491 Malignant neoplasm of unspecified part of right bronchus or lung: Secondary | ICD-10-CM | POA: Diagnosis not present

## 2021-01-22 DIAGNOSIS — C799 Secondary malignant neoplasm of unspecified site: Secondary | ICD-10-CM | POA: Diagnosis not present

## 2021-01-22 DIAGNOSIS — C7951 Secondary malignant neoplasm of bone: Secondary | ICD-10-CM | POA: Diagnosis not present

## 2021-01-22 DIAGNOSIS — I5031 Acute diastolic (congestive) heart failure: Secondary | ICD-10-CM | POA: Diagnosis not present

## 2021-01-22 DIAGNOSIS — G893 Neoplasm related pain (acute) (chronic): Secondary | ICD-10-CM | POA: Diagnosis not present

## 2021-01-22 DIAGNOSIS — I483 Typical atrial flutter: Secondary | ICD-10-CM | POA: Diagnosis not present

## 2021-01-22 DIAGNOSIS — E871 Hypo-osmolality and hyponatremia: Secondary | ICD-10-CM | POA: Diagnosis not present

## 2021-01-22 DIAGNOSIS — F17210 Nicotine dependence, cigarettes, uncomplicated: Secondary | ICD-10-CM | POA: Diagnosis not present

## 2021-01-22 DIAGNOSIS — I4891 Unspecified atrial fibrillation: Secondary | ICD-10-CM | POA: Diagnosis not present

## 2021-01-27 DIAGNOSIS — I4819 Other persistent atrial fibrillation: Secondary | ICD-10-CM | POA: Diagnosis not present

## 2021-01-27 DIAGNOSIS — Z79899 Other long term (current) drug therapy: Secondary | ICD-10-CM | POA: Diagnosis not present

## 2021-01-27 DIAGNOSIS — Z7901 Long term (current) use of anticoagulants: Secondary | ICD-10-CM | POA: Diagnosis not present

## 2021-01-27 DIAGNOSIS — I5030 Unspecified diastolic (congestive) heart failure: Secondary | ICD-10-CM | POA: Diagnosis not present

## 2021-01-27 DIAGNOSIS — I4891 Unspecified atrial fibrillation: Secondary | ICD-10-CM | POA: Diagnosis not present

## 2021-01-27 DIAGNOSIS — Z85118 Personal history of other malignant neoplasm of bronchus and lung: Secondary | ICD-10-CM | POA: Diagnosis not present

## 2021-01-27 DIAGNOSIS — I110 Hypertensive heart disease with heart failure: Secondary | ICD-10-CM | POA: Diagnosis not present

## 2021-01-27 DIAGNOSIS — I4892 Unspecified atrial flutter: Secondary | ICD-10-CM | POA: Diagnosis not present

## 2021-01-29 ENCOUNTER — Ambulatory Visit: Payer: Medicare Other | Admitting: Internal Medicine

## 2021-01-29 DIAGNOSIS — Z95828 Presence of other vascular implants and grafts: Secondary | ICD-10-CM | POA: Diagnosis not present

## 2021-01-29 DIAGNOSIS — Z5111 Encounter for antineoplastic chemotherapy: Secondary | ICD-10-CM | POA: Diagnosis not present

## 2021-01-29 DIAGNOSIS — C3491 Malignant neoplasm of unspecified part of right bronchus or lung: Secondary | ICD-10-CM | POA: Diagnosis not present

## 2021-01-29 DIAGNOSIS — C787 Secondary malignant neoplasm of liver and intrahepatic bile duct: Secondary | ICD-10-CM | POA: Diagnosis not present

## 2021-01-29 DIAGNOSIS — C7951 Secondary malignant neoplasm of bone: Secondary | ICD-10-CM | POA: Diagnosis not present

## 2021-02-03 DIAGNOSIS — C3491 Malignant neoplasm of unspecified part of right bronchus or lung: Secondary | ICD-10-CM | POA: Diagnosis not present

## 2021-02-03 DIAGNOSIS — C3431 Malignant neoplasm of lower lobe, right bronchus or lung: Secondary | ICD-10-CM | POA: Diagnosis not present

## 2021-02-05 DIAGNOSIS — R1312 Dysphagia, oropharyngeal phase: Secondary | ICD-10-CM | POA: Diagnosis not present

## 2021-02-05 DIAGNOSIS — T82898D Other specified complication of vascular prosthetic devices, implants and grafts, subsequent encounter: Secondary | ICD-10-CM | POA: Diagnosis not present

## 2021-02-05 DIAGNOSIS — G3184 Mild cognitive impairment, so stated: Secondary | ICD-10-CM | POA: Diagnosis not present

## 2021-02-05 DIAGNOSIS — I679 Cerebrovascular disease, unspecified: Secondary | ICD-10-CM | POA: Diagnosis not present

## 2021-02-05 DIAGNOSIS — I483 Typical atrial flutter: Secondary | ICD-10-CM | POA: Diagnosis not present

## 2021-02-05 DIAGNOSIS — C3491 Malignant neoplasm of unspecified part of right bronchus or lung: Secondary | ICD-10-CM | POA: Diagnosis not present

## 2021-02-05 DIAGNOSIS — Z6820 Body mass index (BMI) 20.0-20.9, adult: Secondary | ICD-10-CM | POA: Diagnosis not present

## 2021-02-05 DIAGNOSIS — Z79899 Other long term (current) drug therapy: Secondary | ICD-10-CM | POA: Diagnosis not present

## 2021-02-05 DIAGNOSIS — E871 Hypo-osmolality and hyponatremia: Secondary | ICD-10-CM | POA: Diagnosis not present

## 2021-02-05 DIAGNOSIS — F17210 Nicotine dependence, cigarettes, uncomplicated: Secondary | ICD-10-CM | POA: Diagnosis not present

## 2021-02-05 DIAGNOSIS — G893 Neoplasm related pain (acute) (chronic): Secondary | ICD-10-CM | POA: Diagnosis not present

## 2021-02-05 DIAGNOSIS — C787 Secondary malignant neoplasm of liver and intrahepatic bile duct: Secondary | ICD-10-CM | POA: Diagnosis not present

## 2021-02-05 DIAGNOSIS — I4892 Unspecified atrial flutter: Secondary | ICD-10-CM | POA: Diagnosis not present

## 2021-02-05 DIAGNOSIS — I4891 Unspecified atrial fibrillation: Secondary | ICD-10-CM | POA: Diagnosis not present

## 2021-02-05 DIAGNOSIS — C799 Secondary malignant neoplasm of unspecified site: Secondary | ICD-10-CM | POA: Diagnosis not present

## 2021-02-05 DIAGNOSIS — R413 Other amnesia: Secondary | ICD-10-CM | POA: Diagnosis not present

## 2021-02-05 DIAGNOSIS — I10 Essential (primary) hypertension: Secondary | ICD-10-CM | POA: Diagnosis not present

## 2021-02-05 DIAGNOSIS — C7951 Secondary malignant neoplasm of bone: Secondary | ICD-10-CM | POA: Diagnosis not present

## 2021-02-05 DIAGNOSIS — Z95828 Presence of other vascular implants and grafts: Secondary | ICD-10-CM | POA: Diagnosis not present

## 2021-02-05 DIAGNOSIS — E876 Hypokalemia: Secondary | ICD-10-CM | POA: Diagnosis not present

## 2021-02-10 ENCOUNTER — Ambulatory Visit: Payer: Medicare Other | Admitting: Internal Medicine

## 2021-02-11 DIAGNOSIS — J449 Chronic obstructive pulmonary disease, unspecified: Secondary | ICD-10-CM | POA: Diagnosis not present

## 2021-02-11 DIAGNOSIS — F17210 Nicotine dependence, cigarettes, uncomplicated: Secondary | ICD-10-CM | POA: Diagnosis not present

## 2021-02-19 DIAGNOSIS — Z95828 Presence of other vascular implants and grafts: Secondary | ICD-10-CM | POA: Diagnosis not present

## 2021-02-19 DIAGNOSIS — C3491 Malignant neoplasm of unspecified part of right bronchus or lung: Secondary | ICD-10-CM | POA: Diagnosis not present

## 2021-02-19 DIAGNOSIS — Z5111 Encounter for antineoplastic chemotherapy: Secondary | ICD-10-CM | POA: Diagnosis not present

## 2021-02-19 DIAGNOSIS — C7951 Secondary malignant neoplasm of bone: Secondary | ICD-10-CM | POA: Diagnosis not present

## 2021-02-19 DIAGNOSIS — C787 Secondary malignant neoplasm of liver and intrahepatic bile duct: Secondary | ICD-10-CM | POA: Diagnosis not present

## 2021-02-26 DIAGNOSIS — C7951 Secondary malignant neoplasm of bone: Secondary | ICD-10-CM | POA: Diagnosis not present

## 2021-02-26 DIAGNOSIS — Z5111 Encounter for antineoplastic chemotherapy: Secondary | ICD-10-CM | POA: Diagnosis not present

## 2021-02-26 DIAGNOSIS — C787 Secondary malignant neoplasm of liver and intrahepatic bile duct: Secondary | ICD-10-CM | POA: Diagnosis not present

## 2021-02-26 DIAGNOSIS — C3491 Malignant neoplasm of unspecified part of right bronchus or lung: Secondary | ICD-10-CM | POA: Diagnosis not present

## 2021-02-26 DIAGNOSIS — Z95828 Presence of other vascular implants and grafts: Secondary | ICD-10-CM | POA: Diagnosis not present

## 2021-03-02 ENCOUNTER — Emergency Department (HOSPITAL_COMMUNITY): Payer: Medicare Other

## 2021-03-02 ENCOUNTER — Encounter (HOSPITAL_COMMUNITY): Payer: Self-pay | Admitting: Emergency Medicine

## 2021-03-02 ENCOUNTER — Inpatient Hospital Stay (HOSPITAL_COMMUNITY)
Admission: EM | Admit: 2021-03-02 | Discharge: 2021-03-06 | DRG: 643 | Disposition: A | Discharge status: Home / Self Care | Payer: Medicare Other | Attending: Internal Medicine | Admitting: Internal Medicine

## 2021-03-02 ENCOUNTER — Other Ambulatory Visit: Payer: Self-pay

## 2021-03-02 DIAGNOSIS — D539 Nutritional anemia, unspecified: Secondary | ICD-10-CM | POA: Diagnosis not present

## 2021-03-02 DIAGNOSIS — E860 Dehydration: Secondary | ICD-10-CM | POA: Diagnosis not present

## 2021-03-02 DIAGNOSIS — I5030 Unspecified diastolic (congestive) heart failure: Secondary | ICD-10-CM | POA: Diagnosis not present

## 2021-03-02 DIAGNOSIS — R079 Chest pain, unspecified: Secondary | ICD-10-CM

## 2021-03-02 DIAGNOSIS — F17210 Nicotine dependence, cigarettes, uncomplicated: Secondary | ICD-10-CM | POA: Diagnosis present

## 2021-03-02 DIAGNOSIS — Z7952 Long term (current) use of systemic steroids: Secondary | ICD-10-CM

## 2021-03-02 DIAGNOSIS — J9601 Acute respiratory failure with hypoxia: Secondary | ICD-10-CM | POA: Diagnosis not present

## 2021-03-02 DIAGNOSIS — Z20822 Contact with and (suspected) exposure to covid-19: Secondary | ICD-10-CM | POA: Diagnosis not present

## 2021-03-02 DIAGNOSIS — E876 Hypokalemia: Secondary | ICD-10-CM | POA: Diagnosis not present

## 2021-03-02 DIAGNOSIS — I5032 Chronic diastolic (congestive) heart failure: Secondary | ICD-10-CM | POA: Diagnosis present

## 2021-03-02 DIAGNOSIS — R6889 Other general symptoms and signs: Secondary | ICD-10-CM | POA: Diagnosis not present

## 2021-03-02 DIAGNOSIS — R319 Hematuria, unspecified: Secondary | ICD-10-CM | POA: Diagnosis not present

## 2021-03-02 DIAGNOSIS — F0390 Unspecified dementia without behavioral disturbance: Secondary | ICD-10-CM | POA: Diagnosis present

## 2021-03-02 DIAGNOSIS — D72829 Elevated white blood cell count, unspecified: Secondary | ICD-10-CM

## 2021-03-02 DIAGNOSIS — R062 Wheezing: Secondary | ICD-10-CM | POA: Diagnosis not present

## 2021-03-02 DIAGNOSIS — J811 Chronic pulmonary edema: Secondary | ICD-10-CM | POA: Diagnosis not present

## 2021-03-02 DIAGNOSIS — Z79899 Other long term (current) drug therapy: Secondary | ICD-10-CM | POA: Diagnosis not present

## 2021-03-02 DIAGNOSIS — J449 Chronic obstructive pulmonary disease, unspecified: Secondary | ICD-10-CM | POA: Diagnosis not present

## 2021-03-02 DIAGNOSIS — I110 Hypertensive heart disease with heart failure: Secondary | ICD-10-CM | POA: Diagnosis present

## 2021-03-02 DIAGNOSIS — D696 Thrombocytopenia, unspecified: Secondary | ICD-10-CM | POA: Diagnosis not present

## 2021-03-02 DIAGNOSIS — R9431 Abnormal electrocardiogram [ECG] [EKG]: Secondary | ICD-10-CM

## 2021-03-02 DIAGNOSIS — G4700 Insomnia, unspecified: Secondary | ICD-10-CM | POA: Diagnosis present

## 2021-03-02 DIAGNOSIS — D630 Anemia in neoplastic disease: Secondary | ICD-10-CM | POA: Diagnosis not present

## 2021-03-02 DIAGNOSIS — E222 Syndrome of inappropriate secretion of antidiuretic hormone: Principal | ICD-10-CM | POA: Diagnosis present

## 2021-03-02 DIAGNOSIS — I5033 Acute on chronic diastolic (congestive) heart failure: Secondary | ICD-10-CM | POA: Diagnosis not present

## 2021-03-02 DIAGNOSIS — C3490 Malignant neoplasm of unspecified part of unspecified bronchus or lung: Secondary | ICD-10-CM | POA: Diagnosis present

## 2021-03-02 DIAGNOSIS — F419 Anxiety disorder, unspecified: Secondary | ICD-10-CM | POA: Diagnosis present

## 2021-03-02 DIAGNOSIS — Z7189 Other specified counseling: Secondary | ICD-10-CM | POA: Diagnosis not present

## 2021-03-02 DIAGNOSIS — D649 Anemia, unspecified: Secondary | ICD-10-CM

## 2021-03-02 DIAGNOSIS — Z825 Family history of asthma and other chronic lower respiratory diseases: Secondary | ICD-10-CM

## 2021-03-02 DIAGNOSIS — E871 Hypo-osmolality and hyponatremia: Secondary | ICD-10-CM | POA: Diagnosis present

## 2021-03-02 DIAGNOSIS — C7951 Secondary malignant neoplasm of bone: Secondary | ICD-10-CM | POA: Diagnosis present

## 2021-03-02 DIAGNOSIS — I4819 Other persistent atrial fibrillation: Secondary | ICD-10-CM | POA: Diagnosis present

## 2021-03-02 DIAGNOSIS — Z792 Long term (current) use of antibiotics: Secondary | ICD-10-CM | POA: Diagnosis not present

## 2021-03-02 DIAGNOSIS — R0689 Other abnormalities of breathing: Secondary | ICD-10-CM | POA: Diagnosis not present

## 2021-03-02 DIAGNOSIS — Z7901 Long term (current) use of anticoagulants: Secondary | ICD-10-CM

## 2021-03-02 DIAGNOSIS — R0602 Shortness of breath: Secondary | ICD-10-CM | POA: Diagnosis not present

## 2021-03-02 DIAGNOSIS — C799 Secondary malignant neoplasm of unspecified site: Secondary | ICD-10-CM | POA: Diagnosis not present

## 2021-03-02 DIAGNOSIS — Z801 Family history of malignant neoplasm of trachea, bronchus and lung: Secondary | ICD-10-CM

## 2021-03-02 DIAGNOSIS — Z515 Encounter for palliative care: Secondary | ICD-10-CM | POA: Diagnosis not present

## 2021-03-02 DIAGNOSIS — R0600 Dyspnea, unspecified: Secondary | ICD-10-CM | POA: Diagnosis not present

## 2021-03-02 DIAGNOSIS — Z8249 Family history of ischemic heart disease and other diseases of the circulatory system: Secondary | ICD-10-CM

## 2021-03-02 DIAGNOSIS — Z743 Need for continuous supervision: Secondary | ICD-10-CM | POA: Diagnosis not present

## 2021-03-02 DIAGNOSIS — Z66 Do not resuscitate: Secondary | ICD-10-CM | POA: Diagnosis not present

## 2021-03-02 DIAGNOSIS — Z823 Family history of stroke: Secondary | ICD-10-CM

## 2021-03-02 DIAGNOSIS — R809 Proteinuria, unspecified: Secondary | ICD-10-CM | POA: Diagnosis not present

## 2021-03-02 DIAGNOSIS — IMO0002 Reserved for concepts with insufficient information to code with codable children: Secondary | ICD-10-CM | POA: Diagnosis present

## 2021-03-02 MED ORDER — ALBUTEROL SULFATE HFA 108 (90 BASE) MCG/ACT IN AERS: 2.0000 | INHALATION_SPRAY | RESPIRATORY_TRACT | Status: DC | PRN

## 2021-03-02 NOTE — ED Triage Notes (Signed)
Pt c/o sob that started tonight. EMS placed pt on 2lpm for comfort and gave her 1 albuterol treatment while enroute.

## 2021-03-03 DIAGNOSIS — E876 Hypokalemia: Secondary | ICD-10-CM | POA: Diagnosis present

## 2021-03-03 DIAGNOSIS — D539 Nutritional anemia, unspecified: Secondary | ICD-10-CM | POA: Diagnosis present

## 2021-03-03 DIAGNOSIS — J449 Chronic obstructive pulmonary disease, unspecified: Secondary | ICD-10-CM | POA: Diagnosis present

## 2021-03-03 DIAGNOSIS — Z515 Encounter for palliative care: Secondary | ICD-10-CM | POA: Diagnosis not present

## 2021-03-03 DIAGNOSIS — I5030 Unspecified diastolic (congestive) heart failure: Secondary | ICD-10-CM | POA: Diagnosis not present

## 2021-03-03 DIAGNOSIS — E871 Hypo-osmolality and hyponatremia: Secondary | ICD-10-CM | POA: Diagnosis present

## 2021-03-03 DIAGNOSIS — Z8249 Family history of ischemic heart disease and other diseases of the circulatory system: Secondary | ICD-10-CM | POA: Diagnosis not present

## 2021-03-03 DIAGNOSIS — I5033 Acute on chronic diastolic (congestive) heart failure: Secondary | ICD-10-CM | POA: Diagnosis present

## 2021-03-03 DIAGNOSIS — C799 Secondary malignant neoplasm of unspecified site: Secondary | ICD-10-CM

## 2021-03-03 DIAGNOSIS — C3490 Malignant neoplasm of unspecified part of unspecified bronchus or lung: Secondary | ICD-10-CM | POA: Diagnosis present

## 2021-03-03 DIAGNOSIS — I110 Hypertensive heart disease with heart failure: Secondary | ICD-10-CM | POA: Diagnosis present

## 2021-03-03 DIAGNOSIS — F0390 Unspecified dementia without behavioral disturbance: Secondary | ICD-10-CM | POA: Diagnosis present

## 2021-03-03 DIAGNOSIS — D696 Thrombocytopenia, unspecified: Secondary | ICD-10-CM

## 2021-03-03 DIAGNOSIS — Z801 Family history of malignant neoplasm of trachea, bronchus and lung: Secondary | ICD-10-CM | POA: Diagnosis not present

## 2021-03-03 DIAGNOSIS — E860 Dehydration: Secondary | ICD-10-CM | POA: Diagnosis present

## 2021-03-03 DIAGNOSIS — E222 Syndrome of inappropriate secretion of antidiuretic hormone: Secondary | ICD-10-CM | POA: Diagnosis present

## 2021-03-03 DIAGNOSIS — Z792 Long term (current) use of antibiotics: Secondary | ICD-10-CM | POA: Diagnosis not present

## 2021-03-03 DIAGNOSIS — Z7952 Long term (current) use of systemic steroids: Secondary | ICD-10-CM | POA: Diagnosis not present

## 2021-03-03 DIAGNOSIS — Z825 Family history of asthma and other chronic lower respiratory diseases: Secondary | ICD-10-CM | POA: Diagnosis not present

## 2021-03-03 DIAGNOSIS — C7951 Secondary malignant neoplasm of bone: Secondary | ICD-10-CM | POA: Diagnosis present

## 2021-03-03 DIAGNOSIS — R9431 Abnormal electrocardiogram [ECG] [EKG]: Secondary | ICD-10-CM

## 2021-03-03 DIAGNOSIS — F17210 Nicotine dependence, cigarettes, uncomplicated: Secondary | ICD-10-CM | POA: Diagnosis present

## 2021-03-03 DIAGNOSIS — D649 Anemia, unspecified: Secondary | ICD-10-CM

## 2021-03-03 DIAGNOSIS — D72829 Elevated white blood cell count, unspecified: Secondary | ICD-10-CM

## 2021-03-03 DIAGNOSIS — Z79899 Other long term (current) drug therapy: Secondary | ICD-10-CM | POA: Diagnosis not present

## 2021-03-03 DIAGNOSIS — J9601 Acute respiratory failure with hypoxia: Secondary | ICD-10-CM | POA: Diagnosis present

## 2021-03-03 DIAGNOSIS — Z823 Family history of stroke: Secondary | ICD-10-CM | POA: Diagnosis not present

## 2021-03-03 DIAGNOSIS — Z66 Do not resuscitate: Secondary | ICD-10-CM | POA: Diagnosis not present

## 2021-03-03 DIAGNOSIS — D630 Anemia in neoplastic disease: Secondary | ICD-10-CM | POA: Diagnosis present

## 2021-03-03 DIAGNOSIS — I4819 Other persistent atrial fibrillation: Secondary | ICD-10-CM | POA: Diagnosis present

## 2021-03-03 DIAGNOSIS — Z20822 Contact with and (suspected) exposure to covid-19: Secondary | ICD-10-CM | POA: Diagnosis present

## 2021-03-03 DIAGNOSIS — Z7189 Other specified counseling: Secondary | ICD-10-CM | POA: Diagnosis not present

## 2021-03-03 LAB — BASIC METABOLIC PANEL
Anion gap: 8 (ref 5–15)
Anion gap: 9 (ref 5–15)
BUN: 10 mg/dL (ref 8–23)
BUN: 7 mg/dL — ABNORMAL LOW (ref 8–23)
CO2: 20 mmol/L — ABNORMAL LOW (ref 22–32)
CO2: 22 mmol/L (ref 22–32)
Calcium: 8.1 mg/dL — ABNORMAL LOW (ref 8.9–10.3)
Calcium: 8.4 mg/dL — ABNORMAL LOW (ref 8.9–10.3)
Chloride: 88 mmol/L — ABNORMAL LOW (ref 98–111)
Chloride: 93 mmol/L — ABNORMAL LOW (ref 98–111)
Creatinine, Ser: 0.42 mg/dL — ABNORMAL LOW (ref 0.44–1.00)
Creatinine, Ser: 0.54 mg/dL (ref 0.44–1.00)
GFR, Estimated: >60 mL/min (ref >60)
GFR, Estimated: >60 mL/min (ref >60)
Glucose, Bld: 115 mg/dL — ABNORMAL HIGH (ref 70–99)
Glucose, Bld: 99 mg/dL (ref 70–99)
Potassium: 3.3 mmol/L — ABNORMAL LOW (ref 3.5–5.1)
Potassium: 3.9 mmol/L (ref 3.5–5.1)
Sodium: 118 mmol/L — CL (ref 135–145)
Sodium: 122 mmol/L — ABNORMAL LOW (ref 135–145)

## 2021-03-03 LAB — CBC WITH DIFFERENTIAL/PLATELET
Abs Immature Granulocytes: 0 10*3/uL (ref 0.00–0.07)
Basophils Absolute: 0 10*3/uL (ref 0.0–0.1)
Basophils Relative: 0 %
Eosinophils Absolute: 0.1 10*3/uL (ref 0.0–0.5)
Eosinophils Relative: 0 %
HCT: 28.4 % — ABNORMAL LOW (ref 36.0–46.0)
Hemoglobin: 10.2 g/dL — ABNORMAL LOW (ref 12.0–15.0)
Immature Granulocytes: 0 %
Lymphocytes Relative: 3 %
Lymphs Abs: 0.9 10*3/uL (ref 0.7–4.0)
MCH: 39.1 pg — ABNORMAL HIGH (ref 26.0–34.0)
MCHC: 35.9 g/dL (ref 30.0–36.0)
MCV: 108.8 fL — ABNORMAL HIGH (ref 80.0–100.0)
Monocytes Absolute: 0.3 10*3/uL (ref 0.1–1.0)
Monocytes Relative: 1 %
Neutro Abs: 27.4 10*3/uL — ABNORMAL HIGH (ref 1.7–7.7)
Neutrophils Relative %: 96 %
Platelets: 117 10*3/uL — ABNORMAL LOW (ref 150–400)
RBC: 2.61 MIL/uL — ABNORMAL LOW (ref 3.87–5.11)
RDW: 15.9 % — ABNORMAL HIGH (ref 11.5–15.5)
WBC: 28.7 10*3/uL — ABNORMAL HIGH (ref 4.0–10.5)
nRBC: 0 % (ref 0.0–0.2)

## 2021-03-03 LAB — URINALYSIS, ROUTINE W REFLEX MICROSCOPIC
Bilirubin Urine: NEGATIVE
Glucose, UA: NEGATIVE mg/dL
Ketones, ur: 20 mg/dL — AB
Leukocytes,Ua: NEGATIVE
Nitrite: NEGATIVE
Protein, ur: >=300 mg/dL — AB
RBC / HPF: >50 RBC/hpf — ABNORMAL HIGH (ref 0–5)
Specific Gravity, Urine: 1.013 (ref 1.005–1.030)
pH: 8 (ref 5.0–8.0)

## 2021-03-03 LAB — OSMOLALITY: Osmolality: 253 mOsm/kg — ABNORMAL LOW (ref 275–295)

## 2021-03-03 LAB — BRAIN NATRIURETIC PEPTIDE: B Natriuretic Peptide: 1352 pg/mL — ABNORMAL HIGH (ref 0.0–100.0)

## 2021-03-03 LAB — RESP PANEL BY RT-PCR (FLU A&B, COVID) ARPGX2
Influenza A by PCR: NEGATIVE
Influenza B by PCR: NEGATIVE
SARS Coronavirus 2 by RT PCR: NEGATIVE

## 2021-03-03 LAB — OSMOLALITY, URINE: Osmolality, Ur: 338 mOsm/kg (ref 300–900)

## 2021-03-03 LAB — PHOSPHORUS: Phosphorus: 2.5 mg/dL (ref 2.5–4.6)

## 2021-03-03 LAB — SODIUM, URINE, RANDOM: Sodium, Ur: 25 mmol/L

## 2021-03-03 LAB — MAGNESIUM: Magnesium: 1.5 mg/dL — ABNORMAL LOW (ref 1.7–2.4)

## 2021-03-03 LAB — GLUCOSE, CAPILLARY: Glucose-Capillary: 106 mg/dL — ABNORMAL HIGH (ref 70–99)

## 2021-03-03 LAB — CORTISOL: Cortisol, Plasma: 6.1 ug/dL

## 2021-03-03 MED ORDER — IPRATROPIUM-ALBUTEROL 0.5-2.5 (3) MG/3ML IN SOLN
3.0000 mL | Freq: Once | RESPIRATORY_TRACT | Status: AC
  Administered 2021-03-03: 3 mL via RESPIRATORY_TRACT
  Filled 2021-03-03: qty 3

## 2021-03-03 MED ORDER — CHLORHEXIDINE GLUCONATE CLOTH 2 % EX PADS
6.0000 | MEDICATED_PAD | Freq: Every day | CUTANEOUS | Status: DC
  Administered 2021-03-03 – 2021-03-06 (×4): 6 via TOPICAL

## 2021-03-03 MED ORDER — SODIUM CHLORIDE 0.9 % IV SOLN: Freq: Once | INTRAVENOUS | Status: AC

## 2021-03-03 MED ORDER — LORAZEPAM 1 MG PO TABS
1.0000 mg | ORAL_TABLET | Freq: Every day | ORAL | Status: DC
  Administered 2021-03-03 – 2021-03-05 (×3): 1 mg via ORAL
  Filled 2021-03-03 (×3): qty 1

## 2021-03-03 MED ORDER — ENOXAPARIN SODIUM 40 MG/0.4ML IJ SOSY: 40.0000 mg | PREFILLED_SYRINGE | INTRAMUSCULAR | Status: DC

## 2021-03-03 MED ORDER — MEMANTINE HCL 10 MG PO TABS
10.0000 mg | ORAL_TABLET | Freq: Every morning | ORAL | Status: DC
  Administered 2021-03-03 – 2021-03-06 (×4): 10 mg via ORAL
  Filled 2021-03-03 (×4): qty 1

## 2021-03-03 MED ORDER — AMIODARONE HCL 200 MG PO TABS
200.0000 mg | ORAL_TABLET | Freq: Every day | ORAL | Status: DC
  Administered 2021-03-03 – 2021-03-06 (×4): 200 mg via ORAL
  Filled 2021-03-03 (×4): qty 1

## 2021-03-03 MED ORDER — SODIUM CHLORIDE 0.9 % IV SOLN: INTRAVENOUS | Status: DC

## 2021-03-03 MED ORDER — POTASSIUM CHLORIDE CRYS ER 20 MEQ PO TBCR
20.0000 meq | EXTENDED_RELEASE_TABLET | Freq: Every day | ORAL | Status: DC
  Administered 2021-03-03 – 2021-03-06 (×4): 20 meq via ORAL
  Filled 2021-03-03 (×4): qty 1

## 2021-03-03 MED ORDER — METOPROLOL SUCCINATE ER 50 MG PO TB24
100.0000 mg | ORAL_TABLET | Freq: Every day | ORAL | Status: DC
  Administered 2021-03-03 – 2021-03-06 (×4): 100 mg via ORAL
  Filled 2021-03-03 (×4): qty 2

## 2021-03-03 MED ORDER — APIXABAN 5 MG PO TABS
5.0000 mg | ORAL_TABLET | Freq: Two times a day (BID) | ORAL | Status: DC
  Administered 2021-03-03 – 2021-03-06 (×7): 5 mg via ORAL
  Filled 2021-03-03 (×7): qty 1

## 2021-03-03 MED ORDER — POTASSIUM CHLORIDE CRYS ER 20 MEQ PO TBCR
40.0000 meq | EXTENDED_RELEASE_TABLET | Freq: Once | ORAL | Status: AC
  Administered 2021-03-03: 40 meq via ORAL
  Filled 2021-03-03: qty 2

## 2021-03-03 MED ORDER — MAGNESIUM SULFATE 2 GM/50ML IV SOLN
2.0000 g | Freq: Once | INTRAVENOUS | Status: AC
  Administered 2021-03-03: 2 g via INTRAVENOUS
  Filled 2021-03-03: qty 50

## 2021-03-03 NOTE — Progress Notes (Signed)
Patient seen and examined.  Admitted after midnight secondary to shortness of breath and generalized weakness.  Work-up demonstrating no acute cardiopulmonary process; significant hyponatremia, signs of dehydration with decrease chloride, potassium and magnesium level.  Patient denies chest pain and that he is no focal deficits of neurologic symptoms.  IV fluids has been initiated, diuretics has been held and will follow clinical response.  Patient has been admitted to stepdown.  Please refer to H&P written by Dr. Josephine Cables on 03/03/2021 for further info/details.  Plan: -IV fluid resuscitation challenge will be provided -Follow osmolality and serial basic metabolic panel to assess sodium level -Based on results and clinical response we might need to involve nephrology service. -Continue to follow electrolytes trend and further replete as needed. -will check cortisol level.   Barton Dubois MD [phone]

## 2021-03-03 NOTE — ED Provider Notes (Signed)
[facility] EMERGENCY DEPARTMENT Provider Note   CSN: [id number] Arrival date & time: 03/02/21  2323     History Chief Complaint  Patient presents with  . Shortness of Breath    Cynthia Kelley is a 65 y.o. female.  The history is provided by the patient.  Shortness of Breath Severity:  Moderate Onset quality:  Sudden Timing:  Constant Progression:  Improving Chronicity:  New Relieved by:  Oxygen (albuterol) Worsened by:  Nothing Associated symptoms: cough   Associated symptoms: no chest pain and no fever   Patient with history of lung cancer, COPD presents with shortness of breath. Patient reports she went to bed feeling at her baseline, she woke up feeling short of breath.  EMS was called and she was placed on oxygen albuterol and began to feel improved   She denies any fevers or vomiting.  No chest pain.  She has had cough without hemoptysis. No other acute complaints Patient reports she has small cell lung cancer Past Medical History:  Diagnosis Date  . Acute diastolic congestive heart failure, NYHA class 2 ([place]) 11/11/2020  . Anemia 11/22/2017  . Anxiety 11/19/2020  . Arthritis   . Asymptomatic PVD (peripheral vascular disease) ([place]) 12/14/2016   Suspected.  Hands hyperemic with decreased cap refill. ? History vasospasm  . COPD with acute exacerbation ([place]) 01/14/2021  . FRACTURE, TIBIAL PLATEAU 05/11/2010   Qualifier: Diagnosis of  By: Aline Brochure MD, Dorothyann Peng    . Lung cancer, primary, with metastasis from lung to other site [facility]) 01/16/2018  . Nuclear sclerotic cataract of both eyes 06/08/2018  . Osteopenia 05/15/2013  . Osteoporosis    osteopenia  . Tobacco abuse 12/14/2016    Patient Active Problem List   Diagnosis Date Noted  . COPD with acute exacerbation ([place]) 01/14/2021  . Acute on chronic diastolic CHF (congestive heart failure) ([place]) 01/14/2021  . Acute respiratory failure with hypoxia and hypercapnia ([place]) 01/12/2021  . Community acquired pneumonia 01/12/2021  .  Hyponatremia 01/12/2021  . Alcohol dependence ([place]) 01/12/2021  . Encounter to establish care 11/19/2020  . Anxiety 11/19/2020  . Diastolic congestive heart failure, NYHA class 2 ([place]) 11/11/2020  . Typical atrial flutter ([place]) 11/11/2020  . Oropharyngeal dysphagia 08/13/2020  . Screening for colorectal cancer 07/19/2018  . Dry eye syndrome of both eyes 06/08/2018  . Nuclear sclerotic cataract of both eyes 06/08/2018  . Posterior vitreous detachment of both eyes 06/08/2018  . Lung cancer, primary, with metastasis from lung to other site [facility]) 01/16/2018  . Osseous metastasis ([place]) 11/10/2017  . Metastatic squamous cell carcinoma ([place]) 10/20/2017  . Tobacco abuse 12/14/2016  . Osteopenia 05/15/2013  . KNEE, ARTHRITIS, DEGEN./OSTEO 05/11/2010    Past Surgical History:  Procedure Laterality Date  . FRACTURE SURGERY     torn ACL  . KNEE ARTHROSCOPY       OB History    Gravida  1   Para  1   Term      Preterm      AB      Living  1     SAB      IAB      Ectopic      Multiple      Live Births  1           Family History  Problem Relation Age of Onset  . Cancer Father        died in 64's everywhere  . Heart disease Mother   . Cancer Mother  lung  . Stroke Mother   . Asthma Daughter     Social History   Tobacco Use  . Smoking status: Current Every Day Smoker    Packs/day: 0.50    Years: 20.00    Pack years: 10.00    Types: Cigarettes    Start date: 09/28/1963  . Smokeless tobacco: Never Used  Vaping Use  . Vaping Use: Never used  Substance Use Topics  . Alcohol use: Yes    Alcohol/week: 20.0 standard drinks    Types: 20 Cans of beer per week    Comment: 3-4 beers a day  . Drug use: No    Home Medications Prior to Admission medications   Medication Sig Start Date End Date Taking? Authorizing Provider  amoxicillin-clavulanate (AUGMENTIN) 875-125 MG tablet Take 1 tablet by mouth every 12 (twelve) hours. 01/15/21   Orson Eva, MD   apixaban (ELIQUIS) 5 MG TABS tablet Take 5 mg by mouth 2 (two) times daily. 09/27/20   [provider]  furosemide (LASIX) 20 MG tablet Take 1 tablet (20 mg total) by mouth daily. 01/15/21   Orson Eva, MD  LORazepam (ATIVAN) 1 MG tablet Take 1 mg by mouth at bedtime.    [provider]  metoprolol (TOPROL-XL) 200 MG 24 hr tablet Take 200 mg by mouth daily. 01/02/21   [provider]  Multiple Vitamins-Minerals (MULTIVITAMIN WITH MINERALS) tablet Take 1 tablet by mouth daily.    [provider]  potassium chloride SA (K-DUR,KLOR-CON) 20 MEQ tablet Take 20 mEq by mouth daily.    [provider]  predniSONE (DELTASONE) 20 MG tablet Take 2 tablets (40 mg total) by mouth daily with breakfast. 01/15/21   Orson Eva, MD    Allergies    Patient has no known allergies.  Review of Systems   Review of Systems  Constitutional: Negative for fever.  Respiratory: Positive for cough and shortness of breath.   Cardiovascular: Negative for chest pain.  All other systems reviewed and are negative.   Physical Exam Updated Vital Signs BP (!) 169/74   Pulse 77   Temp 98.3 F (36.8 C) (Oral)   Resp 17   Ht 1.651 m (5\' 5" )   Wt 60.8 kg   SpO2 100%   BMI 22.30 kg/m   Physical Exam CONSTITUTIONAL: Chronically ill-appearing, no acute distress HEAD: Normocephalic/atraumatic EYES: EOMI/PERRL ENMT: Mucous membranes moist NECK: supple no meningeal signs SPINE/BACK:entire spine nontender CV: S1/S2 noted, no murmurs/rubs/gallops noted LUNGS: Coarse breath sounds bilaterally, no distress noted ABDOMEN: soft, nontender NEURO: Pt is awake/alert/appropriate, moves all extremitiesx4.  No facial droop.   EXTREMITIES: full ROM SKIN: warm, color normal PSYCH: no abnormalities of mood noted, alert and oriented to situation  ED Results / Procedures / Treatments   Labs (all labs ordered are listed, but only abnormal results are displayed) Labs Reviewed  BASIC  METABOLIC PANEL - Abnormal; Notable for the following components:      Result Value   Sodium 118 (*)    Potassium 3.3 (*)    Chloride 88 (*)    Glucose, Bld 115 (*)    Calcium 8.1 (*)    All other components within normal limits  CBC WITH DIFFERENTIAL/PLATELET - Abnormal; Notable for the following components:   WBC 28.7 (*)    RBC 2.61 (*)    Hemoglobin 10.2 (*)    HCT 28.4 (*)    MCV 108.8 (*)    MCH 39.1 (*)    RDW 15.9 (*)  Platelets 117 (*)    Neutro Abs 27.4 (*)    All other components within normal limits  BRAIN NATRIURETIC PEPTIDE - Abnormal; Notable for the following components:   B Natriuretic Peptide 1,352.0 (*)    All other components within normal limits  RESP PANEL BY RT-PCR (FLU A&B, COVID) ARPGX2  MAGNESIUM  PHOSPHORUS  URINALYSIS, ROUTINE W REFLEX MICROSCOPIC  OSMOLALITY, URINE    EKG EKG Interpretation  Date/Time:  Tuesday March 03 2021 00:07:44 EDT Ventricular Rate:  75 PR Interval:  175 QRS Duration: 97 QT Interval:  454 QTC Calculation: 508 R Axis:   71 Text Interpretation: Sinus rhythm Left atrial enlargement Prolonged QT interval Confirmed by Ripley Fraise [phone]) on 03/03/2021 2:38:43 AM   Radiology DG Chest 2 View  Result Date: 03/03/2021 CLINICAL DATA:  sob that started tonight. Pt states she received chemo x last Thursday and hasn't felt well since. Hx of stage 4 lung cancer EXAM: CHEST - 2 VIEW COMPARISON:  Chest x-ray 01/12/2021, CT chest 01/12/2021 FINDINGS: Right chest wall Port-A-Cath with tip overlying the expected region of the distal superior vena cava just proximal to the superior cavoatrial junction. The heart size and mediastinal contours unchanged. Aortic calcification. No focal consolidation. Redemonstration of coarsened with some nodular-like interstitial markings. No pleural effusion. No pneumothorax. No acute osseous abnormality.  Old healed left rib fracture. IMPRESSION: No active cardiopulmonary disease in a patient with known  stage IV lung cancer. Electronically Signed   By: Iven Finn M.D.   On: 03/03/2021 00:03    Procedures Procedures   Medications Ordered in ED Medications  albuterol (VENTOLIN HFA) 108 (90 Base) MCG/ACT inhaler 2 puff (has no administration in time range)  enoxaparin (LOVENOX) injection 40 mg (has no administration in time range)  ipratropium-albuterol (DUONEB) 0.5-2.5 (3) MG/3ML nebulizer solution 3 mL (3 mLs Nebulization Given 03/03/21 0400)    ED Course  I have reviewed the triage vital signs and the nursing notes.  Pertinent labs & imaging results that were available during my care of the patient were reviewed by me and considered in my medical decision making (see chart for details).    MDM Rules/Calculators/A&P                          Patient with history of lung cancer currently on chemotherapy at [facility] presents with shortness of breath.  This was already improved by the time of my evaluation She has some coarse breath sounds but is overall well-appearing. Labs reveal significant hyponatremia. Labs from May [facility] from [facility] reveal a sodium of 128.  There is an acute drop from prior. Per records, patient has a has squamous cell lung cancer. Patient would need to be admitted for acute hyponatremia.  Discussed Dr. Josephine Cables for admission Final Clinical Impression(s) / ED Diagnoses Final diagnoses:  Hyponatremia    Rx / DC Orders ED Discharge Orders    None       Ripley Fraise, MD 03/03/21 [phone]

## 2021-03-03 NOTE — ED Notes (Signed)
Critical Result: Na 118 Reported to Dr. Christy Gentles.

## 2021-03-03 NOTE — H&P (Signed)
History and Physical  Cynthia Kelley HER:[id number] DOB: September 18, 1956 DOA: 03/02/2021  Referring physician: Ripley Fraise, MD PCP: Lindell Spar, MD  Patient coming from: Home  Chief Complaint: Shortness of breath  HPI: Cynthia Kelley is a 65 y.o. female with medical history significant for metastatic squamous cell carcinoma with osseous metastasis, persistent atrial fibrillation and anxiety who presents to the emergency department due to sudden onset of shortness of breath which woke her up from sleep.  EMS was activated and on arrival of EMS team, patient was placed on albuterol and supplemental oxygen and she was taken to the ED for further evaluation and management.  She denies fever, chills, chest pain, nausea, vomiting.  She endorses a history of small cell lung cancer, though medical records from [facility] indicate that she has been following up with them due to metastatic squamous cell carcinoma.  ED Course:  In the emergency department, she was intermittently tachypneic, BP was 156/70, other vital signs were within normal range.  Work-up in the ED showed leukocytosis, macrocytic anemia, thrombocytopenia, hyponatremia, hypokalemia.  Influenza A, B, SARS coronavirus 2 was negative. Chest x-ray showed no active cardiopulmonary disease in a patient with known stage IV lung cancer Breathing treatment with DuoNeb and Ventolin were given.  Hospitalist was asked to admit patient for further evaluation and management.  Review of Systems: Constitutional: Negative for chills and fever.  HENT: Negative for ear pain and sore throat.   Eyes: Negative for pain and visual disturbance.  Respiratory: Positive for cough and shortness of breath.   Cardiovascular: Negative for chest pain and palpitations.  Gastrointestinal: Negative for abdominal pain and vomiting.  Endocrine: Negative for polyphagia and polyuria.  Genitourinary: Negative for decreased urine volume, dysuria, enuresis Musculoskeletal:  Negative for arthralgias and back pain.  Skin: Negative for color change and rash.  Allergic/Immunologic: Negative for immunocompromised state.  Neurological: Negative for tremors, syncope, speech difficulty Hematological: Does not bruise/bleed easily.  All other systems reviewed and are negative   Past Medical History:  Diagnosis Date  . Acute diastolic congestive heart failure, NYHA class 2 ([place]) 11/11/2020  . Anemia 11/22/2017  . Anxiety 11/19/2020  . Arthritis   . Asymptomatic PVD (peripheral vascular disease) ([place]) 12/14/2016   Suspected.  Hands hyperemic with decreased cap refill. ? History vasospasm  . COPD with acute exacerbation ([place]) 01/14/2021  . FRACTURE, TIBIAL PLATEAU 05/11/2010   Qualifier: Diagnosis of  By: Aline Brochure MD, Dorothyann Peng    . Lung cancer, primary, with metastasis from lung to other site [facility]) 01/16/2018  . Nuclear sclerotic cataract of both eyes 06/08/2018  . Osteopenia 05/15/2013  . Osteoporosis    osteopenia  . Tobacco abuse 12/14/2016   Past Surgical History:  Procedure Laterality Date  . FRACTURE SURGERY     torn ACL  . KNEE ARTHROSCOPY      Social History:  reports that she has been smoking cigarettes. She started smoking about 57 years ago. She has a 10.00 pack-year smoking history. She has never used smokeless tobacco. She reports current alcohol use of about 20.0 standard drinks of alcohol per week. She reports that she does not use drugs.   No Known Allergies  Family History  Problem Relation Age of Onset  . Cancer Father        died in 82's everywhere  . Heart disease Mother   . Cancer Mother        lung  . Stroke Mother   . Asthma Daughter  Prior to Admission medications   Medication Sig Start Date End Date Taking? Authorizing Provider  amoxicillin-clavulanate (AUGMENTIN) 875-125 MG tablet Take 1 tablet by mouth every 12 (twelve) hours. 01/15/21   Orson Eva, MD  apixaban (ELIQUIS) 5 MG TABS tablet Take 5 mg by mouth 2 (two) times daily.  09/27/20   [provider]  furosemide (LASIX) 20 MG tablet Take 1 tablet (20 mg total) by mouth daily. 01/15/21   Orson Eva, MD  LORazepam (ATIVAN) 1 MG tablet Take 1 mg by mouth at bedtime.    [provider]  metoprolol (TOPROL-XL) 200 MG 24 hr tablet Take 200 mg by mouth daily. 01/02/21   [provider]  Multiple Vitamins-Minerals (MULTIVITAMIN WITH MINERALS) tablet Take 1 tablet by mouth daily.    [provider]  potassium chloride SA (K-DUR,KLOR-CON) 20 MEQ tablet Take 20 mEq by mouth daily.    [provider]  predniSONE (DELTASONE) 20 MG tablet Take 2 tablets (40 mg total) by mouth daily with breakfast. 01/15/21   Orson Eva, MD    Physical Exam: BP (!) 169/74   Pulse 77   Temp 98.3 F (36.8 C) (Oral)   Resp 17   Ht 5\' 5"  (1.651 m)   Wt 60.8 kg   SpO2 100%   BMI 22.30 kg/m   . General: 65 y.o. year-old female well developed well nourished in no acute distress.  Alert and oriented x3. Marland Kitchen HEENT: Dry mouth, NCAT, EOMI . Neck: Supple, trachea medial . Cardiovascular: Regular rate and rhythm with no rubs or gallops.  No thyromegaly or JVD noted.  No lower extremity edema. 2/4 pulses in all 4 extremities. Marland Kitchen Respiratory: Bilateral coarse breath sounds on auscultation with no rales.  . Abdomen: Soft, nontender nondistended with normal bowel sounds x4 quadrants. . Muskuloskeletal: Port-A-Cath insertion site noted.  No cyanosis, clubbing or edema noted bilaterally . Neuro: CN II-XII intact, strength 5/5 x 4, sensation, reflexes intact . Skin: decreased skin turgor, no ulcerative lesions noted or rashes . Psychiatry: Mood is appropriate for condition and setting          Labs on Admission:  Basic Metabolic Panel: Recent Labs  Lab 03/03/21 0002  NA 118*  K 3.3*  CL 88*  CO2 22  GLUCOSE 115*  BUN 10  CREATININE 0.54  CALCIUM 8.1*   Liver Function Tests: No results for input(s): AST, ALT, ALKPHOS, BILITOT, PROT, ALBUMIN in the last  168 hours. No results for input(s): LIPASE, AMYLASE in the last 168 hours. No results for input(s): AMMONIA in the last 168 hours. CBC: Recent Labs  Lab 03/03/21 0002  WBC 28.7*  NEUTROABS 27.4*  HGB 10.2*  HCT 28.4*  MCV 108.8*  PLT 117*   Cardiac Enzymes: No results for input(s): CKTOTAL, CKMB, CKMBINDEX, TROPONINI in the last 168 hours.  BNP (last 3 results) Recent Labs    01/12/21 0145 03/03/21 0002  BNP 2,115.0* 1,352.0*    ProBNP (last 3 results) No results for input(s): PROBNP in the last 8760 hours.  CBG: No results for input(s): GLUCAP in the last 168 hours.  Radiological Exams on Admission: DG Chest 2 View  Result Date: 03/03/2021 CLINICAL DATA:  sob that started tonight. Pt states she received chemo x last Thursday and hasn't felt well since. Hx of stage 4 lung cancer EXAM: CHEST - 2 VIEW COMPARISON:  Chest x-ray 01/12/2021, CT chest 01/12/2021 FINDINGS: Right chest wall Port-A-Cath with tip overlying the expected region of the distal superior vena  cava just proximal to the superior cavoatrial junction. The heart size and mediastinal contours unchanged. Aortic calcification. No focal consolidation. Redemonstration of coarsened with some nodular-like interstitial markings. No pleural effusion. No pneumothorax. No acute osseous abnormality.  Old healed left rib fracture. IMPRESSION: No active cardiopulmonary disease in a patient with known stage IV lung cancer. Electronically Signed   By: Iven Finn M.D.   On: 03/03/2021 00:03    EKG: I independently viewed the EKG done and my findings are as followed: Normal sinus rhythm at a rate of 75 bpm and prolonged QTc (508 ms)  Assessment/Plan Present on Admission: . Hyponatremia . Metastatic squamous cell carcinoma ([place]) . Diastolic congestive heart failure, NYHA class 2 (HCC)  Principal Problem:   Hyponatremia Active Problems:   Metastatic squamous cell carcinoma (HCC)   Diastolic congestive heart failure, NYHA  class 2 (HCC)   Acute respiratory failure with hypoxia (HCC)   Hypokalemia   Leukocytosis   Macrocytic anemia   Thrombocytopenia (HCC)   Prolonged QT interval  Hyponatremia possibly secondary to multifactorial Na 118.  This may be due to dehydration and diuretic effect Patient thought that she has small cell carcinoma in which SIADH will be a differential diagnosis, but medical record indicates that she has squamous cell carcinoma IV hydration will be provided Continue to monitor sodium with serial BMPs Urine osmolality and urine sodium will be checked  Hypokalemia K+ 3.3; this will be replenished  Dehydration Continue gentle hydration  Acute respiratory failure with hypoxia The cause of patient's shortness of breath unknown, she thought it was possibly due to panic attack Patient's Wells score for PE = 1 (low risk), she is already on Eliquis for A. fib Patient has since been back to baseline without requiring any oxygen Continue Ventolin as needed  Leukocytosis (chronic) WBC 28.7; this was 71.6 on 01/15/2021  Macrocytic anemia MCV 108.8, folate and vitamin B12 level will be checked  Thrombocytopenia Platelets 117 (this has ranged within 115-130 in the last 1 month) Continue to monitor platelet levels  Prolonged QTc (508 ms) Avoid QT prolonging drugs Magnesium level will be checked  Diastolic CHF Echocardiography done on 01/12/2021 showed LVEF of 55 to 60%, LV as no RWMA.  Left ventricular diastolic parameters are consistent with grade 2 diastolic dysfunction (pseudonormalization) Continue total input/output, daily weights and fluid restriction Continue Cardiac diet   Persistent atrial fibrillation Continue Toprol and Eliquis (Please confirm current home dose of Toprol with Pharmacy; Patient wasn't sure. Currently started on 100 mg)  Insomnia Continue Ativan per home regimen  Metastatic squamous cell carcinoma Patient follows with [facility] hematology and  oncology   DVT prophylaxis: Eliquis  Code Status: Full code  Family Communication: None at bedside  Disposition Plan:  Patient is from:                        home Anticipated DC to:                   SNF or family members home Anticipated DC date:               2-3 days Anticipated DC barriers:          Patient requires inpatient management due to hyponatremia  Consults called: None  Admission status: Inpatient    Bernadette Hoit MD [facility]  03/03/2021, 5:45 AM

## 2021-03-03 NOTE — Progress Notes (Signed)
Pt got up to use the bathroom and pt got dizzy and diaphoretic. Pt temp and blood sugar were WNL. Pt BP reading high at 239/85(140). Pt said this has happened to her before. Possible panic attack per pt. Pt is back in bed with cool rag and took her pj pants off to cool off. Will recheck BP and reassess in a few minutes.

## 2021-03-03 NOTE — TOC Initial Note (Signed)
Transition of Care [facility]) - Initial/Assessment Note    Patient Details  Name: Cynthia Kelley MRN: [id number] Date of Birth: [birth year]  Transition of Care [facility]) CM/SW Contact:    Salome Arnt, Merlin Phone Number: 03/03/2021, 9:22 AM  Clinical Narrative:   Pt admitted with hyponatremia. TOC completed assessment due to high risk readmission score. Assessment completed with pt's husband, [name]. Pt's husband indicates he is retired and with pt around the clock at home. She is independent with ADLs. Pt is followed by [facility] hematology and oncology for metastatic squamous cell carcinoma. Pt's husband indicates she has 2 weeks of chemo and then 1 week off. He requests pt return home when medically stable and no needs reported at this time. TOC will continue to follow.                Expected Discharge Plan: Home/Self Care Barriers to Discharge: Continued Medical Work up   Patient Goals and CMS Choice Patient states their goals for this hospitalization and ongoing recovery are:: return home   Choice offered to / list presented to : Spouse  Expected Discharge Plan and Services Expected Discharge Plan: Home/Self Care In-house Referral: Clinical Social Work     Living arrangements for the past 2 months: Single Family Home                 DME Arranged: N/A DME Agency: NA                  Prior Living Arrangements/Services Living arrangements for the past 2 months: Single Family Home Lives with:: Spouse Patient language and need for interpreter reviewed:: Yes Do you feel safe going back to the place where you live?: Yes      Need for Family Participation in Patient Care: Yes (Comment)     Criminal Activity/Legal Involvement Pertinent to Current Situation/Hospitalization: No - Comment as needed  Activities of Daily Living      Permission Sought/Granted                  Emotional Assessment         Alcohol / Substance Use: Not Applicable Psych  Involvement: No (comment)  Admission diagnosis:  Hyponatremia [E87.1] Patient Active Problem List   Diagnosis Date Noted  . Hypokalemia 03/03/2021  . Leukocytosis 03/03/2021  . Macrocytic anemia 03/03/2021  . Thrombocytopenia ([place]) 03/03/2021  . Prolonged QT interval 03/03/2021  . Dehydration 03/03/2021  . COPD with acute exacerbation ([place]) 01/14/2021  . Acute on chronic diastolic CHF (congestive heart failure) ([place]) 01/14/2021  . Acute respiratory failure with hypoxia ([place]) 01/12/2021  . Community acquired pneumonia 01/12/2021  . Hyponatremia 01/12/2021  . Alcohol dependence ([place]) 01/12/2021  . Encounter to establish care 11/19/2020  . Anxiety 11/19/2020  . Diastolic congestive heart failure, NYHA class 2 ([place]) 11/11/2020  . Typical atrial flutter ([place]) 11/11/2020  . Oropharyngeal dysphagia 08/13/2020  . Screening for colorectal cancer 07/19/2018  . Dry eye syndrome of both eyes 06/08/2018  . Nuclear sclerotic cataract of both eyes 06/08/2018  . Posterior vitreous detachment of both eyes 06/08/2018  . Lung cancer, primary, with metastasis from lung to other site [facility]) 01/16/2018  . Osseous metastasis ([place]) 11/10/2017  . Metastatic squamous cell carcinoma ([place]) 10/20/2017  . Tobacco abuse 12/14/2016  . Osteopenia 05/15/2013  . KNEE, ARTHRITIS, DEGEN./OSTEO 05/11/2010   PCP:  Lindell Spar, MD Pharmacy:   [facility], [address]  [place] [address] Phone: [phone] Fax: [phone]     Social Determinants of Health (SDOH) Interventions    Readmission Risk Interventions Readmission Risk Prevention Plan 03/03/2021  Transportation Screening Complete  HRI or Home Care Consult Complete  Social Work Consult for [place] Planning/Counseling Complete  Palliative Care Screening Not Applicable  Medication Review Press photographer) Complete  Some recent data might be hidden

## 2021-03-04 ENCOUNTER — Inpatient Hospital Stay (HOSPITAL_COMMUNITY): Payer: Medicare Other

## 2021-03-04 DIAGNOSIS — Z7189 Other specified counseling: Secondary | ICD-10-CM

## 2021-03-04 DIAGNOSIS — Z515 Encounter for palliative care: Secondary | ICD-10-CM

## 2021-03-04 DIAGNOSIS — I5033 Acute on chronic diastolic (congestive) heart failure: Secondary | ICD-10-CM

## 2021-03-04 LAB — TROPONIN I (HIGH SENSITIVITY)
Troponin I (High Sensitivity): 11 ng/L (ref <18)
Troponin I (High Sensitivity): 12 ng/L (ref <18)

## 2021-03-04 LAB — CBC
HCT: 23.4 % — ABNORMAL LOW (ref 36.0–46.0)
Hemoglobin: 8.3 g/dL — ABNORMAL LOW (ref 12.0–15.0)
MCH: 39.7 pg — ABNORMAL HIGH (ref 26.0–34.0)
MCHC: 35.5 g/dL (ref 30.0–36.0)
MCV: 112 fL — ABNORMAL HIGH (ref 80.0–100.0)
Platelets: 75 10*3/uL — ABNORMAL LOW (ref 150–400)
RBC: 2.09 MIL/uL — ABNORMAL LOW (ref 3.87–5.11)
RDW: 15.9 % — ABNORMAL HIGH (ref 11.5–15.5)
WBC: 31.2 10*3/uL — ABNORMAL HIGH (ref 4.0–10.5)
nRBC: 0 % (ref 0.0–0.2)

## 2021-03-04 LAB — FOLATE: Folate: 8.8 ng/mL (ref >5.9)

## 2021-03-04 LAB — COMPREHENSIVE METABOLIC PANEL
ALT: 24 U/L (ref 0–44)
AST: 32 U/L (ref 15–41)
Albumin: 3.1 g/dL — ABNORMAL LOW (ref 3.5–5.0)
Alkaline Phosphatase: 96 U/L (ref 38–126)
Anion gap: 5 (ref 5–15)
BUN: 7 mg/dL — ABNORMAL LOW (ref 8–23)
CO2: 21 mmol/L — ABNORMAL LOW (ref 22–32)
Calcium: 7.7 mg/dL — ABNORMAL LOW (ref 8.9–10.3)
Chloride: 96 mmol/L — ABNORMAL LOW (ref 98–111)
Creatinine, Ser: 0.39 mg/dL — ABNORMAL LOW (ref 0.44–1.00)
GFR, Estimated: >60 mL/min (ref >60)
Glucose, Bld: 131 mg/dL — ABNORMAL HIGH (ref 70–99)
Potassium: 3.8 mmol/L (ref 3.5–5.1)
Sodium: 122 mmol/L — ABNORMAL LOW (ref 135–145)
Total Bilirubin: 0.9 mg/dL (ref 0.3–1.2)
Total Protein: 5.3 g/dL — ABNORMAL LOW (ref 6.5–8.1)

## 2021-03-04 LAB — VITAMIN B12: Vitamin B-12: 1003 pg/mL — ABNORMAL HIGH (ref 180–914)

## 2021-03-04 LAB — PROTIME-INR
INR: 1.6 — ABNORMAL HIGH (ref 0.8–1.2)
Prothrombin Time: 19.2 seconds — ABNORMAL HIGH (ref 11.4–15.2)

## 2021-03-04 LAB — MRSA PCR SCREENING: MRSA by PCR: NEGATIVE

## 2021-03-04 LAB — APTT: aPTT: 39 seconds — ABNORMAL HIGH (ref 24–36)

## 2021-03-04 MED ORDER — FUROSEMIDE 10 MG/ML IJ SOLN
20.0000 mg | Freq: Once | INTRAMUSCULAR | Status: AC
  Administered 2021-03-04: 20 mg via INTRAVENOUS
  Filled 2021-03-04: qty 2

## 2021-03-04 MED ORDER — NITROGLYCERIN IN D5W 200-5 MCG/ML-% IV SOLN
0.0000 ug/min | INTRAVENOUS | Status: DC
  Administered 2021-03-04: 5 ug/min via INTRAVENOUS
  Administered 2021-03-04: 200 ug/min via INTRAVENOUS
  Filled 2021-03-04 (×2): qty 250

## 2021-03-04 MED ORDER — ACETAMINOPHEN 325 MG PO TABS
650.0000 mg | ORAL_TABLET | Freq: Four times a day (QID) | ORAL | Status: DC | PRN
  Administered 2021-03-04 – 2021-03-05 (×3): 650 mg via ORAL
  Filled 2021-03-04 (×4): qty 2

## 2021-03-04 MED ORDER — TRAZODONE HCL 50 MG PO TABS
50.0000 mg | ORAL_TABLET | Freq: Every day | ORAL | Status: AC
  Administered 2021-03-04: 50 mg via ORAL
  Filled 2021-03-04: qty 1

## 2021-03-04 MED ORDER — SODIUM CHLORIDE 0.9% FLUSH
3.0000 mL | Freq: Two times a day (BID) | INTRAVENOUS | Status: DC
  Administered 2021-03-04 – 2021-03-06 (×5): 3 mL via INTRAVENOUS

## 2021-03-04 MED ORDER — SODIUM CHLORIDE 0.9 % IV SOLN: 250.0000 mL | INTRAVENOUS | Status: DC | PRN

## 2021-03-04 MED ORDER — ISOSORBIDE MONONITRATE ER 30 MG PO TB24
15.0000 mg | ORAL_TABLET | Freq: Every day | ORAL | Status: DC
  Administered 2021-03-05: 15 mg via ORAL
  Filled 2021-03-04: qty 1

## 2021-03-04 MED ORDER — SODIUM CHLORIDE 0.9% FLUSH
3.0000 mL | INTRAVENOUS | Status: DC | PRN
Start: 2021-03-04 — End: 2021-03-06

## 2021-03-04 MED ORDER — ISOSORBIDE MONONITRATE ER 30 MG PO TB24
30.0000 mg | ORAL_TABLET | Freq: Every day | ORAL | Status: DC
  Administered 2021-03-04: 30 mg via ORAL
  Filled 2021-03-04: qty 1

## 2021-03-04 MED ORDER — HYDRALAZINE HCL 20 MG/ML IJ SOLN
10.0000 mg | Freq: Three times a day (TID) | INTRAMUSCULAR | Status: DC | PRN
  Administered 2021-03-06: 10 mg via INTRAVENOUS
  Filled 2021-03-04: qty 1

## 2021-03-04 MED ORDER — GUAIFENESIN ER 600 MG PO TB12
1200.0000 mg | ORAL_TABLET | Freq: Two times a day (BID) | ORAL | Status: DC | PRN
  Administered 2021-03-04: 1200 mg via ORAL
  Filled 2021-03-04: qty 2

## 2021-03-04 NOTE — Progress Notes (Signed)
Patient declines BIPAP at this time and is not needed. O2 sats 96% on room air and in no distress. Unit still at bedside if needed.

## 2021-03-04 NOTE — Progress Notes (Addendum)
Was called by RN to come help with ECG for patient; report was not carrying over to Epic.  Dr. Clearence Ped had come to ICU to assess patient and see ECG results.  Dr. Clearence Ped asked about placing patient on Bipap after viewing CXR that showed some congestion.  Placed patient on Bipap at 10/5, R12, 40%.  Patient sat is currently 100%.  Patient's BS currently expiratory wheeze.  Will continue to monitor patient.

## 2021-03-04 NOTE — Progress Notes (Signed)
Patient seen and evaluated at bedside. SBPs 130s. New O2 requirement. Diaphoretic. Patient reports that she feels "funny." No chest pain. Reports she doesn't know how to describe what she is feeling. She does endorse dyspnea. CXR shows vascular congestion (official read pending). BiPAP and nitro ordered. No lasix ordered as patient is being treated for hyponatremia. Fluids stopped at this time.

## 2021-03-04 NOTE — Consult Note (Signed)
Consultation Note Date: 03/04/2021   Patient Name: Cynthia Kelley  DOB: 1956/06/05  MRN: [id number]  Age / Sex: 65 y.o., female  PCP: Lindell Spar, MD Referring Physician: Barton Dubois, MD  Reason for Consultation: Establishing goals of care  HPI/Patient Profile: 65 y.o. female  with past medical history of stage 4 small cell lung cancer, atrial fibrillation, anxiety admitted on 03/02/2021 with shortness of breath and found to have hyponatremia. Treated with IV fluids but then with overload and requiring transient BiPAP and Lasix.   Clinical Assessment and Goals of Care: I met today with Cynthia Kelley and her husband, [name], at bedside. Cynthia Kelley is sitting up in bed and on nasal cannula. She had a very difficult night last night but is feeling better today. She expresses that she is exhausted and expresses frustration with ongoing health issues. She is tired of being sick and tired of having these complications and treatments.   She says "I'm so tired of all this." I explored further with Cynthia Kelley what she really means by this. Georgette has a difficult time answering this question but [name] understands and acknowledges that these episodes are very difficult but when she is not in the hospital she gets by fairly well at home but with less reserve and unable to do a lot that she used to enjoy. They are very aware of prognosis and expectations. She finished her last chemotherapy last week and plans to stop and re-evaluate and give her a break. They will follow up with oncology for future plans.   We also discussed ongoing palliative support in the home which [name] feels would be very helpful. I spoke with them about code status and they confirm desire for DNR. [name] shares that she would not want life support and if nearing that direction they would opt for help from hospice instead. They both found it helpful to express some of their  frustrations with Cynthia Kelley's illness and how this effects them both.   All questions/concerns addressed. Emotional support provided.   Primary Decision Maker PATIENT and husband (patient with poor short term memory so husband needs to be involved in all decision making)    SUMMARY OF RECOMMENDATIONS   - Continue treatments with hopes of improvement and hopes to prevent these episodes as much as possible - DNR confirmed - Follow up with oncology for future treatment options  Code Status/Advance Care Planning:  DNR   Symptom Management:   Congestion: Adding guaifenesin as needed. Flutter valve.   Palliative Prophylaxis:   Aspiration, Bowel Regimen and Delirium Protocol  Prognosis:   Overall poor with advanced cancer but still fairly good functional status.   Discharge Planning: Home with Palliative Services      Primary Diagnoses: Present on Admission: . Hyponatremia . Metastatic squamous cell carcinoma ([place]) . Diastolic congestive heart failure, NYHA class 2 ([place])   I have reviewed the medical record, interviewed the patient and family, and examined the patient. The following aspects are pertinent.  Past Medical History:  Diagnosis Date  .  Acute diastolic congestive heart failure, NYHA class 2 ([place]) 11/11/2020  . Anemia 11/22/2017  . Anxiety 11/19/2020  . Arthritis   . Asymptomatic PVD (peripheral vascular disease) ([place]) 12/14/2016   Suspected.  Hands hyperemic with decreased cap refill. ? History vasospasm  . COPD with acute exacerbation ([place]) 01/14/2021  . FRACTURE, TIBIAL PLATEAU 05/11/2010   Qualifier: Diagnosis of  By: Aline Brochure MD, Dorothyann Peng    . Lung cancer, primary, with metastasis from lung to other site [facility]) 01/16/2018  . Nuclear sclerotic cataract of both eyes 06/08/2018  . Osteopenia 05/15/2013  . Osteoporosis    osteopenia  . Tobacco abuse 12/14/2016   Social History   Socioeconomic History  . Marital status: Married    Spouse name: [name]  . Number of  children: 1  . Years of education: 55  . Highest education level: Not on file  Occupational History  . Occupation: Air traffic controller    Comment: retired  Tobacco Use  . Smoking status: Current Every Day Smoker    Packs/day: 0.50    Years: 20.00    Pack years: 10.00    Types: Cigarettes    Start date: 09/28/1963  . Smokeless tobacco: Never Used  Vaping Use  . Vaping Use: Never used  Substance and Sexual Activity  . Alcohol use: Yes    Alcohol/week: 20.0 standard drinks    Types: 20 Cans of beer per week    Comment: 3-4 beers a day  . Drug use: No  . Sexual activity: Yes    Birth control/protection: Post-menopausal  Other Topics Concern  . Not on file  Social History Narrative   Retired Air traffic controller   Lives with husband [name]   Daughter Marcelino Scot is in [place] to walk   Social Determinants of Radio broadcast assistant Strain: Not on [employer] Insecurity: Not on file  Transportation Needs: Not on file  Physical Activity: Not on file  Stress: Not on file  Social Connections: Not on file   Family History  Problem Relation Age of Onset  . Cancer Father        died in 76's everywhere  . Heart disease Mother   . Cancer Mother        lung  . Stroke Mother   . Asthma Daughter    Scheduled Meds: . amiodarone  200 mg Oral Daily  . apixaban  5 mg Oral BID  . Chlorhexidine Gluconate Cloth  6 each Topical Daily  . isosorbide mononitrate  30 mg Oral Daily  . LORazepam  1 mg Oral QHS  . memantine  10 mg Oral q morning  . metoprolol succinate  100 mg Oral Daily  . potassium chloride SA  20 mEq Oral Daily  . sodium chloride flush  3 mL Intravenous Q12H   Continuous Infusions: . sodium chloride     PRN Meds:.sodium chloride, acetaminophen, albuterol, hydrALAZINE, sodium chloride flush No Known Allergies Review of Systems  Constitutional: Positive for activity change and appetite change.  Respiratory: Positive for shortness of breath.   Neurological: Positive for weakness.     Physical Exam Vitals and nursing note reviewed.  Constitutional:      General: She is not in acute distress. Cardiovascular:     Rate and Rhythm: Tachycardia present. Rhythm irregularly irregular.  Pulmonary:     Effort: No tachypnea, accessory muscle usage or respiratory distress.  Neurological:     Mental Status: She is alert and oriented to person, place, and  time.     Comments: Poor short term memory     Vital Signs: BP (!) 123/59   Pulse (!) 122   Temp (!) 97.5 F (36.4 C) (Axillary)   Resp (!) 25   Ht 5' 6"  (1.676 m)   Wt 69.5 kg   SpO2 95%   BMI 24.73 kg/m  Pain Scale: 0-10 POSS *See Group Information*: 1-Acceptable,Awake and alert Pain Score: 3    SpO2: SpO2: 95 % O2 Device:SpO2: 95 % O2 Flow Rate: .   IO: Intake/output summary:   Intake/Output Summary (Last 24 hours) at 03/04/2021 1216 Last data filed at 03/04/2021 1006 Gross per 24 hour  Intake 2016.49 ml  Output --  Net 2016.49 ml    LBM: Last BM Date: 03/04/21 Baseline Weight: Weight: 60.8 kg Most recent weight: Weight: 69.5 kg     Palliative Assessment/Data:     Time In: 1100 Time Out: 1215 Time Total: 75 min Greater than 50%  of this time was spent counseling and coordinating care related to the above assessment and plan.  Signed by: Vinie Sill, NP Palliative Medicine Team Pager # [phone] (M-F 8a-5p) Team Phone # [phone] (Nights/Weekends)

## 2021-03-04 NOTE — Progress Notes (Signed)
PROGRESS NOTE    Cynthia Kelley  WSF:[id number] DOB: Feb 17, 1956 DOA: 03/02/2021 PCP: Lindell Spar, MD   Chief Complaint  Patient presents with  . Shortness of Breath    Brief admission narrative:  Cynthia Kelley is a 65 y.o. female with medical history significant for metastatic squamous cell carcinoma with osseous metastasis, persistent atrial fibrillation and anxiety who presents to the emergency department due to sudden onset of shortness of breath which woke her up from sleep.  EMS was activated and on arrival of EMS team, patient was placed on albuterol and supplemental oxygen and she was taken to the ED for further evaluation and management.  She denies fever, chills, chest pain, nausea, vomiting.  She endorses a history of small cell lung cancer, though medical records from [facility] indicate that she has been following up with them due to metastatic squamous cell carcinoma.  ED Course:  In the emergency department, she was intermittently tachypneic, BP was 156/70, other vital signs were within normal range.  Work-up in the ED showed leukocytosis, macrocytic anemia, thrombocytopenia, hyponatremia, hypokalemia.  Influenza A, B, SARS coronavirus 2 was negative. Chest x-ray showed no active cardiopulmonary disease in a patient with known stage IV lung cancer Breathing treatment with DuoNeb and Ventolin were given.  Hospitalist was asked to admit patient for further evaluation and management.  Assessment & Plan: 1-hyponatremia -In the setting of prerenal azotemia most likely with recent decreased oral intake after chemotherapy treatment and continue use of diuretics. -Fluid resuscitation provided with partial improvement; sodium 122 currently -Patient experiencing vascular congestion and shortness of breath -IV fluids currently held. -Lasix x1 dose will be given -Nephrology service has been consulted for assistance with treatment of hyponatremia -Continue to follow daily weights, strict  I's and O's and urine output. -Continue oxygen supplementation and wean off as tolerated.  2-Metastatic squamous cell carcinoma ([place]) -Continue outpatient follow-up with oncology service  3-acute on chronic diastolic congestive heart failure, NYHA class 2 ([place]) -In the setting of fluid resuscitation as part of treatment for hyponatremia -IV Lasix will be provided -Continue to follow daily weights and strict intake and output. -imdur has been added, continue b-blocker.  4-Acute respiratory failure with hypoxia ([facility]) -In the setting of vascular congestion and heart failure exacerbation -Continue treatment as mentioned above and wean oxygen supplementation as tolerated -Patient continues to smoke and has an underlying history of stage IV lung cancer most likely also contributing.  5-Hypokalemia -In the setting of diuresis and decreased oral intake -Follow electrolytes and further replete as needed.  6-paroxysmal atrial fibrillation -Continue amiodarone and metoprolol -Continue Eliquis for secondary prevention.   DVT prophylaxis: Chronically on Eliquis. Code Status: DNR Family Communication: Husband at bedside Disposition:   Status is: Inpatient  Dispo: The patient is from: Home              Anticipated d/c is to: Home              Patient currently currently no medically stable for discharge; experiencing vascular congestion and is still ongoing hyponatremia process.   Difficult to place patient no    Consultants:   Palliative care  Nephrology service   Procedures:  See below for x-ray reports   Antimicrobials:  None   Subjective: No fever, no chest pain, no nausea vomiting currently.  Patient reports feeling slightly short of breath and having congestion.  Objective: Vitals:   03/04/21 1400 03/04/21 1430 03/04/21 1500 03/04/21 1642  BP: (!) 109/52 (!) 135/58 Marland Kitchen)  118/51   Pulse: 80 79 76 84  Resp:      Temp:    98 F (36.7 C)  TempSrc:    Oral  SpO2: 96%  100% 100% 95%  Weight:      Height:        Intake/Output Summary (Last 24 hours) at 03/04/2021 1715 Last data filed at 03/04/2021 1006 Gross per 24 hour  Intake 1233.81 ml  Output --  Net 1233.81 ml   Filed Weights   03/02/21 2325 03/03/21 1231 03/04/21 0412  Weight: 60.8 kg 60.6 kg 69.5 kg    Examination:  General exam: Febrile, no chest pain, no palpitations, no abdominal pain, no nausea or vomiting.  Reports feeling slightly short of breath and congested. Respiratory system: Decreased breath sounds at the bases; fine crackles auscultated.  No wheezing, no using accessory muscles.   Cardiovascular system: S1 & S2 heard, RRR. No JVD, murmurs, rubs, gallops or clicks. No pedal edema. Gastrointestinal system: Abdomen is nondistended, soft and nontender. No organomegaly or masses felt. Normal bowel sounds heard. Central nervous system: Alert and oriented. No focal neurological deficits. Extremities: No Cyanosis or clubbing. Skin: No petechiae. Psychiatry: Judgement and insight appear normal. Mood & affect appropriate.     Data Reviewed: I have personally reviewed following labs and imaging studies  CBC: Recent Labs  Lab 03/03/21 0002 03/04/21 0419  WBC 28.7* 31.2*  NEUTROABS 27.4*  --   HGB 10.2* 8.3*  HCT 28.4* 23.4*  MCV 108.8* 112.0*  PLT 117* 75*    Basic Metabolic Panel: Recent Labs  Lab 03/03/21 0002 03/03/21 1201 03/04/21 0250  NA 118* 122* 122*  K 3.3* 3.9 3.8  CL 88* 93* 96*  CO2 22 20* 21*  GLUCOSE 115* 99 131*  BUN 10 7* 7*  CREATININE 0.54 0.42* 0.39*  CALCIUM 8.1* 8.4* 7.7*  MG 1.5*  --   --   PHOS 2.5  --   --    GFR: Estimated Creatinine Clearance: 64.8 mL/min (A) (by C-G formula based on SCr of 0.39 mg/dL (L)).  Liver Function Tests: Recent Labs  Lab 03/04/21 0250  AST 32  ALT 24  ALKPHOS 96  BILITOT 0.9  PROT 5.3*  ALBUMIN 3.1*    CBG: Recent Labs  Lab 03/03/21 2302  GLUCAP 106*    Recent Results (from the past 240  hour(s))  Resp Panel by RT-PCR (Flu A&B, Covid) Nasopharyngeal Swab     Status: None   Collection Time: 03/03/21  3:18 AM   Specimen: Nasopharyngeal Swab; Nasopharyngeal(NP) swabs in vial transport medium  Result Value Ref Range Status   SARS Coronavirus 2 by RT PCR NEGATIVE NEGATIVE Final    Comment: (NOTE) SARS-CoV-2 target nucleic acids are NOT DETECTED.  The SARS-CoV-2 RNA is generally detectable in upper respiratory specimens during the acute phase of infection. The lowest concentration of SARS-CoV-2 viral copies this assay can detect is 138 copies/mL. A negative result does not preclude SARS-Cov-2 infection and should not be used as the sole basis for treatment or other patient management decisions. A negative result may occur with  improper specimen collection/handling, submission of specimen other than nasopharyngeal swab, presence of viral mutation(s) within the areas targeted by this assay, and inadequate number of viral copies(<138 copies/mL). A negative result must be combined with clinical observations, patient history, and epidemiological information. The expected result is Negative.  Fact Sheet for Patients:  EntrepreneurPulse.com.au  Fact Sheet for Healthcare Providers:  IncredibleEmployment.be  This test is no  t yet approved or cleared by the [place] and  has been authorized for detection and/or diagnosis of SARS-CoV-2 by FDA under an Emergency Use Authorization (EUA). This EUA will remain  in effect (meaning this test can be used) for the duration of the COVID-19 declaration under Section 564(b)(1) of the Act, 21 U.S.C.section 360bbb-3(b)(1), unless the authorization is terminated  or revoked sooner.       Influenza A by PCR NEGATIVE NEGATIVE Final   Influenza B by PCR NEGATIVE NEGATIVE Final    Comment: (NOTE) The Xpert Xpress SARS-CoV-2/FLU/RSV plus assay is intended as an aid in the diagnosis of influenza from  Nasopharyngeal swab specimens and should not be used as a sole basis for treatment. Nasal washings and aspirates are unacceptable for Xpert Xpress SARS-CoV-2/FLU/RSV testing.  Fact Sheet for Patients: EntrepreneurPulse.com.au  Fact Sheet for Healthcare Providers: IncredibleEmployment.be  This test is not yet approved or cleared by the [place] FDA and has been authorized for detection and/or diagnosis of SARS-CoV-2 by FDA under an Emergency Use Authorization (EUA). This EUA will remain in effect (meaning this test can be used) for the duration of the COVID-19 declaration under Section 564(b)(1) of the Act, 21 U.S.C. section 360bbb-3(b)(1), unless the authorization is terminated or revoked.  Performed at [facility], [address], [place], [place] [address]   MRSA PCR Screening     Status: None   Collection Time: 03/04/21 12:30 AM   Specimen: Nasopharyngeal  Result Value Ref Range Status   MRSA by PCR NEGATIVE NEGATIVE Final    Comment:        The GeneXpert MRSA Assay (FDA approved for NASAL specimens only), is one component of a comprehensive MRSA colonization surveillance program. It is not intended to diagnose MRSA infection nor to guide or monitor treatment for MRSA infections. Performed at [facility], [address], [place], [place] [address]      Radiology Studies: DG Chest 2 View  Result Date: 03/03/2021 CLINICAL DATA:  sob that started tonight. Pt states she received chemo x last Thursday and hasn't felt well since. Hx of stage 4 lung cancer EXAM: CHEST - 2 VIEW COMPARISON:  Chest x-ray 01/12/2021, CT chest 01/12/2021 FINDINGS: Right chest wall Port-A-Cath with tip overlying the expected region of the distal superior vena cava just proximal to the superior cavoatrial junction. The heart size and mediastinal contours unchanged. Aortic calcification. No focal consolidation. Redemonstration of coarsened with some nodular-like  interstitial markings. No pleural effusion. No pneumothorax. No acute osseous abnormality.  Old healed left rib fracture. IMPRESSION: No active cardiopulmonary disease in a patient with known stage IV lung cancer. Electronically Signed   By: Iven Finn M.D.   On: 03/03/2021 00:03   DG CHEST PORT 1 VIEW  Result Date: 03/04/2021 CLINICAL DATA:  Dyspnea, lung cancer EXAM: PORTABLE CHEST 1 VIEW COMPARISON:  03/02/2021 FINDINGS: The lungs are symmetrically well expanded and pulmonary insufflation is stable since prior examination. Superimposed diffuse interstitial pulmonary infiltrate is stable when accounting for changes in radiographic technique in keeping with mild interstitial pulmonary edema. No pneumothorax or pleural effusion. Cardiac size within normal limits. Right internal jugular chest port is unchanged with its tip within the superior vena cava. Remote left rib fracture again noted. No acute bone abnormality. IMPRESSION: Stable mild interstitial pulmonary edema. Electronically Signed   By: Fidela Salisbury MD   On: 03/04/2021 00:49    Scheduled Meds: . amiodarone  200 mg Oral Daily  . apixaban  5  mg Oral BID  . Chlorhexidine Gluconate Cloth  6 each Topical Daily  . furosemide  20 mg Intravenous Once  . [START ON 03/05/2021] isosorbide mononitrate  15 mg Oral Daily  . LORazepam  1 mg Oral QHS  . memantine  10 mg Oral q morning  . metoprolol succinate  100 mg Oral Daily  . potassium chloride SA  20 mEq Oral Daily  . sodium chloride flush  3 mL Intravenous Q12H   Continuous Infusions: . sodium chloride       LOS: 1 day    Time spent: 35 minutes   Barton Dubois, MD [facility]   To contact the attending provider between 7A-7P or the covering provider during after hours 7P-7A, please log into the web site [email or web address] and access using universal [place] password for that web site. If you do not have the password, please call the hospital operator.  03/04/2021, 5:15 PM

## 2021-03-04 NOTE — Progress Notes (Signed)
Patient taken off BiPAP. RN at bedside. RT will continue to monitor.

## 2021-03-05 LAB — RENAL FUNCTION PANEL
Albumin: 3.4 g/dL — ABNORMAL LOW (ref 3.5–5.0)
Anion gap: 6 (ref 5–15)
BUN: 6 mg/dL — ABNORMAL LOW (ref 8–23)
CO2: 23 mmol/L (ref 22–32)
Calcium: 8.5 mg/dL — ABNORMAL LOW (ref 8.9–10.3)
Chloride: 96 mmol/L — ABNORMAL LOW (ref 98–111)
Creatinine, Ser: 0.49 mg/dL (ref 0.44–1.00)
GFR, Estimated: >60 mL/min (ref >60)
Glucose, Bld: 89 mg/dL (ref 70–99)
Phosphorus: 1.8 mg/dL — ABNORMAL LOW (ref 2.5–4.6)
Potassium: 3.5 mmol/L (ref 3.5–5.1)
Sodium: 125 mmol/L — ABNORMAL LOW (ref 135–145)

## 2021-03-05 LAB — BASIC METABOLIC PANEL
Anion gap: 7 (ref 5–15)
Anion gap: <3 — ABNORMAL LOW (ref 5–15)
Anion gap: 6 (ref 5–15)
BUN: 6 mg/dL — ABNORMAL LOW (ref 8–23)
BUN: 5 mg/dL — ABNORMAL LOW (ref 8–23)
BUN: 9 mg/dL (ref 8–23)
CO2: 23 mmol/L (ref 22–32)
CO2: 14 mmol/L — ABNORMAL LOW (ref 22–32)
CO2: 22 mmol/L (ref 22–32)
Calcium: 8.4 mg/dL — ABNORMAL LOW (ref 8.9–10.3)
Calcium: 4.3 mg/dL — CL (ref 8.9–10.3)
Calcium: 9 mg/dL (ref 8.9–10.3)
Chloride: 96 mmol/L — ABNORMAL LOW (ref 98–111)
Chloride: 123 mmol/L — ABNORMAL HIGH (ref 98–111)
Chloride: 98 mmol/L (ref 98–111)
Creatinine, Ser: 0.4 mg/dL — ABNORMAL LOW (ref 0.44–1.00)
Creatinine, Ser: <0.3 mg/dL — ABNORMAL LOW (ref 0.44–1.00)
Creatinine, Ser: 0.72 mg/dL (ref 0.44–1.00)
GFR, Estimated: >60 mL/min (ref >60)
GFR, Estimated: >60 mL/min (ref >60)
Glucose, Bld: 85 mg/dL (ref 70–99)
Glucose, Bld: 63 mg/dL — ABNORMAL LOW (ref 70–99)
Glucose, Bld: 111 mg/dL — ABNORMAL HIGH (ref 70–99)
Potassium: 3.5 mmol/L (ref 3.5–5.1)
Potassium: <2 mmol/L — CL (ref 3.5–5.1)
Potassium: 3.9 mmol/L (ref 3.5–5.1)
Sodium: 126 mmol/L — ABNORMAL LOW (ref 135–145)
Sodium: 137 mmol/L (ref 135–145)
Sodium: 126 mmol/L — ABNORMAL LOW (ref 135–145)

## 2021-03-05 LAB — MAGNESIUM: Magnesium: 1.7 mg/dL (ref 1.7–2.4)

## 2021-03-05 LAB — OSMOLALITY: Osmolality: 260 mOsm/kg — ABNORMAL LOW (ref 275–295)

## 2021-03-05 LAB — PHOSPHORUS: Phosphorus: 2.1 mg/dL — ABNORMAL LOW (ref 2.5–4.6)

## 2021-03-05 MED ORDER — MELATONIN 3 MG PO TABS
3.0000 mg | ORAL_TABLET | Freq: Every day | ORAL | Status: DC
  Administered 2021-03-05: 3 mg via ORAL
  Filled 2021-03-05: qty 1

## 2021-03-05 MED ORDER — K PHOS MONO-SOD PHOS DI & MONO 155-852-130 MG PO TABS
500.0000 mg | ORAL_TABLET | Freq: Three times a day (TID) | ORAL | Status: DC
  Administered 2021-03-06 (×2): 500 mg via ORAL
  Filled 2021-03-05 (×2): qty 2

## 2021-03-05 MED ORDER — MAGNESIUM SULFATE 2 GM/50ML IV SOLN
2.0000 g | Freq: Once | INTRAVENOUS | Status: AC
  Administered 2021-03-06: 2 g via INTRAVENOUS
  Filled 2021-03-05: qty 50

## 2021-03-05 MED ORDER — POTASSIUM CHLORIDE CRYS ER 20 MEQ PO TBCR
40.0000 meq | EXTENDED_RELEASE_TABLET | ORAL | Status: AC
  Administered 2021-03-06: 40 meq via ORAL
  Filled 2021-03-05: qty 2

## 2021-03-05 MED ORDER — POTASSIUM CHLORIDE CRYS ER 20 MEQ PO TBCR
40.0000 meq | EXTENDED_RELEASE_TABLET | ORAL | Status: DC
  Administered 2021-03-05: 40 meq via ORAL
  Filled 2021-03-05: qty 2

## 2021-03-05 MED ORDER — SODIUM CHLORIDE 1 G PO TABS
1.0000 g | ORAL_TABLET | Freq: Three times a day (TID) | ORAL | Status: DC
  Administered 2021-03-05 – 2021-03-06 (×3): 1 g via ORAL
  Filled 2021-03-05 (×3): qty 1

## 2021-03-05 MED ORDER — FUROSEMIDE 10 MG/ML IJ SOLN
40.0000 mg | Freq: Once | INTRAMUSCULAR | Status: AC
  Administered 2021-03-05: 40 mg via INTRAVENOUS
  Filled 2021-03-05: qty 4

## 2021-03-05 MED ORDER — CALCIUM GLUCONATE-NACL 1-0.675 GM/50ML-% IV SOLN
1.0000 g | Freq: Once | INTRAVENOUS | Status: AC
  Administered 2021-03-05: 1000 mg via INTRAVENOUS
  Filled 2021-03-05: qty 50

## 2021-03-05 NOTE — Progress Notes (Signed)
TRH night shift.  The patient requested something to help her sleep.  Her QT was prolonged due to hypomagnesemia, but has normalized on the telemetry after magnesium supplementation.  Trazodone 50 mg p.o. x1 dose ordered.  Tennis Must, MD

## 2021-03-05 NOTE — Progress Notes (Signed)
Found patient taking her own (from home) 500mg  Tylenol. Patient stated she took 2 of them. (1000 mg). I let patient know she can not take her own medication and that I will place at the nurses station in a personal bag of hers.

## 2021-03-05 NOTE — Progress Notes (Signed)
TRH night shift.  The nursing staff reported BMP results that were not consistent with previous values.  A repeat BMP along with magnesium and phosphorus level was drawn.  See below for result of values.    Component Value Units  Basic metabolic panel [[id number]] (Abnormal)   Collected: 03/05/21 2140   Updated: 03/05/21 2239   Specimen Type: Blood    Sodium 126 Low  mmol/L   Potassium 3.9 mmol/L   Chloride 98 mmol/L   CO2 22 mmol/L   Glucose, Bld 111 High  mg/dL   BUN 9 mg/dL   Creatinine, Ser 0.72 mg/dL   Calcium 9.0 mg/dL   GFR, Estimated >60 mL/min   Anion gap 6  Magnesium [[id number]]   Collected: 03/05/21 2140   Updated: 03/05/21 2217   Specimen Type: Blood    Magnesium 1.7 mg/dL  Phosphorus [[id number]] (Abnormal)   Collected: 03/05/21 2140   Updated: 03/05/21 2217   Specimen Type: Blood    Phosphorus 2.1 Low  mg/dL  Basic metabolic panel [[id number]] (Abnormal)   Collected: 03/05/21 1800   Updated: 03/05/21 1857   Specimen Type: Blood    Sodium 137 mmol/L   Potassium <2.0 Low Panic   mmol/L   Chloride 123 High  mmol/L   CO2 14 Low  mmol/L   Glucose, Bld 63 Low  mg/dL   BUN 5 Low  mg/dL   Creatinine, Ser <0.30 Low  mg/dL   Calcium 4.3 Low Panic   mg/dL   GFR, Estimated NOT CALCULATED mL/min   Anion gap <3 Low    Magnesium and phosphorus were supplemented. Potassium supplementation was modified.  Tennis Must, MD.

## 2021-03-05 NOTE — Progress Notes (Signed)
   03/05/21 1538  Assess: MEWS Score  Temp 98.6 F (37 C)  BP (!) 94/58  Pulse Rate 99  ECG Heart Rate (!) 109  Resp 18  Level of Consciousness Alert  SpO2 98 %  O2 Device Room Air  Assess: MEWS Score  MEWS Temp 0  MEWS Systolic 1  MEWS Pulse 1  MEWS RR 0  MEWS LOC 0  MEWS Score 2  MEWS Score Color Yellow  Assess: if the MEWS score is Yellow or Red  Were vital signs taken at a resting state? Yes  Focused Assessment No change from prior assessment  Early Detection of Sepsis Score *See Row Information* Medium  MEWS guidelines implemented *See Row Information* No, vital signs rechecked  Escalate  MEWS: Escalate Yellow: discuss with charge nurse/RN and consider discussing with provider and RRT  Notify: Charge Nurse/RN  Name of Charge Nurse/RN Notified Gean Maidens , RN  Date Charge Nurse/RN Notified 03/05/21  Time Charge Nurse/RN Notified 1605  Notify: Provider  Provider Name/Title Dr. Dyann Kief  Date Provider Notified 03/05/21  Time Provider Notified 1605  Notification Type Page  Notification Reason Other (Comment)  Document  Patient Outcome Stabilized after interventions

## 2021-03-05 NOTE — Progress Notes (Signed)
PROGRESS NOTE    Cynthia Kelley  OZH:[id number] DOB: 1955-10-23 DOA: 03/02/2021 PCP: Lindell Spar, MD   Chief Complaint  Patient presents with   Shortness of Breath    Brief admission narrative:  Cynthia Kelley is a 65 y.o. female with medical history significant for metastatic squamous cell carcinoma with osseous metastasis, persistent atrial fibrillation and anxiety who presents to the emergency department due to sudden onset of shortness of breath which woke her up from sleep.  EMS was activated and on arrival of EMS team, patient was placed on albuterol and supplemental oxygen and she was taken to the ED for further evaluation and management.  She denies fever, chills, chest pain, nausea, vomiting.  She endorses a history of small cell lung cancer, though medical records from [facility] indicate that she has been following up with them due to metastatic squamous cell carcinoma.   ED Course:  In the emergency department, she was intermittently tachypneic, BP was 156/70, other vital signs were within normal range.  Work-up in the ED showed leukocytosis, macrocytic anemia, thrombocytopenia, hyponatremia, hypokalemia.  Influenza A, B, SARS coronavirus 2 was negative. Chest x-ray showed no active cardiopulmonary disease in a patient with known stage IV lung cancer Breathing treatment with DuoNeb and Ventolin were given.  Hospitalist was asked to admit patient for further evaluation and management.  Assessment & Plan: 1-hyponatremia -In the setting of prerenal azotemia most likely with recent decreased oral intake after chemotherapy treatment and continue use of diuretics. -Patient experienced vascular congestion and shortness of breath; IV fluids discontinued and patient treated with IV Lasix.  Feeling better and no requiring oxygen supplementation at this time. -Lasix IV will be repeated as per nephrology recommendations -Current sodium level 125; patient will be started on sodium tablets.    -Repeat basic metabolic panel in AM. -Appreciate assistance and recommendations by nephrology service.  Patient's hyponatremia most likely in the setting of SIADH with decompensation from dehydration.  Currently with signs of fluid overload. -Continue to follow daily weights, strict I's and O's and urine output. -Continue oxygen supplementation and wean off as tolerated.  2-Metastatic squamous cell carcinoma ([place]) -Continue outpatient follow-up with oncology service  3-acute on chronic diastolic congestive heart failure, NYHA class 2 ([place]) -In the setting of fluid resuscitation as part of treatment for hyponatremia -IV Lasix will be provided once again; resume oral diuretics on 03/06/2021. -Continue to follow daily weights and strict intake and output. -imdur has been added, continue b-blocker.  4-Acute respiratory failure with hypoxia (HCC) -In the setting of vascular congestion and heart failure exacerbation -Continue treatment as mentioned above and wean oxygen supplementation as tolerated -Patient continues to smoke and has an underlying history of stage IV lung cancer most likely also contributing.  5-Hypokalemia -In the setting of diuresis and decreased oral intake -Follow electrolytes and further replete as needed.  6-paroxysmal atrial fibrillation -Continue amiodarone and metoprolol -Continue Eliquis for secondary prevention. -Patient denies palpitation.  7-essential hypertension -Stable overall -Continue current antihypertensive regimen.   DVT prophylaxis: Chronically on Eliquis. Code Status: DNR Family Communication: Husband at bedside Disposition:   Status is: Inpatient  Dispo: The patient is from: Home              Anticipated d/c is to: Home              Patient currently currently no medically stable for discharge; experiencing vascular congestion and is still ongoing hyponatremia process.   Difficult to place patient no  Consultants:  Palliative  care Nephrology service  Procedures:  See below for x-ray reports  Antimicrobials:  None   Subjective: Breathing easier and not requiring oxygen supplementation.  No chest pain, no nausea, no vomiting.  Objective: Vitals:   03/05/21 0725 03/05/21 0800 03/05/21 1000 03/05/21 1100  BP:  (!) 169/67  (!) 100/55  Pulse: 84 81 78 (!) 105  Resp:      Temp: 98.5 F (36.9 C)     TempSrc: Oral     SpO2: 94% 93% 98% 96%  Weight:      Height:       No intake or output data in the 24 hours ending 03/05/21 1109  Filed Weights   03/02/21 2325 03/03/21 1231 03/04/21 0412  Weight: 60.8 kg 60.6 kg 69.5 kg    Examination: General exam: Alert, awake, no acute distress and cooperative with examination; no palpitations, no nausea, no vomiting, no chest pain. Respiratory system: Improved air movement bilaterally; positive fine crackles appreciated at the bases.  No using accessory muscle.  Good saturation on room air. Cardiovascular system: Rate controlled currently; no rubs or gallops appreciated on exam. Gastrointestinal system: Abdomen is nondistended, soft and nontender. No organomegaly or masses felt. Normal bowel sounds heard. Central nervous system: Alert and oriented. No focal neurological deficits. Extremities: No cyanosis or clubbing. Skin: No petechiae Psychiatry: Judgement and insight appear normal. Mood & affect appropriate.   Data Reviewed: I have personally reviewed following labs and imaging studies  CBC: Recent Labs  Lab 03/03/21 0002 03/04/21 0419  WBC 28.7* 31.2*  NEUTROABS 27.4*  --   HGB 10.2* 8.3*  HCT 28.4* 23.4*  MCV 108.8* 112.0*  PLT 117* 75*    Basic Metabolic Panel: Recent Labs  Lab 03/03/21 0002 03/03/21 1201 03/04/21 0250 03/05/21 0916  NA 118* 122* 122* 126*  125*  K 3.3* 3.9 3.8 3.5  3.5  CL 88* 93* 96* 96*  96*  CO2 22 20* 21* 23  23  GLUCOSE 115* 99 131* 85  89  BUN 10 7* 7* 6*  6*  CREATININE 0.54 0.42* 0.39* 0.40*  0.49   CALCIUM 8.1* 8.4* 7.7* 8.4*  8.5*  MG 1.5*  --   --   --   PHOS 2.5  --   --  1.8*   GFR: Estimated Creatinine Clearance: 64.8 mL/min (by C-G formula based on SCr of 0.49 mg/dL).  Liver Function Tests: Recent Labs  Lab 03/04/21 0250 03/05/21 0916  AST 32  --   ALT 24  --   ALKPHOS 96  --   BILITOT 0.9  --   PROT 5.3*  --   ALBUMIN 3.1* 3.4*    CBG: Recent Labs  Lab 03/03/21 2302  GLUCAP 106*    Recent Results (from the past 240 hour(s))  Resp Panel by RT-PCR (Flu A&B, Covid) Nasopharyngeal Swab     Status: None   Collection Time: 03/03/21  3:18 AM   Specimen: Nasopharyngeal Swab; Nasopharyngeal(NP) swabs in vial transport medium  Result Value Ref Range Status   SARS Coronavirus 2 by RT PCR NEGATIVE NEGATIVE Final    Comment: (NOTE) SARS-CoV-2 target nucleic acids are NOT DETECTED.  The SARS-CoV-2 RNA is generally detectable in upper respiratory specimens during the acute phase of infection. The lowest concentration of SARS-CoV-2 viral copies this assay can detect is 138 copies/mL. A negative result does not preclude SARS-Cov-2 infection and should not be used as the sole basis for treatment or other patient management  decisions. A negative result may occur with  improper specimen collection/handling, submission of specimen other than nasopharyngeal swab, presence of viral mutation(s) within the areas targeted by this assay, and inadequate number of viral copies(<138 copies/mL). A negative result must be combined with clinical observations, patient history, and epidemiological information. The expected result is Negative.  Fact Sheet for Patients:  EntrepreneurPulse.com.au  Fact Sheet for Healthcare Providers:  IncredibleEmployment.be  This test is no t yet approved or cleared by the [place] FDA and  has been authorized for detection and/or diagnosis of SARS-CoV-2 by FDA under an Emergency Use Authorization (EUA).  This EUA will remain  in effect (meaning this test can be used) for the duration of the COVID-19 declaration under Section 564(b)(1) of the Act, 21 U.S.C.section 360bbb-3(b)(1), unless the authorization is terminated  or revoked sooner.       Influenza A by PCR NEGATIVE NEGATIVE Final   Influenza B by PCR NEGATIVE NEGATIVE Final    Comment: (NOTE) The Xpert Xpress SARS-CoV-2/FLU/RSV plus assay is intended as an aid in the diagnosis of influenza from Nasopharyngeal swab specimens and should not be used as a sole basis for treatment. Nasal washings and aspirates are unacceptable for Xpert Xpress SARS-CoV-2/FLU/RSV testing.  Fact Sheet for Patients: EntrepreneurPulse.com.au  Fact Sheet for Healthcare Providers: IncredibleEmployment.be  This test is not yet approved or cleared by the [place] FDA and has been authorized for detection and/or diagnosis of SARS-CoV-2 by FDA under an Emergency Use Authorization (EUA). This EUA will remain in effect (meaning this test can be used) for the duration of the COVID-19 declaration under Section 564(b)(1) of the Act, 21 U.S.C. section 360bbb-3(b)(1), unless the authorization is terminated or revoked.  Performed at [facility], [address], [place], [place] [address]   MRSA PCR Screening     Status: None   Collection Time: 03/04/21 12:30 AM   Specimen: Nasopharyngeal  Result Value Ref Range Status   MRSA by PCR NEGATIVE NEGATIVE Final    Comment:        The GeneXpert MRSA Assay (FDA approved for NASAL specimens only), is one component of a comprehensive MRSA colonization surveillance program. It is not intended to diagnose MRSA infection nor to guide or monitor treatment for MRSA infections. Performed at [facility], [address], [place], [place] [address]      Radiology Studies: DG CHEST PORT 1 VIEW  Result Date: 03/04/2021 CLINICAL DATA:  Dyspnea, lung cancer EXAM: PORTABLE CHEST 1  VIEW COMPARISON:  03/02/2021 FINDINGS: The lungs are symmetrically well expanded and pulmonary insufflation is stable since prior examination. Superimposed diffuse interstitial pulmonary infiltrate is stable when accounting for changes in radiographic technique in keeping with mild interstitial pulmonary edema. No pneumothorax or pleural effusion. Cardiac size within normal limits. Right internal jugular chest port is unchanged with its tip within the superior vena cava. Remote left rib fracture again noted. No acute bone abnormality. IMPRESSION: Stable mild interstitial pulmonary edema. Electronically Signed   By: Fidela Salisbury MD   On: 03/04/2021 00:49     Scheduled Meds:  amiodarone  200 mg Oral Daily   apixaban  5 mg Oral BID   Chlorhexidine Gluconate Cloth  6 each Topical Daily   furosemide  40 mg Intravenous Once   isosorbide mononitrate  15 mg Oral Daily   LORazepam  1 mg Oral QHS   melatonin  3 mg Oral QHS   memantine  10 mg Oral q morning   metoprolol succinate  100 mg Oral Daily   potassium chloride SA  20 mEq Oral Daily   sodium chloride flush  3 mL Intravenous Q12H   sodium chloride  1 g Oral TID WC   Continuous Infusions:  sodium chloride       LOS: 2 days    Time spent: 35 minutes   Barton Dubois, MD [facility]   To contact the attending provider between 7A-7P or the covering provider during after hours 7P-7A, please log into the web site [email or web address] and access using universal [place] password for that web site. If you do not have the password, please call the hospital operator.  03/05/2021, 11:09 AM

## 2021-03-05 NOTE — Consult Note (Signed)
Nephrology Consult   Requesting provider: Barton Dubois Service requesting consult: Hospitalist Reason for consult: Hyponatremia   Assessment/Recommendations: Cynthia Kelley is a/an 65 y.o. female with a past medical history etastatic squamous cell carcinoma, atrial fibrillation, anxiety, chronic hyponatremia, diastolic heart failure who present w/ hyponatremia  Asymptomatic severe hyponatremia: Possibly symptoms on admission but at this time she is asymptomatic.  Sodium is slightly improved with hydration.  Largest component is likely SIADH which appears to be chronic. CHF may be contributing. Hard to say why it acutely worsened -Stop fluids -IV lasix 40mg  today -Start Na tabs 1g TID and titrate PRN -Consider more official fluid restriction -BMP BID -Chronic hyponatremia based on outpatient records.  Goal is 125-130 prior to DC.  Hypertension: Continue medications as currently ordered  Hypokalemia: Continue replacing as needed  Hematuria/proteinuria: New on urinalysis.  Differential would be broad.  Renal function is normal.  This should be repeated and worked up further outpatient.  Can be worked up inpatient if creatinine rises.  Metastatic squamous cell carcinoma: Follows with [facility] oncology.  Receiving Gemcitabine  Hypoxia/CHF exacerbation: elevated BNP, hyponatremia, crackles on exam.  Diuretics as above.  Redose as needed  Atrial fibrillation: Continue Eliquis, amiodarone, metoprolol   Recommendations conveyed to primary service.    [facility] 03/05/2021 10:00 AM   _____________________________________________________________________________________ CC: Hyponatremia  History of Present Illness: Cynthia Kelley is a/an 65 y.o. female with a past medical history of metastatic squamous cell carcinoma, atrial fibrillation, anxiety, chronic hyponatremia, diastolic heart failure who presents with worsening anxiety symptoms and found to have  hyponatremia.  The patient states that she woke up from sleep and had shortness of breath.  She also states that around this time she has been having dissociative symptoms with symptoms of panic and shortness of breath that has been worsening.  She just did not feel like herself.  She called EMS because of the shortness of breath that woke her up.  She was given albuterol and supplemental oxygen and brought to the emergency department.  Overall her symptoms were otherwise benign denying fevers, chills, chest pain, nausea, vomiting, diarrhea.  In the emergency department her vital signs were overall reassuring.  She did have leukocytosis, anemia, thrombocytopenia, hypokalemia, hyponatremia with a sodium of 118.  Notably BNP was 1352 the patient was admitted and started on normal saline and sodium improved to 122.  However, yesterday the patient had an acute episode of shortness of breath associated with hypertension that improved with lowering of blood pressure acutely and providing Lasix 20 mg IV.  Patient states that her shortness of breath is improved today.  She does not remember being told that her sodium was very low but based on her outpatient oncology notes she has had chronic hyponatremia for years presumed to be secondary to SIADH treated with sodium tablets.  She has been on sodium tablets for least 3 years she thinks.  Urine sodium was 25 with a urine osmolality of 338.  Urinalysis from 03/03/2021 demonstrated extensive proteinuria and hematuria.  Serum creatinine is normal.   Medications:  Current Facility-Administered Medications  Medication Dose Route Frequency Provider Last Rate Last Admin   0.9 %  sodium chloride infusion  250 mL Intravenous PRN Barton Dubois, MD       acetaminophen (TYLENOL) tablet 650 mg  650 mg Oral Q6H PRN Zierle-Ghosh, Asia B, DO   650 mg at 03/05/21 0902   albuterol (VENTOLIN HFA) 108 (90 Base) MCG/ACT inhaler 2 puff  2 puff Inhalation Q2H PRN Bernadette Hoit, DO        amiodarone (PACERONE) tablet 200 mg  200 mg Oral Daily Barton Dubois, MD   200 mg at 03/05/21 0902   apixaban (ELIQUIS) tablet 5 mg  5 mg Oral BID Adefeso, Oladapo, DO   5 mg at 03/05/21 6599   Chlorhexidine Gluconate Cloth 2 % PADS 6 each  6 each Topical Daily Adefeso, Oladapo, DO   6 each at 03/05/21 0904   furosemide (LASIX) injection 40 mg  40 mg Intravenous Once Reesa Chew, MD       guaiFENesin [facility]) 12 hr tablet 1,200 mg  1,200 mg Oral BID PRN Pershing Proud, NP   3,570 mg at 03/04/21 1834   hydrALAZINE (APRESOLINE) injection 10 mg  10 mg Intravenous Q8H PRN Barton Dubois, MD       isosorbide mononitrate (IMDUR) 24 hr tablet 15 mg  15 mg Oral Daily Barton Dubois, MD   15 mg at 03/05/21 0902   LORazepam (ATIVAN) tablet 1 mg  1 mg Oral QHS Adefeso, Oladapo, DO   1 mg at 03/04/21 2139   melatonin tablet 3 mg  3 mg Oral QHS Pershing Proud, NP       memantine [facility]) tablet 10 mg  10 mg Oral q morning Barton Dubois, MD   10 mg at 03/05/21 0902   metoprolol succinate (TOPROL-XL) 24 hr tablet 100 mg  100 mg Oral Daily Adefeso, Oladapo, DO   100 mg at 03/05/21 0903   potassium chloride SA (KLOR-CON) CR tablet 20 mEq  20 mEq Oral Daily Barton Dubois, MD   20 mEq at 03/05/21 0903   sodium chloride flush (NS) 0.9 % injection 3 mL  3 mL Intravenous Q12H Barton Dubois, MD   3 mL at 03/05/21 0904   sodium chloride flush (NS) 0.9 % injection 3 mL  3 mL Intravenous PRN Barton Dubois, MD       sodium chloride tablet 1 g  1 g Oral TID WC Reesa Chew, MD         ALLERGIES Patient has no known allergies.  MEDICAL HISTORY Past Medical History:  Diagnosis Date   Acute diastolic congestive heart failure, NYHA class 2 ([place]) 11/11/2020   Anemia 11/22/2017   Anxiety 11/19/2020   Arthritis    Asymptomatic PVD (peripheral vascular disease) ([place]) 12/14/2016   Suspected.  Hands hyperemic with decreased cap refill. ? History vasospasm   COPD with acute exacerbation ([place]) 01/14/2021    FRACTURE, TIBIAL PLATEAU 05/11/2010   Qualifier: Diagnosis of  By: Aline Brochure MD, [place]     Lung cancer, primary, with metastasis from lung to other site [facility]) 01/16/2018   Nuclear sclerotic cataract of both eyes 06/08/2018   Osteopenia 05/15/2013   Osteoporosis    osteopenia   Tobacco abuse 12/14/2016     SOCIAL HISTORY Social History   Socioeconomic History   Marital status: Married    Spouse name: [name]   Number of children: 1   Years of education: 15   Highest education level: Not on file  Occupational History   Occupation: Air traffic controller    Comment: retired  Tobacco Use   Smoking status: Every Day    Packs/day: 0.50    Years: 20.00    Pack years: 10.00    Types: Cigarettes    Start date: 09/28/1963   Smokeless tobacco: Never  Vaping Use   Vaping Use: Never used  Substance and Sexual Activity  Alcohol use: Yes    Alcohol/week: 20.0 standard drinks    Types: 20 Cans of beer per week    Comment: 3-4 beers a day   Drug use: No   Sexual activity: Yes    Birth control/protection: Post-menopausal  Other Topics Concern   Not on file  Social History Narrative   Retired Air traffic controller   Lives with husband [name]   Daughter Marcelino Scot is in [place] to walk   Social Determinants of Radio broadcast assistant Strain: Not on file  Food Insecurity: Not on file  Transportation Needs: Not on file  Physical Activity: Not on file  Stress: Not on file  Social Connections: Not on file  Intimate Partner Violence: Not on file     FAMILY HISTORY Family History  Problem Relation Age of Onset   Cancer Father        died in 34's everywhere   Heart disease Mother    Cancer Mother        lung   Stroke Mother    Asthma Daughter       Review of Systems: 12 systems reviewed Otherwise as per HPI, all other systems reviewed and negative  Physical Exam: Vitals:   03/05/21 0725 03/05/21 0800  BP:  (!) 169/67  Pulse: 84 81  Resp:    Temp: 98.5 F (36.9 C)   SpO2: 94% 93%    No intake/output data recorded.  Intake/Output Summary (Last 24 hours) at 03/05/2021 1000 Last data filed at 03/04/2021 1006 Gross per 24 hour  Intake 5.63 ml  Output --  Net 5.63 ml   General: well-appearing, no acute distress HEENT: anicteric sclera, oropharynx clear without lesions CV: regular rate, normal rhythm, no murmurs, no gallops, no rubs, no edema Lungs: Bibasilar crackles normal work of breathing Abd: soft, non-tender, non-distended Skin: no visible lesions or rashes Psych: alert, engaged, appropriate mood and affect Musculoskeletal: no obvious deformities Neuro: normal speech, no gross focal deficits   Test Results Reviewed Lab Results  Component Value Date   NA 122 (L) 03/04/2021   K 3.8 03/04/2021   CL 96 (L) 03/04/2021   CO2 21 (L) 03/04/2021   BUN 7 (L) 03/04/2021   CREATININE 0.39 (L) 03/04/2021   CALCIUM 7.7 (L) 03/04/2021   ALBUMIN 3.1 (L) 03/04/2021   PHOS 2.5 03/03/2021     I have reviewed all relevant outside healthcare records related to the patient's current hospitalization

## 2021-03-05 NOTE — Progress Notes (Addendum)
Date and time results received: 03/05/21 NOW  (use smartphrase ".now" to insert current time)  Test: BMP Critical Value: POTASSIUM LESS THAN 2.0 CALCIUM 4.3  Name of Provider Notified: PAGED DR. CARLOS MADERA  Orders Received? Or Actions Taken?: AWAITING NEW ORDERS

## 2021-03-06 LAB — BASIC METABOLIC PANEL
Anion gap: 6 (ref 5–15)
BUN: 8 mg/dL (ref 8–23)
CO2: 24 mmol/L (ref 22–32)
Calcium: 9.5 mg/dL (ref 8.9–10.3)
Chloride: 101 mmol/L (ref 98–111)
Creatinine, Ser: 0.55 mg/dL (ref 0.44–1.00)
GFR, Estimated: >60 mL/min (ref >60)
Glucose, Bld: 103 mg/dL — ABNORMAL HIGH (ref 70–99)
Potassium: 4.6 mmol/L (ref 3.5–5.1)
Sodium: 131 mmol/L — ABNORMAL LOW (ref 135–145)

## 2021-03-06 MED ORDER — SODIUM CHLORIDE 1 G PO TABS: 1.0000 g | ORAL_TABLET | Freq: Two times a day (BID) | ORAL | Status: DC

## 2021-03-06 MED ORDER — HEPARIN SOD (PORK) LOCK FLUSH 100 UNIT/ML IV SOLN
500.0000 [IU] | Freq: Once | INTRAVENOUS | Status: AC
  Administered 2021-03-06: 500 [IU] via INTRAVENOUS
  Filled 2021-03-06: qty 5

## 2021-03-06 MED ORDER — FUROSEMIDE 20 MG PO TABS
20.0000 mg | ORAL_TABLET | Freq: Every day | ORAL | Status: DC
  Administered 2021-03-06: 20 mg via ORAL
  Filled 2021-03-06: qty 1

## 2021-03-06 MED ORDER — SODIUM CHLORIDE 1 G PO TABS: 1.0000 g | ORAL_TABLET | Freq: Two times a day (BID) | ORAL | 2 refills | Status: DC

## 2021-03-06 NOTE — Discharge Summary (Signed)
Physician Discharge Summary  Cynthia Kelley JOI:[id number] DOB: Feb 13, 1956 DOA: 03/02/2021  PCP: Lindell Spar, MD  Admit date: 03/02/2021 Discharge date: 03/06/2021  Time spent: 35 minutes  Recommendations for Outpatient Follow-up:  Repeat basic metabolic panel to follow lites and renal function Assist patient With smoking cessation process Reassess blood pressure and adjust antihypertensive regimen as required. Continue assisting patient with tobacco cessation.  Discharge Diagnoses:  Principal Problem:   Hyponatremia Active Problems:   Metastatic squamous cell carcinoma (HCC)   Diastolic congestive heart failure, NYHA class 2 (HCC)   Acute respiratory failure with hypoxia (HCC)   Hypokalemia   Leukocytosis   Macrocytic anemia   Thrombocytopenia (HCC)   Prolonged QT interval   Dehydration   Discharge Condition: Stable and improved.  Discharged home with instruction to follow-up with PCP and oncology service as an outpatient.  CODE STATUS: DNR  Diet recommendation: Heart healthy diet  Filed Weights   03/03/21 1231 03/04/21 0412 03/06/21 0500  Weight: 60.6 kg 69.5 kg 58.9 kg    History of present illness:  Cynthia Kelley is a 65 y.o. female with medical history significant for metastatic squamous cell carcinoma with osseous metastasis, persistent atrial fibrillation and anxiety who presents to the emergency department due to sudden onset of shortness of breath which woke her up from sleep.  EMS was activated and on arrival of EMS team, patient was placed on albuterol and supplemental oxygen and she was taken to the ED for further evaluation and management.  She denies fever, chills, chest pain, nausea, vomiting.  She endorses a history of small cell lung cancer, though medical records from [facility] indicate that she has been following up with them due to metastatic squamous cell carcinoma.   ED Course:  In the emergency department, she was intermittently tachypneic, BP was 156/70,  other vital signs were within normal range.  Work-up in the ED showed leukocytosis, macrocytic anemia, thrombocytopenia, hyponatremia, hypokalemia.  Influenza A, B, SARS coronavirus 2 was negative. Chest x-ray showed no active cardiopulmonary disease in a patient with known stage IV lung cancer Breathing treatment with DuoNeb and Ventolin were given.  Hospitalist was asked to admit patient for further evaluation and management.  Hospital Course:  1-hyponatremia -In the setting of prerenal azotemia most likely with recent decreased oral intake after chemotherapy treatment and continue use of diuretics. -Patient experienced vascular congestion and shortness of breath; IV fluids discontinued and patient treated with IV Lasix.  -Current sodium level 131; patient has been started on sodium tablets BID as recommended by nephrology service. Continue daily Lasix as previously prescribed. -Repeat basic metabolic panel at follow-up visit to assess electrolyte stability. -Appreciate assistance and recommendations by nephrology service.  Patient's hyponatremia most likely in the setting of SIADH with decompensation from dehydration. -Continue to follow daily weights and maintain adequate hydration.   2-Metastatic squamous cell carcinoma ([place]) -Continue outpatient follow-up with oncology service   3-acute on chronic diastolic congestive heart failure, NYHA class 2 ([place]) -In the setting of vascular congestion from fluid resuscitation as part of treatment for hyponatremia. -I stabilized after IV diuretics provided; instructed to continue watching daily weights, to maintain adequate hydration and to resume home diuretics regimen. -Continue b-blocker dose as previously prescribed.   4-Acute respiratory failure with hypoxia (HCC) -In the setting of vascular congestion and heart failure exacerbation -Continue treatment as mentioned above and wean oxygen supplementation as tolerated -Patient continues to smoke  and has an underlying history of stage IV lung cancer most  likely also contributing.   5-Hypokalemia -In the setting of diuresis and decreased oral intake -Repleted and within normal limits at discharge; will continue daily maintenance supplementation. -Repeat basic metabolic panel at follow-up visit to reassess electrolytes stability   6-paroxysmal atrial fibrillation -Continue amiodarone and metoprolol -Continue Eliquis for secondary prevention. -Patient denies palpitation.   7-essential hypertension -Stable overall -Continue current antihypertensive regimen.  8-anxiety/dementia-continue as needed benzodiazepines and continue the use of Namenda    Procedures: See below for x-ray reports reviewed  Consultations: Nephrology service Palliative care service  Discharge Exam: Vitals:   03/06/21 0929 03/06/21 1405  BP: 130/67 (!) 144/60  Pulse: 81 78  Resp:    Temp:  98.4 F (36.9 C)  SpO2: 96% 95%    General: Alert, awake, no chest pain, no rash, no vomiting.  Reports significant improvement in her symptoms and breathing is no requiring oxygen supplementation. Cardiovascular: Rate controlled, no rubs, no gallops, no JVD on exam. Respiratory: No wheezing or crackles on exam; no using accessory muscle.  Positive scattered rhonchi appreciated. Abdomen: Soft, nontender, nondistended, positive bowel sounds Extremities: No cyanosis or clubbing.  Discharge Instructions   Discharge Instructions     (HEART FAILURE PATIENTS) Call MD:  Anytime you have any of the following symptoms: 1) 3 pound weight gain in 24 hours or 5 pounds in 1 week 2) shortness of breath, with or without a dry hacking cough 3) swelling in the hands, feet or stomach 4) if you have to sleep on extra pillows at night in order to breathe.   Complete by: As directed    Diet - low sodium heart healthy   Complete by: As directed    Discharge instructions   Complete by: As directed    Take medications as  prescribed Follow-up with PCP in 10 days Continue patient follow-up with oncology service Maintain adequate hydration and nutrition.      Allergies as of 03/06/2021   No Known Allergies      Medication List     TAKE these medications    amiodarone 200 MG tablet Commonly known as: PACERONE Take 200 mg by mouth daily.   apixaban 5 MG Tabs tablet Commonly known as: ELIQUIS Take 5 mg by mouth 2 (two) times daily.   furosemide 20 MG tablet Commonly known as: LASIX Take 1 tablet (20 mg total) by mouth daily.   LORazepam 1 MG tablet Commonly known as: ATIVAN Take 1 mg by mouth at bedtime.   memantine 10 MG tablet Commonly known as: NAMENDA Take 10 mg by mouth every morning.   metoprolol 200 MG 24 hr tablet Commonly known as: TOPROL-XL Take 200 mg by mouth daily.   multivitamin with minerals tablet Take 1 tablet by mouth daily.   potassium chloride SA 20 MEQ tablet Commonly known as: KLOR-CON Take 20 mEq by mouth daily.   sodium chloride 1 g tablet Take 1 tablet (1 g total) by mouth 2 (two) times daily with a meal.       No Known Allergies  Follow-up Information     Lindell Spar, MD. Schedule an appointment as soon as possible for a visit in 10 day(s).   Specialty: Internal Medicine Contact information: [address] [place] [address] [phone]                 The results of significant diagnostics from this hospitalization (including imaging, microbiology, ancillary and laboratory) are listed below for reference.    Significant Diagnostic Studies:  DG Chest 2 View  Result Date: 03/03/2021 CLINICAL DATA:  sob that started tonight. Pt states she received chemo x last Thursday and hasn't felt well since. Hx of stage 4 lung cancer EXAM: CHEST - 2 VIEW COMPARISON:  Chest x-ray 01/12/2021, CT chest 01/12/2021 FINDINGS: Right chest wall Port-A-Cath with tip overlying the expected region of the distal superior vena cava just proximal to the  superior cavoatrial junction. The heart size and mediastinal contours unchanged. Aortic calcification. No focal consolidation. Redemonstration of coarsened with some nodular-like interstitial markings. No pleural effusion. No pneumothorax. No acute osseous abnormality.  Old healed left rib fracture. IMPRESSION: No active cardiopulmonary disease in a patient with known stage IV lung cancer. Electronically Signed   By: Iven Finn M.D.   On: 03/03/2021 00:03   DG CHEST PORT 1 VIEW  Result Date: 03/04/2021 CLINICAL DATA:  Dyspnea, lung cancer EXAM: PORTABLE CHEST 1 VIEW COMPARISON:  03/02/2021 FINDINGS: The lungs are symmetrically well expanded and pulmonary insufflation is stable since prior examination. Superimposed diffuse interstitial pulmonary infiltrate is stable when accounting for changes in radiographic technique in keeping with mild interstitial pulmonary edema. No pneumothorax or pleural effusion. Cardiac size within normal limits. Right internal jugular chest port is unchanged with its tip within the superior vena cava. Remote left rib fracture again noted. No acute bone abnormality. IMPRESSION: Stable mild interstitial pulmonary edema. Electronically Signed   By: Fidela Salisbury MD   On: 03/04/2021 00:49    Microbiology: Recent Results (from the past 240 hour(s))  Resp Panel by RT-PCR (Flu A&B, Covid) Nasopharyngeal Swab     Status: None   Collection Time: 03/03/21  3:18 AM   Specimen: Nasopharyngeal Swab; Nasopharyngeal(NP) swabs in vial transport medium  Result Value Ref Range Status   SARS Coronavirus 2 by RT PCR NEGATIVE NEGATIVE Final    Comment: (NOTE) SARS-CoV-2 target nucleic acids are NOT DETECTED.  The SARS-CoV-2 RNA is generally detectable in upper respiratory specimens during the acute phase of infection. The lowest concentration of SARS-CoV-2 viral copies this assay can detect is 138 copies/mL. A negative result does not preclude SARS-Cov-2 infection and should not be  used as the sole basis for treatment or other patient management decisions. A negative result may occur with  improper specimen collection/handling, submission of specimen other than nasopharyngeal swab, presence of viral mutation(s) within the areas targeted by this assay, and inadequate number of viral copies(<138 copies/mL). A negative result must be combined with clinical observations, patient history, and epidemiological information. The expected result is Negative.  Fact Sheet for Patients:  EntrepreneurPulse.com.au  Fact Sheet for Healthcare Providers:  IncredibleEmployment.be  This test is no t yet approved or cleared by the [place] FDA and  has been authorized for detection and/or diagnosis of SARS-CoV-2 by FDA under an Emergency Use Authorization (EUA). This EUA will remain  in effect (meaning this test can be used) for the duration of the COVID-19 declaration under Section 564(b)(1) of the Act, 21 U.S.C.section 360bbb-3(b)(1), unless the authorization is terminated  or revoked sooner.       Influenza A by PCR NEGATIVE NEGATIVE Final   Influenza B by PCR NEGATIVE NEGATIVE Final    Comment: (NOTE) The Xpert Xpress SARS-CoV-2/FLU/RSV plus assay is intended as an aid in the diagnosis of influenza from Nasopharyngeal swab specimens and should not be used as a sole basis for treatment. Nasal washings and aspirates are unacceptable for Xpert Xpress SARS-CoV-2/FLU/RSV testing.  Fact Sheet for Patients: EntrepreneurPulse.com.au  Fact  Sheet for Healthcare Providers: IncredibleEmployment.be  This test is not yet approved or cleared by the [place] and has been authorized for detection and/or diagnosis of SARS-CoV-2 by FDA under an Emergency Use Authorization (EUA). This EUA will remain in effect (meaning this test can be used) for the duration of the COVID-19 declaration under Section  564(b)(1) of the Act, 21 U.S.C. section 360bbb-3(b)(1), unless the authorization is terminated or revoked.  Performed at [facility], [address], [place], [place] [address]   MRSA PCR Screening     Status: None   Collection Time: 03/04/21 12:30 AM   Specimen: Nasopharyngeal  Result Value Ref Range Status   MRSA by PCR NEGATIVE NEGATIVE Final    Comment:        The GeneXpert MRSA Assay (FDA approved for NASAL specimens only), is one component of a comprehensive MRSA colonization surveillance program. It is not intended to diagnose MRSA infection nor to guide or monitor treatment for MRSA infections. Performed at [facility], [address], [place], [place] [address]      Labs: Basic Metabolic Panel: Recent Labs  Lab 03/03/21 0002 03/03/21 1201 03/04/21 0250 03/05/21 0916 03/05/21 1800 03/05/21 2140 03/06/21 0517  NA 118*   < > 122* 126*  125* 137 126* 131*  K 3.3*   < > 3.8 3.5  3.5 <2.0* 3.9 4.6  CL 88*   < > 96* 96*  96* 123* 98 101  CO2 22   < > 21* 23  23 14* 22 24  GLUCOSE 115*   < > 131* 85  89 63* 111* 103*  BUN 10   < > 7* 6*  6* 5* 9 8  CREATININE 0.54   < > 0.39* 0.40*  0.49 <0.30* 0.72 0.55  CALCIUM 8.1*   < > 7.7* 8.4*  8.5* 4.3* 9.0 9.5  MG 1.5*  --   --   --   --  1.7  --   PHOS 2.5  --   --  1.8*  --  2.1*  --    < > = values in this interval not displayed.   Liver Function Tests: Recent Labs  Lab 03/04/21 0250 03/05/21 0916  AST 32  --   ALT 24  --   ALKPHOS 96  --   BILITOT 0.9  --   PROT 5.3*  --   ALBUMIN 3.1* 3.4*   CBC: Recent Labs  Lab 03/03/21 0002 03/04/21 0419  WBC 28.7* 31.2*  NEUTROABS 27.4*  --   HGB 10.2* 8.3*  HCT 28.4* 23.4*  MCV 108.8* 112.0*  PLT 117* 75*    BNP (last 3 results) Recent Labs    01/12/21 0145 03/03/21 0002  BNP 2,115.0* 1,352.0*    CBG: Recent Labs  Lab 03/03/21 2302  GLUCAP 106*    Signed:  Barton Dubois MD.  [facility] 03/06/2021, 2:47 PM

## 2021-03-06 NOTE — Care Management Important Message (Signed)
Important Message  Patient Details  Name: Cynthia Kelley MRN: [id number] Date of Birth: Feb 15, 1956   Medicare Important Message Given:  Yes     Tommy Medal 03/06/2021, 9:23 AM

## 2021-03-06 NOTE — Progress Notes (Signed)
Nephrology Follow-Up Consult note   Assessment/Recommendations: Cynthia Kelley is a/an 65 y.o. female with a past medical history significant for metastatic squamous cell carcinoma, atrial fibrillation, anxiety, chronic hyponatremia, diastolic heart failure who present w/ hyponatremia, admitted for hyponatremia.     Asymptomatic severe hyponatremia secondary to SIADH: Sodium as low as 118 on admission.  Chronic hyponatremia outpatient secondary to SIADH from malignancy.  Sodium greatly improved at this time with Lasix and salt tablets -Reduce salt tabs to 1 g twice daily which is outpatient regimen -Can adjust salt tab dose as needed to achieve sodium outpatient goal of 130-135 -Resume home Lasix 20 mg daily -Avoid excessive oral hydration -The patient sodium is at goal and is safe to discharge from nephrology standpoint.  Would arrange for BMP on Monday if she does discharge   Hypertension: blood pressure higher overnight.  Restart home Lasix and can restart home blood pressure medicines as needed   Hematuria/proteinuria: New on urinalysis.  Differential would be broad.  Renal function is normal.  This should be repeated and worked up further outpatient.  Can be worked up inpatient if creatinine rises.   Metastatic squamous cell carcinoma: Follows with [facility] oncology.  Receiving Gemcitabine   Hypoxia/CHF exacerbation: elevated BNP, hyponatremia, crackles on exam.  IV Lasix yesterday.  Resume home oral diuretics   Atrial fibrillation: Continue Eliquis, amiodarone, metoprolol  Given the patient's improvement we will sign off at this time.  Please do not hesitate to call us if further issues arise.   Recommendations conveyed to primary service.    [facility] 03/06/2021 9:11 AM  ___________________________________________________________  CC: Hyponatremia  Interval History/Subjective: Patient states she feels well today with few complaints.  No  nausea or confusion.  Trouble sleeping last night but that was related to the bed.  No significant shortness of breath.   Medications:  Current Facility-Administered Medications  Medication Dose Route Frequency Provider Last Rate Last Admin   0.9 %  sodium chloride infusion  250 mL Intravenous PRN Barton Dubois, MD       acetaminophen (TYLENOL) tablet 650 mg  650 mg Oral Q6H PRN Barton Dubois, MD   650 mg at 03/05/21 0902   albuterol (VENTOLIN HFA) 108 (90 Base) MCG/ACT inhaler 2 puff  2 puff Inhalation Q2H PRN Barton Dubois, MD       amiodarone (PACERONE) tablet 200 mg  200 mg Oral Daily Barton Dubois, MD   200 mg at 03/05/21 0902   apixaban (ELIQUIS) tablet 5 mg  5 mg Oral BID Barton Dubois, MD   5 mg at 03/05/21 2144   Chlorhexidine Gluconate Cloth 2 % PADS 6 each  6 each Topical Daily Barton Dubois, MD   6 each at 03/05/21 5643   furosemide (LASIX) tablet 20 mg  20 mg Oral Daily Reesa Chew, MD       guaiFENesin (MUCINEX) 12 hr tablet 1,200 mg  1,200 mg Oral BID PRN Barton Dubois, MD   1,200 mg at 03/04/21 1834   hydrALAZINE (APRESOLINE) injection 10 mg  10 mg Intravenous Q8H PRN Barton Dubois, MD   10 mg at 03/06/21 0406   LORazepam (ATIVAN) tablet 1 mg  1 mg Oral QHS Barton Dubois, MD   1 mg at 03/05/21 2144   melatonin tablet 3 mg  3 mg Oral QHS Barton Dubois, MD   3 mg at 03/05/21 2144   memantine (NAMENDA) tablet 10 mg  10 mg Oral q morning Barton Dubois, MD  10 mg at 03/05/21 0902   metoprolol succinate (TOPROL-XL) 24 hr tablet 100 mg  100 mg Oral Daily Barton Dubois, MD   100 mg at 03/05/21 2778   phosphorus (K PHOS NEUTRAL) tablet 500 mg  500 mg Oral TID Reubin Milan, MD   500 mg at 03/06/21 2423   potassium chloride SA (KLOR-CON) CR tablet 20 mEq  20 mEq Oral Daily Barton Dubois, MD   20 mEq at 03/05/21 0903   sodium chloride flush (NS) 0.9 % injection 3 mL  3 mL Intravenous Q12H Barton Dubois, MD   3 mL at 03/05/21 2204   sodium chloride flush (NS) 0.9  % injection 3 mL  3 mL Intravenous PRN Barton Dubois, MD       sodium chloride tablet 1 g  1 g Oral BID WC Reesa Chew, MD          Review of Systems: 10 systems reviewed and negative except per interval history/subjective  Physical Exam: Vitals:   03/06/21 0441 03/06/21 0755  BP: (!) 175/90 (!) 168/77  Pulse: 76 80  Resp: 17 20  Temp: 98.1 F (36.7 C) 97.8 F (36.6 C)  SpO2: 96% 100%   No intake/output data recorded.  Intake/Output Summary (Last 24 hours) at 03/06/2021 0911 Last data filed at 03/06/2021 0700 Gross per 24 hour  Intake 530 ml  Output --  Net 530 ml   Constitutional: well-appearing, no acute distress ENMT: ears and nose without scars or lesions, MMM CV: normal rate, no edema Respiratory: Bilateral chest rise, normal work of breathing Gastrointestinal: soft, non-tender, no palpable masses or hernias Skin: no visible lesions or rashes Psych: alert, judgement/insight appropriate, appropriate mood and affect   Test Results I personally reviewed new and old clinical labs and radiology tests Lab Results  Component Value Date   NA 131 (L) 03/06/2021   K 4.6 03/06/2021   CL 101 03/06/2021   CO2 24 03/06/2021   BUN 8 03/06/2021   CREATININE 0.55 03/06/2021   CALCIUM 9.5 03/06/2021   ALBUMIN 3.4 (L) 03/05/2021   PHOS 2.1 (L) 03/05/2021   Recent Results (from the past 2160 hour(s))  CBC with Differential/Platelet     Status: Abnormal   Collection Time: 01/12/21  1:44 AM  Result Value Ref Range   WBC 45.5 (H) 4.0 - 10.5 K/uL   RBC 3.19 (L) 3.87 - 5.11 MIL/uL   Hemoglobin 12.0 12.0 - 15.0 g/dL   HCT 35.5 (L) 36.0 - 46.0 %   MCV 111.3 (H) 80.0 - 100.0 fL   MCH 37.6 (H) 26.0 - 34.0 pg   MCHC 33.8 30.0 - 36.0 g/dL   RDW 18.9 (H) 11.5 - 15.5 %   Platelets 219 150 - 400 K/uL   nRBC 0.0 0.0 - 0.2 %   Neutrophils Relative % 92 %   Neutro Abs 41.9 (H) 1.7 - 7.7 K/uL   Lymphocytes Relative 5 %   Lymphs Abs 2.3 0.7 - 4.0 K/uL   Monocytes Relative 3 %    Monocytes Absolute 1.4 (H) 0.1 - 1.0 K/uL   Eosinophils Relative 0 %   Eosinophils Absolute 0.0 0.0 - 0.5 K/uL   Basophils Relative 0 %   Basophils Absolute 0.0 0.0 - 0.1 K/uL    Comment: Performed at [facility], [address], [place], [place] [address]  Comprehensive metabolic panel     Status: Abnormal   Collection Time: 01/12/21  1:44 AM  Result Value Ref Range   Sodium  125 (L) 135 - 145 mmol/L   Potassium 4.5 3.5 - 5.1 mmol/L   Chloride 95 (L) 98 - 111 mmol/L   CO2 22 22 - 32 mmol/L   Glucose, Bld 113 (H) 70 - 99 mg/dL    Comment: Glucose reference range applies only to samples taken after fasting for at least 8 hours.   BUN 7 (L) 8 - 23 mg/dL   Creatinine, Ser 0.55 0.44 - 1.00 mg/dL   Calcium 8.6 (L) 8.9 - 10.3 mg/dL   Total Protein 7.6 6.5 - 8.1 g/dL   Albumin 4.0 3.5 - 5.0 g/dL   AST 73 (H) 15 - 41 U/L   ALT 34 0 - 44 U/L   Alkaline Phosphatase 92 38 - 126 U/L   Total Bilirubin 1.4 (H) 0.3 - 1.2 mg/dL   GFR, Estimated >60 >60 mL/min    Comment: (NOTE) Calculated using the CKD-EPI Creatinine Equation (2021)    Anion gap 8 5 - 15    Comment: Performed at [facility], [address] [place], [place] [address]  Troponin I (High Sensitivity)     Status: None   Collection Time: 01/12/21  1:44 AM  Result Value Ref Range   Troponin I (High Sensitivity) 11 <18 ng/L    Comment: (NOTE) Elevated high sensitivity troponin I (hsTnI) values and significant  changes across serial measurements may suggest ACS but many other  chronic and acute conditions are known to elevate hsTnI results.  Refer to the "Links" section for chest pain algorithms and additional  guidance. Performed at [facility], [address], [place], [place] [address]   Brain natriuretic peptide     Status: Abnormal   Collection Time: 01/12/21  1:45 AM  Result Value Ref Range   B Natriuretic Peptide 2,115.0 (H) 0.0 - 100.0 pg/mL    Comment: Performed at [facility], [address] [place], [place]  [address]  Blood gas, arterial (at [facility])     Status: Abnormal   Collection Time: 01/12/21  2:25 AM  Result Value Ref Range   FIO2 36.00    pH, Arterial 7.155 (LL) 7.350 - 7.450    Comment: CRITICAL RESULT CALLED TO, READ BACK BY AND VERIFIED WITH: PRUITT,G @ 0235 ON 01/12/21 BY JUW    pCO2 arterial 59.7 (H) 32.0 - 48.0 mmHg   pO2, Arterial 34.7 (LL) 83.0 - 108.0 mmHg    Comment: CRITICAL RESULT CALLED TO, READ BACK BY AND VERIFIED WITH: PRUITT,G @ 0235 ON 01/12/21 BY JUW    Bicarbonate 17.0 (L) 20.0 - 28.0 mmol/L   Acid-base deficit 7.3 (H) 0.0 - 2.0 mmol/L   O2 Saturation 54.3 %   Patient temperature 37.0    Allens test (pass/fail) NOT INDICATED (A) PASS    Comment: Performed at [facility], [address], [place], [place] [address]  Blood gas, arterial (at [facility])     Status: Abnormal   Collection Time: 01/12/21  3:03 AM  Result Value Ref Range   FIO2 36.00    pH, Arterial 7.303 (L) 7.350 - 7.450   pCO2 arterial 51.3 (H) 32.0 - 48.0 mmHg   pO2, Arterial 80.0 (L) 83.0 - 108.0 mmHg   Bicarbonate 23.1 20.0 - 28.0 mmol/L   Acid-base deficit 0.9 0.0 - 2.0 mmol/L   O2 Saturation 94.9 %   Patient temperature 37.0    Allens test (pass/fail) NOT INDICATED (A) PASS    Comment: Performed at [facility], [address], [place],  [place] [address]  Lactic acid, plasma     Status: None   Collection Time: 01/12/21  3:47 AM  Result Value Ref Range   Lactic Acid, Venous 1.5 0.5 - 1.9 mmol/L    Comment: Performed at [facility], [address] [place], [place] [address]  Blood culture (routine x 2)     Status: None   Collection Time: 01/12/21  3:47 AM   Specimen: Left Antecubital; Blood  Result Value Ref Range   Specimen Description LEFT ANTECUBITAL    Special Requests      BOTTLES DRAWN AEROBIC AND ANAEROBIC Blood Culture adequate volume   Culture      NO GROWTH 5 DAYS Performed at [facility], [address], [place], [place] [address]    Report Status 01/17/2021 FINAL   Blood  culture (routine x 2)     Status: None   Collection Time: 01/12/21  3:47 AM   Specimen: BLOOD LEFT FOREARM  Result Value Ref Range   Specimen Description BLOOD LEFT FOREARM    Special Requests      BOTTLES DRAWN AEROBIC AND ANAEROBIC Blood Culture results may not be optimal due to an inadequate volume of blood received in culture bottles   Culture      NO GROWTH 5 DAYS Performed at [facility], [address], [place], [place] [address]    Report Status 01/17/2021 FINAL   Troponin I (High Sensitivity)     Status: None   Collection Time: 01/12/21  3:47 AM  Result Value Ref Range   Troponin I (High Sensitivity) 14 <18 ng/L    Comment: (NOTE) Elevated high sensitivity troponin I (hsTnI) values and significant  changes across serial measurements may suggest ACS but many other  chronic and acute conditions are known to elevate hsTnI results.  Refer to the "Links" section for chest pain algorithms and additional  guidance. Performed at [facility], [address] [place],  [address]   Resp Panel by RT-PCR (Flu A&B, Covid) Nasopharyngeal Swab     Status: None   Collection Time: 01/12/21  5:06 AM   Specimen: Nasopharyngeal Swab; Nasopharyngeal(NP) swabs in vial transport medium  Result Value Ref Range   SARS Coronavirus 2 by RT PCR NEGATIVE NEGATIVE    Comment: (NOTE) SARS-CoV-2 target nucleic acids are NOT DETECTED.  The SARS-CoV-2 RNA is generally detectable in upper respiratory specimens during the acute phase of infection. The lowest concentration of SARS-CoV-2 viral copies this assay can detect is 138 copies/mL. A negative result does not preclude SARS-Cov-2 infection and should not be used as the sole basis for treatment or other patient management decisions. A negative result may occur with  improper specimen collection/handling, submission of specimen other than nasopharyngeal swab, presence of viral mutation(s) within the areas targeted by this assay, and inadequate  number of viral copies(<138 copies/mL). A negative result must be combined with clinical observations, patient history, and epidemiological information. The expected result is Negative.  Fact Sheet for Patients:  EntrepreneurPulse.com.au  Fact Sheet for Healthcare Providers:  IncredibleEmployment.be  This test is no t yet approved or cleared by the [place] FDA and  has been authorized for detection and/or diagnosis of SARS-CoV-2 by FDA under an Emergency Use Authorization (EUA). This EUA will remain  in effect (meaning this test can be used) for the duration of the COVID-19 declaration under Section 564(b)(1) of the Act, 21 U.S.C.section 360bbb-3(b)(1), unless the authorization is terminated  or revoked sooner.  Influenza A by PCR NEGATIVE NEGATIVE   Influenza B by PCR NEGATIVE NEGATIVE    Comment: (NOTE) The Xpert Xpress SARS-CoV-2/FLU/RSV plus assay is intended as an aid in the diagnosis of influenza from Nasopharyngeal swab specimens and should not be used as a sole basis for treatment. Nasal washings and aspirates are unacceptable for Xpert Xpress SARS-CoV-2/FLU/RSV testing.  Fact Sheet for Patients: EntrepreneurPulse.com.au  Fact Sheet for Healthcare Providers: IncredibleEmployment.be  This test is not yet approved or cleared by the [place] FDA and has been authorized for detection and/or diagnosis of SARS-CoV-2 by FDA under an Emergency Use Authorization (EUA). This EUA will remain in effect (meaning this test can be used) for the duration of the COVID-19 declaration under Section 564(b)(1) of the Act, 21 U.S.C. section 360bbb-3(b)(1), unless the authorization is terminated or revoked.  Performed at [facility], [address], [place], [place] [address]   Lactic acid, plasma     Status: None   Collection Time: 01/12/21  8:09 AM  Result Value Ref Range   Lactic Acid, Venous  1.4 0.5 - 1.9 mmol/L    Comment: Performed at [facility], [address], [place], [place] [address]  HIV Antibody (routine testing w rflx)     Status: None   Collection Time: 01/12/21  8:09 AM  Result Value Ref Range   HIV Screen 4th Generation wRfx Non Reactive Non Reactive    Comment: Performed at [facility], [place] [address], [place], [place] [address]  TSH     Status: None   Collection Time: 01/12/21  8:09 AM  Result Value Ref Range   TSH 0.934 0.350 - 4.500 uIU/mL    Comment: Performed by a 3rd Generation assay with a functional sensitivity of <=0.01 uIU/mL. Performed at [facility], [address], [place], [place] [address]   MRSA PCR Screening     Status: None   Collection Time: 01/12/21  9:17 AM   Specimen: Nasal Mucosa; Nasopharyngeal  Result Value Ref Range   MRSA by PCR NEGATIVE NEGATIVE    Comment:        The GeneXpert MRSA Assay (FDA approved for NASAL specimens only), is one component of a comprehensive MRSA colonization surveillance program. It is not intended to diagnose MRSA infection nor to guide or monitor treatment for MRSA infections. Performed at [facility], [address], [place], [place] [address]   Urinalysis, Routine w reflex microscopic Urine, Clean Catch     Status: Abnormal   Collection Time: 01/12/21  9:18 AM  Result Value Ref Range   Color, Urine YELLOW YELLOW   APPearance CLEAR CLEAR   Specific Gravity, Urine 1.030 1.005 - 1.030   pH 6.0 5.0 - 8.0   Glucose, UA NEGATIVE NEGATIVE mg/dL   Hgb urine dipstick MODERATE (A) NEGATIVE   Bilirubin Urine NEGATIVE NEGATIVE   Ketones, ur NEGATIVE NEGATIVE mg/dL   Protein, ur 30 (A) NEGATIVE mg/dL   Nitrite NEGATIVE NEGATIVE   Leukocytes,Ua NEGATIVE NEGATIVE   RBC / HPF 0-5 0 - 5 RBC/hpf   WBC, UA 0-5 0 - 5 WBC/hpf   Bacteria, UA NONE SEEN NONE SEEN   Squamous Epithelial / LPF 0-5 0 - 5   Mucus PRESENT    Hyaline Casts, UA PRESENT     Comment: Performed at [facility], [address], [place], [place] [address]  ECHOCARDIOGRAM COMPLETE     Status: None   Collection Time: 01/12/21  2:08 PM  Result Value Ref Range  Weight 1,936.52 oz   Height 69 in   BP 130/60 mmHg   Area-P 1/2 3.43 cm2   S' Lateral 3.40 cm   AR max vel 1.71 cm2   AV Area mean vel 1.98 cm2   AV Area VTI 2.06 cm2   Ao pk vel 1.44 m/s   AV Peak grad 8.2 mmHg   AV Mean grad 3.9 mmHg  Comprehensive metabolic panel     Status: Abnormal   Collection Time: 01/13/21  4:03 AM  Result Value Ref Range   Sodium 128 (L) 135 - 145 mmol/L   Potassium 3.6 3.5 - 5.1 mmol/L    Comment: DELTA CHECK NOTED   Chloride 93 (L) 98 - 111 mmol/L   CO2 22 22 - 32 mmol/L   Glucose, Bld 127 (H) 70 - 99 mg/dL    Comment: Glucose reference range applies only to samples taken after fasting for at least 8 hours.   BUN 12 8 - 23 mg/dL   Creatinine, Ser 0.66 0.44 - 1.00 mg/dL   Calcium 8.9 8.9 - 10.3 mg/dL   Total Protein 7.3 6.5 - 8.1 g/dL   Albumin 3.9 3.5 - 5.0 g/dL   AST 34 15 - 41 U/L   ALT 31 0 - 44 U/L   Alkaline Phosphatase 118 38 - 126 U/L   Total Bilirubin 0.7 0.3 - 1.2 mg/dL   GFR, Estimated >60 >60 mL/min    Comment: (NOTE) Calculated using the CKD-EPI Creatinine Equation (2021)    Anion gap 13 5 - 15    Comment: Performed at [facility], [address], [place], [place] [address]  Magnesium     Status: None   Collection Time: 01/13/21  4:03 AM  Result Value Ref Range   Magnesium 2.0 1.7 - 2.4 mg/dL    Comment: Performed at [facility], [address], [place], [place] [address]  CBC WITH DIFFERENTIAL     Status: Abnormal   Collection Time: 01/13/21  4:03 AM  Result Value Ref Range   WBC 53.0 (HH) 4.0 - 10.5 K/uL    Comment: This critical result has verified and been called to [facility] by Susann Givens on 04 19 2022 at [place], and has been read back.    RBC 2.91 (L) 3.87 - 5.11 MIL/uL   Hemoglobin 11.0 (L) 12.0 - 15.0 g/dL   HCT 32.7 (L) 36.0 - 46.0 %   MCV 112.4 (H) 80.0 - 100.0 fL   MCH 37.8 (H)  26.0 - 34.0 pg   MCHC 33.6 30.0 - 36.0 g/dL   RDW 19.1 (H) 11.5 - 15.5 %   Platelets 162 150 - 400 K/uL   nRBC 0.0 0.0 - 0.2 %   Neutrophils Relative % 69 %   Neutro Abs 50.4 (H) 1.7 - 7.7 K/uL   Band Neutrophils 26 %   Lymphocytes Relative 4 %   Lymphs Abs 2.1 0.7 - 4.0 K/uL   Monocytes Relative 0 %   Monocytes Absolute 0.0 (L) 0.1 - 1.0 K/uL   Eosinophils Relative 0 %   Eosinophils Absolute 0.0 0.0 - 0.5 K/uL   Basophils Relative 0 %   Basophils Absolute 0.0 0.0 - 0.1 K/uL   WBC Morphology DOHLE BODIES     Comment: INCREASED BANDS (>20% BANDS) TOXIC GRANULATION VACUOLATED NEUTROPHILS    Metamyelocytes Relative 1 %   Dohle Bodies PRESENT     Comment: Performed at [facility], [address] [place], [place] [address]  Basic metabolic panel  Status: Abnormal   Collection Time: 01/14/21  5:04 PM  Result Value Ref Range   Sodium 132 (L) 135 - 145 mmol/L   Potassium 3.8 3.5 - 5.1 mmol/L   Chloride 99 98 - 111 mmol/L   CO2 25 22 - 32 mmol/L   Glucose, Bld 114 (H) 70 - 99 mg/dL    Comment: Glucose reference range applies only to samples taken after fasting for at least 8 hours.   BUN 20 8 - 23 mg/dL   Creatinine, Ser 0.80 0.44 - 1.00 mg/dL   Calcium 8.5 (L) 8.9 - 10.3 mg/dL   GFR, Estimated >60 >60 mL/min    Comment: (NOTE) Calculated using the CKD-EPI Creatinine Equation (2021)    Anion gap 8 5 - 15    Comment: Performed at [facility], [address], [place], [place] [address]  CBC     Status: Abnormal   Collection Time: 01/14/21  5:04 PM  Result Value Ref Range   WBC 68.5 (HH) 4.0 - 10.5 K/uL    Comment: This critical result has verified and been called to [facility] by Mady Haagensen on 04 20 2022 at 1713, and has been read back.    RBC 3.01 (L) 3.87 - 5.11 MIL/uL   Hemoglobin 11.4 (L) 12.0 - 15.0 g/dL   HCT 33.5 (L) 36.0 - 46.0 %   MCV 111.3 (H) 80.0 - 100.0 fL   MCH 37.9 (H) 26.0 - 34.0 pg   MCHC 34.0 30.0 - 36.0 g/dL   RDW 19.1 (H) 11.5 - 15.5 %    Platelets 130 (L) 150 - 400 K/uL    Comment: Immature Platelet Fraction may be clinically indicated, consider ordering this additional test [id number]    nRBC 0.2 0.0 - 0.2 %    Comment: Performed at [facility], [address], [place], [place] [address]  Procalcitonin - Baseline     Status: None   Collection Time: 01/14/21  5:04 PM  Result Value Ref Range   Procalcitonin 0.35 ng/mL    Comment:        Interpretation: PCT (Procalcitonin) <= 0.5 ng/mL: Systemic infection (sepsis) is not likely. Local bacterial infection is possible. (NOTE)       Sepsis PCT Algorithm           Lower Respiratory Tract                                      Infection PCT Algorithm    ----------------------------     ----------------------------         PCT < 0.25 ng/mL                PCT < 0.10 ng/mL          Strongly encourage             Strongly discourage   discontinuation of antibiotics    initiation of antibiotics    ----------------------------     -----------------------------       PCT 0.25 - 0.50 ng/mL            PCT 0.10 - 0.25 ng/mL               OR       >80% decrease in PCT            Discourage initiation of  antibiotics      Encourage discontinuation           of antibiotics    ----------------------------     -----------------------------         PCT >= 0.50 ng/mL              PCT 0.26 - 0.50 ng/mL               AND        <80% decrease in PCT             Encourage initiation of                                             antibiotics       Encourage continuation           of antibiotics    ----------------------------     -----------------------------        PCT >= 0.50 ng/mL                  PCT > 0.50 ng/mL               AND         increase in PCT                  Strongly encourage                                      initiation of antibiotics    Strongly encourage escalation           of antibiotics                                      -----------------------------                                           PCT <= 0.25 ng/mL                                                 OR                                        > 80% decrease in PCT                                      Discontinue / Do not initiate                                             antibiotics  Performed at [facility], [address] [place], [place] [address]   Basic metabolic panel     Status: Abnormal   Collection Time:  01/15/21  5:43 AM  Result Value Ref Range   Sodium 134 (L) 135 - 145 mmol/L   Potassium 3.0 (L) 3.5 - 5.1 mmol/L    Comment: DELTA CHECK NOTED   Chloride 100 98 - 111 mmol/L   CO2 25 22 - 32 mmol/L   Glucose, Bld 93 70 - 99 mg/dL    Comment: Glucose reference range applies only to samples taken after fasting for at least 8 hours.   BUN 20 8 - 23 mg/dL   Creatinine, Ser 0.57 0.44 - 1.00 mg/dL   Calcium 8.3 (L) 8.9 - 10.3 mg/dL   GFR, Estimated >60 >60 mL/min    Comment: (NOTE) Calculated using the CKD-EPI Creatinine Equation (2021)    Anion gap 9 5 - 15    Comment: Performed at [facility], [address], [place], [place] [address]  Magnesium     Status: None   Collection Time: 01/15/21  5:43 AM  Result Value Ref Range   Magnesium 2.2 1.7 - 2.4 mg/dL    Comment: Performed at [facility], [address], [place], [place] [address]  CBC with Differential/Platelet     Status: Abnormal   Collection Time: 01/15/21  5:43 AM  Result Value Ref Range   WBC 71.6 (HH) 4.0 - 10.5 K/uL    Comment: This critical result has verified and been called to [facility] by Susann Givens on 04 21 2022 at 0759, and has been read back.    RBC 2.98 (L) 3.87 - 5.11 MIL/uL   Hemoglobin 11.1 (L) 12.0 - 15.0 g/dL   HCT 33.4 (L) 36.0 - 46.0 %   MCV 112.1 (H) 80.0 - 100.0 fL   MCH 37.2 (H) 26.0 - 34.0 pg   MCHC 33.2 30.0 - 36.0 g/dL   RDW 19.5 (H) 11.5 - 15.5 %   Platelets 115 (L) 150 - 400 K/uL    Comment: Immature Platelet Fraction may  be clinically indicated, consider ordering this additional test [id number]    nRBC 0.2 0.0 - 0.2 %   Neutrophils Relative % 85 %   Neutro Abs 60.7 (H) 1.7 - 7.7 K/uL   Lymphocytes Relative 4 %   Lymphs Abs 3.1 0.7 - 4.0 K/uL   Monocytes Relative 5 %   Monocytes Absolute 3.7 (H) 0.1 - 1.0 K/uL   Eosinophils Relative 0 %   Eosinophils Absolute 0.0 0.0 - 0.5 K/uL   Basophils Relative 0 %   Basophils Absolute 0.0 0.0 - 0.1 K/uL   WBC Morphology DOHLE BODIES     Comment: TOXIC GRANULATION VACUOLATED NEUTROPHILS    Immature Granulocytes 6 %   Abs Immature Granulocytes 4.01 (H) 0.00 - 0.07 K/uL   Dohle Bodies PRESENT     Comment: Performed at [facility], [address], [place], [place] [address]  Vitamin B12     Status: Abnormal   Collection Time: 01/15/21  5:43 AM  Result Value Ref Range   Vitamin B-12 2,585 (H) 180 - 914 pg/mL    Comment: RESULTS CONFIRMED BY MANUAL DILUTION Performed at [facility], [address] [place], [place] [address]   Folate     Status: None   Collection Time: 01/15/21  5:43 AM  Result Value Ref Range   Folate 11.1 >5.9 ng/mL    Comment: Performed at [facility], [address], [place], [place] [address]  Basic metabolic panel     Status: Abnormal   Collection Time: 03/03/21 12:02 AM  Result Value Ref Range  Sodium 118 (LL) 135 - 145 mmol/L    Comment: CRITICAL RESULT CALLED TO, READ BACK BY AND VERIFIED WITH: H SPENCE,RN@0055  03/03/21 MKELLY    Potassium 3.3 (L) 3.5 - 5.1 mmol/L   Chloride 88 (L) 98 - 111 mmol/L   CO2 22 22 - 32 mmol/L   Glucose, Bld 115 (H) 70 - 99 mg/dL    Comment: Glucose reference range applies only to samples taken after fasting for at least 8 hours.   BUN 10 8 - 23 mg/dL   Creatinine, Ser 0.54 0.44 - 1.00 mg/dL   Calcium 8.1 (L) 8.9 - 10.3 mg/dL   GFR, Estimated >60 >60 mL/min    Comment: (NOTE) Calculated using the CKD-EPI Creatinine Equation (2021)    Anion gap 8 5 - 15    Comment: Performed at [facility], [address], [place], [place] [address]  CBC with Differential/Platelet     Status: Abnormal   Collection Time: 03/03/21 12:02 AM  Result Value Ref Range   WBC 28.7 (H) 4.0 - 10.5 K/uL    Comment: WHITE COUNT CONFIRMED ON SMEAR   RBC 2.61 (L) 3.87 - 5.11 MIL/uL   Hemoglobin 10.2 (L) 12.0 - 15.0 g/dL   HCT 28.4 (L) 36.0 - 46.0 %   MCV 108.8 (H) 80.0 - 100.0 fL   MCH 39.1 (H) 26.0 - 34.0 pg   MCHC 35.9 30.0 - 36.0 g/dL   RDW 15.9 (H) 11.5 - 15.5 %   Platelets 117 (L) 150 - 400 K/uL    Comment: Immature Platelet Fraction may be clinically indicated, consider ordering this additional test [id number] PLATELET COUNT CONFIRMED BY SMEAR    nRBC 0.0 0.0 - 0.2 %   Neutrophils Relative % 96 %   Neutro Abs 27.4 (H) 1.7 - 7.7 K/uL   Lymphocytes Relative 3 %   Lymphs Abs 0.9 0.7 - 4.0 K/uL   Monocytes Relative 1 %   Monocytes Absolute 0.3 0.1 - 1.0 K/uL   Eosinophils Relative 0 %   Eosinophils Absolute 0.1 0.0 - 0.5 K/uL   Basophils Relative 0 %   Basophils Absolute 0.0 0.0 - 0.1 K/uL   Immature Granulocytes 0 %   Abs Immature Granulocytes 0.00 0.00 - 0.07 K/uL   Reactive, Benign Lymphocytes PRESENT    Tammy Sours Bodies PRESENT     Comment: Performed at [facility], [address], [place], [place] [address]  Brain natriuretic peptide     Status: Abnormal   Collection Time: 03/03/21 12:02 AM  Result Value Ref Range   B Natriuretic Peptide 1,352.0 (H) 0.0 - 100.0 pg/mL    Comment: Performed at [facility], [address], [place], [place] [address]  Magnesium     Status: Abnormal   Collection Time: 03/03/21 12:02 AM  Result Value Ref Range   Magnesium 1.5 (L) 1.7 - 2.4 mg/dL    Comment: Performed at [facility], [address] [place], [place] [address]  Phosphorus     Status: None   Collection Time: 03/03/21 12:02 AM  Result Value Ref Range   Phosphorus 2.5 2.5 - 4.6 mg/dL    Comment: Performed at [facility], [address], [place], [place] [address]  Resp Panel by RT-PCR  (Flu A&B, Covid) Nasopharyngeal Swab     Status: None   Collection Time: 03/03/21  3:18 AM   Specimen: Nasopharyngeal Swab; Nasopharyngeal(NP) swabs in vial transport medium  Result Value Ref Range   SARS Coronavirus 2 by RT PCR NEGATIVE  NEGATIVE    Comment: (NOTE) SARS-CoV-2 target nucleic acids are NOT DETECTED.  The SARS-CoV-2 RNA is generally detectable in upper respiratory specimens during the acute phase of infection. The lowest concentration of SARS-CoV-2 viral copies this assay can detect is 138 copies/mL. A negative result does not preclude SARS-Cov-2 infection and should not be used as the sole basis for treatment or other patient management decisions. A negative result may occur with  improper specimen collection/handling, submission of specimen other than nasopharyngeal swab, presence of viral mutation(s) within the areas targeted by this assay, and inadequate number of viral copies(<138 copies/mL). A negative result must be combined with clinical observations, patient history, and epidemiological information. The expected result is Negative.  Fact Sheet for Patients:  EntrepreneurPulse.com.au  Fact Sheet for Healthcare Providers:  IncredibleEmployment.be  This test is no t yet approved or cleared by the [place] FDA and  has been authorized for detection and/or diagnosis of SARS-CoV-2 by FDA under an Emergency Use Authorization (EUA). This EUA will remain  in effect (meaning this test can be used) for the duration of the COVID-19 declaration under Section 564(b)(1) of the Act, 21 U.S.C.section 360bbb-3(b)(1), unless the authorization is terminated  or revoked sooner.       Influenza A by PCR NEGATIVE NEGATIVE   Influenza B by PCR NEGATIVE NEGATIVE    Comment: (NOTE) The Xpert Xpress SARS-CoV-2/FLU/RSV plus assay is intended as an aid in the diagnosis of influenza from Nasopharyngeal swab specimens and should not be used as  a sole basis for treatment. Nasal washings and aspirates are unacceptable for Xpert Xpress SARS-CoV-2/FLU/RSV testing.  Fact Sheet for Patients: EntrepreneurPulse.com.au  Fact Sheet for Healthcare Providers: IncredibleEmployment.be  This test is not yet approved or cleared by the [place] FDA and has been authorized for detection and/or diagnosis of SARS-CoV-2 by FDA under an Emergency Use Authorization (EUA). This EUA will remain in effect (meaning this test can be used) for the duration of the COVID-19 declaration under Section 564(b)(1) of the Act, 21 U.S.C. section 360bbb-3(b)(1), unless the authorization is terminated or revoked.  Performed at [facility], [address], [place], [place] [address]   Urinalysis, Routine w reflex microscopic Urine, Random     Status: Abnormal   Collection Time: 03/03/21  3:58 AM  Result Value Ref Range   Color, Urine YELLOW YELLOW   APPearance CLEAR CLEAR   Specific Gravity, Urine 1.013 1.005 - 1.030   pH 8.0 5.0 - 8.0   Glucose, UA NEGATIVE NEGATIVE mg/dL   Hgb urine dipstick LARGE (A) NEGATIVE   Bilirubin Urine NEGATIVE NEGATIVE   Ketones, ur 20 (A) NEGATIVE mg/dL   Protein, ur >=300 (A) NEGATIVE mg/dL   Nitrite NEGATIVE NEGATIVE   Leukocytes,Ua NEGATIVE NEGATIVE   RBC / HPF >50 (H) 0 - 5 RBC/hpf   WBC, UA 6-10 0 - 5 WBC/hpf   Bacteria, UA RARE (A) NONE SEEN   Squamous Epithelial / LPF 0-5 0 - 5   Budding Yeast PRESENT    Amorphous Crystal PRESENT     Comment: Performed at [facility], [address] [place], [place] [address]  Osmolality, urine     Status: None   Collection Time: 03/03/21  3:58 AM  Result Value Ref Range   Osmolality, Ur  300 - 900 mOsm/kg    QUESTIONABLE RESULTS, RECOMMEND RECOLLECT TO VERIFY    Comment: Performed at [facility], [place] [address] [place], [place] [address] CORRECTED ON 06/07 AT 1945: PREVIOUSLY  REPORTED AS 283   Sodium, urine, random     Status:  None   Collection Time: 03/03/21  5:56 AM  Result Value Ref Range   Sodium, Ur 25 mmol/L    Comment: Performed at [facility], [address], [place], [place] [address]  Cortisol     Status: None   Collection Time: 03/03/21  9:15 AM  Result Value Ref Range   Cortisol, Plasma 6.1 ug/dL    Comment: (NOTE) AM    6.7 - 22.6 ug/dL PM   <10.0       ug/dL Performed at [facility] [address], [place], [place] [address]   Osmolality     Status: Abnormal   Collection Time: 03/03/21  9:15 AM  Result Value Ref Range   Osmolality 253 (L) 275 - 295 mOsm/kg    Comment: Performed at [facility], [place] [address], [place], [place] [address]  Basic metabolic panel     Status: Abnormal   Collection Time: 03/03/21 12:01 PM  Result Value Ref Range   Sodium 122 (L) 135 - 145 mmol/L   Potassium 3.9 3.5 - 5.1 mmol/L   Chloride 93 (L) 98 - 111 mmol/L   CO2 20 (L) 22 - 32 mmol/L   Glucose, Bld 99 70 - 99 mg/dL    Comment: Glucose reference range applies only to samples taken after fasting for at least 8 hours.   BUN 7 (L) 8 - 23 mg/dL   Creatinine, Ser 0.42 (L) 0.44 - 1.00 mg/dL   Calcium 8.4 (L) 8.9 - 10.3 mg/dL   GFR, Estimated >60 >60 mL/min    Comment: (NOTE) Calculated using the CKD-EPI Creatinine Equation (2021)    Anion gap 9 5 - 15    Comment: Performed at [facility], [address] [place], [place] [address]  Osmolality, urine     Status: None   Collection Time: 03/03/21  3:38 PM  Result Value Ref Range   Osmolality, Ur 338 300 - 900 mOsm/kg    Comment: Performed at [facility] [address], [place], [place] [address]  Glucose, capillary     Status: Abnormal   Collection Time: 03/03/21 11:02 PM  Result Value Ref Range   Glucose-Capillary 106 (H) 70 - 99 mg/dL    Comment: Glucose reference range applies only to samples taken after fasting for at least 8 hours.  MRSA PCR Screening     Status: None   Collection Time: 03/04/21 12:30 AM   Specimen:  Nasopharyngeal  Result Value Ref Range   MRSA by PCR NEGATIVE NEGATIVE    Comment:        The GeneXpert MRSA Assay (FDA approved for NASAL specimens only), is one component of a comprehensive MRSA colonization surveillance program. It is not intended to diagnose MRSA infection nor to guide or monitor treatment for MRSA infections. Performed at [facility], [address] [place], [place] [address]   Folate     Status: None   Collection Time: 03/04/21 12:30 AM  Result Value Ref Range   Folate 8.8 >5.9 ng/mL    Comment: Performed at [facility], [address] [place], [place] [address]  Vitamin B12     Status: Abnormal   Collection Time: 03/04/21 12:30 AM  Result Value Ref Range   Vitamin B-12 1,003 (H) 180 - 914 pg/mL    Comment: (NOTE) This assay is not validated for testing neonatal or myeloproliferative syndrome specimens for Vitamin B12  levels. Performed at [facility], [address], [place], [place] [address]   Troponin I (High Sensitivity)     Status: None   Collection Time: 03/04/21 12:30 AM  Result Value Ref Range   Troponin I (High Sensitivity) 11 <18 ng/L    Comment: (NOTE) Elevated high sensitivity troponin I (hsTnI) values and significant  changes across serial measurements may suggest ACS but many other  chronic and acute conditions are known to elevate hsTnI results.  Refer to the "Links" section for chest pain algorithms and additional  guidance. Performed at [facility], [address], [place], [place] [address]   Comprehensive metabolic panel     Status: Abnormal   Collection Time: 03/04/21  2:50 AM  Result Value Ref Range   Sodium 122 (L) 135 - 145 mmol/L   Potassium 3.8 3.5 - 5.1 mmol/L   Chloride 96 (L) 98 - 111 mmol/L   CO2 21 (L) 22 - 32 mmol/L   Glucose, Bld 131 (H) 70 - 99 mg/dL    Comment: Glucose reference range applies only to samples taken after fasting for at least 8 hours.   BUN 7 (L) 8 - 23 mg/dL   Creatinine, Ser 0.39 (L) 0.44 -  1.00 mg/dL   Calcium 7.7 (L) 8.9 - 10.3 mg/dL   Total Protein 5.3 (L) 6.5 - 8.1 g/dL   Albumin 3.1 (L) 3.5 - 5.0 g/dL   AST 32 15 - 41 U/L   ALT 24 0 - 44 U/L   Alkaline Phosphatase 96 38 - 126 U/L   Total Bilirubin 0.9 0.3 - 1.2 mg/dL   GFR, Estimated >60 >60 mL/min    Comment: (NOTE) Calculated using the CKD-EPI Creatinine Equation (2021)    Anion gap 5 5 - 15    Comment: Performed at [facility], [address] [place], [place] [address]  Troponin I (High Sensitivity)     Status: None   Collection Time: 03/04/21  2:50 AM  Result Value Ref Range   Troponin I (High Sensitivity) 12 <18 ng/L    Comment: (NOTE) Elevated high sensitivity troponin I (hsTnI) values and significant  changes across serial measurements may suggest ACS but many other  chronic and acute conditions are known to elevate hsTnI results.  Refer to the "Links" section for chest pain algorithms and additional  guidance. Performed at [facility], [address], [place], [place] [address]   CBC     Status: Abnormal   Collection Time: 03/04/21  4:19 AM  Result Value Ref Range   WBC 31.2 (H) 4.0 - 10.5 K/uL   RBC 2.09 (L) 3.87 - 5.11 MIL/uL   Hemoglobin 8.3 (L) 12.0 - 15.0 g/dL   HCT 23.4 (L) 36.0 - 46.0 %   MCV 112.0 (H) 80.0 - 100.0 fL   MCH 39.7 (H) 26.0 - 34.0 pg   MCHC 35.5 30.0 - 36.0 g/dL   RDW 15.9 (H) 11.5 - 15.5 %   Platelets 75 (L) 150 - 400 K/uL    Comment: SPECIMEN CHECKED FOR CLOTS Immature Platelet Fraction may be clinically indicated, consider ordering this additional test [id number] PLATELET COUNT CONFIRMED BY SMEAR    nRBC 0.0 0.0 - 0.2 %    Comment: Performed at [facility], [address] [place], [place] [address]  Protime-INR     Status: Abnormal   Collection Time: 03/04/21  4:19 AM  Result Value Ref Range   Prothrombin Time 19.2 (H) 11.4 - 15.2 seconds   INR 1.6 (  H) 0.8 - 1.2    Comment: (NOTE) INR goal varies based on device and disease states. Performed at [facility], [address] [place], [place] [address]   APTT     Status: Abnormal   Collection Time: 03/04/21  4:19 AM  Result Value Ref Range   aPTT 39 (H) 24 - 36 seconds    Comment:        IF BASELINE aPTT IS ELEVATED, SUGGEST PATIENT RISK ASSESSMENT BE USED TO DETERMINE APPROPRIATE ANTICOAGULANT THERAPY. Performed at [facility], [address], [place], [place] [address]   Renal function panel     Status: Abnormal   Collection Time: 03/05/21  9:16 AM  Result Value Ref Range   Sodium 125 (L) 135 - 145 mmol/L   Potassium 3.5 3.5 - 5.1 mmol/L   Chloride 96 (L) 98 - 111 mmol/L   CO2 23 22 - 32 mmol/L   Glucose, Bld 89 70 - 99 mg/dL    Comment: Glucose reference range applies only to samples taken after fasting for at least 8 hours.   BUN 6 (L) 8 - 23 mg/dL   Creatinine, Ser 0.49 0.44 - 1.00 mg/dL   Calcium 8.5 (L) 8.9 - 10.3 mg/dL   Phosphorus 1.8 (L) 2.5 - 4.6 mg/dL   Albumin 3.4 (L) 3.5 - 5.0 g/dL   GFR, Estimated >60 >60 mL/min    Comment: (NOTE) Calculated using the CKD-EPI Creatinine Equation (2021)    Anion gap 6 5 - 15    Comment: Performed at [facility], [address] [place], [place] [address]  Basic metabolic panel     Status: Abnormal   Collection Time: 03/05/21  9:16 AM  Result Value Ref Range   Sodium 126 (L) 135 - 145 mmol/L   Potassium 3.5 3.5 - 5.1 mmol/L   Chloride 96 (L) 98 - 111 mmol/L   CO2 23 22 - 32 mmol/L   Glucose, Bld 85 70 - 99 mg/dL    Comment: Glucose reference range applies only to samples taken after fasting for at least 8 hours.   BUN 6 (L) 8 - 23 mg/dL   Creatinine, Ser 0.40 (L) 0.44 - 1.00 mg/dL   Calcium 8.4 (L) 8.9 - 10.3 mg/dL   GFR, Estimated >60 >60 mL/min    Comment: (NOTE) Calculated using the CKD-EPI Creatinine Equation (2021)    Anion gap 7 5 - 15    Comment: Performed at [facility], [address], [place], [place] [address]  Osmolality     Status: Abnormal   Collection Time: 03/05/21  9:20 AM  Result Value Ref Range    Osmolality 260 (L) 275 - 295 mOsm/kg    Comment: Performed at [facility] [address] [place], [place] [address]  Basic metabolic panel     Status: Abnormal   Collection Time: 03/05/21  6:00 PM  Result Value Ref Range   Sodium 137 135 - 145 mmol/L    Comment: DELTA CHECK NOTED   Potassium <2.0 (LL) 3.5 - 5.1 mmol/L    Comment: DELTA CHECK NOTED CRITICAL RESULT CALLED TO, READ BACK BY AND VERIFIED WITH: COLEMAN,L ON 03/05/21 AT 1855 BY LOY,C    Chloride 123 (H) 98 - 111 mmol/L   CO2 14 (L) 22 - 32 mmol/L   Glucose, Bld 63 (L) 70 - 99 mg/dL    Comment: Glucose reference range applies only to samples taken after fasting for at least 8 hours.   BUN 5 (  L) 8 - 23 mg/dL   Creatinine, Ser <0.30 (L) 0.44 - 1.00 mg/dL   Calcium 4.3 (LL) 8.9 - 10.3 mg/dL    Comment: DELTA CHECK NOTED CRITICAL RESULT CALLED TO, READ BACK BY AND VERIFIED WITH: COLEMAN,L ON 03/05/21 AT 1855 BY LOY,C    GFR, Estimated NOT CALCULATED >60 mL/min    Comment: (NOTE) Calculated using the CKD-EPI Creatinine Equation (2021)    Anion gap <3 (L) 5 - 15    Comment: Electrolytes repeated to confirm. Performed at [facility], [address], [place], [place] [address]   Basic metabolic panel     Status: Abnormal   Collection Time: 03/05/21  9:40 PM  Result Value Ref Range   Sodium 126 (L) 135 - 145 mmol/L    Comment: DELTA CHECK NOTED   Potassium 3.9 3.5 - 5.1 mmol/L    Comment: DELTA CHECK NOTED   Chloride 98 98 - 111 mmol/L   CO2 22 22 - 32 mmol/L   Glucose, Bld 111 (H) 70 - 99 mg/dL    Comment: Glucose reference range applies only to samples taken after fasting for at least 8 hours.   BUN 9 8 - 23 mg/dL   Creatinine, Ser 0.72 0.44 - 1.00 mg/dL   Calcium 9.0 8.9 - 10.3 mg/dL    Comment: DELTA CHECK NOTED   GFR, Estimated >60 >60 mL/min    Comment: (NOTE) Calculated using the CKD-EPI Creatinine Equation (2021)    Anion gap 6 5 - 15    Comment: Performed at [facility], [address]  [place], [place] [address]  Magnesium     Status: None   Collection Time: 03/05/21  9:40 PM  Result Value Ref Range   Magnesium 1.7 1.7 - 2.4 mg/dL    Comment: Performed at [facility], [address], [place], [place] [address]  Phosphorus     Status: Abnormal   Collection Time: 03/05/21  9:40 PM  Result Value Ref Range   Phosphorus 2.1 (L) 2.5 - 4.6 mg/dL    Comment: Performed at [facility], [address], [place], [place] [address]  Basic metabolic panel     Status: Abnormal   Collection Time: 03/06/21  5:17 AM  Result Value Ref Range   Sodium 131 (L) 135 - 145 mmol/L   Potassium 4.6 3.5 - 5.1 mmol/L   Chloride 101 98 - 111 mmol/L   CO2 24 22 - 32 mmol/L   Glucose, Bld 103 (H) 70 - 99 mg/dL    Comment: Glucose reference range applies only to samples taken after fasting for at least 8 hours.   BUN 8 8 - 23 mg/dL   Creatinine, Ser 0.55 0.44 - 1.00 mg/dL   Calcium 9.5 8.9 - 10.3 mg/dL   GFR, Estimated >60 >60 mL/min    Comment: (NOTE) Calculated using the CKD-EPI Creatinine Equation (2021)    Anion gap 6 5 - 15    Comment: Performed at [facility], [address], [place], [place] [address]

## 2021-03-10 ENCOUNTER — Telehealth: Payer: Self-pay

## 2021-03-10 NOTE — Telephone Encounter (Signed)
Transition Care Management Unsuccessful Follow-up Telephone Call  Date of discharge and from where:  03/06/21    Attempts:  1st Attempt  Reason for unsuccessful TCM follow-up call:  Left voice message

## 2021-03-12 DIAGNOSIS — Z9221 Personal history of antineoplastic chemotherapy: Secondary | ICD-10-CM | POA: Diagnosis not present

## 2021-03-12 DIAGNOSIS — F17210 Nicotine dependence, cigarettes, uncomplicated: Secondary | ICD-10-CM | POA: Diagnosis not present

## 2021-03-12 DIAGNOSIS — E871 Hypo-osmolality and hyponatremia: Secondary | ICD-10-CM | POA: Diagnosis not present

## 2021-03-12 DIAGNOSIS — C7951 Secondary malignant neoplasm of bone: Secondary | ICD-10-CM | POA: Diagnosis not present

## 2021-03-12 DIAGNOSIS — I5031 Acute diastolic (congestive) heart failure: Secondary | ICD-10-CM | POA: Diagnosis not present

## 2021-03-12 DIAGNOSIS — G893 Neoplasm related pain (acute) (chronic): Secondary | ICD-10-CM | POA: Diagnosis not present

## 2021-03-12 DIAGNOSIS — C3491 Malignant neoplasm of unspecified part of right bronchus or lung: Secondary | ICD-10-CM | POA: Diagnosis not present

## 2021-03-12 DIAGNOSIS — C799 Secondary malignant neoplasm of unspecified site: Secondary | ICD-10-CM | POA: Diagnosis not present

## 2021-03-12 DIAGNOSIS — Z923 Personal history of irradiation: Secondary | ICD-10-CM | POA: Diagnosis not present

## 2021-03-12 DIAGNOSIS — I4891 Unspecified atrial fibrillation: Secondary | ICD-10-CM | POA: Diagnosis not present

## 2021-03-17 ENCOUNTER — Telehealth: Payer: Self-pay | Admitting: Internal Medicine

## 2021-03-17 NOTE — Telephone Encounter (Signed)
Left message for patient to call back and schedule Medicare Annual Wellness Visit (AWV) either virtually or in office.   AWV-I PER [place] 06/27/2020 please schedule at anytime with [facility]  health coach  This should be a 40 minute visit.

## 2021-03-24 DIAGNOSIS — I480 Paroxysmal atrial fibrillation: Secondary | ICD-10-CM | POA: Diagnosis not present

## 2021-03-24 DIAGNOSIS — E871 Hypo-osmolality and hyponatremia: Secondary | ICD-10-CM | POA: Diagnosis not present

## 2021-04-17 DIAGNOSIS — I4819 Other persistent atrial fibrillation: Secondary | ICD-10-CM | POA: Diagnosis not present

## 2021-04-17 DIAGNOSIS — I272 Pulmonary hypertension, unspecified: Secondary | ICD-10-CM | POA: Diagnosis not present

## 2021-04-17 DIAGNOSIS — I081 Rheumatic disorders of both mitral and tricuspid valves: Secondary | ICD-10-CM | POA: Diagnosis not present

## 2021-04-21 DIAGNOSIS — I4892 Unspecified atrial flutter: Secondary | ICD-10-CM | POA: Diagnosis not present

## 2021-04-21 DIAGNOSIS — Z79899 Other long term (current) drug therapy: Secondary | ICD-10-CM | POA: Diagnosis not present

## 2021-04-21 DIAGNOSIS — Z7901 Long term (current) use of anticoagulants: Secondary | ICD-10-CM | POA: Diagnosis not present

## 2021-04-21 DIAGNOSIS — I4891 Unspecified atrial fibrillation: Secondary | ICD-10-CM | POA: Diagnosis not present

## 2021-04-21 DIAGNOSIS — I480 Paroxysmal atrial fibrillation: Secondary | ICD-10-CM | POA: Diagnosis not present

## 2021-04-23 DIAGNOSIS — I480 Paroxysmal atrial fibrillation: Secondary | ICD-10-CM | POA: Diagnosis not present

## 2021-05-07 DIAGNOSIS — Z9221 Personal history of antineoplastic chemotherapy: Secondary | ICD-10-CM | POA: Diagnosis not present

## 2021-05-07 DIAGNOSIS — Z79899 Other long term (current) drug therapy: Secondary | ICD-10-CM | POA: Diagnosis not present

## 2021-05-07 DIAGNOSIS — I2510 Atherosclerotic heart disease of native coronary artery without angina pectoris: Secondary | ICD-10-CM | POA: Diagnosis not present

## 2021-05-07 DIAGNOSIS — F17210 Nicotine dependence, cigarettes, uncomplicated: Secondary | ICD-10-CM | POA: Diagnosis not present

## 2021-05-07 DIAGNOSIS — I480 Paroxysmal atrial fibrillation: Secondary | ICD-10-CM | POA: Diagnosis not present

## 2021-05-07 DIAGNOSIS — C799 Secondary malignant neoplasm of unspecified site: Secondary | ICD-10-CM | POA: Diagnosis not present

## 2021-05-07 DIAGNOSIS — N399 Disorder of urinary system, unspecified: Secondary | ICD-10-CM | POA: Diagnosis not present

## 2021-05-07 DIAGNOSIS — Z923 Personal history of irradiation: Secondary | ICD-10-CM | POA: Diagnosis not present

## 2021-05-07 DIAGNOSIS — I4891 Unspecified atrial fibrillation: Secondary | ICD-10-CM | POA: Diagnosis not present

## 2021-05-07 DIAGNOSIS — C7951 Secondary malignant neoplasm of bone: Secondary | ICD-10-CM | POA: Diagnosis not present

## 2021-05-07 DIAGNOSIS — I5031 Acute diastolic (congestive) heart failure: Secondary | ICD-10-CM | POA: Diagnosis not present

## 2021-05-07 DIAGNOSIS — I5032 Chronic diastolic (congestive) heart failure: Secondary | ICD-10-CM | POA: Diagnosis not present

## 2021-05-07 DIAGNOSIS — C3431 Malignant neoplasm of lower lobe, right bronchus or lung: Secondary | ICD-10-CM | POA: Diagnosis not present

## 2021-05-07 DIAGNOSIS — C3491 Malignant neoplasm of unspecified part of right bronchus or lung: Secondary | ICD-10-CM | POA: Diagnosis not present

## 2021-05-07 DIAGNOSIS — Z978 Presence of other specified devices: Secondary | ICD-10-CM | POA: Diagnosis not present

## 2021-05-07 DIAGNOSIS — E871 Hypo-osmolality and hyponatremia: Secondary | ICD-10-CM | POA: Diagnosis not present

## 2021-05-19 ENCOUNTER — Ambulatory Visit: Payer: Medicare Other | Admitting: Internal Medicine

## 2021-05-26 ENCOUNTER — Other Ambulatory Visit: Payer: Self-pay | Admitting: *Deleted

## 2021-05-30 ENCOUNTER — Ambulatory Visit (INDEPENDENT_AMBULATORY_CARE_PROVIDER_SITE_OTHER): Payer: Medicare Other | Admitting: *Deleted

## 2021-05-30 ENCOUNTER — Other Ambulatory Visit: Payer: Self-pay

## 2021-05-30 ENCOUNTER — Telehealth: Payer: Medicare Other

## 2021-05-30 DIAGNOSIS — Z122 Encounter for screening for malignant neoplasm of respiratory organs: Secondary | ICD-10-CM

## 2021-05-30 DIAGNOSIS — Z780 Asymptomatic menopausal state: Secondary | ICD-10-CM

## 2021-05-30 DIAGNOSIS — Z1211 Encounter for screening for malignant neoplasm of colon: Secondary | ICD-10-CM

## 2021-05-30 DIAGNOSIS — Z0000 Encounter for general adult medical examination without abnormal findings: Secondary | ICD-10-CM

## 2021-05-30 NOTE — Patient Instructions (Signed)
Cynthia Kelley , Thank you for taking time to come for your Medicare Wellness Visit. I appreciate your ongoing commitment to your health goals. Please review the following plan we discussed and let me know if I can assist you in the future.   Screening recommendations/referrals: Colonoscopy: Referral placed Mammogram: Ordered  Bone Density: Ordered Recommended yearly ophthalmology/optometry visit for glaucoma screening and checkup Recommended yearly dental visit for hygiene and checkup  Vaccinations: Influenza vaccine: Due now Pneumococcal vaccine: Due now Tdap vaccine: Due 12-15-26 Shingles vaccine: Due now    Advanced directives: Copy requested   Next appointment: 1 year   Preventive Care 33 Years and Older, Female Preventive care refers to lifestyle choices and visits with your health care provider that can promote health and wellness. What does preventive care include? A yearly physical exam. This is also called an annual well check. Dental exams once or twice a year. Routine eye exams. Ask your health care provider how often you should have your eyes checked. Personal lifestyle choices, including: Daily care of your teeth and gums. Regular physical activity. Eating a healthy diet. Avoiding tobacco and drug use. Limiting alcohol use. Practicing safe sex. Taking low-dose aspirin every day. Taking vitamin and mineral supplements as recommended by your health care provider. What happens during an annual well check? The services and screenings done by your health care provider during your annual well check will depend on your age, overall health, lifestyle risk factors, and family history of disease. Counseling  Your health care provider may ask you questions about your: Alcohol use. Tobacco use. Drug use. Emotional well-being. Home and relationship well-being. Sexual activity. Eating habits. History of falls. Memory and ability to understand (cognition). Work and work  Statistician. Reproductive health. Screening  You may have the following tests or measurements: Height, weight, and BMI. Blood pressure. Lipid and cholesterol levels. These may be checked every 5 years, or more frequently if you are over 10 years old. Skin check. Lung cancer screening. You may have this screening every year starting at age 60 if you have a 30-pack-year history of smoking and currently smoke or have quit within the past 15 years. Fecal occult blood test (FOBT) of the stool. You may have this test every year starting at age 52. Flexible sigmoidoscopy or colonoscopy. You may have a sigmoidoscopy every 5 years or a colonoscopy every 10 years starting at age 67. Hepatitis C blood test. Hepatitis B blood test. Sexually transmitted disease (STD) testing. Diabetes screening. This is done by checking your blood sugar (glucose) after you have not eaten for a while (fasting). You may have this done every 1-3 years. Bone density scan. This is done to screen for osteoporosis. You may have this done starting at age 20. Mammogram. This may be done every 1-2 years. Talk to your health care provider about how often you should have regular mammograms. Talk with your health care provider about your test results, treatment options, and if necessary, the need for more tests. Vaccines  Your health care provider may recommend certain vaccines, such as: Influenza vaccine. This is recommended every year. Tetanus, diphtheria, and acellular pertussis (Tdap, Td) vaccine. You may need a Td booster every 10 years. Zoster vaccine. You may need this after age 31. Pneumococcal 13-valent conjugate (PCV13) vaccine. One dose is recommended after age 60. Pneumococcal polysaccharide (PPSV23) vaccine. One dose is recommended after age 32. Talk to your health care provider about which screenings and vaccines you need and how often you need  them. This information is not intended to replace advice given to you by  your health care provider. Make sure you discuss any questions you have with your health care provider. Document Released: 10/10/2015 Document Revised: 06/02/2016 Document Reviewed: 07/15/2015 Elsevier Interactive Patient Education  2017 [place] Prevention in the Home Falls can cause injuries. They can happen to people of all ages. There are many things you can do to make your home safe and to help prevent falls. What can I do on the outside of my home? Regularly fix the edges of walkways and driveways and fix any cracks. Remove anything that might make you trip as you walk through a door, such as a raised step or threshold. Trim any bushes or trees on the path to your home. Use bright outdoor lighting. Clear any walking paths of anything that might make someone trip, such as rocks or tools. Regularly check to see if handrails are loose or broken. Make sure that both sides of any steps have handrails. Any raised decks and porches should have guardrails on the edges. Have any leaves, snow, or ice cleared regularly. Use sand or salt on walking paths during winter. Clean up any spills in your garage right away. This includes oil or grease spills. What can I do in the bathroom? Use night lights. Install grab bars by the toilet and in the tub and shower. Do not use towel bars as grab bars. Use non-skid mats or decals in the tub or shower. If you need to sit down in the shower, use a plastic, non-slip stool. Keep the floor dry. Clean up any water that spills on the floor as soon as it happens. Remove soap buildup in the tub or shower regularly. Attach bath mats securely with double-sided non-slip rug tape. Do not have throw rugs and other things on the floor that can make you trip. What can I do in the bedroom? Use night lights. Make sure that you have a light by your bed that is easy to reach. Do not use any sheets or blankets that are too big for your bed. They should not hang  down onto the floor. Have a firm chair that has side arms. You can use this for support while you get dressed. Do not have throw rugs and other things on the floor that can make you trip. What can I do in the kitchen? Clean up any spills right away. Avoid walking on wet floors. Keep items that you use a lot in easy-to-reach places. If you need to reach something above you, use a strong step stool that has a grab bar. Keep electrical cords out of the way. Do not use floor polish or wax that makes floors slippery. If you must use wax, use non-skid floor wax. Do not have throw rugs and other things on the floor that can make you trip. What can I do with my stairs? Do not leave any items on the stairs. Make sure that there are handrails on both sides of the stairs and use them. Fix handrails that are broken or loose. Make sure that handrails are as long as the stairways. Check any carpeting to make sure that it is firmly attached to the stairs. Fix any carpet that is loose or worn. Avoid having throw rugs at the top or bottom of the stairs. If you do have throw rugs, attach them to the floor with carpet tape. Make sure that you have a light switch at  the top of the stairs and the bottom of the stairs. If you do not have them, ask someone to add them for you. What else can I do to help prevent falls? Wear shoes that: Do not have high heels. Have rubber bottoms. Are comfortable and fit you well. Are closed at the toe. Do not wear sandals. If you use a stepladder: Make sure that it is fully opened. Do not climb a closed stepladder. Make sure that both sides of the stepladder are locked into place. Ask someone to hold it for you, if possible. Clearly mark and make sure that you can see: Any grab bars or handrails. First and last steps. Where the edge of each step is. Use tools that help you move around (mobility aids) if they are needed. These  include: Canes. Walkers. Scooters. Crutches. Turn on the lights when you go into a dark area. Replace any light bulbs as soon as they burn out. Set up your furniture so you have a clear path. Avoid moving your furniture around. If any of your floors are uneven, fix them. If there are any pets around you, be aware of where they are. Review your medicines with your doctor. Some medicines can make you feel dizzy. This can increase your chance of falling. Ask your doctor what other things that you can do to help prevent falls. This information is not intended to replace advice given to you by your health care provider. Make sure you discuss any questions you have with your health care provider. Document Released: 07/10/2009 Document Revised: 02/19/2016 Document Reviewed: 10/18/2014 Elsevier Interactive Patient Education  2017 [employer].

## 2021-05-30 NOTE — Progress Notes (Signed)
Subjective:   Cynthia Kelley is a 65 y.o. female who presents for Medicare Annual (Subsequent) preventive examination.  I connected with  Cynthia Kelley on 05/30/21 by an audio enabled telemedicine application and verified that I am speaking with the correct person using two identifiers.   I discussed the limitations of evaluation and management by telemedicine. The patient expressed understanding and agreed to proceed.   Review of Systems           Objective:    There were no vitals filed for this visit. There is no height or weight on file to calculate BMI.  Advanced Directives 03/03/2021 03/02/2021 01/12/2021 10/10/2017 12/02/2016 05/25/2016  Does Patient Have a Medical Advance Directive? Yes No No Yes No Yes  Type of Advance Directive Living will - - Cynthia Kelley;Living will - -  Does patient want to make changes to medical advance directive? No - Patient declined - - No - Patient declined - -  Copy of [place] in Chart? - - - No - copy requested - -  Would patient like information on creating a medical advance directive? No - Patient declined No - Patient declined No - Patient declined No - Patient declined No - Patient declined -    Current Medications (verified) Outpatient Encounter Medications as of 05/30/2021  Medication Sig   amiodarone (PACERONE) 200 MG tablet Take 200 mg by mouth daily.   apixaban (ELIQUIS) 5 MG TABS tablet Take 5 mg by mouth 2 (two) times daily.   furosemide (LASIX) 20 MG tablet Take 1 tablet (20 mg total) by mouth daily.   LORazepam (ATIVAN) 1 MG tablet Take 1 mg by mouth at bedtime.   memantine (NAMENDA) 10 MG tablet Take 10 mg by mouth every morning.   metoprolol (TOPROL-XL) 200 MG 24 hr tablet Take 200 mg by mouth daily.   Multiple Vitamins-Minerals (MULTIVITAMIN WITH MINERALS) tablet Take 1 tablet by mouth daily.   potassium chloride SA (K-DUR,KLOR-CON) 20 MEQ tablet Take 20 mEq by mouth daily.   sodium chloride 1 g tablet  Take 1 tablet (1 g total) by mouth 2 (two) times daily with a meal.   No facility-administered encounter medications on file as of 05/30/2021.    Allergies (verified) Patient has no known allergies.   History: Past Medical History:  Diagnosis Date   Acute diastolic congestive heart failure, NYHA class 2 ([place]) 11/11/2020   Anemia 11/22/2017   Anxiety 11/19/2020   Arthritis    Asymptomatic PVD (peripheral vascular disease) ([place]) 12/14/2016   Suspected.  Hands hyperemic with decreased cap refill. ? History vasospasm   COPD with acute exacerbation ([place]) 01/14/2021   FRACTURE, TIBIAL PLATEAU 05/11/2010   Qualifier: Diagnosis of  By: Aline Brochure MD, [place]     Lung cancer, primary, with metastasis from lung to other site [facility]) 01/16/2018   Nuclear sclerotic cataract of both eyes 06/08/2018   Osteopenia 05/15/2013   Osteoporosis    osteopenia   Tobacco abuse 12/14/2016   Past Surgical History:  Procedure Laterality Date   FRACTURE SURGERY     torn ACL   KNEE ARTHROSCOPY     Family History  Problem Relation Age of Onset   Cancer Father        died in 78's everywhere   Heart disease Mother    Cancer Mother        lung   Stroke Mother    Asthma Daughter    Social History   Socioeconomic History  Marital status: Married    Spouse name: Cynthia Kelley   Number of children: 1   Years of education: 15   Highest education level: Not on file  Occupational History   Occupation: Air traffic controller    Comment: retired  Tobacco Use   Smoking status: Every Day    Packs/day: 0.50    Years: 20.00    Pack years: 10.00    Types: Cigarettes    Start date: 09/28/1963   Smokeless tobacco: Never  Vaping Use   Vaping Use: Never used  Substance and Sexual Activity   Alcohol use: Yes    Alcohol/week: 20.0 standard drinks    Types: 20 Cans of beer per week    Comment: 3-4 beers a day   Drug use: No   Sexual activity: Yes    Birth control/protection: Kelley-menopausal  Other Topics Concern   Not on file   Social History Narrative   Retired Air traffic controller   Lives with husband Cynthia Kelley   Daughter Marcelino Scot is in [place] to walk   Social Determinants of Radio broadcast assistant Strain: Not on [employer] Insecurity: Not on file  Transportation Needs: Not on file  Physical Activity: Not on file  Stress: Not on file  Social Connections: Not on file    Tobacco Counseling Ready to quit: Not Answered Counseling given: Not Answered   Clinical Intake:                 Diabetic?No         Activities of Daily Living In your present state of health, do you have any difficulty performing the following activities: 03/03/2021 01/12/2021  Hearing? N N  Vision? N N  Difficulty concentrating or making decisions? N N  Walking or climbing stairs? N N  Dressing or bathing? N N  Doing errands, shopping? N N  Some recent data might be hidden    Patient Care Team: Lindell Spar, MD as PCP - General (Internal Medicine)  Indicate any recent Medical Services you may have received from other than [facility] providers in the past year (date may be approximate).     Assessment:   This is a routine wellness examination for Cynthia Kelley.  Hearing/Vision screen No results found.  Dietary issues and exercise activities discussed:     Goals Addressed   None   Depression Screen PHQ 2/9 Scores 11/19/2020 07/19/2018 07/29/2017 06/15/2017 12/14/2016  PHQ - 2 Score 0 3 0 0 0  PHQ- 9 Score - 5 - - -    Fall Risk Fall Risk  11/19/2020 12/14/2016  Falls in the past year? 0 No  Number falls in past yr: 0 -  Injury with Fall? 0 -  Risk for fall due to : No Fall Risks -  Follow up Falls evaluation completed -    FALL RISK PREVENTION PERTAINING TO THE HOME:  Any stairs in or around the home? No  If so, are there any without handrails? No  Home free of loose throw rugs in walkways, pet beds, electrical cords, etc? Yes  Adequate lighting in your home to reduce risk of falls? Yes   ASSISTIVE DEVICES  UTILIZED TO PREVENT FALLS:  Life alert? No  Use of a cane, walker or w/c? No  Grab bars in the bathroom? Yes  Shower chair or bench in shower? No  Elevated toilet seat or a handicapped toilet? No   TIMED UP AND GO:  Was the test performed? No .  Length of time to ambulate 10 feet: NA sec.     Cognitive Function:        Immunizations Immunization History  Administered Date(s) Administered   Influenza, High Dose Seasonal PF 07/10/2020   Influenza, Quadrivalent, Recombinant, Inj, Pf 10/20/2017   Influenza,inj,Quad PF,6-35 Mos 07/06/2018, 06/28/2019   PFIZER Comirnaty(Gray Top)Covid-19 Tri-Sucrose Vaccine 11/20/2019, 12/11/2019, 05/25/2020   PFIZER(Purple Top)SARS-COV-2 Vaccination 11/20/2019, 12/11/2019, 05/25/2020   TDAP status: Up to date   Flu Vaccine status: Due, Education has been provided regarding the importance of this vaccine. Advised may receive this vaccine at local pharmacy or Health Dept. Aware to provide a copy of the vaccination record if obtained from local pharmacy or Health Dept. Verbalized acceptance and understanding.  Pneumococcal vaccine status: Due, Education has been provided regarding the importance of this vaccine. Advised may receive this vaccine at local pharmacy or Health Dept. Aware to provide a copy of the vaccination record if obtained from local pharmacy or Health Dept. Verbalized acceptance and understanding.  Covid-19 vaccine status: Completed vaccines  Qualifies for Shingles Vaccine? Yes   Zostavax completed No   Shingrix Completed?: No.    Education has been provided regarding the importance of this vaccine. Patient has been advised to call insurance company to determine out of pocket expense if they have not yet received this vaccine. Advised may also receive vaccine at local pharmacy or Health Dept. Verbalized acceptance and understanding.  Screening Tests Health Maintenance  Topic Date Due   Hepatitis C Screening  Never done   Zoster  Vaccines- Shingrix (1 of 2) Never done   COLONOSCOPY (Pts 45-60yrs Insurance coverage will need to be confirmed)  Never done   MAMMOGRAM  03/25/2018   PNA vac Low Risk Adult (1 of 2 - PCV13) Never done   COVID-19 Vaccine (4 - Booster for Pfizer series) 08/25/2020   INFLUENZA VACCINE  04/27/2021   TETANUS/TDAP  12/15/2026   DEXA SCAN  Completed   HPV VACCINES  Aged Out    Health Maintenance  Health Maintenance Due  Topic Date Due   Hepatitis C Screening  Never done   Zoster Vaccines- Shingrix (1 of 2) Never done   COLONOSCOPY (Pts 45-49yrs Insurance coverage will need to be confirmed)  Never done   MAMMOGRAM  03/25/2018   PNA vac Low Risk Adult (1 of 2 - PCV13) Never done   COVID-19 Vaccine (4 - Booster for Pfizer series) 08/25/2020   INFLUENZA VACCINE  04/27/2021    Colorectal cancer screening: Referral to GI placed [facility]. Pt aware the office will call re: appt.  Mammogram status: Ordered 11-19-20. Pt provided with contact info and advised to call to schedule appt.   Bone Density status: Ordered 05-30-21. Pt provided with contact info and advised to call to schedule appt.  Lung Cancer Screening: (Low Dose CT Chest recommended if Age 26-80 years, 30 pack-year currently smoking OR have quit w/in 15years.) does qualify.   Lung Cancer Screening Referral: yes  Additional Screening:  Hepatitis C Screening: does qualify;  Vision Screening: Recommended annual ophthalmology exams for early detection of glaucoma and other disorders of the eye. Is the patient up to date with their annual eye exam?  Yes  Who is the provider or what is the name of the office in which the patient attends annual eye exams? [facility] If pt is not established with a provider, would they like to be referred to a provider to establish care? No .   Dental  Screening: Recommended annual dental exams for proper oral hygiene  Community Resource Referral / Chronic Care Management: CRR  required this visit?  No   CCM required this visit?  No      Plan:     I have personally reviewed and noted the following in the patient's chart:   Medical and social history Use of alcohol, tobacco or illicit drugs  Current medications and supplements including opioid prescriptions.  Functional ability and status Nutritional status Physical activity Advanced directives List of other physicians Hospitalizations, surgeries, and ER visits in previous 12 months Vitals Screenings to include cognitive, depression, and falls Referrals and appointments  In addition, I have reviewed and discussed with patient certain preventive protocols, quality metrics, and best practice recommendations. A written personalized care plan for preventive services as well as general preventive health recommendations were provided to patient.     Shelda Altes, CMA   05/30/2021   Nurse Notes: This was a telehealth visit. The patient was at home. The provider was at home and was Ihor Dow, MD.

## 2021-06-03 ENCOUNTER — Encounter: Payer: Self-pay | Admitting: Internal Medicine

## 2021-06-10 ENCOUNTER — Inpatient Hospital Stay (HOSPITAL_COMMUNITY): Admission: RE | Admit: 2021-06-10 | Payer: Medicare Other | Source: Ambulatory Visit

## 2021-06-29 ENCOUNTER — Ambulatory Visit (HOSPITAL_COMMUNITY): Admission: RE | Admit: 2021-06-29 | Payer: Medicare Other | Source: Ambulatory Visit

## 2021-07-02 ENCOUNTER — Ambulatory Visit: Payer: Medicare Other | Admitting: Internal Medicine

## 2021-08-05 DIAGNOSIS — C792 Secondary malignant neoplasm of skin: Secondary | ICD-10-CM | POA: Diagnosis not present

## 2021-08-05 DIAGNOSIS — C3491 Malignant neoplasm of unspecified part of right bronchus or lung: Secondary | ICD-10-CM | POA: Diagnosis not present

## 2021-08-05 DIAGNOSIS — C3431 Malignant neoplasm of lower lobe, right bronchus or lung: Secondary | ICD-10-CM | POA: Diagnosis not present

## 2021-08-05 DIAGNOSIS — J432 Centrilobular emphysema: Secondary | ICD-10-CM | POA: Diagnosis not present

## 2021-08-06 DIAGNOSIS — G893 Neoplasm related pain (acute) (chronic): Secondary | ICD-10-CM | POA: Diagnosis not present

## 2021-08-06 DIAGNOSIS — C3491 Malignant neoplasm of unspecified part of right bronchus or lung: Secondary | ICD-10-CM | POA: Diagnosis not present

## 2021-08-06 DIAGNOSIS — E871 Hypo-osmolality and hyponatremia: Secondary | ICD-10-CM | POA: Diagnosis not present

## 2021-08-06 DIAGNOSIS — I4891 Unspecified atrial fibrillation: Secondary | ICD-10-CM | POA: Diagnosis not present

## 2021-08-06 DIAGNOSIS — C7951 Secondary malignant neoplasm of bone: Secondary | ICD-10-CM | POA: Diagnosis not present

## 2021-08-06 DIAGNOSIS — I480 Paroxysmal atrial fibrillation: Secondary | ICD-10-CM | POA: Diagnosis not present

## 2021-08-06 DIAGNOSIS — F17210 Nicotine dependence, cigarettes, uncomplicated: Secondary | ICD-10-CM | POA: Diagnosis not present

## 2021-08-11 DIAGNOSIS — R413 Other amnesia: Secondary | ICD-10-CM | POA: Diagnosis not present

## 2021-08-11 DIAGNOSIS — Z9221 Personal history of antineoplastic chemotherapy: Secondary | ICD-10-CM | POA: Diagnosis not present

## 2021-08-11 DIAGNOSIS — Z79899 Other long term (current) drug therapy: Secondary | ICD-10-CM | POA: Diagnosis not present

## 2021-08-11 DIAGNOSIS — R260 Ataxic gait: Secondary | ICD-10-CM | POA: Diagnosis not present

## 2021-08-11 DIAGNOSIS — F1090 Alcohol use, unspecified, uncomplicated: Secondary | ICD-10-CM | POA: Diagnosis not present

## 2021-08-11 DIAGNOSIS — C3490 Malignant neoplasm of unspecified part of unspecified bronchus or lung: Secondary | ICD-10-CM | POA: Diagnosis not present

## 2021-08-11 DIAGNOSIS — G629 Polyneuropathy, unspecified: Secondary | ICD-10-CM | POA: Diagnosis not present

## 2021-08-11 DIAGNOSIS — R270 Ataxia, unspecified: Secondary | ICD-10-CM | POA: Diagnosis not present

## 2021-08-11 DIAGNOSIS — Z720 Tobacco use: Secondary | ICD-10-CM | POA: Diagnosis not present

## 2021-10-12 ENCOUNTER — Encounter: Payer: Self-pay | Admitting: Gastroenterology

## 2021-10-12 ENCOUNTER — Telehealth: Payer: Self-pay | Admitting: *Deleted

## 2021-10-12 NOTE — Telephone Encounter (Signed)
She called 3 times said someone had called her and she accidentally cut the message off.  She said she had sent paper work back.  It appears that both offices got referrals and both sent letters --I am assuming she sent questionaire back here.   Please give her a call back at [phone]

## 2021-10-12 NOTE — Progress Notes (Deleted)
Referring Provider: Lindell Spar, MD Primary Care Physician:  Lindell Spar, MD Primary Gastroenterologist:  Dr. Abbey Chatters  No chief complaint on file.   HPI:   Cynthia Kelley is a 66 y.o. female presenting today at the request of Lindell Spar, MD for colon cancer screening.  Past Medical History:  Diagnosis Date   Acute diastolic congestive heart failure, NYHA class 2 ([place]) 11/11/2020   Anemia 11/22/2017   Anxiety 11/19/2020   Arthritis    Asymptomatic PVD (peripheral vascular disease) ([place]) 12/14/2016   Suspected.  Hands hyperemic with decreased cap refill. ? History vasospasm   COPD with acute exacerbation ([place]) 01/14/2021   FRACTURE, TIBIAL PLATEAU 05/11/2010   Qualifier: Diagnosis of  By: Aline Brochure MD, [place]     Lung cancer, primary, with metastasis from lung to other site [facility]) 01/16/2018   Nuclear sclerotic cataract of both eyes 06/08/2018   Osteopenia 05/15/2013   Osteoporosis    osteopenia   Tobacco abuse 12/14/2016    Past Surgical History:  Procedure Laterality Date   FRACTURE SURGERY     torn ACL   KNEE ARTHROSCOPY      Current Outpatient Medications  Medication Sig Dispense Refill   amiodarone (PACERONE) 200 MG tablet Take 200 mg by mouth daily.     apixaban (ELIQUIS) 5 MG TABS tablet Take 5 mg by mouth 2 (two) times daily.     furosemide (LASIX) 20 MG tablet Take 1 tablet (20 mg total) by mouth daily. 30 tablet 1   LORazepam (ATIVAN) 1 MG tablet Take 1 mg by mouth at bedtime.     memantine (NAMENDA) 10 MG tablet Take 10 mg by mouth every morning.     metoprolol (TOPROL-XL) 200 MG 24 hr tablet Take 200 mg by mouth daily.     Multiple Vitamins-Minerals (MULTIVITAMIN WITH MINERALS) tablet Take 1 tablet by mouth daily.     potassium chloride SA (K-DUR,KLOR-CON) 20 MEQ tablet Take 20 mEq by mouth daily.     sodium chloride 1 g tablet Take 1 tablet (1 g total) by mouth 2 (two) times daily with a meal. 60 tablet 2   No current facility-administered medications for  this visit.    Allergies as of 10/14/2021   (No Known Allergies)    Family History  Problem Relation Age of Onset   Cancer Father        died in 66's everywhere   Heart disease Mother    Cancer Mother        lung   Stroke Mother    Asthma Daughter     Social History   Socioeconomic History   Marital status: Married    Spouse name: [name]   Number of children: 1   Years of education: 15   Highest education level: Not on file  Occupational History   Occupation: Air traffic controller    Comment: retired  Tobacco Use   Smoking status: Every Day    Packs/day: 0.50    Years: 20.00    Pack years: 10.00    Types: Cigarettes    Start date: 09/28/1963   Smokeless tobacco: Never  Vaping Use   Vaping Use: Never used  Substance and Sexual Activity   Alcohol use: Yes    Alcohol/week: 20.0 standard drinks    Types: 20 Cans of beer per week    Comment: 3-4 beers a day   Drug use: No   Sexual activity: Yes    Birth control/protection: Post-menopausal  Other Topics  Concern   Not on file  Social History Narrative   Retired Air traffic controller   Lives with husband [name]   Daughter Marcelino Scot is in [place] to walk   Social Determinants of Radio broadcast assistant Strain: Low Risk    Difficulty of Paying Living Expenses: Not hard at all  Food Insecurity: No Food Insecurity   Worried About Charity fundraiser in the Last Year: Never true   Arboriculturist in the Last Year: Never true  Transportation Needs: No Transportation Needs   Lack of Transportation (Medical): No   Lack of Transportation (Non-Medical): No  Physical Activity: Insufficiently Active   Days of Exercise per Week: 3 days   Minutes of Exercise per Session: 30 min  Stress: No Stress Concern Present   Feeling of Stress : Not at all  Social Connections: Moderately Isolated   Frequency of Communication with Friends and Family: More than three times a week   Frequency of Social Gatherings with Friends and Family: More than  three times a week   Attends Religious Services: Never   Marine scientist or Organizations: No   Attends Music therapist: Never   Marital Status: Married  Human resources officer Violence: Not At Risk   Fear of Current or Ex-Partner: No   Emotionally Abused: No   Physically Abused: No   Sexually Abused: No    Review of Systems: Gen: Denies any fever, chills, fatigue, weight loss, lack of appetite.  CV: Denies chest pain, heart palpitations, peripheral edema, syncope.  Resp: Denies shortness of breath at rest or with exertion. Denies wheezing or cough.  GI: Denies dysphagia or odynophagia. Denies jaundice, hematemesis, fecal incontinence. GU : Denies urinary burning, urinary frequency, urinary hesitancy MS: Denies joint pain, muscle weakness, cramps, or limitation of movement.  Derm: Denies rash, itching, dry skin Psych: Denies depression, anxiety, memory loss, and confusion Heme: Denies bruising, bleeding, and enlarged lymph nodes.  Physical Exam: There were no vitals taken for this visit. General:   Alert and oriented. Pleasant and cooperative. Well-nourished and well-developed.  Head:  Normocephalic and atraumatic. Eyes:  Without icterus, sclera clear and conjunctiva pink.  Ears:  Normal auditory acuity. Nose:  No deformity, discharge,  or lesions. Mouth:  No deformity or lesions, oral mucosa pink.  Neck:  Supple, without mass or thyromegaly. Lungs:  Clear to auscultation bilaterally. No wheezes, rales, or rhonchi. No distress.  Heart:  S1, S2 present without murmurs appreciated.  Abdomen:  +BS, soft, non-tender and non-distended. No HSM noted. No guarding or rebound. No masses appreciated.  Rectal:  Deferred  Msk:  Symmetrical without gross deformities. Normal posture. Pulses:  Normal pulses noted. Extremities:  Without clubbing or edema. Neurologic:  Alert and  oriented x4;  grossly normal neurologically. Skin:  Intact without significant lesions or  rashes. Cervical Nodes:  No significant cervical adenopathy. Psych:  Alert and cooperative. Normal mood and affect.

## 2021-10-13 NOTE — Telephone Encounter (Signed)
Patient sch'd for Nurse Triage visit at [facility] 10/14/21

## 2021-10-14 ENCOUNTER — Ambulatory Visit: Payer: Medicare Other | Admitting: Internal Medicine

## 2021-10-14 ENCOUNTER — Ambulatory Visit: Payer: Medicare Other | Admitting: Gastroenterology

## 2021-10-14 ENCOUNTER — Encounter: Payer: Self-pay | Admitting: Internal Medicine

## 2021-10-27 DIAGNOSIS — R001 Bradycardia, unspecified: Secondary | ICD-10-CM | POA: Diagnosis not present

## 2021-10-27 DIAGNOSIS — I480 Paroxysmal atrial fibrillation: Secondary | ICD-10-CM | POA: Diagnosis not present

## 2021-10-27 DIAGNOSIS — I5032 Chronic diastolic (congestive) heart failure: Secondary | ICD-10-CM | POA: Diagnosis not present

## 2021-11-04 DIAGNOSIS — J432 Centrilobular emphysema: Secondary | ICD-10-CM | POA: Diagnosis not present

## 2021-11-04 DIAGNOSIS — C7951 Secondary malignant neoplasm of bone: Secondary | ICD-10-CM | POA: Diagnosis not present

## 2021-11-04 DIAGNOSIS — C3431 Malignant neoplasm of lower lobe, right bronchus or lung: Secondary | ICD-10-CM | POA: Diagnosis not present

## 2021-11-04 DIAGNOSIS — C3491 Malignant neoplasm of unspecified part of right bronchus or lung: Secondary | ICD-10-CM | POA: Diagnosis not present

## 2021-11-05 DIAGNOSIS — E871 Hypo-osmolality and hyponatremia: Secondary | ICD-10-CM | POA: Diagnosis not present

## 2021-11-05 DIAGNOSIS — C7951 Secondary malignant neoplasm of bone: Secondary | ICD-10-CM | POA: Diagnosis not present

## 2021-11-05 DIAGNOSIS — G893 Neoplasm related pain (acute) (chronic): Secondary | ICD-10-CM | POA: Diagnosis not present

## 2021-11-05 DIAGNOSIS — Z9221 Personal history of antineoplastic chemotherapy: Secondary | ICD-10-CM | POA: Diagnosis not present

## 2021-11-05 DIAGNOSIS — F17210 Nicotine dependence, cigarettes, uncomplicated: Secondary | ICD-10-CM | POA: Diagnosis not present

## 2021-11-05 DIAGNOSIS — I4891 Unspecified atrial fibrillation: Secondary | ICD-10-CM | POA: Diagnosis not present

## 2021-11-05 DIAGNOSIS — Z95828 Presence of other vascular implants and grafts: Secondary | ICD-10-CM | POA: Diagnosis not present

## 2021-11-05 DIAGNOSIS — Z923 Personal history of irradiation: Secondary | ICD-10-CM | POA: Diagnosis not present

## 2021-11-05 DIAGNOSIS — C3491 Malignant neoplasm of unspecified part of right bronchus or lung: Secondary | ICD-10-CM | POA: Diagnosis not present

## 2022-02-02 DIAGNOSIS — I4891 Unspecified atrial fibrillation: Secondary | ICD-10-CM | POA: Diagnosis not present

## 2022-02-02 DIAGNOSIS — Z79899 Other long term (current) drug therapy: Secondary | ICD-10-CM | POA: Diagnosis not present

## 2022-02-02 DIAGNOSIS — Z9221 Personal history of antineoplastic chemotherapy: Secondary | ICD-10-CM | POA: Diagnosis not present

## 2022-02-02 DIAGNOSIS — G454 Transient global amnesia: Secondary | ICD-10-CM | POA: Diagnosis not present

## 2022-02-02 DIAGNOSIS — F17210 Nicotine dependence, cigarettes, uncomplicated: Secondary | ICD-10-CM | POA: Diagnosis not present

## 2022-02-02 DIAGNOSIS — C3491 Malignant neoplasm of unspecified part of right bronchus or lung: Secondary | ICD-10-CM | POA: Diagnosis not present

## 2022-02-02 DIAGNOSIS — C7951 Secondary malignant neoplasm of bone: Secondary | ICD-10-CM | POA: Diagnosis not present

## 2022-02-02 DIAGNOSIS — C3431 Malignant neoplasm of lower lobe, right bronchus or lung: Secondary | ICD-10-CM | POA: Diagnosis not present

## 2022-02-02 DIAGNOSIS — E871 Hypo-osmolality and hyponatremia: Secondary | ICD-10-CM | POA: Diagnosis not present

## 2022-02-02 DIAGNOSIS — Z7901 Long term (current) use of anticoagulants: Secondary | ICD-10-CM | POA: Diagnosis not present

## 2022-02-02 DIAGNOSIS — Z923 Personal history of irradiation: Secondary | ICD-10-CM | POA: Diagnosis not present

## 2022-02-02 DIAGNOSIS — R413 Other amnesia: Secondary | ICD-10-CM | POA: Diagnosis not present

## 2022-02-27 ENCOUNTER — Other Ambulatory Visit: Payer: Self-pay

## 2022-02-27 ENCOUNTER — Inpatient Hospital Stay (HOSPITAL_COMMUNITY)
Admission: EM | Admit: 2022-02-27 | Discharge: 2022-03-04 | DRG: 563 | Disposition: A | Discharge status: Home / Self Care | Payer: Medicare Other | Attending: Internal Medicine | Admitting: Internal Medicine

## 2022-02-27 ENCOUNTER — Encounter (HOSPITAL_COMMUNITY): Payer: Self-pay

## 2022-02-27 ENCOUNTER — Emergency Department (HOSPITAL_COMMUNITY): Payer: Medicare Other

## 2022-02-27 ENCOUNTER — Inpatient Hospital Stay (HOSPITAL_COMMUNITY): Payer: Medicare Other

## 2022-02-27 DIAGNOSIS — I480 Paroxysmal atrial fibrillation: Secondary | ICD-10-CM

## 2022-02-27 DIAGNOSIS — R7401 Elevation of levels of liver transaminase levels: Secondary | ICD-10-CM

## 2022-02-27 DIAGNOSIS — Z8249 Family history of ischemic heart disease and other diseases of the circulatory system: Secondary | ICD-10-CM

## 2022-02-27 DIAGNOSIS — S42211A Unspecified displaced fracture of surgical neck of right humerus, initial encounter for closed fracture: Secondary | ICD-10-CM | POA: Diagnosis not present

## 2022-02-27 DIAGNOSIS — Y92009 Unspecified place in unspecified non-institutional (private) residence as the place of occurrence of the external cause: Secondary | ICD-10-CM

## 2022-02-27 DIAGNOSIS — I110 Hypertensive heart disease with heart failure: Secondary | ICD-10-CM | POA: Diagnosis not present

## 2022-02-27 DIAGNOSIS — Z66 Do not resuscitate: Secondary | ICD-10-CM | POA: Diagnosis not present

## 2022-02-27 DIAGNOSIS — Z515 Encounter for palliative care: Secondary | ICD-10-CM | POA: Diagnosis not present

## 2022-02-27 DIAGNOSIS — R609 Edema, unspecified: Secondary | ICD-10-CM | POA: Diagnosis not present

## 2022-02-27 DIAGNOSIS — G8929 Other chronic pain: Secondary | ICD-10-CM | POA: Diagnosis not present

## 2022-02-27 DIAGNOSIS — J449 Chronic obstructive pulmonary disease, unspecified: Secondary | ICD-10-CM | POA: Diagnosis not present

## 2022-02-27 DIAGNOSIS — F0394 Unspecified dementia, unspecified severity, with anxiety: Secondary | ICD-10-CM | POA: Diagnosis present

## 2022-02-27 DIAGNOSIS — Z801 Family history of malignant neoplasm of trachea, bronchus and lung: Secondary | ICD-10-CM

## 2022-02-27 DIAGNOSIS — Z7189 Other specified counseling: Secondary | ICD-10-CM | POA: Diagnosis not present

## 2022-02-27 DIAGNOSIS — C787 Secondary malignant neoplasm of liver and intrahepatic bile duct: Secondary | ICD-10-CM | POA: Diagnosis not present

## 2022-02-27 DIAGNOSIS — S42291A Other displaced fracture of upper end of right humerus, initial encounter for closed fracture: Principal | ICD-10-CM | POA: Diagnosis present

## 2022-02-27 DIAGNOSIS — M810 Age-related osteoporosis without current pathological fracture: Secondary | ICD-10-CM | POA: Diagnosis present

## 2022-02-27 DIAGNOSIS — S42251A Displaced fracture of greater tuberosity of right humerus, initial encounter for closed fracture: Secondary | ICD-10-CM | POA: Diagnosis present

## 2022-02-27 DIAGNOSIS — F1010 Alcohol abuse, uncomplicated: Secondary | ICD-10-CM | POA: Diagnosis present

## 2022-02-27 DIAGNOSIS — R748 Abnormal levels of other serum enzymes: Secondary | ICD-10-CM

## 2022-02-27 DIAGNOSIS — E871 Hypo-osmolality and hyponatremia: Secondary | ICD-10-CM | POA: Diagnosis not present

## 2022-02-27 DIAGNOSIS — I4819 Other persistent atrial fibrillation: Secondary | ICD-10-CM | POA: Diagnosis present

## 2022-02-27 DIAGNOSIS — F03918 Unspecified dementia, unspecified severity, with other behavioral disturbance: Secondary | ICD-10-CM

## 2022-02-27 DIAGNOSIS — D649 Anemia, unspecified: Secondary | ICD-10-CM | POA: Diagnosis present

## 2022-02-27 DIAGNOSIS — R0781 Pleurodynia: Secondary | ICD-10-CM | POA: Diagnosis not present

## 2022-02-27 DIAGNOSIS — S42213A Unspecified displaced fracture of surgical neck of unspecified humerus, initial encounter for closed fracture: Secondary | ICD-10-CM | POA: Diagnosis present

## 2022-02-27 DIAGNOSIS — F17210 Nicotine dependence, cigarettes, uncomplicated: Secondary | ICD-10-CM | POA: Diagnosis present

## 2022-02-27 DIAGNOSIS — B179 Acute viral hepatitis, unspecified: Secondary | ICD-10-CM | POA: Diagnosis present

## 2022-02-27 DIAGNOSIS — R739 Hyperglycemia, unspecified: Secondary | ICD-10-CM | POA: Diagnosis present

## 2022-02-27 DIAGNOSIS — Z823 Family history of stroke: Secondary | ICD-10-CM

## 2022-02-27 DIAGNOSIS — Z85118 Personal history of other malignant neoplasm of bronchus and lung: Secondary | ICD-10-CM

## 2022-02-27 DIAGNOSIS — F419 Anxiety disorder, unspecified: Secondary | ICD-10-CM | POA: Diagnosis not present

## 2022-02-27 DIAGNOSIS — K7010 Alcoholic hepatitis without ascites: Secondary | ICD-10-CM | POA: Diagnosis not present

## 2022-02-27 DIAGNOSIS — R531 Weakness: Secondary | ICD-10-CM | POA: Diagnosis not present

## 2022-02-27 DIAGNOSIS — C3490 Malignant neoplasm of unspecified part of unspecified bronchus or lung: Secondary | ICD-10-CM | POA: Diagnosis not present

## 2022-02-27 DIAGNOSIS — W19XXXA Unspecified fall, initial encounter: Principal | ICD-10-CM

## 2022-02-27 DIAGNOSIS — Z923 Personal history of irradiation: Secondary | ICD-10-CM

## 2022-02-27 DIAGNOSIS — I739 Peripheral vascular disease, unspecified: Secondary | ICD-10-CM | POA: Diagnosis present

## 2022-02-27 DIAGNOSIS — R945 Abnormal results of liver function studies: Secondary | ICD-10-CM | POA: Diagnosis not present

## 2022-02-27 DIAGNOSIS — Z79899 Other long term (current) drug therapy: Secondary | ICD-10-CM

## 2022-02-27 DIAGNOSIS — S42211G Unspecified displaced fracture of surgical neck of right humerus, subsequent encounter for fracture with delayed healing: Secondary | ICD-10-CM | POA: Diagnosis not present

## 2022-02-27 DIAGNOSIS — R52 Pain, unspecified: Secondary | ICD-10-CM | POA: Diagnosis not present

## 2022-02-27 DIAGNOSIS — Z825 Family history of asthma and other chronic lower respiratory diseases: Secondary | ICD-10-CM

## 2022-02-27 DIAGNOSIS — W010XXA Fall on same level from slipping, tripping and stumbling without subsequent striking against object, initial encounter: Secondary | ICD-10-CM | POA: Diagnosis present

## 2022-02-27 DIAGNOSIS — F0390 Unspecified dementia without behavioral disturbance: Secondary | ICD-10-CM | POA: Diagnosis not present

## 2022-02-27 DIAGNOSIS — K760 Fatty (change of) liver, not elsewhere classified: Secondary | ICD-10-CM | POA: Diagnosis present

## 2022-02-27 DIAGNOSIS — E222 Syndrome of inappropriate secretion of antidiuretic hormone: Secondary | ICD-10-CM | POA: Diagnosis present

## 2022-02-27 DIAGNOSIS — Z743 Need for continuous supervision: Secondary | ICD-10-CM | POA: Diagnosis not present

## 2022-02-27 DIAGNOSIS — E876 Hypokalemia: Secondary | ICD-10-CM | POA: Diagnosis not present

## 2022-02-27 DIAGNOSIS — I5032 Chronic diastolic (congestive) heart failure: Secondary | ICD-10-CM | POA: Diagnosis present

## 2022-02-27 DIAGNOSIS — R296 Repeated falls: Secondary | ICD-10-CM | POA: Diagnosis not present

## 2022-02-27 DIAGNOSIS — Z9221 Personal history of antineoplastic chemotherapy: Secondary | ICD-10-CM

## 2022-02-27 DIAGNOSIS — Z7901 Long term (current) use of anticoagulants: Secondary | ICD-10-CM

## 2022-02-27 DIAGNOSIS — M7989 Other specified soft tissue disorders: Secondary | ICD-10-CM | POA: Diagnosis not present

## 2022-02-27 DIAGNOSIS — Z720 Tobacco use: Secondary | ICD-10-CM | POA: Diagnosis not present

## 2022-02-27 LAB — COMPREHENSIVE METABOLIC PANEL
ALT: 177 U/L — ABNORMAL HIGH (ref 0–44)
AST: 293 U/L — ABNORMAL HIGH (ref 15–41)
Albumin: 3.9 g/dL (ref 3.5–5.0)
Alkaline Phosphatase: 49 U/L (ref 38–126)
Anion gap: 7 (ref 5–15)
BUN: 13 mg/dL (ref 8–23)
CO2: 23 mmol/L (ref 22–32)
Calcium: 9.7 mg/dL (ref 8.9–10.3)
Chloride: 93 mmol/L — ABNORMAL LOW (ref 98–111)
Creatinine, Ser: 0.56 mg/dL (ref 0.44–1.00)
GFR, Estimated: >60 mL/min (ref >60)
Glucose, Bld: 148 mg/dL — ABNORMAL HIGH (ref 70–99)
Potassium: 3.2 mmol/L — ABNORMAL LOW (ref 3.5–5.1)
Sodium: 123 mmol/L — ABNORMAL LOW (ref 135–145)
Total Bilirubin: 2.5 mg/dL — ABNORMAL HIGH (ref 0.3–1.2)
Total Protein: 6.2 g/dL — ABNORMAL LOW (ref 6.5–8.1)

## 2022-02-27 LAB — SODIUM, URINE, RANDOM: Sodium, Ur: 15 mmol/L

## 2022-02-27 LAB — CBC WITH DIFFERENTIAL/PLATELET
Abs Immature Granulocytes: 0.06 10*3/uL (ref 0.00–0.07)
Basophils Absolute: 0 10*3/uL (ref 0.0–0.1)
Basophils Relative: 0 %
Eosinophils Absolute: 0 10*3/uL (ref 0.0–0.5)
Eosinophils Relative: 0 %
HCT: 27.7 % — ABNORMAL LOW (ref 36.0–46.0)
Hemoglobin: 10.2 g/dL — ABNORMAL LOW (ref 12.0–15.0)
Immature Granulocytes: 1 %
Lymphocytes Relative: 6 %
Lymphs Abs: 0.6 10*3/uL — ABNORMAL LOW (ref 0.7–4.0)
MCH: 35.9 pg — ABNORMAL HIGH (ref 26.0–34.0)
MCHC: 36.8 g/dL — ABNORMAL HIGH (ref 30.0–36.0)
MCV: 97.5 fL (ref 80.0–100.0)
Monocytes Absolute: 0.6 10*3/uL (ref 0.1–1.0)
Monocytes Relative: 6 %
Neutro Abs: 9.7 10*3/uL — ABNORMAL HIGH (ref 1.7–7.7)
Neutrophils Relative %: 87 %
Platelets: 222 10*3/uL (ref 150–400)
RBC: 2.84 MIL/uL — ABNORMAL LOW (ref 3.87–5.11)
RDW: 12.5 % (ref 11.5–15.5)
WBC: 11 10*3/uL — ABNORMAL HIGH (ref 4.0–10.5)
nRBC: 0 % (ref 0.0–0.2)

## 2022-02-27 LAB — OSMOLALITY: Osmolality: 261 mOsm/kg — ABNORMAL LOW (ref 275–295)

## 2022-02-27 LAB — URINALYSIS, COMPLETE (UACMP) WITH MICROSCOPIC
Bacteria, UA: NONE SEEN
Bilirubin Urine: NEGATIVE
Glucose, UA: NEGATIVE mg/dL
Hgb urine dipstick: NEGATIVE
Ketones, ur: NEGATIVE mg/dL
Leukocytes,Ua: NEGATIVE
Nitrite: NEGATIVE
Protein, ur: NEGATIVE mg/dL
Specific Gravity, Urine: 1.029 (ref 1.005–1.030)
pH: 6 (ref 5.0–8.0)

## 2022-02-27 LAB — MAGNESIUM: Magnesium: 1.3 mg/dL — ABNORMAL LOW (ref 1.7–2.4)

## 2022-02-27 LAB — HIV ANTIBODY (ROUTINE TESTING W REFLEX): HIV Screen 4th Generation wRfx: NONREACTIVE

## 2022-02-27 LAB — PHOSPHORUS: Phosphorus: 4.3 mg/dL (ref 2.5–4.6)

## 2022-02-27 LAB — ETHANOL: Alcohol, Ethyl (B): <10 mg/dL (ref <10)

## 2022-02-27 LAB — OSMOLALITY, URINE: Osmolality, Ur: 565 mOsm/kg (ref 300–900)

## 2022-02-27 LAB — CREATININE, URINE, RANDOM: Creatinine, Urine: 98.88 mg/dL

## 2022-02-27 MED ORDER — MEMANTINE HCL 10 MG PO TABS
10.0000 mg | ORAL_TABLET | Freq: Every morning | ORAL | Status: DC
  Administered 2022-02-27 – 2022-03-04 (×6): 10 mg via ORAL
  Filled 2022-02-27 (×6): qty 1

## 2022-02-27 MED ORDER — MAGNESIUM SULFATE 2 GM/50ML IV SOLN
2.0000 g | Freq: Once | INTRAVENOUS | Status: AC
  Administered 2022-02-27: 2 g via INTRAVENOUS
  Filled 2022-02-27: qty 50

## 2022-02-27 MED ORDER — OXYCODONE HCL 5 MG PO TABS
5.0000 mg | ORAL_TABLET | ORAL | Status: DC | PRN
  Administered 2022-02-27 – 2022-03-04 (×12): 5 mg via ORAL
  Filled 2022-02-27 (×12): qty 1

## 2022-02-27 MED ORDER — POTASSIUM CHLORIDE 10 MEQ/100ML IV SOLN
10.0000 meq | INTRAVENOUS | Status: AC
  Administered 2022-02-27 (×3): 10 meq via INTRAVENOUS
  Filled 2022-02-27 (×3): qty 100

## 2022-02-27 MED ORDER — TRAZODONE HCL 50 MG PO TABS
50.0000 mg | ORAL_TABLET | Freq: Every day | ORAL | Status: DC
  Administered 2022-02-27 – 2022-03-03 (×5): 50 mg via ORAL
  Filled 2022-02-27 (×5): qty 1

## 2022-02-27 MED ORDER — SODIUM CHLORIDE 1 G PO TABS
1.0000 g | ORAL_TABLET | Freq: Two times a day (BID) | ORAL | Status: DC
  Administered 2022-02-27 – 2022-03-04 (×11): 1 g via ORAL
  Filled 2022-02-27 (×10): qty 1

## 2022-02-27 MED ORDER — LORAZEPAM 1 MG PO TABS: 1.0000 mg | ORAL_TABLET | Freq: Every day | ORAL | Status: DC

## 2022-02-27 MED ORDER — POTASSIUM CHLORIDE IN NACL 20-0.9 MEQ/L-% IV SOLN: INTRAVENOUS | Status: DC

## 2022-02-27 MED ORDER — PROCHLORPERAZINE EDISYLATE 10 MG/2ML IJ SOLN
5.0000 mg | Freq: Four times a day (QID) | INTRAMUSCULAR | Status: DC | PRN
  Administered 2022-02-27: 5 mg via INTRAVENOUS
  Filled 2022-02-27: qty 2

## 2022-02-27 MED ORDER — METOPROLOL TARTRATE 5 MG/5ML IV SOLN
2.5000 mg | Freq: Four times a day (QID) | INTRAVENOUS | Status: AC
  Administered 2022-02-27 – 2022-03-01 (×9): 2.5 mg via INTRAVENOUS
  Filled 2022-02-27 (×9): qty 5

## 2022-02-27 MED ORDER — POTASSIUM CHLORIDE CRYS ER 20 MEQ PO TBCR: 40.0000 meq | EXTENDED_RELEASE_TABLET | Freq: Once | ORAL | Status: DC

## 2022-02-27 MED ORDER — CHLORHEXIDINE GLUCONATE CLOTH 2 % EX PADS
6.0000 | MEDICATED_PAD | Freq: Every day | CUTANEOUS | Status: DC
  Administered 2022-02-27 – 2022-03-02 (×3): 6 via TOPICAL

## 2022-02-27 MED ORDER — FENTANYL CITRATE PF 50 MCG/ML IJ SOSY
50.0000 ug | PREFILLED_SYRINGE | Freq: Once | INTRAMUSCULAR | Status: AC
  Administered 2022-02-27: 50 ug via INTRAVENOUS
  Filled 2022-02-27: qty 1

## 2022-02-27 MED ORDER — METOPROLOL SUCCINATE ER 25 MG PO TB24
25.0000 mg | ORAL_TABLET | Freq: Every day | ORAL | Status: DC
Start: 2022-02-27 — End: 2022-02-27

## 2022-02-27 MED ORDER — PANTOPRAZOLE SODIUM 40 MG IV SOLR
40.0000 mg | Freq: Once | INTRAVENOUS | Status: AC
  Administered 2022-02-27: 40 mg via INTRAVENOUS
  Filled 2022-02-27: qty 10

## 2022-02-27 MED ORDER — PNEUMOCOCCAL 20-VAL CONJ VACC 0.5 ML IM SUSY: 0.5000 mL | PREFILLED_SYRINGE | INTRAMUSCULAR | Status: DC

## 2022-02-27 MED ORDER — AMIODARONE HCL 200 MG PO TABS: 200.0000 mg | ORAL_TABLET | Freq: Every day | ORAL | Status: DC

## 2022-02-27 NOTE — Progress Notes (Signed)
Patient meds sent to pharmacy.

## 2022-02-27 NOTE — Assessment & Plan Note (Addendum)
PT eval>>outpt PT

## 2022-02-27 NOTE — Assessment & Plan Note (Addendum)
Appears clinically euvolemic Daily weights Hold home dose furosemide temporarily

## 2022-02-27 NOTE — Assessment & Plan Note (Addendum)
This has been chronic and multifactorial -Including chronic alcohol use, poor solute intake, SIADH, CHF -Continue home dose sodium chloride tablets, 1 g twice daily -Baseline sodium 125-130 Presented with sodium 123 Urine osmolarity--565 Serum osmolarity--261 FeNa 0.1% Na back to baseline after isotonic saline

## 2022-02-27 NOTE — Evaluation (Signed)
Physical Therapy Evaluation Patient Details Name: Cynthia Kelley MRN: [id number] DOB: 04-May-1956 Today's Date: 02/27/2022  History of Present Illness  Patient states that her leg gave out while walking, she denies hitting her head and denies loss of consciousness.  Right arm had increased pain therefore she came to the ER, arm was + for fx. Pt has history of metastatic squamous cell carcinoma of the lung to the liver, paroxysmal atrial fibrillation, diastolic CHF, hypertension, chronic to impairment, hyponatremia, COPD, alcohol and tobacco abuse  Clinical Impression  Pt is mod I with mobility but wears out quickly.  Pt fatigue after ambulating 112 ft.  Pt will benefit from skilled PT to improve balance and strength to improve safety of ambulation.        Recommendations for follow up therapy are one component of a multi-disciplinary discharge planning process, led by the attending physician.  Recommendations may be updated based on patient status, additional functional criteria and insurance authorization.  Follow Up Recommendations Outpatient PT    Assistance Recommended at Discharge Intermittent Supervision/Assistance  Patient can return home with the following  A little help with walking and/or transfers;A lot of help with bathing/dressing/bathroom;Assistance with cooking/housework;Help with stairs or ramp for entrance    Equipment Recommendations  None- Pt states that she will not do well with a cane.   Recommendations for Other Services       Functional Status Assessment Patient has had a recent decline in their functional status and demonstrates the ability to make significant improvements in function in a reasonable and predictable amount of time.     Precautions / Restrictions Precautions Precautions: Fall;Shoulder Shoulder Interventions: Shoulder sling/immobilizer;At all times Restrictions Weight Bearing Restrictions: Yes RUE Weight Bearing: Non weight bearing      Mobility   Bed Mobility Overal bed mobility: Modified Independent                  Transfers Overall transfer level: Modified independent Equipment used: None                    Ambulation/Gait Ambulation/Gait assistance: Modified independent (Device/Increase time) Gait Distance (Feet): 112 Feet Assistive device: IV Pole, None (IV pole for 70' then nothing for 56'.) Gait Pattern/deviations: Step-through pattern   Gait velocity interpretation: 1.31 - 2.62 ft/sec, indicative of limited community ambulator            Pertinent Vitals/Pain Pain Assessment Pain Assessment: 0-10 Pain Score: 7  Pain Location: shoulder Pain Intervention(s): Limited activity within patient's tolerance, Other (comment) (Rn gave meds after session)    Home Living Family/patient expects to be discharged to:: Private residence Living Arrangements: Spouse/significant other Available Help at Discharge: Family Type of Home: House         Home Layout: One level        Prior Function Prior Level of Function : Independent/Modified Independent                        Extremity/Trunk Assessment    General weakness             Communication   Communication: No difficulties  Cognition Arousal/Alertness: Awake/alert Behavior During Therapy: WFL for tasks assessed/performed Overall Cognitive Status: Within Functional Limits for tasks assessed  Assessment/Plan    PT Assessment Patient needs continued PT services  PT Problem List Decreased strength;Decreased activity tolerance;Pain;Decreased balance       PT Treatment Interventions   Balance and strengthening exercises.   PT Goals (Current goals can be found in the Care Plan section)  Acute Rehab PT Goals Patient Stated Goal: For pt to get stronger PT Goal Formulation: With patient Time For Goal Achievement: 03/13/22 Potential to Achieve Goals: Good     Frequency  2x a week         AM-PAC PT "6 Clicks" Mobility  Outcome Measure Help needed turning from your back to your side while in a flat bed without using bedrails?: A Little Help needed moving from lying on your back to sitting on the side of a flat bed without using bedrails?: None Help needed moving to and from a bed to a chair (including a wheelchair)?: None Help needed standing up from a chair using your arms (e.g., wheelchair or bedside chair)?: None Help needed to walk in hospital room?: None Help needed climbing 3-5 steps with a railing? : A Little 6 Click Score: 22    End of Session Equipment Utilized During Treatment: Gait belt Activity Tolerance: Patient tolerated treatment well Patient left: in bed Nurse Communication: Mobility status PT Visit Diagnosis: Unsteadiness on feet (R26.81);Pain;Muscle weakness (generalized) (M62.81);History of falling (Z91.81) Pain - Right/Left: Right Pain - part of body: Shoulder    Time: 1100-1125 PT Time Calculation (min) (ACUTE ONLY): 25 min   Charges:   PT Evaluation $PT Eval Low Complexity: [place], PT CLT [phone]  02/27/2022, 11:41 AM

## 2022-02-27 NOTE — Progress Notes (Signed)
PROGRESS NOTE  Cynthia Kelley SHF:[id number] DOB: 06-16-1956 DOA: 02/27/2022 PCP: Lindell Spar, MD  Brief History:  66 year old female with a history of metastatic squamous cell carcinoma of the lung to the liver, paroxysmal atrial fibrillation, diastolic CHF, hypertension, chronic to impairment, hyponatremia, COPD, alcohol and tobacco abuse presenting with generalized weakness and falls.  Apparently, the patient had a mechanical fall on the evening of 02/26/2022.  She stated that she slipped on a rug and fell onto her right side.  She denied hitting her head or loss of consciousness.  She denies any new medications.  However, she states that she continues to drink alcohol on a daily basis.  She states that she drinks 2 beers and 3 mixed drinks on a daily basis.  She continues to smoke 1 pack/day. Patient denies fevers, chills, headache, chest pain, dyspnea, nausea, vomiting, diarrhea, abdominal pain, dysuria, hematuria, hematochezia, and melena. In the ED, the patient was afebrile and hemodynamically stable with oxygen saturation 96% on room air.  BMP showed a sodium 123, potassium 3.2, bicarbonate 23, serum creatinine 0.56.  AST 293, ALT to 177, total bilirubin 2.5, magnesium 1.3.  WBC 11.0, hemoglobin 10.3, platelets 222,000.  EKG shows sinus rhythm with nonspecific T wave changes.  CT of the brain showed generalized atrophy.  X-ray of the right shoulder showed an acute displaced fracture of the right humerus head and surgical neck.  Orthopedics was consulted.     Assessment and Plan: * Humeral surgical neck fracture Orthopedics consulted --remain in sling for now  Paroxysmal atrial fibrillation (HCC) Currently in sinus rhythm Holding amiodarone secondary to elevated LFTs\ Holding apixaban in preparation for orthopedic intervention Restart metoprolol succinate at lower dose (IV lopressor until allowed to take po)   Hypomagnesemia Replete  Dementia without behavioral disturbance  (HCC) Continue Namenda  Transaminitis RUQ ultrasound Hold amiodarone  Fall at home, initial encounter PT eval  Hypokalemia Replete  Hyponatremia This has been chronic and multifactorial -Including chronic alcohol use, poor solute intake, SIADH, CHF -Continue home dose sodium chloride tablets, 1 g twice daily -Baseline sodium 125-130 Presented with sodium 123 Urine osmolarity Serum osmolarity Urine sodium Further changes pending above work-up  Anxiety PDMP reviewed -Last prescription for Ativan 1 mg, #30 on 10/06/2021 Start alcohol withdrawal protocol  Chronic diastolic CHF (congestive heart failure) ([place]) Appears clinically euvolemic Daily weights Hold home dose furosemide temporarily  Lung cancer, primary, with metastasis from lung to other site [facility]) Last follow-up 02/02/2022 at [facility] -- Status post chemoradiation -- 02/02/2022 CT abdomen pelvis and chest did not show any new metastasis; there was stable pulmonary nodules -- Currently under ongoing surveillance with instructions to follow-up in 3 months  Tobacco abuse Tobacco cessation discussed       Family Communication:  no  Family at bedside  Consultants:  ortho  Code Status:  DNR  DVT Prophylaxis:  SCDs   Procedures: As Listed in Progress Note Above  Antibiotics: None      Subjective: She complains of right shoulder pain.  Denies f/c, cp, sob, n/v/d, abd pain, headache  Objective: Vitals:   02/27/22 0300 02/27/22 0435 02/27/22 0440 02/27/22 0544  BP: (!) 182/72   (!) 122/55  Pulse: 91   81  Resp: 14   18  Temp:   99.2 F (37.3 C) 98.9 F (37.2 C)  TempSrc:   Oral Oral  SpO2: 96% 96%  96%  Weight:    54.8 kg  Height:        Intake/Output Summary (Last 24 hours) at 02/27/2022 0923 Last data filed at 02/27/2022 0900 Gross per 24 hour  Intake 0 ml  Output --  Net 0 ml   Weight change:  Exam:  General:  Pt is alert, follows commands appropriately, not in acute distress HEENT: No  icterus, No thrush, No neck mass, [place]/AT Cardiovascular: RRR, S1/S2, no rubs, no gallops Respiratory: CTA bilaterally, no wheezing, no crackles, no rhonchi Abdomen: Soft/+BS, non tender, non distended, no guarding Extremities: No edema, No lymphangitis, No petechiae, No rashes, no synovitis   Data Reviewed: I have personally reviewed following labs and imaging studies Basic Metabolic Panel: Recent Labs  Lab 02/27/22 0318  NA 123*  K 3.2*  CL 93*  CO2 23  GLUCOSE 148*  BUN 13  CREATININE 0.56  CALCIUM 9.7  MG 1.3*  PHOS 4.3   Liver Function Tests: Recent Labs  Lab 02/27/22 0318  AST 293*  ALT 177*  ALKPHOS 49  BILITOT 2.5*  PROT 6.2*  ALBUMIN 3.9   No results for input(s): LIPASE, AMYLASE in the last 168 hours. No results for input(s): AMMONIA in the last 168 hours. Coagulation Profile: No results for input(s): INR, PROTIME in the last 168 hours. CBC: Recent Labs  Lab 02/27/22 0318  WBC 11.0*  NEUTROABS 9.7*  HGB 10.2*  HCT 27.7*  MCV 97.5  PLT 222   Cardiac Enzymes: No results for input(s): CKTOTAL, CKMB, CKMBINDEX, TROPONINI in the last 168 hours. BNP: Invalid input(s): POCBNP CBG: No results for input(s): GLUCAP in the last 168 hours. HbA1C: No results for input(s): HGBA1C in the last 72 hours. Urine analysis:    Component Value Date/Time   COLORURINE YELLOW 02/27/2022 0823   APPEARANCEUR CLEAR 02/27/2022 0823   LABSPEC 1.029 02/27/2022 0823   PHURINE 6.0 02/27/2022 0823   GLUCOSEU NEGATIVE 02/27/2022 0823   HGBUR NEGATIVE 02/27/2022 0823   BILIRUBINUR NEGATIVE 02/27/2022 0823   BILIRUBINUR small 09/15/2017 1210   KETONESUR NEGATIVE 02/27/2022 0823   PROTEINUR NEGATIVE 02/27/2022 0823   UROBILINOGEN 0.2 09/15/2017 1210   NITRITE NEGATIVE 02/27/2022 0823   LEUKOCYTESUR NEGATIVE 02/27/2022 0823   Sepsis Labs: @LABRCNTIP (procalcitonin:4,lacticidven:4) )No results found for this or any previous visit (from the past 240 hour(s)).   Scheduled  Meds:  memantine  10 mg Oral q morning   metoprolol tartrate  2.5 mg Intravenous Q6H   [START ON 02/28/2022] pneumococcal 20-valent conjugate vaccine  0.5 mL Intramuscular Tomorrow-1000   potassium chloride  40 mEq Oral Once   sodium chloride  1 g Oral BID WC   Continuous Infusions:  magnesium sulfate bolus IVPB      Procedures/Studies: CT Head Wo Contrast  Result Date: 02/27/2022 CLINICAL DATA:  Worsening weakness and increased falls. EXAM: CT HEAD WITHOUT CONTRAST TECHNIQUE: Contiguous axial images were obtained from the base of the skull through the vertex without intravenous contrast. RADIATION DOSE REDUCTION: This exam was performed according to the departmental dose-optimization program which includes automated exposure control, adjustment of the mA and/or kV according to patient size and/or use of iterative reconstruction technique. COMPARISON:  None Available. FINDINGS: Brain: There is mild cerebral atrophy with widening of the extra-axial spaces and ventricular dilatation. There are areas of decreased attenuation within the white matter tracts of the supratentorial brain, consistent with microvascular disease changes. Vascular: No hyperdense vessel or unexpected calcification. Skull: Normal. Negative for fracture or focal lesion. Sinuses/Orbits: No acute finding. Other: None. IMPRESSION: 1. No acute intracranial  abnormality. 2. Generalized cerebral atrophy with chronic white matter small vessel ischemic changes. Electronically Signed   By: Virgina Norfolk M.D.   On: 02/27/2022 04:08   CT Shoulder Right Wo Contrast  Result Date: 02/27/2022 CLINICAL DATA:  Shoulder trauma, fracture of humerus or scapula EXAM: CT OF THE UPPER RIGHT EXTREMITY WITHOUT CONTRAST TECHNIQUE: Multidetector CT imaging of the upper right extremity was performed according to the standard protocol. RADIATION DOSE REDUCTION: This exam was performed according to the departmental dose-optimization program which includes  automated exposure control, adjustment of the mA and/or kV according to patient size and/or use of iterative reconstruction technique. COMPARISON:  X-ray 02/27/2022, CT 08/05/2021 FINDINGS: Bones/Joint/Cartilage Acute heavily comminuted fracture of the right humeral head and neck. Transversely oriented component through the surgical neck which is anteromedially displaced relative to the humeral head component. The proximal humeral shaft is perched upon the anteroinferior glenoid rim (series 4, image 36). Humeral head fragment is inferiorly subluxed relative to the glenoid but not dislocated. Large 3.0 x 1.8 x 3.0 cm fracture fragment involving the greater tuberosity is posteromedially displaced relative to the humeral head within the joint space. Numerous small comminuted fracture fragments surround the humeral head and neck. Bones appear demineralized and slightly heterogeneous and pathologic fracture of an underlying bone lesion is not excluded, particularly given history of cancer. Remaining osseous structures appear otherwise intact. AC joint within normal limits. Ligaments Suboptimally assessed by CT. Muscles and Tendons Presumed osseous avulsion of the rotator cuff tendons with displaced greater tuberosity fracture. Enlargement of the rotator cuff musculature likely related to intramuscular hematoma formation. Soft tissues Prominent soft tissue swelling about the shoulder and proximal upper arm. Emphysematous changes within the right lung. 6 mm subpleural nodule at the right lung apex is stable from 08/05/2021. Right chest port is in place. IMPRESSION: Acute heavily comminuted and displaced fracture of the right humeral head and neck, as described above. Bones appear demineralized and slightly heterogeneous and pathologic fracture of an underlying bone lesion is not excluded, particularly given history of cancer. Electronically Signed   By: Davina Poke D.O.   On: 02/27/2022 09:11   DG Humerus  Right  Result Date: 02/27/2022 CLINICAL DATA:  Increased falls. EXAM: RIGHT HUMERUS - 2+ VIEW COMPARISON:  None Available. FINDINGS: Acute, comminuted fracture deformity is seen involving the head and surgical neck of the proximal right humerus. Approximately 1 shaft width medial displacement of the distal fracture site is seen. Dislocation of the right humeral head cannot be excluded. Diffuse soft tissue swelling is noted. IMPRESSION: Acute, displaced fracture deformity involving the head and surgical neck of the proximal right humerus. Electronically Signed   By: Virgina Norfolk M.D.   On: 02/27/2022 02:46    Orson Eva, DO  [facility]  If 7PM-7AM, please contact night-coverage [email or web address] Password [email or web address] 02/27/2022, 9:23 AM   LOS: 0 days

## 2022-02-27 NOTE — Assessment & Plan Note (Addendum)
Replete Mag 1.9

## 2022-02-27 NOTE — Assessment & Plan Note (Signed)
Continue Namenda

## 2022-02-27 NOTE — Assessment & Plan Note (Addendum)
PDMP reviewed -Last prescription for Ativan 1 mg, #30 on 10/06/2021

## 2022-02-27 NOTE — Assessment & Plan Note (Signed)
Last follow-up 02/02/2022 at [facility] -- Status post chemoradiation -- 02/02/2022 CT abdomen pelvis and chest did not show any new metastasis; there was stable pulmonary nodules -- Currently under ongoing surveillance with instructions to follow-up in 3 months

## 2022-02-27 NOTE — Assessment & Plan Note (Addendum)
RUQ ultrasound--hepatic steatosis Hold amiodarone LFTs continue to trend up GI consult

## 2022-02-27 NOTE — ED Provider Notes (Signed)
[facility] EMERGENCY DEPARTMENT  Provider Note  CSN: [id number] Arrival date & time: 02/27/22 0206  History Chief Complaint  Patient presents with   Lytle Michaels    Cynthia Kelley is a 66 y.o. female with history of multiple medical problems including memory issues, lung cancer in remission, afib on eliquis, chronic hyponatremia brought to the ED via EMS from home for evaluation of RUE pain/bruising after a fall >24 hours ago. She is unclear on the details but states her legs gave out while walking. She denies head injury. Complaining of pain in R arm, worse with movement.    Home Medications Prior to Admission medications   Medication Sig Start Date End Date Taking? Authorizing Provider  amiodarone (PACERONE) 200 MG tablet Take 200 mg by mouth daily. 02/06/21   [provider]  apixaban (ELIQUIS) 5 MG TABS tablet Take 5 mg by mouth 2 (two) times daily. 09/27/20   [provider]  furosemide (LASIX) 20 MG tablet Take 1 tablet (20 mg total) by mouth daily. 01/15/21   Orson Eva, MD  LORazepam (ATIVAN) 1 MG tablet Take 1 mg by mouth at bedtime.    [provider]  memantine (NAMENDA) 10 MG tablet Take 10 mg by mouth every morning. 02/05/21   [provider]  metoprolol (TOPROL-XL) 200 MG 24 hr tablet Take 200 mg by mouth daily. 01/02/21   [provider]  Multiple Vitamins-Minerals (MULTIVITAMIN WITH MINERALS) tablet Take 1 tablet by mouth daily.    [provider]  potassium chloride SA (K-DUR,KLOR-CON) 20 MEQ tablet Take 20 mEq by mouth daily.    [provider]  sodium chloride 1 g tablet Take 1 tablet (1 g total) by mouth 2 (two) times daily with a meal. 03/06/21   Barton Dubois, MD     Allergies    Patient has no known allergies.   Review of Systems   Review of Systems Please see HPI for pertinent positives and negatives  Physical Exam BP (!) 182/72   Pulse 91   Resp 14   Ht 5\' 6"  (1.676 m)   Wt 61.2 kg   SpO2 96%   BMI  21.79 kg/m   Physical Exam Vitals and nursing note reviewed.  Constitutional:      Appearance: Normal appearance.  HENT:     Head: Normocephalic and atraumatic.     Nose: Nose normal.     Mouth/Throat:     Mouth: Mucous membranes are moist.  Eyes:     Extraocular Movements: Extraocular movements intact.     Conjunctiva/sclera: Conjunctivae normal.  Cardiovascular:     Rate and Rhythm: Normal rate.  Pulmonary:     Effort: Pulmonary effort is normal.     Breath sounds: Normal breath sounds.  Abdominal:     General: Abdomen is flat.     Palpations: Abdomen is soft.     Tenderness: There is no abdominal tenderness.  Musculoskeletal:        General: Swelling and tenderness present.     Cervical back: Neck supple. No tenderness.     Comments: Extensive bruising of proximal RUE and R chest wall, distally NVI. Pain with palpation, unable to ROM shoulder.   Skin:    General: Skin is warm and dry.  Neurological:     General: No focal deficit present.     Mental Status: She is alert.  Psychiatric:        Mood and Affect: Mood normal.    ED Results /  Procedures / Treatments   EKG EKG Interpretation  Date/Time:  Saturday February 27 2022 02:46:09 EDT Ventricular Rate:  82 PR Interval:  179 QRS Duration: 90 QT Interval:  414 QTC Calculation: 484 R Axis:   60 Text Interpretation: Sinus rhythm Atrial premature complex Probable anteroseptal infarct, old Since last tracing T wave amplitude has decreased in multiple leads Confirmed by Calvert Cantor [phone]) on 02/27/2022 2:49:12 AM  Procedures Procedures  Medications Ordered in the ED Medications  fentaNYL (SUBLIMAZE) injection 50 mcg (50 mcg Intravenous Given 02/27/22 0324)  pantoprazole (PROTONIX) injection 40 mg (40 mg Intravenous Given 02/27/22 0428)    Initial Impression and Plan  Patient with RUE injury in fall, unclear etiology of fall other than she states her legs gave out. Will check labs, xray, CT head given some confusion  over what happened. Pain medication for comfort.   ED Course   Clinical Course as of 02/27/22 0442  Sat Feb 27, 2022  0249 I personally viewed the images from radiology studies and agree with radiologist interpretation: Xray shows displaced proximal humerus fracture.   [CS]  7902 CBC with mild anemia, improved. Anemia is at baseline.  [CS]  0345 CMP with hyponatremia, worsened from baseline may be contributing to her weakness. LFTs also elevated, will add EtOH given AST>ALT. [CS]  [phone] Spoke with Dr. Amedeo Kinsman, Ortho, no surgery anticipated over the weekend. He will consult during admission for her other medical issues. Requests a CT shoulder to further evaluate. Hospitalist paged.  [CS]  0411 I personally viewed the images from radiology studies and agree with radiologist interpretation: CT head negative.   [CS]  18 Spoke with Dr. Josephine Cables, Hospitalist, who will evaluate for admission.  [CS]    Clinical Course User Index [CS] Truddie Hidden, MD     MDM Rules/Calculators/A&P Medical Decision Making Problems Addressed: Elevated liver enzymes: acute illness or injury Fall, initial encounter: acute illness or injury Hyponatremia: chronic illness or injury with exacerbation, progression, or side effects of treatment Other closed displaced fracture of proximal end of right humerus, initial encounter: acute illness or injury that poses a threat to life or bodily functions  Amount and/or Complexity of Data Reviewed Labs: ordered. Decision-making details documented in ED Course. Radiology: ordered and independent interpretation performed. Decision-making details documented in ED Course.  Risk Prescription drug management. Parenteral controlled substances. Decision regarding hospitalization.    Final Clinical Impression(s) / ED Diagnoses Final diagnoses:  Fall, initial encounter  Other closed displaced fracture of proximal end of right humerus, initial encounter  Hyponatremia   Elevated liver enzymes    Rx / DC Orders ED Discharge Orders     None        Truddie Hidden, MD 02/27/22 [phone]

## 2022-02-27 NOTE — H&P (Signed)
History and Physical    Patient: Cynthia Kelley VQQ:[id number] DOB: 08/29/1956 DOA: 02/27/2022 DOS: the patient was seen and examined on 02/27/2022 PCP: Lindell Spar, MD  Patient coming from: Home  Chief Complaint:  Chief Complaint  Patient presents with   Fall   HPI: Cynthia Kelley is a 66 y.o. female with medical history significant of persistent atrial fibrillation on Eliquis, metastatic squamous cell carcinoma of lung in remission, anxiety, chronic hyponatremia who presents to the emergency department via EMS from home due to pain in her right arm and bruising after a fall in more than 1 day ago.  Patient states that her leg gave out while walking, she denies hitting her head and denies loss of consciousness.  Right arm was since with movement.  ED Course:  In the emergency department, BP was 102/72 but other vital signs were within normal range.  Work-up in the ED showed normocytic anemia, hyponatremia, hypokalemia, hyperglycemia, transaminitis. CT head without contrast showed no acute intracranial abnormality Right humerus x-ray showed acute displaced fracture deformity involving the head and surgical neck of the proximal right humerus. Patient was treated with IV fentanyl and IV Protonix was given. Orthopedic surgeon Dr. Amedeo Kinsman was consulted by ED physician and he will consult with patient on admission.  Hospitalist was asked to admit patient for further evaluation and management.  Review of Systems: Review of systems as noted in the HPI. All other systems reviewed and are negative.   Past Medical History:  Diagnosis Date   Acute diastolic congestive heart failure, NYHA class 2 ([place]) 11/11/2020   Anemia 11/22/2017   Anxiety 11/19/2020   Arthritis    Asymptomatic PVD (peripheral vascular disease) ([place]) 12/14/2016   Suspected.  Hands hyperemic with decreased cap refill. ? History vasospasm   COPD with acute exacerbation ([place]) 01/14/2021   FRACTURE, TIBIAL PLATEAU 05/11/2010    Qualifier: Diagnosis of  By: Aline Brochure MD, [place]     Lung cancer, primary, with metastasis from lung to other site [facility]) 01/16/2018   Metastatic to the liver   Nuclear sclerotic cataract of both eyes 06/08/2018   Osteopenia 05/15/2013   Osteoporosis    osteopenia   Tobacco abuse 12/14/2016   Past Surgical History:  Procedure Laterality Date   FRACTURE SURGERY     torn ACL   KNEE ARTHROSCOPY      Social History:  reports that she has been smoking cigarettes. She started smoking about 58 years ago. She has a 10.00 pack-year smoking history. She has never used smokeless tobacco. She reports current alcohol use of about 20.0 standard drinks per week. She reports that she does not use drugs.   No Known Allergies  Family History  Problem Relation Age of Onset   Cancer Father        died in 42's everywhere   Heart disease Mother    Cancer Mother        lung   Stroke Mother    Asthma Daughter      Prior to Admission medications   Medication Sig Start Date End Date Taking? Authorizing Provider  amiodarone (PACERONE) 200 MG tablet Take 200 mg by mouth daily. 02/06/21   [provider]  apixaban (ELIQUIS) 5 MG TABS tablet Take 5 mg by mouth 2 (two) times daily. 09/27/20   [provider]  furosemide (LASIX) 20 MG tablet Take 1 tablet (20 mg total) by mouth daily. 01/15/21   Orson Eva, MD  LORazepam (ATIVAN) 1 MG tablet Take 1 mg  by mouth at bedtime.    [provider]  memantine (NAMENDA) 10 MG tablet Take 10 mg by mouth every morning. 02/05/21   [provider]  metoprolol (TOPROL-XL) 200 MG 24 hr tablet Take 200 mg by mouth daily. 01/02/21   [provider]  Multiple Vitamins-Minerals (MULTIVITAMIN WITH MINERALS) tablet Take 1 tablet by mouth daily.    [provider]  potassium chloride SA (K-DUR,KLOR-CON) 20 MEQ tablet Take 20 mEq by mouth daily.    [provider]  sodium chloride 1 g tablet Take 1 tablet (1 g total) by  mouth 2 (two) times daily with a meal. 03/06/21   Barton Dubois, MD    Physical Exam: BP (!) 182/72   Pulse 91   Temp 99.2 F (37.3 C) (Oral)   Resp 14   Ht 5\' 6"  (1.676 m)   Wt 61.2 kg   SpO2 96%   BMI 21.79 kg/m   General: 66 y.o. year-old female well developed well nourished in no acute distress.  Alert and oriented x3. HEENT: NCAT, EOMI Neck: Supple, trachea medial Cardiovascular: Regular rate and rhythm with no rubs or gallops.  No thyromegaly or JVD noted.  No lower extremity edema. 2/4 pulses in all 4 extremities. Respiratory: Clear to auscultation with no wheezes or rales. Good inspiratory effort. Abdomen: Soft, nontender nondistended with normal bowel sounds x4 quadrants. Muskuloskeletal: RUE was noted to be bruised, swollen, tender to palpation and have restricted ROM of the shoulder due to pain.  Neuro: CN II-XII intact, sensation, reflexes intact Skin: No ulcerative lesions noted or rashes Psychiatry: Judgement and insight appear normal. Mood is appropriate for condition and setting          Labs on Admission:  Basic Metabolic Panel: Recent Labs  Lab 02/27/22 0318  NA 123*  K 3.2*  CL 93*  CO2 23  GLUCOSE 148*  BUN 13  CREATININE 0.56  CALCIUM 9.7  MG 1.3*  PHOS 4.3   Liver Function Tests: Recent Labs  Lab 02/27/22 0318  AST 293*  ALT 177*  ALKPHOS 49  BILITOT 2.5*  PROT 6.2*  ALBUMIN 3.9   No results for input(s): LIPASE, AMYLASE in the last 168 hours. No results for input(s): AMMONIA in the last 168 hours. CBC: Recent Labs  Lab 02/27/22 0318  WBC 11.0*  NEUTROABS 9.7*  HGB 10.2*  HCT 27.7*  MCV 97.5  PLT 222   Cardiac Enzymes: No results for input(s): CKTOTAL, CKMB, CKMBINDEX, TROPONINI in the last 168 hours.  BNP (last 3 results) Recent Labs    03/03/21 0002  BNP 1,352.0*    ProBNP (last 3 results) No results for input(s): PROBNP in the last 8760 hours.  CBG: No results for input(s): GLUCAP in the last 168  hours.  Radiological Exams on Admission: CT Head Wo Contrast  Result Date: 02/27/2022 CLINICAL DATA:  Worsening weakness and increased falls. EXAM: CT HEAD WITHOUT CONTRAST TECHNIQUE: Contiguous axial images were obtained from the base of the skull through the vertex without intravenous contrast. RADIATION DOSE REDUCTION: This exam was performed according to the departmental dose-optimization program which includes automated exposure control, adjustment of the mA and/or kV according to patient size and/or use of iterative reconstruction technique. COMPARISON:  None Available. FINDINGS: Brain: There is mild cerebral atrophy with widening of the extra-axial spaces and ventricular dilatation. There are areas of decreased attenuation within the white matter tracts of the supratentorial brain, consistent with microvascular disease changes. Vascular: No hyperdense vessel or  unexpected calcification. Skull: Normal. Negative for fracture or focal lesion. Sinuses/Orbits: No acute finding. Other: None. IMPRESSION: 1. No acute intracranial abnormality. 2. Generalized cerebral atrophy with chronic white matter small vessel ischemic changes. Electronically Signed   By: Virgina Norfolk M.D.   On: 02/27/2022 04:08   DG Humerus Right  Result Date: 02/27/2022 CLINICAL DATA:  Increased falls. EXAM: RIGHT HUMERUS - 2+ VIEW COMPARISON:  None Available. FINDINGS: Acute, comminuted fracture deformity is seen involving the head and surgical neck of the proximal right humerus. Approximately 1 shaft width medial displacement of the distal fracture site is seen. Dislocation of the right humeral head cannot be excluded. Diffuse soft tissue swelling is noted. IMPRESSION: Acute, displaced fracture deformity involving the head and surgical neck of the proximal right humerus. Electronically Signed   By: Virgina Norfolk M.D.   On: 02/27/2022 02:46    EKG: I independently viewed the EKG done and my findings are as followed: Normal  sinus rhythm at rate of 82 bpm with APCs  Assessment/Plan Present on Admission:  Humeral surgical neck fracture  Tobacco abuse  Lung cancer, primary, with metastasis from lung to other site [facility])  Hyponatremia  Hypokalemia  Anxiety  Principal Problem:   Humeral surgical neck fracture Active Problems:   Tobacco abuse   Lung cancer, primary, with metastasis from lung to other site [facility])   Anxiety   Hyponatremia   Hypokalemia   Fall at home, initial encounter   Transaminitis   Hyperglycemia   Dementia without behavioral disturbance ([place])   Acute displaced fracture of head and surgical neck of proximal right humerus Right humerus x-ray showed acute displaced fracture deformity involving the head and surgical neck of the proximal right humerus. Continue fall precaution and neurochecks Continue oxycodone 5 mg every 4 hours as needed Consider PT/OT eval and treat after surgical repair Orthopedic surgeon (Dr. Amedeo Kinsman) was already consulted and will follow-up with patient on consult  Fall at home Continue fall precaution and neurochecks Continue PT/OT eval and treat  Chronic hyponatremia Continue home sodium chloride  Hypokalemia K+ 3.2, this will be replenished  Hyperglycemia possibly reactive CBG 148, continue to monitor blood glucose level  Transaminitis Presents with elevated liver enzymes AST 293, ALT 177 Continue to monitor liver enzymes  Persistent atrial fibrillation Continue amiodarone, hold metoprolol at this time due to soft BP Temporarily hold Eliquis pending orthopedic surgeon plan for surgical repair of patient  Dementia Continue Namenda  Anxiety Continue Ativan per home regimen   DVT prophylaxis: SCDs (consider starting patient's home Eliquis if no indication for surgery after being seen by orthopedic surgeon)   Advance Care Planning:   Code Status: DNR   Consults: Orthopedic surgery  Family Communication: None at bedside  Severity of  Illness: The appropriate patient status for this patient is INPATIENT. Inpatient status is judged to be reasonable and necessary in order to provide the required intensity of service to ensure the patient's safety. The patient's presenting symptoms, physical exam findings, and initial radiographic and laboratory data in the context of their chronic comorbidities is felt to place them at high risk for further clinical deterioration. Furthermore, it is not anticipated that the patient will be medically stable for discharge from the hospital within 2 midnights of admission.   * I certify that at the point of admission it is my clinical judgment that the patient will require inpatient hospital care spanning beyond 2 midnights from the point of admission due to high intensity of service, high  risk for further deterioration and high frequency of surveillance required.*  Author: Bernadette Hoit, DO 02/27/2022 5:42 AM  For on call review [email or web address].

## 2022-02-27 NOTE — ED Notes (Signed)
PT returned from CT. Per CT, patient had one episode of vomiting. Cynthia Kelley Cynthia Kelley

## 2022-02-27 NOTE — ED Triage Notes (Signed)
Pt arrived via [facility] from home c/o worsening weakness and increased falls. Pt presents with significant bruising and swelling to right shoulder, upper arm and right rib cage. Pt denies LOC. Pt currently taking Eliquis.

## 2022-02-27 NOTE — Assessment & Plan Note (Signed)
Replete

## 2022-02-27 NOTE — Consult Note (Signed)
ORTHOPAEDIC CONSULTATION  REQUESTING PHYSICIAN: Orson Eva, MD  ASSESSMENT AND PLAN: 66 y.o. female with the following: Comminuted right proximal humerus fracture   Mrs. Warzecha has sustained a severe injury to her right proximal humerus.  She has a severely comminuted fracture that I do not think is amenable to operative repair.  As such, if surgery is a consideration, I would recommend a reverse shoulder arthroplasty.  This is a big procedure.  She has multiple medical comorbidities that would increase her risk of complications perioperatively.  In addition, these medical issues would also complicate the surgery and I do not think we have the resources to appropriately take care of the patient at [facility].  There is no urgency for surgery as she is currently taking Eliquis, and ideally, we would have to wait 5 days before proceeding with surgery.     The other option is nonoperative management in a sling.  I believe her pain will improve, but she may have limited function.  This would be the low risk option.   All of this was discussed with the patient, and her husband, separately.  They will continue to discuss and I will return to discuss things further tomorrow.   - Weight Bearing Status/Activity: NWB RUE in a sling  - Additional recommended labs/tests: None  -VTE Prophylaxis: Continue Eliquis for now  - Pain control: PRN pain medications  - Follow-up plan: TBD  -Procedures: None planned  Chief Complaint: Right shoulder pain  HPI: Krisinda Giovanni is a 66 y.o. RHD female with PMH listed below who presented to the ED after sustaining a mechanical fall.  She lost her balance and landed on her right shoulder.  She noted immediate pain.  Her husband brought her to the ED.  No pain elsewhere.  She did not hit her head.  No numbness or tingling in the right arm.  She is complaining of pain.  It is worsened with movement.   Past Medical History:  Diagnosis Date   Acute diastolic  congestive heart failure, NYHA class 2 ([place]) 11/11/2020   Anemia 11/22/2017   Anxiety 11/19/2020   Arthritis    Asymptomatic PVD (peripheral vascular disease) ([place]) 12/14/2016   Suspected.  Hands hyperemic with decreased cap refill. ? History vasospasm   COPD with acute exacerbation ([place]) 01/14/2021   FRACTURE, TIBIAL PLATEAU 05/11/2010   Qualifier: Diagnosis of  By: Aline Brochure MD, [place]     Lung cancer, primary, with metastasis from lung to other site [facility]) 01/16/2018   Metastatic to the liver   Nuclear sclerotic cataract of both eyes 06/08/2018   Osteopenia 05/15/2013   Osteoporosis    osteopenia   Tobacco abuse 12/14/2016   Past Surgical History:  Procedure Laterality Date   FRACTURE SURGERY     torn ACL   KNEE ARTHROSCOPY     Social History   Socioeconomic History   Marital status: Married    Spouse name: [name]   Number of children: 1   Years of education: 15   Highest education level: Not on file  Occupational History   Occupation: Air traffic controller    Comment: retired  Tobacco Use   Smoking status: Every Day    Packs/day: 0.50    Years: 20.00    Pack years: 10.00    Types: Cigarettes    Start date: 09/28/1963   Smokeless tobacco: Never  Vaping Use   Vaping Use: Never used  Substance and Sexual Activity   Alcohol use: Yes  Alcohol/week: 20.0 standard drinks    Types: 20 Cans of beer per week    Comment: 3-4 beers a day   Drug use: No   Sexual activity: Yes    Birth control/protection: Post-menopausal  Other Topics Concern   Not on file  Social History Narrative   Retired Air traffic controller   Lives with husband [name]   Daughter Marcelino Scot is in [place] to walk   Social Determinants of Radio broadcast assistant Strain: Low Risk    Difficulty of Paying Living Expenses: Not hard at all  Food Insecurity: No Food Insecurity   Worried About Charity fundraiser in the Last Year: Never true   Arboriculturist in the Last Year: Never true  Transportation Needs: No  Transportation Needs   Lack of Transportation (Medical): No   Lack of Transportation (Non-Medical): No  Physical Activity: Insufficiently Active   Days of Exercise per Week: 3 days   Minutes of Exercise per Session: 30 min  Stress: No Stress Concern Present   Feeling of Stress : Not at all  Social Connections: Moderately Isolated   Frequency of Communication with Friends and Family: More than three times a week   Frequency of Social Gatherings with Friends and Family: More than three times a week   Attends Religious Services: Never   Marine scientist or Organizations: No   Attends Music therapist: Never   Marital Status: Married   Family History  Problem Relation Age of Onset   Cancer Father        died in 29's everywhere   Heart disease Mother    Cancer Mother        lung   Stroke Mother    Asthma Daughter    No Known Allergies Prior to Admission medications   Medication Sig Start Date End Date Taking? Authorizing Provider  sodium chloride 1 g tablet Take by mouth. 11/30/21  Yes [provider]  amiodarone (PACERONE) 200 MG tablet Take 200 mg by mouth daily. 02/06/21   [provider]  apixaban (ELIQUIS) 5 MG TABS tablet Take 5 mg by mouth 2 (two) times daily. 09/27/20   [provider]  furosemide (LASIX) 20 MG tablet Take 1 tablet (20 mg total) by mouth daily. 01/15/21   Orson Eva, MD  LORazepam (ATIVAN) 1 MG tablet Take 1 mg by mouth at bedtime.    [provider]  memantine (NAMENDA) 10 MG tablet Take 10 mg by mouth every morning. 02/05/21   [provider]  metoprolol (TOPROL-XL) 200 MG 24 hr tablet Take 200 mg by mouth daily. 01/02/21   [provider]  Multiple Vitamins-Minerals (MULTIVITAMIN WITH MINERALS) tablet Take 1 tablet by mouth daily.    [provider]  potassium chloride (KLOR-CON) 10 MEQ tablet Take 10 mEq by mouth 2 (two) times daily. 10/05/21   [provider]  potassium  chloride SA (K-DUR,KLOR-CON) 20 MEQ tablet Take 20 mEq by mouth daily.    [provider]  sodium chloride 1 g tablet Take 1 tablet (1 g total) by mouth 2 (two) times daily with a meal. 03/06/21   Barton Dubois, MD  traZODone (DESYREL) 50 MG tablet Take 50 mg by mouth at bedtime. 02/12/22   [provider]   CT Head Wo Contrast  Result Date: 02/27/2022 CLINICAL DATA:  Worsening weakness and increased falls. EXAM: CT HEAD WITHOUT CONTRAST TECHNIQUE: Contiguous axial images were obtained from the  base of the skull through the vertex without intravenous contrast. RADIATION DOSE REDUCTION: This exam was performed according to the departmental dose-optimization program which includes automated exposure control, adjustment of the mA and/or kV according to patient size and/or use of iterative reconstruction technique. COMPARISON:  None Available. FINDINGS: Brain: There is mild cerebral atrophy with widening of the extra-axial spaces and ventricular dilatation. There are areas of decreased attenuation within the white matter tracts of the supratentorial brain, consistent with microvascular disease changes. Vascular: No hyperdense vessel or unexpected calcification. Skull: Normal. Negative for fracture or focal lesion. Sinuses/Orbits: No acute finding. Other: None. IMPRESSION: 1. No acute intracranial abnormality. 2. Generalized cerebral atrophy with chronic white matter small vessel ischemic changes. Electronically Signed   By: Virgina Norfolk M.D.   On: 02/27/2022 04:08   CT Shoulder Right Wo Contrast  Result Date: 02/27/2022 CLINICAL DATA:  Shoulder trauma, fracture of humerus or scapula EXAM: CT OF THE UPPER RIGHT EXTREMITY WITHOUT CONTRAST TECHNIQUE: Multidetector CT imaging of the upper right extremity was performed according to the standard protocol. RADIATION DOSE REDUCTION: This exam was performed according to the departmental dose-optimization program which includes automated exposure  control, adjustment of the mA and/or kV according to patient size and/or use of iterative reconstruction technique. COMPARISON:  X-ray 02/27/2022, CT 08/05/2021 FINDINGS: Bones/Joint/Cartilage Acute heavily comminuted fracture of the right humeral head and neck. Transversely oriented component through the surgical neck which is anteromedially displaced relative to the humeral head component. The proximal humeral shaft is perched upon the anteroinferior glenoid rim (series 4, image 36). Humeral head fragment is inferiorly subluxed relative to the glenoid but not dislocated. Large 3.0 x 1.8 x 3.0 cm fracture fragment involving the greater tuberosity is posteromedially displaced relative to the humeral head within the joint space. Numerous small comminuted fracture fragments surround the humeral head and neck. Bones appear demineralized and slightly heterogeneous and pathologic fracture of an underlying bone lesion is not excluded, particularly given history of cancer. Remaining osseous structures appear otherwise intact. AC joint within normal limits. Ligaments Suboptimally assessed by CT. Muscles and Tendons Presumed osseous avulsion of the rotator cuff tendons with displaced greater tuberosity fracture. Enlargement of the rotator cuff musculature likely related to intramuscular hematoma formation. Soft tissues Prominent soft tissue swelling about the shoulder and proximal upper arm. Emphysematous changes within the right lung. 6 mm subpleural nodule at the right lung apex is stable from 08/05/2021. Right chest port is in place. IMPRESSION: Acute heavily comminuted and displaced fracture of the right humeral head and neck, as described above. Bones appear demineralized and slightly heterogeneous and pathologic fracture of an underlying bone lesion is not excluded, particularly given history of cancer. Electronically Signed   By: Davina Poke D.O.   On: 02/27/2022 09:11   DG Humerus Right  Result Date:  02/27/2022 CLINICAL DATA:  Increased falls. EXAM: RIGHT HUMERUS - 2+ VIEW COMPARISON:  None Available. FINDINGS: Acute, comminuted fracture deformity is seen involving the head and surgical neck of the proximal right humerus. Approximately 1 shaft width medial displacement of the distal fracture site is seen. Dislocation of the right humeral head cannot be excluded. Diffuse soft tissue swelling is noted. IMPRESSION: Acute, displaced fracture deformity involving the head and surgical neck of the proximal right humerus. Electronically Signed   By: Virgina Norfolk M.D.   On: 02/27/2022 02:46   US Abdomen Limited RUQ (LIVER/GB)  Result Date: 02/27/2022 CLINICAL DATA:  Elevated LFTs for 6 months. EXAM: ULTRASOUND ABDOMEN LIMITED RIGHT  UPPER QUADRANT COMPARISON:  None Available. FINDINGS: Gallbladder: No gallstones or wall thickening visualized. No sonographic Murphy sign noted by sonographer. Common bile duct: Diameter: 4.5 mm Liver: Diffuse increased echogenicity. No focal masses. Portal vein is patent on color Doppler imaging with normal direction of blood flow towards the liver. Other: None. IMPRESSION: 1. Diffuse increased echogenicity throughout the liver is nonspecific but often due to hepatic steatosis. No other abnormalities are identified. Electronically Signed   By: Dorise Bullion III M.D.   On: 02/27/2022 10:56    Family History Reviewed and non-contributory, no pertinent history of problems with bleeding or anesthesia    Review of Systems No fevers or chills No numbness or tingling No chest pain No shortness of breath No GI distress No headaches No nausea or vomiting No hallucinations.     OBJECTIVE  Vitals:Patient Vitals for the past 8 hrs:  BP Temp Temp src Pulse Resp SpO2  02/27/22 1310 113/61 98.4 F (36.9 C) Oral 75 18 98 %   General: Alert, no acute distress Cardiovascular: Warm extremities noted Respiratory: No cyanosis, no use of accessory musculature GI: No  organomegaly, abdomen is soft and non-tender Skin: No lesions in the area of chief complaint other than those listed below in MSK exam.  Neurologic: Sensation intact distally save for the below mentioned MSK exam Psychiatric: Patient is a little confused.  She has trouble verbalizing her thoughts.  Lymphatic: No swelling obvious and reported other than the area involved in the exam below Extremities  RUE:In a sling.  Swelling about the shoulder.  ROM deferred. Pain with minimal movement.  Fingers are warm and well perfused.   2+ radial pulse.  Active motion intact in right hand.  Sensation intact in the axillary nerve distribution.     Test Results Imaging   XR and CT scan of the right shoulder demonstrates a severely comminuted, valgus impacted proximal humerus fracture.  Articular portion has been displaced posterior and superior.  Labs cbc Recent Labs    02/27/22 0318  WBC 11.0*  HGB 10.2*  HCT 27.7*  PLT 222    Labs inflam No results for input(s): CRP in the last 72 hours.  Invalid input(s): ESR  Labs coag No results for input(s): INR, PTT in the last 72 hours.  Invalid input(s): PT  Recent Labs    02/27/22 0318  NA 123*  K 3.2*  CL 93*  CO2 23  GLUCOSE 148*  BUN 13  CREATININE 0.56  CALCIUM 9.7

## 2022-02-27 NOTE — Assessment & Plan Note (Addendum)
Currently in sinus rhythm Holding amiodarone secondary to elevated LFTs Holding apixaban in preparation for orthopedic intervention initially and now with concerns of possible hepatic failure Restart metoprolol succinate at lower dose

## 2022-02-27 NOTE — ED Notes (Signed)
Patient transported to CT 

## 2022-02-27 NOTE — Assessment & Plan Note (Addendum)
Orthopedics consulted --remain in sling for now --Dr. Amedeo Kinsman saw patient --pt and spouse opted for nonoperative management --continue judicious opioids

## 2022-02-27 NOTE — Progress Notes (Addendum)
Frequency: bladder scan revealed 200cc. [place]ould smelling , tea colored and cloudy urine

## 2022-02-27 NOTE — Assessment & Plan Note (Signed)
Tobacco cessation discussed 

## 2022-02-27 NOTE — Hospital Course (Signed)
66 year old female with a history of metastatic squamous cell carcinoma of the lung to the liver, paroxysmal atrial fibrillation, diastolic CHF, hypertension, chronic to impairment, hyponatremia, COPD, alcohol and tobacco abuse presenting with generalized weakness and falls.  Apparently, the patient had a mechanical fall on the evening of 02/26/2022.  She stated that she slipped on a rug and fell onto her right side.  She denied hitting her head or loss of consciousness.  She denies any new medications.  However, she states that she continues to drink alcohol on a daily basis.  She states that she drinks 2 beers and 3 mixed drinks on a daily basis.  She continues to smoke 1 pack/day. Patient denies fevers, chills, headache, chest pain, dyspnea, nausea, vomiting, diarrhea, abdominal pain, dysuria, hematuria, hematochezia, and melena. In the ED, the patient was afebrile and hemodynamically stable with oxygen saturation 96% on room air.  BMP showed a sodium 123, potassium 3.2, bicarbonate 23, serum creatinine 0.56.  AST 293, ALT to 177, total bilirubin 2.5, magnesium 1.3.  WBC 11.0, hemoglobin 10.3, platelets 222,000.  EKG shows sinus rhythm with nonspecific T wave changes.  CT of the brain showed generalized atrophy.  X-ray of the right shoulder showed an acute displaced fracture of the right humerus head and surgical neck.  Orthopedics was consulted.

## 2022-02-28 DIAGNOSIS — I5032 Chronic diastolic (congestive) heart failure: Secondary | ICD-10-CM

## 2022-02-28 DIAGNOSIS — S42211A Unspecified displaced fracture of surgical neck of right humerus, initial encounter for closed fracture: Secondary | ICD-10-CM | POA: Diagnosis not present

## 2022-02-28 DIAGNOSIS — Z79899 Other long term (current) drug therapy: Secondary | ICD-10-CM

## 2022-02-28 DIAGNOSIS — E876 Hypokalemia: Secondary | ICD-10-CM | POA: Diagnosis not present

## 2022-02-28 DIAGNOSIS — R748 Abnormal levels of other serum enzymes: Secondary | ICD-10-CM

## 2022-02-28 DIAGNOSIS — K7010 Alcoholic hepatitis without ascites: Secondary | ICD-10-CM

## 2022-02-28 DIAGNOSIS — F1010 Alcohol abuse, uncomplicated: Secondary | ICD-10-CM

## 2022-02-28 LAB — CBC
HCT: 27.9 % — ABNORMAL LOW (ref 36.0–46.0)
Hemoglobin: 9.6 g/dL — ABNORMAL LOW (ref 12.0–15.0)
MCH: 35.2 pg — ABNORMAL HIGH (ref 26.0–34.0)
MCHC: 34.4 g/dL (ref 30.0–36.0)
MCV: 102.2 fL — ABNORMAL HIGH (ref 80.0–100.0)
Platelets: 212 10*3/uL (ref 150–400)
RBC: 2.73 MIL/uL — ABNORMAL LOW (ref 3.87–5.11)
RDW: 13.2 % (ref 11.5–15.5)
WBC: 9.4 10*3/uL (ref 4.0–10.5)
nRBC: 0 % (ref 0.0–0.2)

## 2022-02-28 LAB — PROTIME-INR
INR: 1.1 (ref 0.8–1.2)
Prothrombin Time: 13.9 seconds (ref 11.4–15.2)

## 2022-02-28 LAB — IRON AND TIBC
Iron: 23 ug/dL — ABNORMAL LOW (ref 28–170)
Saturation Ratios: 8 % — ABNORMAL LOW (ref 10.4–31.8)
TIBC: 281 ug/dL (ref 250–450)
UIBC: 258 ug/dL

## 2022-02-28 LAB — FERRITIN: Ferritin: 1428 ng/mL — ABNORMAL HIGH (ref 11–307)

## 2022-02-28 LAB — MAGNESIUM: Magnesium: 1.9 mg/dL (ref 1.7–2.4)

## 2022-02-28 NOTE — Progress Notes (Signed)
   ORTHOPAEDIC PROGRESS NOTE  Right comminuted proximal humerus fracture  SUBJECTIVE: Continues to have pain in the right shoulder.  Difficult to get comfortable.  No numbness or tingling.  The sling is shifted.  Husband at bedside.  Patient leaning towards nonoperative management.   OBJECTIVE: PE:  Alert and oriented.  Some confusion.   Right arm in a sling.  Arm held at side, elbow at 90 degrees Bruising over anterior shoulder, on to chest Swelling in upper arm, no swelling into hand Sensation intact in the axillary nerve distribution Sensation intact throughout the right hand 2+ radial pulse.   Active motion intact in the AIN/PIN/U nerve distribution  Vitals:   02/27/22 2021 02/28/22 0311  BP: (!) 117/58 (!) 145/55  Pulse: 80 79  Resp: 16 18  Temp: 98.4 F (36.9 C) 98.8 F (37.1 C)  SpO2: 96% 99%      Latest Ref Rng & Units 02/28/2022    5:18 AM 02/27/2022    3:18 AM 03/04/2021    4:19 AM  CBC  WBC 4.0 - 10.5 K/uL 9.4   11.0   31.2    Hemoglobin 12.0 - 15.0 g/dL 9.6   10.2   8.3    Hematocrit 36.0 - 46.0 % 27.9   27.7   23.4    Platelets 150 - 400 K/uL 212   222   75       ASSESSMENT: Cynthia Kelley is a 66 y.o. female with comminuted right proximal humerus fracture.  Stable.  Pain.  PLAN: I had a long thorough discussion with the patient and her husband in the hospital today.  She has a severely comminuted, valgus impacted right proximal humerus fracture.  This is not amenable to operative fixation.  Sling.  This was discussed with the patient.  At this point, she is leaning this way.  However, if she consider surgery, I would recommend a reverse shoulder arthroplasty.  The procedure and expected outcomes were discussed.  Risks were discussed in great detail.  Unfortunately, because of her extensive medical history, I do not think that it would be appropriate to proceed with surgery at any can.  As a result, if she wishes to proceed with surgery, we will have to refer her.   At this point, they would like to meet with another surgeon, who could potentially proceed with surgery.  I will plan to send a referral to Dr. Marlou Sa, at [facility], in order for him to be able to discuss both nonoperative and operative management of her right shoulder injury.  All questions were answered, she is amenable to this plan.  She does not need to remain in the hospital from an orthopedic perspective.  I can arrange follow-up for her as an outpatient.   Contact information:     Lyzbeth Genrich A. Amedeo Kinsman, MD [facility] [address] [place],  [place]  [address] Phone: [phone] Fax: [phone]

## 2022-02-28 NOTE — Plan of Care (Signed)

## 2022-02-28 NOTE — Progress Notes (Signed)
PROGRESS NOTE  Cynthia Kelley OXB:[id number] DOB: [birth year] DOA: 02/27/2022 PCP: Lindell Spar, MD  Brief History:  66 year old female with a history of metastatic squamous cell carcinoma of the lung to the liver, paroxysmal atrial fibrillation, diastolic CHF, hypertension, chronic to impairment, hyponatremia, COPD, alcohol and tobacco abuse presenting with generalized weakness and falls.  Apparently, the patient had a mechanical fall on the evening of 02/26/2022.  She stated that she slipped on a rug and fell onto her right side.  She denied hitting her head or loss of consciousness.  She denies any new medications.  However, she states that she continues to drink alcohol on a daily basis.  She states that she drinks 2 beers and 3 mixed drinks on a daily basis.  She continues to smoke 1 pack/day. Patient denies fevers, chills, headache, chest pain, dyspnea, nausea, vomiting, diarrhea, abdominal pain, dysuria, hematuria, hematochezia, and melena. In the ED, the patient was afebrile and hemodynamically stable with oxygen saturation 96% on room air.  BMP showed a sodium 123, potassium 3.2, bicarbonate 23, serum creatinine 0.56.  AST 293, ALT to 177, total bilirubin 2.5, magnesium 1.3.  WBC 11.0, hemoglobin 10.3, platelets 222,000.  EKG shows sinus rhythm with nonspecific T wave changes.  CT of the brain showed generalized atrophy.  X-ray of the right shoulder showed an acute displaced fracture of the right humerus head and surgical neck.  Orthopedics was consulted.    Assessment and Plan: * Humeral surgical neck fracture Orthopedics consulted --remain in sling for now --Dr. Amedeo Kinsman saw patient --pt and spouse opted for nonoperative management  Chronic alcohol abuse No signs of withdrawal  Paroxysmal atrial fibrillation (HCC) Currently in sinus rhythm Holding amiodarone secondary to elevated LFTs\ Holding apixaban in preparation for orthopedic intervention Restart metoprolol succinate at  lower dose (IV lopressor until allowed to take po)   Hypomagnesemia Replete  Dementia without behavioral disturbance (HCC) Continue Namenda  Transaminitis RUQ ultrasound--hepatic steatosis Hold amiodarone LFTs continue to trend up GI consult  Fall at home, initial encounter PT eval>>outpt PT  Hypokalemia Replete  Hyponatremia This has been chronic and multifactorial -Including chronic alcohol use, poor solute intake, SIADH, CHF -Continue home dose sodium chloride tablets, 1 g twice daily -Baseline sodium 125-130 Presented with sodium 123 Urine osmolarity--565 Serum osmolarity--261 FeNa 0.1% Na back to baseline after isotonic saline  Anxiety PDMP reviewed -Last prescription for Ativan 1 mg, #30 on 10/06/2021 Start alcohol withdrawal protocol  Chronic diastolic CHF (congestive heart failure) ([place]) Appears clinically euvolemic Daily weights Hold home dose furosemide temporarily  Lung cancer, primary, with metastasis from lung to other site [facility] [address]) Last follow-up 02/02/2022 at [facility] -- Status post chemoradiation -- 02/02/2022 CT abdomen pelvis and chest did not show any new metastasis; there was stable pulmonary nodules -- Currently under ongoing surveillance with instructions to follow-up in 3 months  Tobacco abuse Tobacco cessation discussed      Family Communication:  spouse updated 6/4 Consultants:  ortho   Code Status:  DNR   DVT Prophylaxis:  SCDs     Procedures: As Listed in Progress Note Above   Antibiotics: None        Subjective: Complains of right shoulder pain.  Patient denies fevers, chills, headache, chest pain, dyspnea, nausea, vomiting, diarrhea, abdominal pain, dysuria, hematuria, hematochezia, and melena.   Objective: Vitals:   02/27/22 1958 02/27/22 2021 02/28/22 0311 02/28/22 1320  BP:  (!) 117/58 (!) 145/55 (!) 98/46  Pulse:  80 79 82  Resp:  16 18 17   Temp:  98.4 F (36.9 C) 98.8 F (37.1 C) 98.7 F (37.1 C)  TempSrc:   Oral Oral Oral  SpO2:  96% 99% 98%  Weight:      Height: 5\' 6"  (1.676 m)       Intake/Output Summary (Last 24 hours) at 02/28/2022 1327 Last data filed at 02/28/2022 1239 Gross per 24 hour  Intake 2190.48 ml  Output 525 ml  Net 1665.48 ml   Weight change:  Exam:  General:  Pt is alert, follows commands appropriately, not in acute distress HEENT: No icterus, No thrush, No neck mass, [place]/AT Cardiovascular: RRR, S1/S2, no rubs, no gallops Respiratory: bibasilar rales. No wheeze Abdomen: Soft/+BS, non tender, non distended, no guarding Extremities: No edema, No lymphangitis, No petechiae, No rashes, no synovitis   Data Reviewed: I have personally reviewed following labs and imaging studies Basic Metabolic Panel: Recent Labs  Lab 02/27/22 0318 02/28/22 0518  NA 123* 130*  K 3.2* 3.7  CL 93* 101  CO2 23 22  GLUCOSE 148* 92  BUN 13 14  CREATININE 0.56 0.52  CALCIUM 9.7 8.9  MG 1.3* 1.9  PHOS 4.3  --    Liver Function Tests: Recent Labs  Lab 02/27/22 0318 02/28/22 0518  AST 293* 507*  ALT 177* 376*  ALKPHOS 49 48  BILITOT 2.5* 2.1*  PROT 6.2* 5.9*  ALBUMIN 3.9 3.7   No results for input(s): LIPASE, AMYLASE in the last 168 hours. No results for input(s): AMMONIA in the last 168 hours. Coagulation Profile: No results for input(s): INR, PROTIME in the last 168 hours. CBC: Recent Labs  Lab 02/27/22 0318 02/28/22 0518  WBC 11.0* 9.4  NEUTROABS 9.7*  --   HGB 10.2* 9.6*  HCT 27.7* 27.9*  MCV 97.5 102.2*  PLT 222 212   Cardiac Enzymes: No results for input(s): CKTOTAL, CKMB, CKMBINDEX, TROPONINI in the last 168 hours. BNP: Invalid input(s): POCBNP CBG: No results for input(s): GLUCAP in the last 168 hours. HbA1C: No results for input(s): HGBA1C in the last 72 hours. Urine analysis:    Component Value Date/Time   COLORURINE YELLOW 02/27/2022 0823   APPEARANCEUR CLEAR 02/27/2022 0823   LABSPEC 1.029 02/27/2022 0823   PHURINE 6.0 02/27/2022 0823    GLUCOSEU NEGATIVE 02/27/2022 0823   HGBUR NEGATIVE 02/27/2022 0823   BILIRUBINUR NEGATIVE 02/27/2022 0823   BILIRUBINUR small 09/15/2017 1210   KETONESUR NEGATIVE 02/27/2022 0823   PROTEINUR NEGATIVE 02/27/2022 0823   UROBILINOGEN 0.2 09/15/2017 1210   NITRITE NEGATIVE 02/27/2022 0823   LEUKOCYTESUR NEGATIVE 02/27/2022 0823   Sepsis Labs: @LABRCNTIP (procalcitonin:4,lacticidven:4) )No results found for this or any previous visit (from the past 240 hour(s)).   Scheduled Meds:  Chlorhexidine Gluconate Cloth  6 each Topical Daily   memantine  10 mg Oral q morning   metoprolol tartrate  2.5 mg Intravenous Q6H   pneumococcal 20-valent conjugate vaccine  0.5 mL Intramuscular Tomorrow-1000   sodium chloride  1 g Oral BID WC   traZODone  50 mg Oral QHS   Continuous Infusions:  Procedures/Studies: CT Head Wo Contrast  Result Date: 02/27/2022 CLINICAL DATA:  Worsening weakness and increased falls. EXAM: CT HEAD WITHOUT CONTRAST TECHNIQUE: Contiguous axial images were obtained from the base of the skull through the vertex without intravenous contrast. RADIATION DOSE REDUCTION: This exam was performed according to the departmental dose-optimization program which includes automated exposure control, adjustment of the mA and/or kV according to  patient size and/or use of iterative reconstruction technique. COMPARISON:  None Available. FINDINGS: Brain: There is mild cerebral atrophy with widening of the extra-axial spaces and ventricular dilatation. There are areas of decreased attenuation within the white matter tracts of the supratentorial brain, consistent with microvascular disease changes. Vascular: No hyperdense vessel or unexpected calcification. Skull: Normal. Negative for fracture or focal lesion. Sinuses/Orbits: No acute finding. Other: None. IMPRESSION: 1. No acute intracranial abnormality. 2. Generalized cerebral atrophy with chronic white matter small vessel ischemic changes. Electronically  Signed   By: Virgina Norfolk M.D.   On: 02/27/2022 04:08   CT Shoulder Right Wo Contrast  Result Date: 02/27/2022 CLINICAL DATA:  Shoulder trauma, fracture of humerus or scapula EXAM: CT OF THE UPPER RIGHT EXTREMITY WITHOUT CONTRAST TECHNIQUE: Multidetector CT imaging of the upper right extremity was performed according to the standard protocol. RADIATION DOSE REDUCTION: This exam was performed according to the departmental dose-optimization program which includes automated exposure control, adjustment of the mA and/or kV according to patient size and/or use of iterative reconstruction technique. COMPARISON:  X-ray 02/27/2022, CT 08/05/2021 FINDINGS: Bones/Joint/Cartilage Acute heavily comminuted fracture of the right humeral head and neck. Transversely oriented component through the surgical neck which is anteromedially displaced relative to the humeral head component. The proximal humeral shaft is perched upon the anteroinferior glenoid rim (series 4, image 36). Humeral head fragment is inferiorly subluxed relative to the glenoid but not dislocated. Large 3.0 x 1.8 x 3.0 cm fracture fragment involving the greater tuberosity is posteromedially displaced relative to the humeral head within the joint space. Numerous small comminuted fracture fragments surround the humeral head and neck. Bones appear demineralized and slightly heterogeneous and pathologic fracture of an underlying bone lesion is not excluded, particularly given history of cancer. Remaining osseous structures appear otherwise intact. AC joint within normal limits. Ligaments Suboptimally assessed by CT. Muscles and Tendons Presumed osseous avulsion of the rotator cuff tendons with displaced greater tuberosity fracture. Enlargement of the rotator cuff musculature likely related to intramuscular hematoma formation. Soft tissues Prominent soft tissue swelling about the shoulder and proximal upper arm. Emphysematous changes within the right lung. 6 mm  subpleural nodule at the right lung apex is stable from 08/05/2021. Right chest port is in place. IMPRESSION: Acute heavily comminuted and displaced fracture of the right humeral head and neck, as described above. Bones appear demineralized and slightly heterogeneous and pathologic fracture of an underlying bone lesion is not excluded, particularly given history of cancer. Electronically Signed   By: Davina Poke D.O.   On: 02/27/2022 09:11   DG Humerus Right  Result Date: 02/27/2022 CLINICAL DATA:  Increased falls. EXAM: RIGHT HUMERUS - 2+ VIEW COMPARISON:  None Available. FINDINGS: Acute, comminuted fracture deformity is seen involving the head and surgical neck of the proximal right humerus. Approximately 1 shaft width medial displacement of the distal fracture site is seen. Dislocation of the right humeral head cannot be excluded. Diffuse soft tissue swelling is noted. IMPRESSION: Acute, displaced fracture deformity involving the head and surgical neck of the proximal right humerus. Electronically Signed   By: Virgina Norfolk M.D.   On: 02/27/2022 02:46   US Abdomen Limited RUQ (LIVER/GB)  Result Date: 02/27/2022 CLINICAL DATA:  Elevated LFTs for 6 months. EXAM: ULTRASOUND ABDOMEN LIMITED RIGHT UPPER QUADRANT COMPARISON:  None Available. FINDINGS: Gallbladder: No gallstones or wall thickening visualized. No sonographic Murphy sign noted by sonographer. Common bile duct: Diameter: 4.5 mm Liver: Diffuse increased echogenicity. No focal masses. Portal vein  is patent on color Doppler imaging with normal direction of blood flow towards the liver. Other: None. IMPRESSION: 1. Diffuse increased echogenicity throughout the liver is nonspecific but often due to hepatic steatosis. No other abnormalities are identified. Electronically Signed   By: Dorise Bullion III M.D.   On: 02/27/2022 10:56    Orson Eva, DO  [facility]  If 7PM-7AM, please contact night-coverage [email or web address] Password  [email or web address] 02/28/2022, 1:27 PM   LOS: 1 day

## 2022-02-28 NOTE — Consult Note (Signed)
Consulting  Provider: Dr. Carles Collet Primary Care Physician:  Lindell Spar, MD Primary Gastroenterologist: Previously unassigned, Dr. Abbey Chatters  Reason for Consultation: Hepatitis  HPI:  Cynthia Kelley is a 66 y.o. female with a past medical history of metastatic squamous cell carcinoma of the lung with liver mets and bone, paroxysmal atrial fibrillation chronically on Eliquis, diastolic heart failure, hypertension, hyponatremia, alcohol and tobacco abuse, who presented to [facility] ER yesterday after after suffering mechanical fall at home.  Subsequent work-up included severe surgical neck fracture of her right humerus.  Ortho following, patient has elected for conservative treatment.  On initial work-up, she was found to have abnormal LFTs.  Initially AST 293, ALT 177, T. bili 2.5, normal alk phos. today these have worsened to AST 507, ALT 376, T. bili 2.1.  No coags.  Right upper quadrant ultrasound showed hepatic steatosis otherwise unremarkable.  Patient denies any family history of liver disease.  She does drink alcohol regularly.  States on average she drinks 3 beers and 2-3 mixed drinks every day.  Her husband states that they used to drink more but that patient has cut down to this amount.  Patient also notes taking Tylenol daily for chronic left knee pain.  Also chronically on amiodarone for her atrial fibrillation x3 years.  Denies any encephalopathy, jaundice, abdominal distention, lower extremity edema.  No new medications that she is aware of.  No recent antibiotic use.  No herbal supplements.  Appears her last chemotherapy treatment was June 2022.  Past Medical History:  Diagnosis Date   Acute diastolic congestive heart failure, NYHA class 2 ([place]) 11/11/2020   Anemia 11/22/2017   Anxiety 11/19/2020   Arthritis    Asymptomatic PVD (peripheral vascular disease) ([place]) 12/14/2016   Suspected.  Hands hyperemic with decreased cap refill. ? History vasospasm   COPD with acute exacerbation  ([place]) 01/14/2021   FRACTURE, TIBIAL PLATEAU 05/11/2010   Qualifier: Diagnosis of  By: Aline Brochure MD, [place]     Lung cancer, primary, with metastasis from lung to other site [facility]) 01/16/2018   Metastatic to the liver   Nuclear sclerotic cataract of both eyes 06/08/2018   Osteopenia 05/15/2013   Osteoporosis    osteopenia   Tobacco abuse 12/14/2016    Past Surgical History:  Procedure Laterality Date   FRACTURE SURGERY     torn ACL   KNEE ARTHROSCOPY      Prior to Admission medications   Medication Sig Start Date End Date Taking? Authorizing Provider  amiodarone (PACERONE) 200 MG tablet Take 200 mg by mouth daily. 02/06/21  Yes [provider]  apixaban (ELIQUIS) 5 MG TABS tablet Take 5 mg by mouth 2 (two) times daily. 09/27/20  Yes [provider]  furosemide (LASIX) 20 MG tablet Take 1 tablet (20 mg total) by mouth daily. 01/15/21  Yes Tat, Shanon Brow, MD  LORazepam (ATIVAN) 1 MG tablet Take 1 mg by mouth at bedtime.   Yes [provider]  memantine (NAMENDA) 10 MG tablet Take 10 mg by mouth every morning. 02/05/21  Yes [provider]  metoprolol (TOPROL-XL) 200 MG 24 hr tablet Take 200 mg by mouth daily. 01/02/21  Yes [provider]  Multiple Vitamins-Minerals (MULTIVITAMIN WITH MINERALS) tablet Take 1 tablet by mouth daily.   Yes [provider]  potassium chloride (KLOR-CON) 10 MEQ tablet Take 10 mEq by mouth 2 (two) times daily. 10/05/21  Yes [provider]  sodium chloride 1 g tablet Take 1 tablet (1 g total) by  mouth 2 (two) times daily with a meal. 03/06/21  Yes Barton Dubois, MD  traZODone (DESYREL) 50 MG tablet Take 50 mg by mouth at bedtime. 02/12/22  Yes [provider]  potassium chloride SA (K-DUR,KLOR-CON) 20 MEQ tablet Take 20 mEq by mouth daily.    [provider]    Current Facility-Administered Medications  Medication Dose Route Frequency Provider Last Rate Last Admin   Chlorhexidine Gluconate  Cloth 2 % PADS 6 each  6 each Topical Daily Tat, David, MD   6 each at 02/27/22 1957   memantine (NAMENDA) tablet 10 mg  10 mg Oral q morning Adefeso, Oladapo, DO   10 mg at 02/28/22 1021   metoprolol tartrate (LOPRESSOR) injection 2.5 mg  2.5 mg Intravenous Nicholes Calamity, MD   2.5 mg at 02/28/22 0530   oxyCODONE (Oxy IR/ROXICODONE) immediate release tablet 5 mg  5 mg Oral Q4H PRN Adefeso, Oladapo, DO   5 mg at 02/28/22 8756   pneumococcal 20-valent conjugate vaccine (PREVNAR 20) injection 0.5 mL  0.5 mL Intramuscular Tomorrow-1000 Adefeso, Oladapo, DO       prochlorperazine (COMPAZINE) injection 5 mg  5 mg Intravenous Q6H PRN Tat, David, MD   5 mg at 02/27/22 1737   sodium chloride tablet 1 g  1 g Oral BID WC Adefeso, Oladapo, DO   1 g at 02/28/22 0803   traZODone (DESYREL) tablet 50 mg  50 mg Oral QHS Adefeso, Oladapo, DO   50 mg at 02/27/22 2201    Allergies as of 02/27/2022   (No Known Allergies)    Family History  Problem Relation Age of Onset   Cancer Father        died in 72's everywhere   Heart disease Mother    Cancer Mother        lung   Stroke Mother    Asthma Daughter     Social History   Socioeconomic History   Marital status: Married    Spouse name: [name]   Number of children: 1   Years of education: 15   Highest education level: Not on file  Occupational History   Occupation: Air traffic controller    Comment: retired  Tobacco Use   Smoking status: Every Day    Packs/day: 0.50    Years: 20.00    Pack years: 10.00    Types: Cigarettes    Start date: 09/28/1963   Smokeless tobacco: Never  Vaping Use   Vaping Use: Never used  Substance and Sexual Activity   Alcohol use: Yes    Alcohol/week: 20.0 standard drinks    Types: 20 Cans of beer per week    Comment: 3-4 beers a day   Drug use: No   Sexual activity: Yes    Birth control/protection: Post-menopausal  Other Topics Concern   Not on file  Social History Narrative   Retired Air traffic controller   Lives with husband  [name]   Daughter Marcelino Scot is in [place] to walk   Social Determinants of Radio broadcast assistant Strain: Low Risk    Difficulty of Paying Living Expenses: Not hard at all  Food Insecurity: No Food Insecurity   Worried About Charity fundraiser in the Last Year: Never true   Arboriculturist in the Last Year: Never true  Transportation Needs: No Transportation Needs   Lack of Transportation (Medical): No   Lack of Transportation (Non-Medical): No  Physical Activity: Insufficiently Active   Days  of Exercise per Week: 3 days   Minutes of Exercise per Session: 30 min  Stress: No Stress Concern Present   Feeling of Stress : Not at all  Social Connections: Moderately Isolated   Frequency of Communication with Friends and Family: More than three times a week   Frequency of Social Gatherings with Friends and Family: More than three times a week   Attends Religious Services: Never   Marine scientist or Organizations: No   Attends Music therapist: Never   Marital Status: Married  Human resources officer Violence: Not At Risk   Fear of Current or Ex-Partner: No   Emotionally Abused: No   Physically Abused: No   Sexually Abused: No    Review of Systems: General: Negative for anorexia, weight loss, fever, chills, fatigue, weakness. Eyes: Negative for vision changes.  ENT: Negative for hoarseness, difficulty swallowing , nasal congestion. CV: Negative for chest pain, angina, palpitations, dyspnea on exertion, peripheral edema.  Respiratory: Negative for dyspnea at rest, dyspnea on exertion, cough, sputum, wheezing.  GI: See history of present illness. GU:  Negative for dysuria, hematuria, urinary incontinence, urinary frequency, nocturnal urination.  MS: Negative for joint pain, low back pain.  Derm: Negative for rash or itching.  Neuro: Negative for weakness, abnormal sensation, seizure, frequent headaches, memory loss, confusion.  Psych: Negative for anxiety,  depression Endo: Negative for unusual weight change.  Heme: Negative for bruising or bleeding. Allergy: Negative for rash or hives.  Physical Exam: Vital signs in last 24 hours: Temp:  [98.4 F (36.9 C)-98.8 F (37.1 C)] 98.8 F (37.1 C) (06/04 0311) Pulse Rate:  [75-80] 79 (06/04 0311) Resp:  [16-18] 18 (06/04 0311) BP: (113-145)/(55-61) 145/55 (06/04 0311) SpO2:  [96 %-99 %] 99 % (06/04 0311) Last BM Date : 02/25/22 General:   Alert,  Well-developed, well-nourished, pleasant and cooperative in NAD Head:  Normocephalic and atraumatic. Eyes:  Sclera clear, no icterus.   Conjunctiva pink. Ears:  Normal auditory acuity. Nose:  No deformity, discharge,  or lesions. Mouth:  No deformity or lesions, dentition normal. Neck:  Supple; no masses or thyromegaly. Lungs:  Clear throughout to auscultation.   No wheezes, crackles, or rhonchi. No acute distress. Heart:  Regular rate and rhythm; no murmurs, clicks, rubs,  or gallops. Abdomen:  Soft, nontender and nondistended. No masses, hepatosplenomegaly or hernias noted. Normal bowel sounds, without guarding, and without rebound.   Msk:  Symmetrical without gross deformities. Normal posture. Pulses:  Normal pulses noted. Extremities:  Without clubbing or edema. Neurologic:  Alert and  oriented x4;  grossly normal neurologically. Skin:  Intact without significant lesions or rashes. Cervical Nodes:  No significant cervical adenopathy. Psych:  Alert and cooperative. Normal mood and affect.  Intake/Output from previous day: 06/03 0701 - 06/04 0700 In: 1164.2 [I.V.:1164.2] Out: 525 [Urine:525] Intake/Output this shift: Total I/O In: 240 [P.O.:240] Out: -   Lab Results: Recent Labs    02/27/22 0318 02/28/22 0518  WBC 11.0* 9.4  HGB 10.2* 9.6*  HCT 27.7* 27.9*  PLT 222 212   BMET Recent Labs    02/27/22 0318 02/28/22 0518  NA 123* 130*  K 3.2* 3.7  CL 93* 101  CO2 23 22  GLUCOSE 148* 92  BUN 13 14  CREATININE 0.56 0.52   CALCIUM 9.7 8.9   LFT Recent Labs    02/27/22 0318 02/28/22 0518  PROT 6.2* 5.9*  ALBUMIN 3.9 3.7  AST 293* 507*  ALT 177* 376*  ALKPHOS  49 48  BILITOT 2.5* 2.1*   PT/INR No results for input(s): LABPROT, INR in the last 72 hours. Hepatitis Panel No results for input(s): HEPBSAG, HCVAB, HEPAIGM, HEPBIGM in the last 72 hours. C-Diff No results for input(s): CDIFFTOX in the last 72 hours.  Studies/Results: CT Head Wo Contrast  Result Date: 02/27/2022 CLINICAL DATA:  Worsening weakness and increased falls. EXAM: CT HEAD WITHOUT CONTRAST TECHNIQUE: Contiguous axial images were obtained from the base of the skull through the vertex without intravenous contrast. RADIATION DOSE REDUCTION: This exam was performed according to the departmental dose-optimization program which includes automated exposure control, adjustment of the mA and/or kV according to patient size and/or use of iterative reconstruction technique. COMPARISON:  None Available. FINDINGS: Brain: There is mild cerebral atrophy with widening of the extra-axial spaces and ventricular dilatation. There are areas of decreased attenuation within the white matter tracts of the supratentorial brain, consistent with microvascular disease changes. Vascular: No hyperdense vessel or unexpected calcification. Skull: Normal. Negative for fracture or focal lesion. Sinuses/Orbits: No acute finding. Other: None. IMPRESSION: 1. No acute intracranial abnormality. 2. Generalized cerebral atrophy with chronic white matter small vessel ischemic changes. Electronically Signed   By: Virgina Norfolk M.D.   On: 02/27/2022 04:08   CT Shoulder Right Wo Contrast  Result Date: 02/27/2022 CLINICAL DATA:  Shoulder trauma, fracture of humerus or scapula EXAM: CT OF THE UPPER RIGHT EXTREMITY WITHOUT CONTRAST TECHNIQUE: Multidetector CT imaging of the upper right extremity was performed according to the standard protocol. RADIATION DOSE REDUCTION: This exam was  performed according to the departmental dose-optimization program which includes automated exposure control, adjustment of the mA and/or kV according to patient size and/or use of iterative reconstruction technique. COMPARISON:  X-ray 02/27/2022, CT 08/05/2021 FINDINGS: Bones/Joint/Cartilage Acute heavily comminuted fracture of the right humeral head and neck. Transversely oriented component through the surgical neck which is anteromedially displaced relative to the humeral head component. The proximal humeral shaft is perched upon the anteroinferior glenoid rim (series 4, image 36). Humeral head fragment is inferiorly subluxed relative to the glenoid but not dislocated. Large 3.0 x 1.8 x 3.0 cm fracture fragment involving the greater tuberosity is posteromedially displaced relative to the humeral head within the joint space. Numerous small comminuted fracture fragments surround the humeral head and neck. Bones appear demineralized and slightly heterogeneous and pathologic fracture of an underlying bone lesion is not excluded, particularly given history of cancer. Remaining osseous structures appear otherwise intact. AC joint within normal limits. Ligaments Suboptimally assessed by CT. Muscles and Tendons Presumed osseous avulsion of the rotator cuff tendons with displaced greater tuberosity fracture. Enlargement of the rotator cuff musculature likely related to intramuscular hematoma formation. Soft tissues Prominent soft tissue swelling about the shoulder and proximal upper arm. Emphysematous changes within the right lung. 6 mm subpleural nodule at the right lung apex is stable from 08/05/2021. Right chest port is in place. IMPRESSION: Acute heavily comminuted and displaced fracture of the right humeral head and neck, as described above. Bones appear demineralized and slightly heterogeneous and pathologic fracture of an underlying bone lesion is not excluded, particularly given history of cancer. Electronically  Signed   By: Davina Poke D.O.   On: 02/27/2022 09:11   DG Humerus Right  Result Date: 02/27/2022 CLINICAL DATA:  Increased falls. EXAM: RIGHT HUMERUS - 2+ VIEW COMPARISON:  None Available. FINDINGS: Acute, comminuted fracture deformity is seen involving the head and surgical neck of the proximal right humerus. Approximately 1 shaft width medial  displacement of the distal fracture site is seen. Dislocation of the right humeral head cannot be excluded. Diffuse soft tissue swelling is noted. IMPRESSION: Acute, displaced fracture deformity involving the head and surgical neck of the proximal right humerus. Electronically Signed   By: Virgina Norfolk M.D.   On: 02/27/2022 02:46   US Abdomen Limited RUQ (LIVER/GB)  Result Date: 02/27/2022 CLINICAL DATA:  Elevated LFTs for 6 months. EXAM: ULTRASOUND ABDOMEN LIMITED RIGHT UPPER QUADRANT COMPARISON:  None Available. FINDINGS: Gallbladder: No gallstones or wall thickening visualized. No sonographic Murphy sign noted by sonographer. Common bile duct: Diameter: 4.5 mm Liver: Diffuse increased echogenicity. No focal masses. Portal vein is patent on color Doppler imaging with normal direction of blood flow towards the liver. Other: None. IMPRESSION: 1. Diffuse increased echogenicity throughout the liver is nonspecific but often due to hepatic steatosis. No other abnormalities are identified. Electronically Signed   By: Dorise Bullion III M.D.   On: 02/27/2022 10:56    Impression: *Acute hepatitis *Chronic Alcohol use *Chronic Tylenol use *Fatty liver disease  Plan: Etiology of patient's acute hepatitis likely multifactorial, alcoholic related hepatitis in the setting of chronic/daily alcohol and Tylenol use.  She is also chronically on amiodarone 3+ years which could be playing a role as well.  Ultrasound showed fatty liver disease.  CT scan 05/07/2021 mentioned mild contour nodularity of the liver possible pseudocirrhosis.  We will further work-up with  autoimmune and hemochromatosis labs as well as rule out viral hepatitis.  Daily LFTs and coags.  Counseled on alcohol and Tylenol cessation.   GI to continue to follow.  Elon Alas. Abbey Chatters, D.O. Gastroenterology and Hepatology [facility]    LOS: 1 day     02/28/2022, 12:00 PM

## 2022-02-28 NOTE — Assessment & Plan Note (Signed)
No signs of withdrawal

## 2022-03-01 ENCOUNTER — Encounter (HOSPITAL_COMMUNITY): Payer: Self-pay | Admitting: Internal Medicine

## 2022-03-01 DIAGNOSIS — Z515 Encounter for palliative care: Secondary | ICD-10-CM | POA: Diagnosis not present

## 2022-03-01 DIAGNOSIS — S42211A Unspecified displaced fracture of surgical neck of right humerus, initial encounter for closed fracture: Secondary | ICD-10-CM | POA: Diagnosis not present

## 2022-03-01 DIAGNOSIS — S42213A Unspecified displaced fracture of surgical neck of unspecified humerus, initial encounter for closed fracture: Secondary | ICD-10-CM | POA: Diagnosis not present

## 2022-03-01 DIAGNOSIS — Z7189 Other specified counseling: Secondary | ICD-10-CM

## 2022-03-01 DIAGNOSIS — K7010 Alcoholic hepatitis without ascites: Secondary | ICD-10-CM | POA: Diagnosis not present

## 2022-03-01 DIAGNOSIS — I5032 Chronic diastolic (congestive) heart failure: Secondary | ICD-10-CM | POA: Diagnosis not present

## 2022-03-01 DIAGNOSIS — F0390 Unspecified dementia without behavioral disturbance: Secondary | ICD-10-CM | POA: Diagnosis not present

## 2022-03-01 LAB — COMPREHENSIVE METABOLIC PANEL
ALT: 376 U/L — ABNORMAL HIGH (ref 0–44)
ALT: 841 U/L — ABNORMAL HIGH (ref 0–44)
AST: 507 U/L — ABNORMAL HIGH (ref 15–41)
AST: 1067 U/L — ABNORMAL HIGH (ref 15–41)
Albumin: 3.7 g/dL (ref 3.5–5.0)
Albumin: 3.3 g/dL — ABNORMAL LOW (ref 3.5–5.0)
Alkaline Phosphatase: 48 U/L (ref 38–126)
Alkaline Phosphatase: 51 U/L (ref 38–126)
Anion gap: 7 (ref 5–15)
Anion gap: 4 — ABNORMAL LOW (ref 5–15)
BUN: 14 mg/dL (ref 8–23)
BUN: 10 mg/dL (ref 8–23)
CO2: 22 mmol/L (ref 22–32)
CO2: 24 mmol/L (ref 22–32)
Calcium: 8.9 mg/dL (ref 8.9–10.3)
Calcium: 8.5 mg/dL — ABNORMAL LOW (ref 8.9–10.3)
Chloride: 101 mmol/L (ref 98–111)
Chloride: 101 mmol/L (ref 98–111)
Creatinine, Ser: 0.52 mg/dL (ref 0.44–1.00)
Creatinine, Ser: 0.41 mg/dL — ABNORMAL LOW (ref 0.44–1.00)
GFR, Estimated: >60 mL/min (ref >60)
GFR, Estimated: >60 mL/min (ref >60)
Glucose, Bld: 92 mg/dL (ref 70–99)
Glucose, Bld: 93 mg/dL (ref 70–99)
Potassium: 3.7 mmol/L (ref 3.5–5.1)
Potassium: 3.4 mmol/L — ABNORMAL LOW (ref 3.5–5.1)
Sodium: 130 mmol/L — ABNORMAL LOW (ref 135–145)
Sodium: 129 mmol/L — ABNORMAL LOW (ref 135–145)
Total Bilirubin: 2.1 mg/dL — ABNORMAL HIGH (ref 0.3–1.2)
Total Bilirubin: 1.8 mg/dL — ABNORMAL HIGH (ref 0.3–1.2)
Total Protein: 5.9 g/dL — ABNORMAL LOW (ref 6.5–8.1)
Total Protein: 5.5 g/dL — ABNORMAL LOW (ref 6.5–8.1)

## 2022-03-01 LAB — HEPATITIS PANEL, ACUTE
HCV Ab: NONREACTIVE
Hep A IgM: NONREACTIVE
Hep B C IgM: NONREACTIVE
Hepatitis B Surface Ag: NONREACTIVE

## 2022-03-01 LAB — CBC
HCT: 23.4 % — ABNORMAL LOW (ref 36.0–46.0)
Hemoglobin: 8.2 g/dL — ABNORMAL LOW (ref 12.0–15.0)
MCH: 36 pg — ABNORMAL HIGH (ref 26.0–34.0)
MCHC: 35 g/dL (ref 30.0–36.0)
MCV: 102.6 fL — ABNORMAL HIGH (ref 80.0–100.0)
Platelets: 196 10*3/uL (ref 150–400)
RBC: 2.28 MIL/uL — ABNORMAL LOW (ref 3.87–5.11)
RDW: 13.2 % (ref 11.5–15.5)
WBC: 6.5 10*3/uL (ref 4.0–10.5)
nRBC: 0 % (ref 0.0–0.2)

## 2022-03-01 LAB — URINE CULTURE

## 2022-03-01 LAB — PROTIME-INR
INR: 1.2 (ref 0.8–1.2)
Prothrombin Time: 14.9 seconds (ref 11.4–15.2)

## 2022-03-01 LAB — VITAMIN B12: Vitamin B-12: 1445 pg/mL — ABNORMAL HIGH (ref 180–914)

## 2022-03-01 LAB — FOLATE: Folate: 17 ng/mL (ref >5.9)

## 2022-03-01 MED ORDER — SENNA 8.6 MG PO TABS
ORAL_TABLET | ORAL | Status: AC
  Filled 2022-03-01: qty 2

## 2022-03-01 MED ORDER — METOPROLOL TARTRATE 25 MG PO TABS
25.0000 mg | ORAL_TABLET | Freq: Two times a day (BID) | ORAL | Status: DC
  Administered 2022-03-01 – 2022-03-04 (×6): 25 mg via ORAL
  Filled 2022-03-01 (×6): qty 1

## 2022-03-01 MED ORDER — POLYETHYLENE GLYCOL 3350 17 G PO PACK
17.0000 g | PACK | Freq: Every day | ORAL | Status: DC
  Administered 2022-03-01 – 2022-03-04 (×4): 17 g via ORAL
  Filled 2022-03-01 (×3): qty 1

## 2022-03-01 MED ORDER — POLYETHYLENE GLYCOL 3350 17 G PO PACK
PACK | ORAL | Status: AC
  Filled 2022-03-01: qty 1

## 2022-03-01 MED ORDER — SENNA 8.6 MG PO TABS
2.0000 | ORAL_TABLET | Freq: Every day | ORAL | Status: DC
  Administered 2022-03-01 – 2022-03-04 (×4): 17.2 mg via ORAL
  Filled 2022-03-01 (×3): qty 2

## 2022-03-01 NOTE — TOC Initial Note (Signed)
Transition of Care [facility]) - Initial/Assessment Note    Patient Details  Name: Cynthia Kelley MRN: [id number] Date of Birth: 1956-02-09  Transition of Care [facility]) CM/SW Contact:    Salome Arnt, [place] Phone Number: 03/01/2022, 11:14 AM  Clinical Narrative:  Pt admitted for humeral surgical neck fracture. Assessment completed due to high risk readmission score. Pt's husband at bedside. At baseline, pt is fairly independent with ADLs. She fell Thursday evening and came to hospital the following day. Pt is followed by oncologist at [facility] every 3 months per husband for lung cancer with mets. Pt's husband indicates pt gets up a lot at night to go to bathroom. He requests 3N1 with no preference on agency. Referred to [facility] with [employer] who will drop ship to home. Discussed PT recommendation for outpatient PT. Pt and husband agreeable and request [facility]. Referral made. TOC will continue to follow.                 Expected Discharge Plan: OP Rehab Barriers to Discharge: Continued Medical Work up   Patient Goals and CMS Choice Patient states their goals for this hospitalization and ongoing recovery are:: return home   Choice offered to / list presented to : Patient, Spouse  Expected Discharge Plan and Services Expected Discharge Plan: OP Rehab In-house Referral: Clinical Social Work     Living arrangements for the past 2 months: Single Family Home                 DME Arranged: 3-N-1 DME Agency: [employer] Date DME Agency Contacted: 03/01/22 Time DME Agency Contacted: [phone] Representative spoke with at DME Agency: Caryl Pina            Prior Living Arrangements/Services Living arrangements for the past 2 months: Single Family Home Lives with:: Spouse Patient language and need for interpreter reviewed:: Yes Do you feel safe going back to the place where you live?: Yes      Need for Family Participation in Patient Care: Yes (Comment) Care giver support system  in place?: Yes (comment)   Criminal Activity/Legal Involvement Pertinent to Current Situation/Hospitalization: No - Comment as needed  Activities of Daily Living Home Assistive Devices/Equipment: None ADL Screening (condition at time of admission) Patient's cognitive ability adequate to safely complete daily activities?: Yes Is the patient deaf or have difficulty hearing?: No Does the patient have difficulty seeing, even when wearing glasses/contacts?: No Does the patient have difficulty concentrating, remembering, or making decisions?: Yes Patient able to express need for assistance with ADLs?: Yes Does the patient have difficulty dressing or bathing?: Yes Independently performs ADLs?: No Communication: Independent Dressing (OT): Needs assistance Is this a change from baseline?: Change from baseline, expected to last <3days Grooming: Needs assistance Is this a change from baseline?: Change from baseline, expected to last <3 days Feeding: Independent Bathing: Needs assistance Is this a change from baseline?: Change from baseline, expected to last <3 days Toileting: Needs assistance Is this a change from baseline?: Change from baseline, expected to last <3 days In/Out Bed: Needs assistance Is this a change from baseline?: Change from baseline, expected to last <3 days Walks in Home: Independent Does the patient have difficulty walking or climbing stairs?: Yes Weakness of Legs: Both Weakness of Arms/Hands: Left  Permission Sought/Granted                  Emotional Assessment     Affect (typically observed): Appropriate Orientation: : Oriented to Self, Oriented to Place,  Oriented to  Time, Oriented to Situation Alcohol / Substance Use: Not Applicable Psych Involvement: No (comment)  Admission diagnosis:  Hyponatremia [E87.1] Elevated liver enzymes [R74.8] Humeral surgical neck fracture [S42.213A] Fall, initial encounter [W19.XXXA] Other closed displaced fracture of  proximal end of right humerus, initial encounter [S42.291A] Patient Active Problem List   Diagnosis Date Noted   Chronic alcohol abuse    Long term current use of high dose acetaminophen    Alcoholic steatohepatitis    Elevated liver enzymes    Humeral surgical neck fracture 02/27/2022   Fall at home, initial encounter 02/27/2022   Transaminitis 02/27/2022   Dementia without behavioral disturbance ([place]) 02/27/2022   Hypomagnesemia 02/27/2022   Paroxysmal atrial fibrillation ([place]) 02/27/2022   Hypokalemia 03/03/2021   Leukocytosis 03/03/2021   Macrocytic anemia 03/03/2021   Thrombocytopenia ([place]) 03/03/2021   Prolonged QT interval 03/03/2021   Dehydration 03/03/2021   COPD with acute exacerbation ([place]) 01/14/2021   Acute on chronic diastolic CHF (congestive heart failure) ([place]) 01/14/2021   Acute respiratory failure with hypoxia ([place]) 01/12/2021   Community acquired pneumonia 01/12/2021   Hyponatremia 01/12/2021   Alcohol dependence ([place]) 01/12/2021   Encounter to establish care 11/19/2020   Anxiety 11/19/2020   Chronic diastolic CHF (congestive heart failure) ([place]) 11/11/2020   Typical atrial flutter ([place]) 11/11/2020   Oropharyngeal dysphagia 08/13/2020   Screening for colorectal cancer 07/19/2018   Dry eye syndrome of both eyes 06/08/2018   Nuclear sclerotic cataract of both eyes 06/08/2018   Posterior vitreous detachment of both eyes 06/08/2018   Lung cancer, primary, with metastasis from lung to other site [facility]) 01/16/2018   Osseous metastasis 11/10/2017   Metastatic squamous cell carcinoma 10/20/2017   Tobacco abuse 12/14/2016   Osteopenia 05/15/2013   KNEE, ARTHRITIS, DEGEN./OSTEO 05/11/2010   PCP:  Lindell Spar, MD Pharmacy:   [place], [place] [address] [place] [address] Phone: [phone] Fax: [phone]     Social Determinants of Health (SDOH) Interventions    Readmission Risk Interventions     03/01/2022   11:11 AM 03/03/2021    9:21 AM  Readmission Risk Prevention Plan  Transportation Screening Complete Complete  HRI or Home Care Consult Complete Complete  Social Work Consult for [place] Planning/Counseling Complete Complete  Palliative Care Screening  Not Applicable  Medication Review Press photographer) Complete Complete

## 2022-03-01 NOTE — Evaluation (Signed)
Occupational Therapy Evaluation Patient Details Name: Cynthia Kelley MRN: [id number] DOB: 10-Aug-1956 Today's Date: 03/01/2022   History of Present Illness s/p fall with RUE proximal humerus fx-nonsurgical treatment. Pt has history of metastatic squamous cell carcinoma of the lung to the liver, paroxysmal atrial fibrillation, diastolic CHF, hypertension, chronic to impairment, hyponatremia, COPD, alcohol and tobacco abuse   Clinical Impression   Pt agreeable to OT evaluation today, pt sitting up in bed requesting to use the restroom. OT assisting pt with bed to [facility], pt requiring mod assist with dressing. Sling noted to be put together in correctly with pt wearing backwards. OT removed, corrected, and assisted pt into pajama top then into correctly fitted sling. Pt will benefit from continued skilled OT services for education on hemi-techniques until she is cleared to utilize the RUE. Recommend follow up with MD and referral to OP therapy once pt is cleared to begin therapy for the RUE.       Recommendations for follow up therapy are one component of a multi-disciplinary discharge planning process, led by the attending physician.  Recommendations may be updated based on patient status, additional functional criteria and insurance authorization.   Follow Up Recommendations  Follow physician's recommendations for discharge plan and follow up therapies    Assistance Recommended at Discharge Frequent or constant Supervision/Assistance  Patient can return home with the following A little help with walking and/or transfers;A little help with bathing/dressing/bathroom;Assistance with cooking/housework;Assist for transportation;Help with stairs or ramp for entrance    Functional Status Assessment  Patient has had a recent decline in their functional status and demonstrates the ability to make significant improvements in function in a reasonable and predictable amount of time.  Equipment Recommendations   None recommended by OT       Precautions / Restrictions Precautions Precautions: Fall;Shoulder Shoulder Interventions: Shoulder sling/immobilizer;At all times Required Braces or Orthoses: Sling Restrictions Weight Bearing Restrictions: Yes RUE Weight Bearing: Non weight bearing      Mobility Bed Mobility Overal bed mobility: Modified Independent                  Transfers Overall transfer level: Modified independent Equipment used: None                          ADL either performed or assessed with clinical judgement   ADL Overall ADL's : Needs assistance/impaired Eating/Feeding: Set up;Sitting   Grooming: Set up;Sitting   Upper Body Bathing: Moderate assistance;Sitting   Lower Body Bathing: Moderate assistance;Sitting/lateral leans   Upper Body Dressing : Moderate assistance;Sitting Upper Body Dressing Details (indicate cue type and reason): Educated on hemi-dressing technique, sliding RUE into sleeve and pulling up with LUE, then threading LUE and pulling overhead Lower Body Dressing: Moderate assistance;Sitting/lateral leans;Sit to/from stand Lower Body Dressing Details (indicate cue type and reason): mod assist with threading legs and bringing up over hips Toilet Transfer: Min Patent examiner Details (indicate cue type and reason): OT assisting with managing heart monitor cords Toileting- Clothing Manipulation and Hygiene: Supervision/safety;Sitting/lateral lean       Functional mobility during ADLs: Min guard       Vision Baseline Vision/History: 1 Wears glasses Ability to See in Adequate Light: 0 Adequate Patient Visual Report: No change from baseline Vision Assessment?: No apparent visual deficits            Pertinent Vitals/Pain Pain Assessment Pain Assessment: No/denies pain     Hand Dominance Right   Extremity/Trunk Assessment  Upper Extremity Assessment Upper Extremity Assessment: RUE  deficits/detail RUE Deficits / Details: RUE immobilized in sling RUE: Unable to fully assess due to immobilization;Unable to fully assess due to pain   Lower Extremity Assessment Lower Extremity Assessment: Defer to PT evaluation   Cervical / Trunk Assessment Cervical / Trunk Assessment: Normal   Communication Communication Communication: No difficulties   Cognition Arousal/Alertness: Awake/alert Behavior During Therapy: WFL for tasks assessed/performed Overall Cognitive Status: Within Functional Limits for tasks assessed                                                  Home Living Family/patient expects to be discharged to:: Private residence Living Arrangements: Spouse/significant other Available Help at Discharge: Family Type of Home: House       Home Layout: One level     Bathroom Shower/Tub: Tub/shower unit                    Prior Functioning/Environment Prior Level of Function : Independent/Modified Independent             Mobility Comments: independent in mobility ADLs Comments: independent in ADLs        OT Problem List: Decreased strength;Decreased activity tolerance;Impaired balance (sitting and/or standing);Decreased safety awareness;Decreased knowledge of use of DME or AE;Impaired UE functional use;Pain      OT Treatment/Interventions: Self-care/ADL training;Therapeutic exercise;DME and/or AE instruction;Therapeutic activities;Patient/family education    OT Goals(Current goals can be found in the care plan section) Acute Rehab OT Goals Patient Stated Goal: To go home OT Goal Formulation: With patient Time For Goal Achievement: 03/15/22 Potential to Achieve Goals: Good  OT Frequency: Min 1X/week       AM-PAC OT "6 Clicks" Daily Activity     Outcome Measure Help from another person eating meals?: None Help from another person taking care of personal grooming?: A Little Help from another person toileting, which  includes using toliet, bedpan, or urinal?: A Little Help from another person bathing (including washing, rinsing, drying)?: A Lot Help from another person to put on and taking off regular upper body clothing?: A Little Help from another person to put on and taking off regular lower body clothing?: A Little 6 Click Score: 18   End of Session Equipment Utilized During Treatment: Other (comment) (sling for RUE) Nurse Communication: Mobility status;Other (comment) (pt chair with alarm on)  Activity Tolerance: Patient tolerated treatment well Patient left: in chair;with call bell/phone within reach;with chair alarm set  OT Visit Diagnosis: Repeated falls (R29.6);Muscle weakness (generalized) (M62.81);Pain Pain - Right/Left: Right Pain - part of body: Shoulder                Time: 7062-3762 OT Time Calculation (min): 29 min Charges:  OT General Charges $OT Visit: 1 Visit OT Evaluation $OT Eval Low Complexity: 1 Low OT Treatments $Self Care/Home Management : 8-22 mins   Guadelupe Sabin, OTR/L  [phone] 03/01/2022, 2:15 PM

## 2022-03-01 NOTE — Plan of Care (Signed)
  Problem: Acute Rehab OT Goals (only OT should resolve) Goal: Pt. Will Perform Upper Body Dressing Flowsheets (Taken 03/01/2022 1418) Pt Will Perform Upper Body Dressing:  with modified independence  sitting Note: Using hemi-techniques as needed Goal: Pt. Will Perform Lower Body Dressing Flowsheets (Taken 03/01/2022 1418) Pt Will Perform Lower Body Dressing:  with modified independence  sitting/lateral leans  sit to/from stand Goal: Pt. Will Transfer To Toilet Flowsheets (Taken 03/01/2022 1418) Pt Will Transfer to Toilet:  with modified independence  ambulating  regular height toilet Goal: Pt. Will Perform Toileting-Clothing Manipulation Flowsheets (Taken 03/01/2022 1418) Pt Will Perform Toileting - Clothing Manipulation and hygiene:  with modified independence  sitting/lateral leans  sit to/from stand

## 2022-03-01 NOTE — Progress Notes (Addendum)
Gastroenterology Progress Note    Primary Care Physician:  Lindell Spar, MD Primary Gastroenterologist:  Dr. Abbey Chatters  Patient ID: Cynthia Kelley; [id number]; 1956-04-02    Subjective   Denies abdominal pain, N/V, mental status changes, confusion. No overt GI bleeding. Husband at bedside. Notes urinary urgency chronically.    Objective   Vital signs in last 24 hours Temp:  [97.3 F (36.3 C)-98.7 F (37.1 C)] 97.3 F (36.3 C) (06/05 0341) Pulse Rate:  [82] 82 (06/05 0341) Resp:  [17] 17 (06/05 0341) BP: (98-131)/(46-54) 131/54 (06/05 0341) SpO2:  [97 %-98 %] 97 % (06/05 0341) Last BM Date : 02/25/22 (Provider notified)  Physical Exam General:   Alert and oriented, pleasant Abdomen:  Bowel sounds present, soft, non-tender, non-distended. No HSM or hernias noted. No rebound or guarding. No masses appreciated  Msk:  Symmetrical without gross deformities. right arm in sling Extremities:  Without edema. Neurologic:  Alert and  oriented x4  Intake/Output from previous day: 06/04 0701 - 06/05 0700 In: 1266.3 [P.O.:480; I.V.:786.3] Out: -  Intake/Output this shift: Total I/O In: 240 [P.O.:240] Out: -   Lab Results  Recent Labs    02/27/22 0318 02/28/22 0518 03/01/22 0451  WBC 11.0* 9.4 6.5  HGB 10.2* 9.6* 8.2*  HCT 27.7* 27.9* 23.4*  PLT 222 212 196   BMET Recent Labs    02/27/22 0318 02/28/22 0518 03/01/22 0451  NA 123* 130* 129*  K 3.2* 3.7 3.4*  CL 93* 101 101  CO2 23 22 24   GLUCOSE 148* 92 93  BUN 13 14 10   CREATININE 0.56 0.52 0.41*  CALCIUM 9.7 8.9 8.5*   LFT Recent Labs    02/27/22 0318 02/28/22 0518 03/01/22 0451  PROT 6.2* 5.9* 5.5*  ALBUMIN 3.9 3.7 3.3*  AST 293* 507* 1,067*  ALT 177* 376* 841*  ALKPHOS 49 48 51  BILITOT 2.5* 2.1* 1.8*   PT/INR Recent Labs    02/28/22 1617  LABPROT 13.9  INR 1.1   Hepatitis Panel Recent Labs    02/28/22 1617  HEPBSAG NON REACTIVE  HCVAB NON REACTIVE  HEPAIGM NON REACTIVE  HEPBIGM NON  REACTIVE     Studies/Results CT Head Wo Contrast  Result Date: 02/27/2022 CLINICAL DATA:  Worsening weakness and increased falls. EXAM: CT HEAD WITHOUT CONTRAST TECHNIQUE: Contiguous axial images were obtained from the base of the skull through the vertex without intravenous contrast. RADIATION DOSE REDUCTION: This exam was performed according to the departmental dose-optimization program which includes automated exposure control, adjustment of the mA and/or kV according to patient size and/or use of iterative reconstruction technique. COMPARISON:  None Available. FINDINGS: Brain: There is mild cerebral atrophy with widening of the extra-axial spaces and ventricular dilatation. There are areas of decreased attenuation within the white matter tracts of the supratentorial brain, consistent with microvascular disease changes. Vascular: No hyperdense vessel or unexpected calcification. Skull: Normal. Negative for fracture or focal lesion. Sinuses/Orbits: No acute finding. Other: None. IMPRESSION: 1. No acute intracranial abnormality. 2. Generalized cerebral atrophy with chronic white matter small vessel ischemic changes. Electronically Signed   By: Virgina Norfolk M.D.   On: 02/27/2022 04:08   CT Shoulder Right Wo Contrast  Result Date: 02/27/2022 CLINICAL DATA:  Shoulder trauma, fracture of humerus or scapula EXAM: CT OF THE UPPER RIGHT EXTREMITY WITHOUT CONTRAST TECHNIQUE: Multidetector CT imaging of the upper right extremity was performed according to the standard protocol. RADIATION DOSE REDUCTION: This exam was performed according to the departmental dose-optimization program  which includes automated exposure control, adjustment of the mA and/or kV according to patient size and/or use of iterative reconstruction technique. COMPARISON:  X-ray 02/27/2022, CT 08/05/2021 FINDINGS: Bones/Joint/Cartilage Acute heavily comminuted fracture of the right humeral head and neck. Transversely oriented component  through the surgical neck which is anteromedially displaced relative to the humeral head component. The proximal humeral shaft is perched upon the anteroinferior glenoid rim (series 4, image 36). Humeral head fragment is inferiorly subluxed relative to the glenoid but not dislocated. Large 3.0 x 1.8 x 3.0 cm fracture fragment involving the greater tuberosity is posteromedially displaced relative to the humeral head within the joint space. Numerous small comminuted fracture fragments surround the humeral head and neck. Bones appear demineralized and slightly heterogeneous and pathologic fracture of an underlying bone lesion is not excluded, particularly given history of cancer. Remaining osseous structures appear otherwise intact. AC joint within normal limits. Ligaments Suboptimally assessed by CT. Muscles and Tendons Presumed osseous avulsion of the rotator cuff tendons with displaced greater tuberosity fracture. Enlargement of the rotator cuff musculature likely related to intramuscular hematoma formation. Soft tissues Prominent soft tissue swelling about the shoulder and proximal upper arm. Emphysematous changes within the right lung. 6 mm subpleural nodule at the right lung apex is stable from 08/05/2021. Right chest port is in place. IMPRESSION: Acute heavily comminuted and displaced fracture of the right humeral head and neck, as described above. Bones appear demineralized and slightly heterogeneous and pathologic fracture of an underlying bone lesion is not excluded, particularly given history of cancer. Electronically Signed   By: Davina Poke D.O.   On: 02/27/2022 09:11   DG Humerus Right  Result Date: 02/27/2022 CLINICAL DATA:  Increased falls. EXAM: RIGHT HUMERUS - 2+ VIEW COMPARISON:  None Available. FINDINGS: Acute, comminuted fracture deformity is seen involving the head and surgical neck of the proximal right humerus. Approximately 1 shaft width medial displacement of the distal fracture site  is seen. Dislocation of the right humeral head cannot be excluded. Diffuse soft tissue swelling is noted. IMPRESSION: Acute, displaced fracture deformity involving the head and surgical neck of the proximal right humerus. Electronically Signed   By: Virgina Norfolk M.D.   On: 02/27/2022 02:46   US Abdomen Limited RUQ (LIVER/GB)  Result Date: 02/27/2022 CLINICAL DATA:  Elevated LFTs for 6 months. EXAM: ULTRASOUND ABDOMEN LIMITED RIGHT UPPER QUADRANT COMPARISON:  None Available. FINDINGS: Gallbladder: No gallstones or wall thickening visualized. No sonographic Murphy sign noted by sonographer. Common bile duct: Diameter: 4.5 mm Liver: Diffuse increased echogenicity. No focal masses. Portal vein is patent on color Doppler imaging with normal direction of blood flow towards the liver. Other: None. IMPRESSION: 1. Diffuse increased echogenicity throughout the liver is nonspecific but often due to hepatic steatosis. No other abnormalities are identified. Electronically Signed   By: Dorise Bullion III M.D.   On: 02/27/2022 10:56    Assessment  66 y.o. female with a history of metastatic squamous cell carcinoma of lung, afib on Eliquis, heart failure, hypertension, hyponatremia, alcohol and tobacco use, presenting after fall at home and found to have severe fracture of right humeral head and neck. GI consulted due to acutely elevated LFTs.   Elevated LFTs: on admission, AST 293, ALT 177, Tbili 2.5. Transaminases continue to increase, now AST 1067, ALT 841, Tbili slight improved to 1.8. INR yesterday normal. Rechecking INR today. She has no mental status changes or confusion. Etiology likely multifactorial in setting of alcohol, daily Tylenol use, medication effect (chronically on  amiodarone). Extensive serologies pending. Although ferritin elevated, this is likely acute phase reactant and iron sats are normal. Acute hepatitis panel is negative. [place] RUQ with hepatic steatosis. Portal vein is patent. Outside CT  chest/abd/pelvis imaging reviewed from 02/02/22 with too small to characterize right liver hypodensities. She had a liver biopsy in 2019 consistent with metastatic squamous cell carcinoma. May need to consider more dedicated liver imaging this admission if persistently elevated or worsening transaminases.   Anemia: Hgb 8.2 this morning, previously 10.2 on admission. Outside labs with Hgb 13.6 several weeks ago. No overt GI bleeding. Check B12 and folate. Iron low on iron panel, ferritin elevated likely as acute phase reactant.    Plan / Recommendations  Check INR now Needs daily HFP/INR Consider dedicated liver imaging if persistently elevated LFTs in light of history Check B12 and folate Monitor for mental status changes Alcohol cessation    LOS: 2 days    03/01/2022, 11:38 AM  Annitta Needs, PhD, ANP-BC [facility]   ADDENDUM: DF is 10.5 (with PT control of 13). INR stable. Consider NAC if INR increases.

## 2022-03-01 NOTE — Consult Note (Signed)
Consultation Note Date: 03/01/2022   Patient Name: Cynthia Kelley  DOB: [birth year]  MRN: [id number]  Age / Sex: 66 y.o., female  PCP: Lindell Spar, MD Referring Physician: Orson Eva, MD  Reason for Consultation: Establishing goals of care  HPI/Patient Profile: 66 y.o. female  with past medical history of squamous cell carcinoma of the lung to the liver, paroxysmal A-fib, diastolic heart failure, HTN, hyponatremia, COPD, alcohol and tobacco use, admitted on 02/27/2022 with right humeral surgical neck fracture, chronic hyponatremia, chronic heart failure, lung cancer followed at [facility], with surveillance, every 3 months last seen 5/9.   Clinical Assessment and Goals of Care: I have reviewed medical records including EPIC notes, labs and imaging, received report from RN, assessed the patient.  Cynthia Kelley is sitting up in bed.  She greets me, making and mostly keeping eye contact.  She appears her age, does not look frail.  At this time, she is alert and oriented, able to make her needs known.  There is no family at bedside at this time.  We meet at the bedside to discuss diagnosis prognosis, GOC, EOL wishes, disposition and options.  I introduced Palliative Medicine as specialized medical care for people living with serious illness. It focuses on providing relief from the symptoms and stress of a serious illness. The goal is to improve quality of life for both the patient and the family.  We then focused on their current illness.  We talk about her right arm fracture and the plan.  Cynthia Kelley denies questions or concerns at this time.  She tells me that her pain is managed.  She is agreeable to home with home health.  Cynthia Kelley relies on her husband to assist with her care and follow-up needs.  The natural disease trajectory and expectations at EOL were discussed.  Advanced directives, concepts specific to  code status, were not discussed today as Cynthia Kelley is DNR and is not acutely ill.  Discussed the importance of continued conversation with family and the medical providers regarding overall plan of care and treatment options, ensuring decisions are within the context of the patient's values and GOCs.  Questions and concerns were addressed. The family was encouraged to call with questions or concerns.  PMT will continue to support holistically.  Conference with attending, bedside nursing staff, transition of care team related to patient condition, needs, goals of care, disposition.   HCPOA NEXT OF KIN -husband, Serra Younan.    SUMMARY OF RECOMMENDATIONS   Continue to treat the treatable but no CPR or intubation Home with home health. Follow-up with [facility] for oncological surveillance every 3 months  Code Status/Advance Care Planning: DNR  Symptom Management:  Per hospitalist/orthopedic, no additional needs at this time  Palliative Prophylaxis:  Frequent Pain Assessment and Turn Reposition  Additional Recommendations (Limitations, Scope, Preferences): Continue to treat the treatable but no CPR or intubation.  Follow-up with oncology outpatient  Psycho-social/Spiritual:  Desire for further Chaplaincy support:yes Additional Recommendations: Caregiving  Support/Resources and  Education on Hospice  Prognosis:  Unable to determine, based on outcomes.  Oncology recommendations.  Discharge Planning:  Home with home health       Primary Diagnoses: Present on Admission:  Humeral surgical neck fracture  Tobacco abuse  Lung cancer, primary, with metastasis from lung to other site [facility])  Hyponatremia  Hypokalemia  Anxiety   I have reviewed the medical record, interviewed the patient and family, and examined the patient. The following aspects are pertinent.  Past Medical History:  Diagnosis Date   Acute diastolic congestive heart failure, NYHA class 2 ([place])  11/11/2020   Anemia 11/22/2017   Anxiety 11/19/2020   Arthritis    Asymptomatic PVD (peripheral vascular disease) ([place]) 12/14/2016   Suspected.  Hands hyperemic with decreased cap refill. ? History vasospasm   COPD with acute exacerbation ([place]) 01/14/2021   FRACTURE, TIBIAL PLATEAU 05/11/2010   Qualifier: Diagnosis of  By: Aline Brochure MD, [place]     Lung cancer, primary, with metastasis from lung to other site [facility]) 01/16/2018   Metastatic to the liver   Nuclear sclerotic cataract of both eyes 06/08/2018   Osteopenia 05/15/2013   Osteoporosis    osteopenia   Tobacco abuse 12/14/2016   Social History   Socioeconomic History   Marital status: Married    Spouse name: [name]   Number of children: 1   Years of education: 15   Highest education level: Not on file  Occupational History   Occupation: Air traffic controller    Comment: retired  Tobacco Use   Smoking status: Every Day    Packs/day: 0.50    Years: 20.00    Pack years: 10.00    Types: Cigarettes    Start date: 09/28/1963   Smokeless tobacco: Never  Vaping Use   Vaping Use: Never used  Substance and Sexual Activity   Alcohol use: Yes    Alcohol/week: 20.0 standard drinks    Types: 20 Cans of beer per week    Comment: 3-4 beers a day   Drug use: No   Sexual activity: Yes    Birth control/protection: Post-menopausal  Other Topics Concern   Not on file  Social History Narrative   Retired Air traffic controller   Lives with husband [name]   Daughter Marcelino Scot is in [place] to walk   Social Determinants of Radio broadcast assistant Strain: Low Risk    Difficulty of Paying Living Expenses: Not hard at all  Food Insecurity: No Food Insecurity   Worried About Charity fundraiser in the Last Year: Never true   Arboriculturist in the Last Year: Never true  Transportation Needs: No Transportation Needs   Lack of Transportation (Medical): No   Lack of Transportation (Non-Medical): No  Physical Activity: Insufficiently Active   Days of  Exercise per Week: 3 days   Minutes of Exercise per Session: 30 min  Stress: No Stress Concern Present   Feeling of Stress : Not at all  Social Connections: Moderately Isolated   Frequency of Communication with Friends and Family: More than three times a week   Frequency of Social Gatherings with Friends and Family: More than three times a week   Attends Religious Services: Never   Marine scientist or Organizations: No   Attends Archivist Meetings: Never   Marital Status: Married   Family History  Problem Relation Age of Onset   Cancer Father        died in 22's  everywhere   Heart disease Mother    Cancer Mother        lung   Stroke Mother    Asthma Daughter    Scheduled Meds:  Chlorhexidine Gluconate Cloth  6 each Topical Daily   memantine  10 mg Oral q morning   metoprolol tartrate  2.5 mg Intravenous Q6H   pneumococcal 20-valent conjugate vaccine  0.5 mL Intramuscular Tomorrow-1000   polyethylene glycol  17 g Oral Daily   senna  2 tablet Oral Daily   sodium chloride  1 g Oral BID WC   traZODone  50 mg Oral QHS   Continuous Infusions: PRN Meds:.oxyCODONE, prochlorperazine Medications Prior to Admission:  Prior to Admission medications   Medication Sig Start Date End Date Taking? Authorizing Provider  amiodarone (PACERONE) 200 MG tablet Take 200 mg by mouth daily. 02/06/21  Yes [provider]  apixaban (ELIQUIS) 5 MG TABS tablet Take 5 mg by mouth 2 (two) times daily. 09/27/20  Yes [provider]  furosemide (LASIX) 20 MG tablet Take 1 tablet (20 mg total) by mouth daily. Patient taking differently: Take 20 mg by mouth daily as needed for fluid. 01/15/21  Yes Tat, Shanon Brow, MD  memantine (NAMENDA) 10 MG tablet Take 10 mg by mouth every morning. 02/05/21  Yes [provider]  metoprolol (TOPROL-XL) 200 MG 24 hr tablet Take 200 mg by mouth daily. 01/02/21  Yes [provider]  Multiple Vitamins-Minerals (MULTIVITAMIN WITH  MINERALS) tablet Take 1 tablet by mouth daily.   Yes [provider]  potassium chloride (KLOR-CON) 10 MEQ tablet Take 10 mEq by mouth 2 (two) times daily. 10/05/21  Yes [provider]  sodium chloride 1 g tablet Take 1 tablet (1 g total) by mouth 2 (two) times daily with a meal. Patient taking differently: Take 1 g by mouth daily. 03/06/21  Yes Barton Dubois, MD  traZODone (DESYREL) 50 MG tablet Take 50 mg by mouth at bedtime. 02/12/22  Yes [provider]   No Known Allergies Review of Systems  Unable to perform ROS: Other   Physical Exam Vitals and nursing note reviewed.  Constitutional:      General: She is not in acute distress.    Appearance: Normal appearance. She is not ill-appearing.  Cardiovascular:     Rate and Rhythm: Normal rate.  Musculoskeletal:        General: Swelling present.     Comments: Right upper arm swollen  Skin:    General: Skin is warm and dry.  Neurological:     General: No focal deficit present.     Mental Status: She is alert.  Psychiatric:        Mood and Affect: Mood normal.        Behavior: Behavior normal.    Vital Signs: BP (!) 125/47 (BP Location: Left Arm)   Pulse 75   Temp 98 F (36.7 C)   Resp 16   Ht 5\' 6"  (1.676 m)   Wt 54.8 kg   SpO2 94%   BMI 19.50 kg/m  Pain Scale: 0-10 POSS *See Group Information*: 1-Acceptable,Awake and alert Pain Score: 5    SpO2: SpO2: 94 % O2 Device:SpO2: 94 % O2 Flow Rate: .O2 Flow Rate (L/min): 0 L/min  IO: Intake/output summary:  Intake/Output Summary (Last 24 hours) at 03/01/2022 1527 Last data filed at 03/01/2022 1400 Gross per 24 hour  Intake 480 ml  Output 150 ml  Net 330 ml    LBM: Last BM  Date : 02/25/22 (Provider notified) Baseline Weight: Weight: 61.2 kg Most recent weight: Weight: 54.8 kg     Palliative Assessment/Data:   Flowsheet Rows    Flowsheet Row Most Recent Value  Intake Tab   Referral Department Hospitalist  Unit at Time of Referral  Cardiac/Telemetry Unit  Palliative Care Primary Diagnosis Trauma  Date Notified 03/01/22  Palliative Care Type Return patient Palliative Care  Reason for referral Clarify Goals of Care  Date of Admission 02/27/22  Date first seen by Palliative Care 03/01/22  # of days Palliative referral response time 0 Day(s)  # of days IP prior to Palliative referral 2  Clinical Assessment   Palliative Performance Scale Score 60%  Pain Max last 24 hours Not able to report  Pain Min Last 24 hours Not able to report  Dyspnea Max Last 24 Hours Not able to report  Dyspnea Min Last 24 hours Not able to report  Psychosocial & Spiritual Assessment   Palliative Care Outcomes        Time In: 1400 Time Out: 1440 Time Total: 40 minutes  Greater than 50%  of this time was spent counseling and coordinating care related to the above assessment and plan.  Signed by: Drue Novel, NP   Please contact Palliative Medicine Team phone at [phone] for questions and concerns.  For individual provider: See Shea Evans

## 2022-03-01 NOTE — Plan of Care (Signed)

## 2022-03-02 ENCOUNTER — Other Ambulatory Visit: Payer: Self-pay

## 2022-03-02 DIAGNOSIS — S42291A Other displaced fracture of upper end of right humerus, initial encounter for closed fracture: Secondary | ICD-10-CM

## 2022-03-02 DIAGNOSIS — R748 Abnormal levels of other serum enzymes: Secondary | ICD-10-CM

## 2022-03-02 LAB — CBC
HCT: 24.5 % — ABNORMAL LOW (ref 36.0–46.0)
Hemoglobin: 8.2 g/dL — ABNORMAL LOW (ref 12.0–15.0)
MCH: 34.7 pg — ABNORMAL HIGH (ref 26.0–34.0)
MCHC: 33.5 g/dL (ref 30.0–36.0)
MCV: 103.8 fL — ABNORMAL HIGH (ref 80.0–100.0)
Platelets: 208 10*3/uL (ref 150–400)
RBC: 2.36 MIL/uL — ABNORMAL LOW (ref 3.87–5.11)
RDW: 13.4 % (ref 11.5–15.5)
WBC: 5.3 10*3/uL (ref 4.0–10.5)
nRBC: 0 % (ref 0.0–0.2)

## 2022-03-02 LAB — PROTIME-INR
INR: 1.2 (ref 0.8–1.2)
Prothrombin Time: 14.8 seconds (ref 11.4–15.2)

## 2022-03-02 LAB — COMPREHENSIVE METABOLIC PANEL
ALT: 1035 U/L — ABNORMAL HIGH (ref 0–44)
AST: 835 U/L — ABNORMAL HIGH (ref 15–41)
Albumin: 3.3 g/dL — ABNORMAL LOW (ref 3.5–5.0)
Alkaline Phosphatase: 62 U/L (ref 38–126)
Anion gap: <3 — ABNORMAL LOW (ref 5–15)
BUN: 8 mg/dL (ref 8–23)
CO2: 26 mmol/L (ref 22–32)
Calcium: 8.4 mg/dL — ABNORMAL LOW (ref 8.9–10.3)
Chloride: 101 mmol/L (ref 98–111)
Creatinine, Ser: 0.36 mg/dL — ABNORMAL LOW (ref 0.44–1.00)
GFR, Estimated: >60 mL/min (ref >60)
Glucose, Bld: 99 mg/dL (ref 70–99)
Potassium: 4.1 mmol/L (ref 3.5–5.1)
Sodium: 129 mmol/L — ABNORMAL LOW (ref 135–145)
Total Bilirubin: 1.7 mg/dL — ABNORMAL HIGH (ref 0.3–1.2)
Total Protein: 5.7 g/dL — ABNORMAL LOW (ref 6.5–8.1)

## 2022-03-02 LAB — CK: Total CK: 112 U/L (ref 38–234)

## 2022-03-02 LAB — ANA: Anti Nuclear Antibody (ANA): NEGATIVE

## 2022-03-02 NOTE — Progress Notes (Addendum)
Subjective: Feeling well overall.  Has some pain in her right shoulder if she moves her arm, otherwise no significant pain.  Denies abdominal pain, nausea, vomiting, BRBPR, melena.   Asked about the cause of patient's fall.  Patient reports she remembers slipping on a rug, fell she fell in the bathroom.  Patient's husband states that she was actually coming in from outside walking up to stairs, tripped and fell.  He was not there to witness the fall as he was in the room beside her.  He does not think she lost consciousness.  Patient does not remember this though she does have some memory issues from prior chemoradiation treatments.  Unclear if this was from a syncopal event.  Husband gives history of episodes where patient will become limp/faint while getting up to urinate at night.  She is never had any daytime symptoms.  Colonoscopy more than 10 years ago.  I was able to locate colonoscopy records in her chart.  Last colonoscopy was in 2006 with Dr. Gala Romney revealing normal rectum, pancolonic diverticula, otherwise normal exam.  Recommended repeat in 10 years.  Objective: Vital signs in last 24 hours: Temp:  [98 F (36.7 C)-99 F (37.2 C)] 98.6 F (37 C) (06/06 0539) Pulse Rate:  [73-92] 73 (06/06 0539) Resp:  [16-20] 18 (06/06 0539) BP: (125-134)/(47-65) 133/58 (06/06 0539) SpO2:  [94 %-99 %] 99 % (06/06 0539) Last BM Date : 02/25/22 (Provider notified) General:   Alert and oriented, pleasant, NAD.  Head:  Normocephalic and atraumatic. Eyes:  No icterus, sclera clear. Conjuctiva pink.  Abdomen:  Bowel sounds present, soft, non-tender, non-distended. No HSM or hernias noted. No rebound or guarding. No masses appreciated  Msk:  Symmetrical without gross deformities. Normal posture. Extremities:  Without edema. Neurologic:  Alert and  oriented x4;  grossly normal neurologically. Psych:  Normal mood and affect.  Intake/Output from previous day: 06/05 0701 - 06/06 0700 In: 720  [P.O.:720] Out: 975 [Urine:975] Intake/Output this shift: No intake/output data recorded.  Lab Results: Recent Labs    02/28/22 0518 03/01/22 0451 03/02/22 0822  WBC 9.4 6.5 5.3  HGB 9.6* 8.2* 8.2*  HCT 27.9* 23.4* 24.5*  PLT 212 196 208   BMET Recent Labs    02/28/22 0518 03/01/22 0451 03/02/22 0433  NA 130* 129* 129*  K 3.7 3.4* 4.1  CL 101 101 101  CO2 22 24 26   GLUCOSE 92 93 99  BUN 14 10 8   CREATININE 0.52 0.41* 0.36*  CALCIUM 8.9 8.5* 8.4*   LFT Recent Labs    02/28/22 0518 03/01/22 0451 03/02/22 0433  PROT 5.9* 5.5* 5.7*  ALBUMIN 3.7 3.3* 3.3*  AST 507* 1,067* 835*  ALT 376* 841* 1,035*  ALKPHOS 48 51 52  BILITOT 2.1* 1.8* 1.7*   PT/INR Recent Labs    03/01/22 1100 03/02/22 0822  LABPROT 14.9 14.8  INR 1.2 1.2   Hepatitis Panel Recent Labs    02/28/22 1617  HEPBSAG NON REACTIVE  HCVAB NON REACTIVE  HEPAIGM NON REACTIVE  HEPBIGM NON REACTIVE   Assessment: 66 y.o. female with a history of metastatic squamous cell carcinoma of lung involving the liver s/p chemoradiation and undergoing routine surveillance with oncology, afib on Eliquis, heart failure, hypertension, hyponatremia, alcohol and tobacco use, presenting on 6/3 after fall at home and found to have severe fracture of right humeral head and neck. GI consulted due to acutely elevated LFTs.   Elevated LFTs: Acutely elevated on admission with AST 293,  ALT 177, total bilirubin 2.5, previously within normal limits in June 2022.  Unfortunately, LFTs continue to rise this admission.  Yesterday AST 1067, ALT 841. Today, AST 835, ALT 1035.  Bilirubin slowly improving, down to 1.7 today.  INR has remained within normal limits.  No hepatic encephalopathy. Etiology suspected to be multifactorial in the setting of alcohol, daily Tylenol use, medication effect (chronically on amiodarone, now on hold).  There is some question of possible syncopal episode vs tripping and falling prior to admission, but no  known loss of consciousness and hasn't been hypotensive since presenting to the hospital.   Evaluation thus far with acute hepatitis panel negative.  Ferritin is elevated, but likely acute phase reactant as iron and saturation are low.  Autoimmune serologies pending. RUQ ultrasound with hepatic steatosis.  Portal vein patent.   Outside CT on 5/9 with too small to characterize right liver hypodensities, stable compared to prior CT in February.    Down trending AST is encouraging, but it is unclear to me why her ALT is still increasing. Discussed with Dr. Jenetta Downer. Will check CK, CMV, EBV, Hep B DNA, and Hep C RNA. Considering her history of metastatic squamous cell carcinoma involving the liver, may need to consider more dedicated liver imaging if persistent LFT elevation/worsening transaminitis.   Anemia:  Hemoglobin stable at 8.2 this morning, previously 10.2 on admission.  Labs reviewed in care everywhere with hemoglobin of 13.6 on 02/02/2022.  Iron and iron saturation low this admission, ferritin elevated likely has acute phase reactant.  No B12 or folate deficiency. No overt GI bleeding. Last colonoscopy normal in 2006, currently overdue.  No prior EGD.  As hemoglobin has remained stable and she has no overt bleeding, recommend we continue to trend and consider outpatient endoscopic evaluation.  Plan: CK, CMV, EBV, Hep B DNA, and Hep C RNA Follow-up on pending serologies.  Continue to trend LFTs and INR daily.  Monitor for mental status changes.  Consider dedicated liver imaging if persistently elevated LFTs in light of history of metastatic squamous cell carcinoma. Needs alcohol cessation. Avoid hepatic toxic medications. Continue to trend H&H daily and monitor for overt GI bleeding. Consider outpatient colonoscopy and possible EGD for further evaluation of anemia.    LOS: 3 days    03/02/2022, 10:19 AM   Aliene Altes, PA-C [facility]

## 2022-03-02 NOTE — Progress Notes (Signed)
PROGRESS NOTE  Cynthia Kelley MAU:[id number] DOB: 11-20-1955 DOA: 02/27/2022 PCP: Lindell Spar, MD  Brief History:  66 year old female with a history of metastatic squamous cell carcinoma of the lung to the liver, paroxysmal atrial fibrillation, diastolic CHF, hypertension, chronic to impairment, hyponatremia, COPD, alcohol and tobacco abuse presenting with generalized weakness and falls.  Apparently, the patient had a mechanical fall on the evening of 02/26/2022.  She stated that she slipped on a rug and fell onto her right side.  She denied hitting her head or loss of consciousness.  She denies any new medications.  However, she states that she continues to drink alcohol on a daily basis.  She states that she drinks 2 beers and 3 mixed drinks on a daily basis.  She continues to smoke 1 pack/day. Patient denies fevers, chills, headache, chest pain, dyspnea, nausea, vomiting, diarrhea, abdominal pain, dysuria, hematuria, hematochezia, and melena. In the ED, the patient was afebrile and hemodynamically stable with oxygen saturation 96% on room air.  BMP showed a sodium 123, potassium 3.2, bicarbonate 23, serum creatinine 0.56.  AST 293, ALT to 177, total bilirubin 2.5, magnesium 1.3.  WBC 11.0, hemoglobin 10.3, platelets 222,000.  EKG shows sinus rhythm with nonspecific T wave changes.  CT of the brain showed generalized atrophy.  X-ray of the right shoulder showed an acute displaced fracture of the right humerus head and surgical neck.  Orthopedics was consulted.    Assessment and Plan: * Humeral surgical neck fracture Orthopedics consulted --remain in sling for now --Dr. Amedeo Kinsman saw patient --pt and spouse opted for nonoperative management --continue judicious opioids  Chronic alcohol abuse No signs of withdrawal  Paroxysmal atrial fibrillation (HCC) Currently in sinus rhythm Holding amiodarone secondary to elevated LFTs Holding apixaban in preparation for orthopedic intervention  initially and now with concerns of possible hepatic failure Restart metoprolol succinate at lower dose   Hypomagnesemia Replete  Dementia without behavioral disturbance (HCC) Continue Namenda  Transaminitis RUQ ultrasound--hepatic steatosis Hold amiodarone LFTs continue to trend up AST 1067>>835, ALT 841>>1035 GI consult appreciated  Fall at home, initial encounter PT eval>>outpt PT  Hypokalemia Replete Mag 1.9  Hyponatremia This has been chronic and multifactorial -Including chronic alcohol use, poor solute intake, SIADH, CHF -Continue home dose sodium chloride tablets, 1 g twice daily -Baseline sodium 125-130 Presented with sodium 123 Urine osmolarity--565 Serum osmolarity--261 FeNa 0.1% Na back to baseline after isotonic saline  Anxiety PDMP reviewed -Last prescription for Ativan 1 mg, #30 on 5/63/8937  Chronic diastolic CHF (congestive heart failure) ([place]) Appears clinically euvolemic Daily weights Hold home dose furosemide temporarily  Lung cancer, primary, with metastasis from lung to other site [facility]) Last follow-up 02/02/2022 at [facility] -- Status post chemoradiation -- 02/02/2022 CT abdomen pelvis and chest did not show any new metastasis; there was stable pulmonary nodules -- Currently under ongoing surveillance with instructions to follow-up in 3 months  Tobacco abuse Tobacco cessation discussed    Family Communication:  spouse updated 6/4 Consultants:  ortho   Code Status:  DNR   DVT Prophylaxis:  SCDs     Procedures: As Listed in Progress Note Above   Antibiotics: None       Subjective: Patient denies fevers, chills, headache, chest pain, dyspnea, nausea, vomiting, diarrhea, abdominal pain, dysuria, hematuria, hematochezia, and melena.   Objective: Vitals:   03/01/22 1312 03/01/22 2133 03/02/22 0539 03/02/22 1400  BP: (!) 125/47 126/65 (!) 133/58 130/70  Pulse: 75  92 73 71  Resp: 16 20 18    Temp: 98 F (36.7 C) 99 F (37.2 C)  98.6 F (37 C) 98 F (36.7 C)  TempSrc:    Oral  SpO2: 94% 99% 99% 98%  Weight:      Height:        Intake/Output Summary (Last 24 hours) at 03/02/2022 1842 Last data filed at 03/02/2022 1700 Gross per 24 hour  Intake 720 ml  Output 825 ml  Net -105 ml   Weight change:  Exam:  General:  Pt is alert, follows commands appropriately, not in acute distress HEENT: No icterus, No thrush, No neck mass, [place]/AT Cardiovascular: RRR, S1/S2, no rubs, no gallops Respiratory: CTA bilaterally, no wheezing, no crackles, no rhonchi Abdomen: Soft/+BS, non tender, non distended, no guarding Extremities: No edema, No lymphangitis, No petechiae, No rashes, no synovitis   Data Reviewed: I have personally reviewed following labs and imaging studies Basic Metabolic Panel: Recent Labs  Lab 02/27/22 0318 02/28/22 0518 03/01/22 0451 03/02/22 0433  NA 123* 130* 129* 129*  K 3.2* 3.7 3.4* 4.1  CL 93* 101 101 101  CO2 23 22 24 26   GLUCOSE 148* 92 93 99  BUN 13 14 10 8   CREATININE 0.56 0.52 0.41* 0.36*  CALCIUM 9.7 8.9 8.5* 8.4*  MG 1.3* 1.9  --   --   PHOS 4.3  --   --   --    Liver Function Tests: Recent Labs  Lab 02/27/22 0318 02/28/22 0518 03/01/22 0451 03/02/22 0433  AST 293* 507* 1,067* 835*  ALT 177* 376* 841* 1,035*  ALKPHOS 49 48 51 62  BILITOT 2.5* 2.1* 1.8* 1.7*  PROT 6.2* 5.9* 5.5* 5.7*  ALBUMIN 3.9 3.7 3.3* 3.3*   No results for input(s): LIPASE, AMYLASE in the last 168 hours. No results for input(s): AMMONIA in the last 168 hours. Coagulation Profile: Recent Labs  Lab 02/28/22 1617 03/01/22 1100 03/02/22 0822  INR 1.1 1.2 1.2   CBC: Recent Labs  Lab 02/27/22 0318 02/28/22 0518 03/01/22 0451 03/02/22 0822  WBC 11.0* 9.4 6.5 5.3  NEUTROABS 9.7*  --   --   --   HGB 10.2* 9.6* 8.2* 8.2*  HCT 27.7* 27.9* 23.4* 24.5*  MCV 97.5 102.2* 102.6* 103.8*  PLT 222 212 196 208   Cardiac Enzymes: Recent Labs  Lab 03/02/22 1510  CKTOTAL 112   BNP: Invalid  input(s): POCBNP CBG: No results for input(s): GLUCAP in the last 168 hours. HbA1C: No results for input(s): HGBA1C in the last 72 hours. Urine analysis:    Component Value Date/Time   COLORURINE YELLOW 02/27/2022 0823   APPEARANCEUR CLEAR 02/27/2022 0823   LABSPEC 1.029 02/27/2022 0823   PHURINE 6.0 02/27/2022 0823   GLUCOSEU NEGATIVE 02/27/2022 0823   HGBUR NEGATIVE 02/27/2022 0823   BILIRUBINUR NEGATIVE 02/27/2022 0823   BILIRUBINUR small 09/15/2017 1210   KETONESUR NEGATIVE 02/27/2022 0823   PROTEINUR NEGATIVE 02/27/2022 0823   UROBILINOGEN 0.2 09/15/2017 1210   NITRITE NEGATIVE 02/27/2022 0823   LEUKOCYTESUR NEGATIVE 02/27/2022 0823   Sepsis Labs: @LABRCNTIP (procalcitonin:4,lacticidven:4) ) Recent Results (from the past 240 hour(s))  Urine Culture     Status: Abnormal   Collection Time: 02/27/22  8:23 AM   Specimen: Urine, Clean Catch  Result Value Ref Range Status   Specimen Description   Final    URINE, CLEAN CATCH Performed at [facility], [address], [place], [place] [address]    Special Requests   Final  NONE Performed at [facility], [address], [place], [place] [address]    Culture MULTIPLE SPECIES PRESENT, SUGGEST RECOLLECTION (A)  Final   Report Status 03/01/2022 FINAL  Final     Scheduled Meds:  Chlorhexidine Gluconate Cloth  6 each Topical Daily   memantine  10 mg Oral q morning   metoprolol tartrate  25 mg Oral BID   pneumococcal 20-valent conjugate vaccine  0.5 mL Intramuscular Tomorrow-1000   polyethylene glycol  17 g Oral Daily   senna  2 tablet Oral Daily   sodium chloride  1 g Oral BID WC   traZODone  50 mg Oral QHS   Continuous Infusions:  Procedures/Studies: CT Head Wo Contrast  Result Date: 02/27/2022 CLINICAL DATA:  Worsening weakness and increased falls. EXAM: CT HEAD WITHOUT CONTRAST TECHNIQUE: Contiguous axial images were obtained from the base of the skull through the vertex without intravenous contrast. RADIATION DOSE  REDUCTION: This exam was performed according to the departmental dose-optimization program which includes automated exposure control, adjustment of the mA and/or kV according to patient size and/or use of iterative reconstruction technique. COMPARISON:  None Available. FINDINGS: Brain: There is mild cerebral atrophy with widening of the extra-axial spaces and ventricular dilatation. There are areas of decreased attenuation within the white matter tracts of the supratentorial brain, consistent with microvascular disease changes. Vascular: No hyperdense vessel or unexpected calcification. Skull: Normal. Negative for fracture or focal lesion. Sinuses/Orbits: No acute finding. Other: None. IMPRESSION: 1. No acute intracranial abnormality. 2. Generalized cerebral atrophy with chronic white matter small vessel ischemic changes. Electronically Signed   By: Virgina Norfolk M.D.   On: 02/27/2022 04:08   CT Shoulder Right Wo Contrast  Result Date: 02/27/2022 CLINICAL DATA:  Shoulder trauma, fracture of humerus or scapula EXAM: CT OF THE UPPER RIGHT EXTREMITY WITHOUT CONTRAST TECHNIQUE: Multidetector CT imaging of the upper right extremity was performed according to the standard protocol. RADIATION DOSE REDUCTION: This exam was performed according to the departmental dose-optimization program which includes automated exposure control, adjustment of the mA and/or kV according to patient size and/or use of iterative reconstruction technique. COMPARISON:  X-ray 02/27/2022, CT 08/05/2021 FINDINGS: Bones/Joint/Cartilage Acute heavily comminuted fracture of the right humeral head and neck. Transversely oriented component through the surgical neck which is anteromedially displaced relative to the humeral head component. The proximal humeral shaft is perched upon the anteroinferior glenoid rim (series 4, image 36). Humeral head fragment is inferiorly subluxed relative to the glenoid but not dislocated. Large 3.0 x 1.8 x 3.0 cm  fracture fragment involving the greater tuberosity is posteromedially displaced relative to the humeral head within the joint space. Numerous small comminuted fracture fragments surround the humeral head and neck. Bones appear demineralized and slightly heterogeneous and pathologic fracture of an underlying bone lesion is not excluded, particularly given history of cancer. Remaining osseous structures appear otherwise intact. AC joint within normal limits. Ligaments Suboptimally assessed by CT. Muscles and Tendons Presumed osseous avulsion of the rotator cuff tendons with displaced greater tuberosity fracture. Enlargement of the rotator cuff musculature likely related to intramuscular hematoma formation. Soft tissues Prominent soft tissue swelling about the shoulder and proximal upper arm. Emphysematous changes within the right lung. 6 mm subpleural nodule at the right lung apex is stable from 08/05/2021. Right chest port is in place. IMPRESSION: Acute heavily comminuted and displaced fracture of the right humeral head and neck, as described above. Bones appear demineralized and slightly heterogeneous and pathologic fracture of an underlying bone lesion  is not excluded, particularly given history of cancer. Electronically Signed   By: Davina Poke D.O.   On: 02/27/2022 09:11   DG Humerus Right  Result Date: 02/27/2022 CLINICAL DATA:  Increased falls. EXAM: RIGHT HUMERUS - 2+ VIEW COMPARISON:  None Available. FINDINGS: Acute, comminuted fracture deformity is seen involving the head and surgical neck of the proximal right humerus. Approximately 1 shaft width medial displacement of the distal fracture site is seen. Dislocation of the right humeral head cannot be excluded. Diffuse soft tissue swelling is noted. IMPRESSION: Acute, displaced fracture deformity involving the head and surgical neck of the proximal right humerus. Electronically Signed   By: Virgina Norfolk M.D.   On: 02/27/2022 02:46   US Abdomen  Limited RUQ (LIVER/GB)  Result Date: 02/27/2022 CLINICAL DATA:  Elevated LFTs for 6 months. EXAM: ULTRASOUND ABDOMEN LIMITED RIGHT UPPER QUADRANT COMPARISON:  None Available. FINDINGS: Gallbladder: No gallstones or wall thickening visualized. No sonographic Murphy sign noted by sonographer. Common bile duct: Diameter: 4.5 mm Liver: Diffuse increased echogenicity. No focal masses. Portal vein is patent on color Doppler imaging with normal direction of blood flow towards the liver. Other: None. IMPRESSION: 1. Diffuse increased echogenicity throughout the liver is nonspecific but often due to hepatic steatosis. No other abnormalities are identified. Electronically Signed   By: Dorise Bullion III M.D.   On: 02/27/2022 10:56    Orson Eva, DO  [facility]  If 7PM-7AM, please contact night-coverage [email or web address] Password [email or web address] 03/02/2022, 6:42 PM   LOS: 3 days

## 2022-03-02 NOTE — Progress Notes (Signed)
PROGRESS NOTE  Cynthia Kelley YYT:[id number] DOB: 07-Dec-1955 DOA: 02/27/2022 PCP: Lindell Spar, MD  Brief History:  66 year old female with a history of metastatic squamous cell carcinoma of the lung to the liver, paroxysmal atrial fibrillation, diastolic CHF, hypertension, chronic to impairment, hyponatremia, COPD, alcohol and tobacco abuse presenting with generalized weakness and falls.  Apparently, the patient had a mechanical fall on the evening of 02/26/2022.  She stated that she slipped on a rug and fell onto her right side.  She denied hitting her head or loss of consciousness.  She denies any new medications.  However, she states that she continues to drink alcohol on a daily basis.  She states that she drinks 2 beers and 3 mixed drinks on a daily basis.  She continues to smoke 1 pack/day. Patient denies fevers, chills, headache, chest pain, dyspnea, nausea, vomiting, diarrhea, abdominal pain, dysuria, hematuria, hematochezia, and melena. In the ED, the patient was afebrile and hemodynamically stable with oxygen saturation 96% on room air.  BMP showed a sodium 123, potassium 3.2, bicarbonate 23, serum creatinine 0.56.  AST 293, ALT to 177, total bilirubin 2.5, magnesium 1.3.  WBC 11.0, hemoglobin 10.3, platelets 222,000.  EKG shows sinus rhythm with nonspecific T wave changes.  CT of the brain showed generalized atrophy.  X-ray of the right shoulder showed an acute displaced fracture of the right humerus head and surgical neck.  Orthopedics was consulted.     Assessment and Plan: * Humeral surgical neck fracture Orthopedics consulted --remain in sling for now --Dr. Amedeo Kinsman saw patient --pt and spouse opted for nonoperative management --continue judicious opioids  Chronic alcohol abuse No signs of withdrawal  Paroxysmal atrial fibrillation (HCC) Currently in sinus rhythm Holding amiodarone secondary to elevated LFTs Holding apixaban in preparation for orthopedic intervention  initially and now with concerns of possible hepatic failure Restart metoprolol succinate at lower dose   Hypomagnesemia Replete  Dementia without behavioral disturbance (HCC) Continue Namenda  Transaminitis RUQ ultrasound--hepatic steatosis Hold amiodarone LFTs continue to trend up AST 1067>>835, ALT 841>>1035 GI consult appreciated  Fall at home, initial encounter PT eval>>outpt PT  Hypokalemia Replete Mag 1.9  Hyponatremia This has been chronic and multifactorial -Including chronic alcohol use, poor solute intake, SIADH, CHF -Continue home dose sodium chloride tablets, 1 g twice daily -Baseline sodium 125-130 Presented with sodium 123 Urine osmolarity--565 Serum osmolarity--261 FeNa 0.1% Na back to baseline after isotonic saline  Anxiety PDMP reviewed -Last prescription for Ativan 1 mg, #30 on 2/75/1700  Chronic diastolic CHF (congestive heart failure) ([place]) Appears clinically euvolemic Daily weights Hold home dose furosemide temporarily  Lung cancer, primary, with metastasis from lung to other site [facility]) Last follow-up 02/02/2022 at [facility] -- Status post chemoradiation -- 02/02/2022 CT abdomen pelvis and chest did not show any new metastasis; there was stable pulmonary nodules -- Currently under ongoing surveillance with instructions to follow-up in 3 months  Tobacco abuse Tobacco cessation discussed    Family Communication:  spouse updated 6/4 Consultants:  ortho   Code Status:  DNR   DVT Prophylaxis:  SCDs     Procedures: As Listed in Progress Note Above   Antibiotics: None      Subjective: Patient denies fevers, chills, headache, chest pain, dyspnea, nausea, vomiting, diarrhea, abdominal pain, dysuria, hematuria, hematochezia, and melena.   Objective: Vitals:   03/01/22 1312 03/01/22 2133 03/02/22 0539 03/02/22 1400  BP: (!) 125/47 126/65 (!) 133/58 130/70  Pulse: 75  92 73 71  Resp: 16 20 18    Temp: 98 F (36.7 C) 99 F (37.2 C) 98.6  F (37 C) 98 F (36.7 C)  TempSrc:    Oral  SpO2: 94% 99% 99% 98%  Weight:      Height:        Intake/Output Summary (Last 24 hours) at 03/02/2022 1846 Last data filed at 03/02/2022 1700 Gross per 24 hour  Intake 720 ml  Output 825 ml  Net -105 ml   Weight change:  Exam:  General:  Pt is alert, follows commands appropriately, not in acute distress HEENT: No icterus, No thrush, No neck mass, [place]/AT Cardiovascular: RRR, S1/S2, no rubs, no gallops Respiratory: CTA bilaterally, no wheezing, no crackles, no rhonchi Abdomen: Soft/+BS, non tender, non distended, no guarding Extremities: No edema, No lymphangitis, No petechiae, No rashes, no synovitis   Data Reviewed: I have personally reviewed following labs and imaging studies Basic Metabolic Panel: Recent Labs  Lab 02/27/22 0318 02/28/22 0518 03/01/22 0451 03/02/22 0433  NA 123* 130* 129* 129*  K 3.2* 3.7 3.4* 4.1  CL 93* 101 101 101  CO2 23 22 24 26   GLUCOSE 148* 92 93 99  BUN 13 14 10 8   CREATININE 0.56 0.52 0.41* 0.36*  CALCIUM 9.7 8.9 8.5* 8.4*  MG 1.3* 1.9  --   --   PHOS 4.3  --   --   --    Liver Function Tests: Recent Labs  Lab 02/27/22 0318 02/28/22 0518 03/01/22 0451 03/02/22 0433  AST 293* 507* 1,067* 835*  ALT 177* 376* 841* 1,035*  ALKPHOS 49 48 51 62  BILITOT 2.5* 2.1* 1.8* 1.7*  PROT 6.2* 5.9* 5.5* 5.7*  ALBUMIN 3.9 3.7 3.3* 3.3*   No results for input(s): LIPASE, AMYLASE in the last 168 hours. No results for input(s): AMMONIA in the last 168 hours. Coagulation Profile: Recent Labs  Lab 02/28/22 1617 03/01/22 1100 03/02/22 0822  INR 1.1 1.2 1.2   CBC: Recent Labs  Lab 02/27/22 0318 02/28/22 0518 03/01/22 0451 03/02/22 0822  WBC 11.0* 9.4 6.5 5.3  NEUTROABS 9.7*  --   --   --   HGB 10.2* 9.6* 8.2* 8.2*  HCT 27.7* 27.9* 23.4* 24.5*  MCV 97.5 102.2* 102.6* 103.8*  PLT 222 212 196 208   Cardiac Enzymes: Recent Labs  Lab 03/02/22 1510  CKTOTAL 112   BNP: Invalid input(s):  POCBNP CBG: No results for input(s): GLUCAP in the last 168 hours. HbA1C: No results for input(s): HGBA1C in the last 72 hours. Urine analysis:    Component Value Date/Time   COLORURINE YELLOW 02/27/2022 0823   APPEARANCEUR CLEAR 02/27/2022 0823   LABSPEC 1.029 02/27/2022 0823   PHURINE 6.0 02/27/2022 0823   GLUCOSEU NEGATIVE 02/27/2022 0823   HGBUR NEGATIVE 02/27/2022 0823   BILIRUBINUR NEGATIVE 02/27/2022 0823   BILIRUBINUR small 09/15/2017 1210   KETONESUR NEGATIVE 02/27/2022 0823   PROTEINUR NEGATIVE 02/27/2022 0823   UROBILINOGEN 0.2 09/15/2017 1210   NITRITE NEGATIVE 02/27/2022 0823   LEUKOCYTESUR NEGATIVE 02/27/2022 0823   Sepsis Labs: @LABRCNTIP (procalcitonin:4,lacticidven:4) ) Recent Results (from the past 240 hour(s))  Urine Culture     Status: Abnormal   Collection Time: 02/27/22  8:23 AM   Specimen: Urine, Clean Catch  Result Value Ref Range Status   Specimen Description   Final    URINE, CLEAN CATCH Performed at [facility], [address] [place],  [address]    Special Requests   Final  NONE Performed at [facility], [address] [place], [place] [address]    Culture MULTIPLE SPECIES PRESENT, SUGGEST RECOLLECTION (A)  Final   Report Status 03/01/2022 FINAL  Final     Scheduled Meds:  Chlorhexidine Gluconate Cloth  6 each Topical Daily   memantine  10 mg Oral q morning   metoprolol tartrate  25 mg Oral BID   pneumococcal 20-valent conjugate vaccine  0.5 mL Intramuscular Tomorrow-1000   polyethylene glycol  17 g Oral Daily   senna  2 tablet Oral Daily   sodium chloride  1 g Oral BID WC   traZODone  50 mg Oral QHS   Continuous Infusions:  Procedures/Studies: CT Head Wo Contrast  Result Date: 02/27/2022 CLINICAL DATA:  Worsening weakness and increased falls. EXAM: CT HEAD WITHOUT CONTRAST TECHNIQUE: Contiguous axial images were obtained from the base of the skull through the vertex without intravenous contrast. RADIATION DOSE REDUCTION:  This exam was performed according to the departmental dose-optimization program which includes automated exposure control, adjustment of the mA and/or kV according to patient size and/or use of iterative reconstruction technique. COMPARISON:  None Available. FINDINGS: Brain: There is mild cerebral atrophy with widening of the extra-axial spaces and ventricular dilatation. There are areas of decreased attenuation within the white matter tracts of the supratentorial brain, consistent with microvascular disease changes. Vascular: No hyperdense vessel or unexpected calcification. Skull: Normal. Negative for fracture or focal lesion. Sinuses/Orbits: No acute finding. Other: None. IMPRESSION: 1. No acute intracranial abnormality. 2. Generalized cerebral atrophy with chronic white matter small vessel ischemic changes. Electronically Signed   By: Virgina Norfolk M.D.   On: 02/27/2022 04:08   CT Shoulder Right Wo Contrast  Result Date: 02/27/2022 CLINICAL DATA:  Shoulder trauma, fracture of humerus or scapula EXAM: CT OF THE UPPER RIGHT EXTREMITY WITHOUT CONTRAST TECHNIQUE: Multidetector CT imaging of the upper right extremity was performed according to the standard protocol. RADIATION DOSE REDUCTION: This exam was performed according to the departmental dose-optimization program which includes automated exposure control, adjustment of the mA and/or kV according to patient size and/or use of iterative reconstruction technique. COMPARISON:  X-ray 02/27/2022, CT 08/05/2021 FINDINGS: Bones/Joint/Cartilage Acute heavily comminuted fracture of the right humeral head and neck. Transversely oriented component through the surgical neck which is anteromedially displaced relative to the humeral head component. The proximal humeral shaft is perched upon the anteroinferior glenoid rim (series 4, image 36). Humeral head fragment is inferiorly subluxed relative to the glenoid but not dislocated. Large 3.0 x 1.8 x 3.0 cm fracture  fragment involving the greater tuberosity is posteromedially displaced relative to the humeral head within the joint space. Numerous small comminuted fracture fragments surround the humeral head and neck. Bones appear demineralized and slightly heterogeneous and pathologic fracture of an underlying bone lesion is not excluded, particularly given history of cancer. Remaining osseous structures appear otherwise intact. AC joint within normal limits. Ligaments Suboptimally assessed by CT. Muscles and Tendons Presumed osseous avulsion of the rotator cuff tendons with displaced greater tuberosity fracture. Enlargement of the rotator cuff musculature likely related to intramuscular hematoma formation. Soft tissues Prominent soft tissue swelling about the shoulder and proximal upper arm. Emphysematous changes within the right lung. 6 mm subpleural nodule at the right lung apex is stable from 08/05/2021. Right chest port is in place. IMPRESSION: Acute heavily comminuted and displaced fracture of the right humeral head and neck, as described above. Bones appear demineralized and slightly heterogeneous and pathologic fracture of an underlying bone lesion  is not excluded, particularly given history of cancer. Electronically Signed   By: Davina Poke D.O.   On: 02/27/2022 09:11   DG Humerus Right  Result Date: 02/27/2022 CLINICAL DATA:  Increased falls. EXAM: RIGHT HUMERUS - 2+ VIEW COMPARISON:  None Available. FINDINGS: Acute, comminuted fracture deformity is seen involving the head and surgical neck of the proximal right humerus. Approximately 1 shaft width medial displacement of the distal fracture site is seen. Dislocation of the right humeral head cannot be excluded. Diffuse soft tissue swelling is noted. IMPRESSION: Acute, displaced fracture deformity involving the head and surgical neck of the proximal right humerus. Electronically Signed   By: Virgina Norfolk M.D.   On: 02/27/2022 02:46   US Abdomen Limited  RUQ (LIVER/GB)  Result Date: 02/27/2022 CLINICAL DATA:  Elevated LFTs for 6 months. EXAM: ULTRASOUND ABDOMEN LIMITED RIGHT UPPER QUADRANT COMPARISON:  None Available. FINDINGS: Gallbladder: No gallstones or wall thickening visualized. No sonographic Murphy sign noted by sonographer. Common bile duct: Diameter: 4.5 mm Liver: Diffuse increased echogenicity. No focal masses. Portal vein is patent on color Doppler imaging with normal direction of blood flow towards the liver. Other: None. IMPRESSION: 1. Diffuse increased echogenicity throughout the liver is nonspecific but often due to hepatic steatosis. No other abnormalities are identified. Electronically Signed   By: Dorise Bullion III M.D.   On: 02/27/2022 10:56    Orson Eva, DO  [facility]  If 7PM-7AM, please contact night-coverage [email or web address] Password [email or web address] 03/02/2022, 6:46 PM   LOS: 3 days

## 2022-03-02 NOTE — Progress Notes (Signed)
Physical Therapy Treatment Patient Details Name: Cynthia Kelley MRN: [id number] DOB: [birth year] Today's Date: 03/02/2022   History of Present Illness s/p fall with RUE proximal humerus fx-nonsurgical treatment. Pt has history of metastatic squamous cell carcinoma of the lung to the liver, paroxysmal atrial fibrillation, diastolic CHF, hypertension, chronic to impairment, hyponatremia, COPD, alcohol and tobacco abuse    PT Comments    Patient lying in bed on therapist arrival.  Husband present at bedside.  Therapist instructed patient in supine lower extremity therapeutic exercise to include ankle pumps, heel slides and hip abduction x 10 reps each.  Patient then sits up on edge of bed; slightly extra time needed and is able to don her shoes. Sit to stand with therapist close SBA to CGA; ambulation down the hallway and back x 150 ft with close SBA to occassional CGA for turns from therapist. Patient demonstrates no loss of balance or path deviation.  She demonstrates decreased arm swing due to sling Right arm.  Patient returns to the room and she sits in chair with legs elevated and her husband present. Patient will benefit from continued skilled therapy interventions during the remainder of her hospital stay and at the next recommended venue of care to address deficits and promote optimal function.     Recommendations for follow up therapy are one component of a multi-disciplinary discharge planning process, led by the attending physician.  Recommendations may be updated based on patient status, additional functional criteria and insurance authorization.  Follow Up Recommendations  Outpatient PT     Assistance Recommended at Discharge Intermittent Supervision/Assistance  Patient can return home with the following A little help with walking and/or transfers;A lot of help with bathing/dressing/bathroom;Assistance with cooking/housework;Help with stairs or ramp for entrance   Equipment  Recommendations  None recommended by PT    Recommendations for Other Services       Precautions / Restrictions Precautions Precautions: Fall;Shoulder Shoulder Interventions: Shoulder sling/immobilizer;At all times Required Braces or Orthoses: Sling Restrictions Weight Bearing Restrictions: Yes RUE Weight Bearing: Non weight bearing     Mobility  Bed Mobility Overal bed mobility: Modified Independent                  Transfers Overall transfer level: Modified independent Equipment used: None                    Ambulation/Gait Ambulation/Gait assistance: Modified independent (Device/Increase time) Gait Distance (Feet): 150 Feet Assistive device: None Gait Pattern/deviations: Step-through pattern           Stairs             Wheelchair Mobility    Modified Rankin (Stroke Patients Only)       Balance Overall balance assessment: Modified Independent                                          Cognition Arousal/Alertness: Awake/alert Behavior During Therapy: WFL for tasks assessed/performed Overall Cognitive Status: Within Functional Limits for tasks assessed                                 General Comments: appears to have some memory loss; does not recall fall        Exercises Other Exercises Other Exercises: supine ankle pumps, heel slides, hip ABDuction x 10 reps each  bilat    General Comments        Pertinent Vitals/Pain Pain Assessment Pain Assessment: 0-10 Pain Score: 6  Pain Location: Right shoulder    Home Living                          Prior Function            PT Goals (current goals can now be found in the care plan section) Acute Rehab PT Goals Patient Stated Goal: For pt to get stronger PT Goal Formulation: With patient Time For Goal Achievement: 03/13/22 Potential to Achieve Goals: Good Progress towards PT goals: Progressing toward goals    Frequency     Min 2X/week      PT Plan Current plan remains appropriate    Co-evaluation              AM-PAC PT "6 Clicks" Mobility   Outcome Measure  Help needed turning from your back to your side while in a flat bed without using bedrails?: A Little Help needed moving from lying on your back to sitting on the side of a flat bed without using bedrails?: None Help needed moving to and from a bed to a chair (including a wheelchair)?: None Help needed standing up from a chair using your arms (e.g., wheelchair or bedside chair)?: None Help needed to walk in hospital room?: None Help needed climbing 3-5 steps with a railing? : A Little 6 Click Score: 22    End of Session Equipment Utilized During Treatment: Other (comment) (sling Right arm) Activity Tolerance: Patient tolerated treatment well Patient left: in chair;with family/visitor present;with call bell/phone within reach Nurse Communication: Mobility status PT Visit Diagnosis: Unsteadiness on feet (R26.81);Pain;Muscle weakness (generalized) (M62.81);History of falling (Z91.81) Pain - Right/Left: Right Pain - part of body: Shoulder     Time: 9371-6967 PT Time Calculation (min) (ACUTE ONLY): 28 min  Charges:  $Therapeutic Exercise: 23-37 mins                     4:45 PM, 03/02/22 Nakiesha Rumsey Small Ahriana Gunkel MPT [facility] [place] [phone]

## 2022-03-03 DIAGNOSIS — D649 Anemia, unspecified: Secondary | ICD-10-CM

## 2022-03-03 DIAGNOSIS — F1010 Alcohol abuse, uncomplicated: Secondary | ICD-10-CM

## 2022-03-03 DIAGNOSIS — Z79899 Other long term (current) drug therapy: Secondary | ICD-10-CM

## 2022-03-03 LAB — HEPATIC FUNCTION PANEL
ALT: 702 U/L — ABNORMAL HIGH (ref 0–44)
AST: 295 U/L — ABNORMAL HIGH (ref 15–41)
Albumin: 3.5 g/dL (ref 3.5–5.0)
Alkaline Phosphatase: 72 U/L (ref 38–126)
Bilirubin, Direct: 0.6 mg/dL — ABNORMAL HIGH (ref 0.0–0.2)
Indirect Bilirubin: 1.1 mg/dL — ABNORMAL HIGH (ref 0.3–0.9)
Total Bilirubin: 1.7 mg/dL — ABNORMAL HIGH (ref 0.3–1.2)
Total Protein: 5.7 g/dL — ABNORMAL LOW (ref 6.5–8.1)

## 2022-03-03 LAB — MAGNESIUM: Magnesium: 1.8 mg/dL (ref 1.7–2.4)

## 2022-03-03 LAB — EBV AB TO VIRAL CAPSID AG PNL, IGG+IGM
EBV VCA IgG: >600 U/mL — ABNORMAL HIGH (ref 0.0–17.9)
EBV VCA IgM: <36 U/mL (ref 0.0–35.9)

## 2022-03-03 LAB — BASIC METABOLIC PANEL
Anion gap: 6 (ref 5–15)
BUN: 8 mg/dL (ref 8–23)
CO2: 28 mmol/L (ref 22–32)
Calcium: 9 mg/dL (ref 8.9–10.3)
Chloride: 99 mmol/L (ref 98–111)
Creatinine, Ser: 0.35 mg/dL — ABNORMAL LOW (ref 0.44–1.00)
GFR, Estimated: >60 mL/min (ref >60)
Glucose, Bld: 101 mg/dL — ABNORMAL HIGH (ref 70–99)
Potassium: 3.8 mmol/L (ref 3.5–5.1)
Sodium: 133 mmol/L — ABNORMAL LOW (ref 135–145)

## 2022-03-03 LAB — CMV IGM: CMV IgM: <30 AU/mL (ref 0.0–29.9)

## 2022-03-03 NOTE — Progress Notes (Signed)
Subjective: Patient's husband reports that he is concerned about why liver enzymes have been elevated, he states that she has been having episodes where she would try to get off the toilet and would just "collapse" patient reports that she cannot recall how she felt prior to these episodes. Husband reports that BP was on the low side with EMS. Husband states that patient has not been able to be as active recently and appetite has been decreased. Reports that she drinks 3 beers per day and 2 liquor drinks per day. States that she has actually been drinking less recently compared to a few months ago. Her husband reports that given she has been undergoing cancer treatment which has caused a lot of angst for her, she was drinking to help cope with this. They both understand that this is not good for her and in the long term will be detrimental.   Objective: Vital signs in last 24 hours: Temp:  [98 F (36.7 C)-98.3 F (36.8 C)] 98 F (36.7 C) (06/07 0448) Pulse Rate:  [70-73] 70 (06/07 0448) Resp:  [19-20] 19 (06/07 0448) BP: (130-155)/(60-71) 155/71 (06/07 0448) SpO2:  [98 %-100 %] 98 % (06/07 0448) Last BM Date : 02/25/22 General:   Alert and oriented, pleasant Head:  Normocephalic and atraumatic. Eyes:  No icterus, sclera clear. Conjuctiva pink.  Mouth:  Without lesions, mucosa pink and moist.  Heart:  S1, S2 present, no murmurs noted.  Lungs: Clear to auscultation bilaterally, without wheezing, rales, or rhonchi.  Abdomen:  Bowel sounds present, soft, non-tender, non-distended. No HSM or hernias noted. No rebound or guarding. No masses appreciated  Msk:  Symmetrical without gross deformities. Normal posture. Edema to R upper arm, arm remains in sling Pulses:  Normal pulses noted. Extremities:  Without clubbing or edema. Neurologic:  Alert and  oriented x4;  grossly normal neurologically. Skin:  Warm and dry, intact without significant lesions.  Psych:  Alert and cooperative. Normal mood  and affect.  Intake/Output from previous day: 06/06 0701 - 06/07 0700 In: 640 [P.O.:640] Out: 400 [Urine:400] Intake/Output this shift: No intake/output data recorded.  Lab Results: Recent Labs    03/01/22 0451 03/02/22 0822  WBC 6.5 5.3  HGB 8.2* 8.2*  HCT 23.4* 24.5*  PLT 196 208   BMET Recent Labs    03/01/22 0451 03/02/22 0433 03/03/22 0505  NA 129* 129* 133*  K 3.4* 4.1 3.8  CL 101 101 99  CO2 24 26 28   GLUCOSE 93 99 101*  BUN 10 8 8   CREATININE 0.41* 0.36* 0.35*  CALCIUM 8.5* 8.4* 9.0   LFT Recent Labs    03/01/22 0451 03/02/22 0433  PROT 5.5* 5.7*  ALBUMIN 3.3* 3.3*  AST 1,067* 835*  ALT 841* 1,035*  ALKPHOS 51 62  BILITOT 1.8* 1.7*   PT/INR Recent Labs    03/01/22 1100 03/02/22 0822  LABPROT 14.9 14.8  INR 1.2 1.2   Hepatitis Panel Recent Labs    02/28/22 1617  HEPBSAG NON REACTIVE  HCVAB NON REACTIVE  HEPAIGM NON REACTIVE  HEPBIGM NON REACTIVE   Assessment: Cynthia Kelley is a 66 year old female with a history of metastatic squamous cell carcinoma of lung, afib on Eliquis, heart failure, hypertension, hyponatremia, alcohol and tobacco use, presenting after fall at home and found to have severe fracture of right humeral head and neck. GI consulted due to acutely elevated LFTs.   Elevated LFTs:  Acutely elevated on admission with AST 293, ALT 177, total bilirubin 2.5,  previously within normal limits in June 2022. AST Peaked 2 days ago at 1067 and ALT peaked yesterday at 1035, though reassuringly AST 295 and ALT 702 today with T bili 1.7. No signs of HE. Acute Hep panel is negative. Ferritin elevation likely acute phase reactant as iron and iron sat are low. ANA negative, other AIH serologies, CMV, EBV, Hep B DNA, HCV RNA are pending. CK is normal. RUQ ultrasound with hepatic steatosis.  Portal vein patent. Outside CT on 5/9 with too small to characterize right liver hypodensities, stable compared to prior CT in February. Etiology suspected to be  multifactorial in setting of chronic ETOH use, tylenol use and on amiodarone (on hold). Query possible ischemic hepatitis given pattern and trend of LFTs, and in presence of possible syncope/orthostatic hypotension episodes prior to admission.   I discussed suspected etiology with the patient and husband as husband is very concerned about getting to the root cause of why her liver enzymes are elevated. He was reassured that there has been a significant decrease in her enzymes over the past 24 hours. We did discuss the importance of alcohol cessation and further evaluation regarding syncopal episodes/possible orthostatic hypotension as patient has had multiple episodes of "collapsing" when getting up off of the toilet recently. Patient and husband verbalized understanding of this.   Anemia: hgb previously 13.6 on 02/02/22, iron and iron sat low this admission, ferritin likely elevated as acute phase reactant, B12 and folate WNL. No overt GI bleeding, last colonoscopy in 2006 was normal. No previous EGD. Will consider outpatient endoscopic evaluation as long as no significant decline in hgb or overt GI bleeding during admission.   Plan: Follow Hepatitis, CMV, EBV and AIH serologies Trend LFTs and INR daily Monitor for signs of HE Alcohol cessation Avoid hepatotoxic medications Trend H&H daily, monitor for overt GI bleeding Consider outpatient endoscopic evaluation of anemia unless significant decline in hgb or overt bleeding occurs   LOS: 4 days    03/03/2022, 9:24 AM   Kaelynn Igo L. Alver Sorrow, MSN, APRN, AGNP-C Adult-Gerontology Nurse Practitioner [facility]

## 2022-03-03 NOTE — Progress Notes (Signed)
PROGRESS NOTE  Cynthia Kelley SNK:[id number] DOB: 10-02-1955 DOA: 02/27/2022 PCP: Lindell Spar, MD  Brief History:  66 year old female with a history of metastatic squamous cell carcinoma of the lung to the liver, paroxysmal atrial fibrillation, diastolic CHF, hypertension, chronic to impairment, hyponatremia, COPD, alcohol and tobacco abuse presenting with generalized weakness and falls.  Apparently, the patient had a mechanical fall on the evening of 02/26/2022.  She stated that she slipped on a rug and fell onto her right side.  She denied hitting her head or loss of consciousness.  She denies any new medications.  However, she states that she continues to drink alcohol on a daily basis.  She states that she drinks 2 beers and 3 mixed drinks on a daily basis.  She continues to smoke 1 pack/day. Patient denies fevers, chills, headache, chest pain, dyspnea, nausea, vomiting, diarrhea, abdominal pain, dysuria, hematuria, hematochezia, and melena. In the ED, the patient was afebrile and hemodynamically stable with oxygen saturation 96% on room air.  BMP showed a sodium 123, potassium 3.2, bicarbonate 23, serum creatinine 0.56.  AST 293, ALT to 177, total bilirubin 2.5, magnesium 1.3.  WBC 11.0, hemoglobin 10.3, platelets 222,000.  EKG shows sinus rhythm with nonspecific T wave changes.  CT of the brain showed generalized atrophy.  X-ray of the right shoulder showed an acute displaced fracture of the right humerus head and surgical neck.  Orthopedics was consulted.     Assessment and Plan: * Humeral surgical neck fracture Orthopedics consulted --remain in sling for now --Dr. Amedeo Kinsman saw patient --pt and spouse opted for nonoperative management --continue judicious opioids -Reports they have an appointment with Dr. Marlou Sa, orthopedics in [place] on 6/9  Chronic alcohol abuse No signs of withdrawal  Paroxysmal atrial fibrillation (HCC) Currently in sinus rhythm Holding amiodarone  secondary to elevated LFTs Holding apixaban in preparation for orthopedic intervention initially and now with concerns of possible hepatic failure Restart metoprolol succinate at lower dose   Hypomagnesemia Replete  Dementia without behavioral disturbance ([place]) Continue Namenda  Transaminitis RUQ ultrasound--hepatic steatosis Hold amiodarone LFTs continue to trend up AST 1067>>295, ALT 841>>702 GI consult appreciated  Fall at home, initial encounter PT eval>>outpt PT  Hypokalemia Replete Mag 1.9  Hyponatremia This has been chronic and multifactorial -Including chronic alcohol use, poor solute intake, SIADH, CHF -Continue home dose sodium chloride tablets, 1 g twice daily -Baseline sodium 125-130 Presented with sodium 123 Urine osmolarity--565 Serum osmolarity--261 FeNa 0.1% Na back to baseline after isotonic saline  Anxiety PDMP reviewed -Last prescription for Ativan 1 mg, #30 on 9/37/9024  Chronic diastolic CHF (congestive heart failure) ([place]) Appears clinically euvolemic Daily weights Hold home dose furosemide temporarily  Lung cancer, primary, with metastasis from lung to other site [facility]) Last follow-up 02/02/2022 at [facility] -- Status post chemoradiation -- 02/02/2022 CT abdomen pelvis and chest did not show any new metastasis; there was stable pulmonary nodules -- Currently under ongoing surveillance with instructions to follow-up in 3 months  Tobacco abuse Tobacco cessation discussed    Family Communication:  spouse updated 6/7 Consultants:  ortho, GI   Code Status:  DNR   DVT Prophylaxis:  SCDs     Procedures: As Listed in Progress Note Above   Antibiotics: None      Subjective: Continues to have pain in her right shoulder   Objective: Vitals:   03/02/22 1400 03/02/22 2103 03/03/22 0448 03/03/22 1350  BP: 130/70 (!) 145/60 (!) 155/71 Marland Kitchen)  108/58  Pulse: 71 73 70 68  Resp:  20 19 16   Temp: 98 F (36.7 C) 98.3 F (36.8 C) 98 F (36.7  C) 98.2 F (36.8 C)  TempSrc: Oral Oral  Oral  SpO2: 98% 100% 98% 100%  Weight:      Height:        Intake/Output Summary (Last 24 hours) at 03/03/2022 1934 Last data filed at 03/03/2022 1900 Gross per 24 hour  Intake 880 ml  Output 400 ml  Net 480 ml   Weight change:  Exam:  General:  Pt is alert, follows commands appropriately, not in acute distress HEENT: No icterus, No thrush, No neck mass, [place]/AT Cardiovascular: RRR, S1/S2, no rubs, no gallops Respiratory: CTA bilaterally, no wheezing, no crackles, no rhonchi Abdomen: Soft/+BS, non tender, non distended, no guarding Extremities: No edema, No lymphangitis, No petechiae, No rashes, no synovitis   Data Reviewed: I have personally reviewed following labs and imaging studies Basic Metabolic Panel: Recent Labs  Lab 02/27/22 0318 02/28/22 0518 03/01/22 0451 03/02/22 0433 03/03/22 0505  NA 123* 130* 129* 129* 133*  K 3.2* 3.7 3.4* 4.1 3.8  CL 93* 101 101 101 99  CO2 23 22 24 26 28   GLUCOSE 148* 92 93 99 101*  BUN 13 14 10 8 8   CREATININE 0.56 0.52 0.41* 0.36* 0.35*  CALCIUM 9.7 8.9 8.5* 8.4* 9.0  MG 1.3* 1.9  --   --  1.8  PHOS 4.3  --   --   --   --    Liver Function Tests: Recent Labs  Lab 02/27/22 0318 02/28/22 0518 03/01/22 0451 03/02/22 0433 03/03/22 0902  AST 293* 507* 1,067* 835* 295*  ALT 177* 376* 841* 1,035* 702*  ALKPHOS 49 48 51 62 72  BILITOT 2.5* 2.1* 1.8* 1.7* 1.7*  PROT 6.2* 5.9* 5.5* 5.7* 5.7*  ALBUMIN 3.9 3.7 3.3* 3.3* 3.5   No results for input(s): LIPASE, AMYLASE in the last 168 hours. No results for input(s): AMMONIA in the last 168 hours. Coagulation Profile: Recent Labs  Lab 02/28/22 1617 03/01/22 1100 03/02/22 0822  INR 1.1 1.2 1.2   CBC: Recent Labs  Lab 02/27/22 0318 02/28/22 0518 03/01/22 0451 03/02/22 0822  WBC 11.0* 9.4 6.5 5.3  NEUTROABS 9.7*  --   --   --   HGB 10.2* 9.6* 8.2* 8.2*  HCT 27.7* 27.9* 23.4* 24.5*  MCV 97.5 102.2* 102.6* 103.8*  PLT 222 212 196  208   Cardiac Enzymes: Recent Labs  Lab 03/02/22 1510  CKTOTAL 112   BNP: Invalid input(s): POCBNP CBG: No results for input(s): GLUCAP in the last 168 hours. HbA1C: No results for input(s): HGBA1C in the last 72 hours. Urine analysis:    Component Value Date/Time   COLORURINE YELLOW 02/27/2022 0823   APPEARANCEUR CLEAR 02/27/2022 0823   LABSPEC 1.029 02/27/2022 0823   PHURINE 6.0 02/27/2022 0823   GLUCOSEU NEGATIVE 02/27/2022 0823   HGBUR NEGATIVE 02/27/2022 0823   BILIRUBINUR NEGATIVE 02/27/2022 0823   BILIRUBINUR small 09/15/2017 1210   KETONESUR NEGATIVE 02/27/2022 0823   PROTEINUR NEGATIVE 02/27/2022 0823   UROBILINOGEN 0.2 09/15/2017 1210   NITRITE NEGATIVE 02/27/2022 0823   LEUKOCYTESUR NEGATIVE 02/27/2022 0823   Sepsis Labs: @LABRCNTIP (procalcitonin:4,lacticidven:4) ) Recent Results (from the past 240 hour(s))  Urine Culture     Status: Abnormal   Collection Time: 02/27/22  8:23 AM   Specimen: Urine, Clean Catch  Result Value Ref Range Status   Specimen Description   Final  URINE, CLEAN CATCH Performed at [facility], [address], [place], [place] [address]    Special Requests   Final    NONE Performed at [facility], [address] [place], [place] [address]    Culture MULTIPLE SPECIES PRESENT, SUGGEST RECOLLECTION (A)  Final   Report Status 03/01/2022 FINAL  Final     Scheduled Meds:  Chlorhexidine Gluconate Cloth  6 each Topical Daily   memantine  10 mg Oral q morning   metoprolol tartrate  25 mg Oral BID   pneumococcal 20-valent conjugate vaccine  0.5 mL Intramuscular Tomorrow-1000   polyethylene glycol  17 g Oral Daily   senna  2 tablet Oral Daily   sodium chloride  1 g Oral BID WC   traZODone  50 mg Oral QHS   Continuous Infusions:  Procedures/Studies: CT Head Wo Contrast  Result Date: 02/27/2022 CLINICAL DATA:  Worsening weakness and increased falls. EXAM: CT HEAD WITHOUT CONTRAST TECHNIQUE: Contiguous axial images were obtained  from the base of the skull through the vertex without intravenous contrast. RADIATION DOSE REDUCTION: This exam was performed according to the departmental dose-optimization program which includes automated exposure control, adjustment of the mA and/or kV according to patient size and/or use of iterative reconstruction technique. COMPARISON:  None Available. FINDINGS: Brain: There is mild cerebral atrophy with widening of the extra-axial spaces and ventricular dilatation. There are areas of decreased attenuation within the white matter tracts of the supratentorial brain, consistent with microvascular disease changes. Vascular: No hyperdense vessel or unexpected calcification. Skull: Normal. Negative for fracture or focal lesion. Sinuses/Orbits: No acute finding. Other: None. IMPRESSION: 1. No acute intracranial abnormality. 2. Generalized cerebral atrophy with chronic white matter small vessel ischemic changes. Electronically Signed   By: Virgina Norfolk M.D.   On: 02/27/2022 04:08   CT Shoulder Right Wo Contrast  Result Date: 02/27/2022 CLINICAL DATA:  Shoulder trauma, fracture of humerus or scapula EXAM: CT OF THE UPPER RIGHT EXTREMITY WITHOUT CONTRAST TECHNIQUE: Multidetector CT imaging of the upper right extremity was performed according to the standard protocol. RADIATION DOSE REDUCTION: This exam was performed according to the departmental dose-optimization program which includes automated exposure control, adjustment of the mA and/or kV according to patient size and/or use of iterative reconstruction technique. COMPARISON:  X-ray 02/27/2022, CT 08/05/2021 FINDINGS: Bones/Joint/Cartilage Acute heavily comminuted fracture of the right humeral head and neck. Transversely oriented component through the surgical neck which is anteromedially displaced relative to the humeral head component. The proximal humeral shaft is perched upon the anteroinferior glenoid rim (series 4, image 36). Humeral head fragment is  inferiorly subluxed relative to the glenoid but not dislocated. Large 3.0 x 1.8 x 3.0 cm fracture fragment involving the greater tuberosity is posteromedially displaced relative to the humeral head within the joint space. Numerous small comminuted fracture fragments surround the humeral head and neck. Bones appear demineralized and slightly heterogeneous and pathologic fracture of an underlying bone lesion is not excluded, particularly given history of cancer. Remaining osseous structures appear otherwise intact. AC joint within normal limits. Ligaments Suboptimally assessed by CT. Muscles and Tendons Presumed osseous avulsion of the rotator cuff tendons with displaced greater tuberosity fracture. Enlargement of the rotator cuff musculature likely related to intramuscular hematoma formation. Soft tissues Prominent soft tissue swelling about the shoulder and proximal upper arm. Emphysematous changes within the right lung. 6 mm subpleural nodule at the right lung apex is stable from 08/05/2021. Right chest port is in place. IMPRESSION: Acute heavily comminuted and displaced  fracture of the right humeral head and neck, as described above. Bones appear demineralized and slightly heterogeneous and pathologic fracture of an underlying bone lesion is not excluded, particularly given history of cancer. Electronically Signed   By: Davina Poke D.O.   On: 02/27/2022 09:11   DG Humerus Right  Result Date: 02/27/2022 CLINICAL DATA:  Increased falls. EXAM: RIGHT HUMERUS - 2+ VIEW COMPARISON:  None Available. FINDINGS: Acute, comminuted fracture deformity is seen involving the head and surgical neck of the proximal right humerus. Approximately 1 shaft width medial displacement of the distal fracture site is seen. Dislocation of the right humeral head cannot be excluded. Diffuse soft tissue swelling is noted. IMPRESSION: Acute, displaced fracture deformity involving the head and surgical neck of the proximal right humerus.  Electronically Signed   By: Virgina Norfolk M.D.   On: 02/27/2022 02:46   US Abdomen Limited RUQ (LIVER/GB)  Result Date: 02/27/2022 CLINICAL DATA:  Elevated LFTs for 6 months. EXAM: ULTRASOUND ABDOMEN LIMITED RIGHT UPPER QUADRANT COMPARISON:  None Available. FINDINGS: Gallbladder: No gallstones or wall thickening visualized. No sonographic Murphy sign noted by sonographer. Common bile duct: Diameter: 4.5 mm Liver: Diffuse increased echogenicity. No focal masses. Portal vein is patent on color Doppler imaging with normal direction of blood flow towards the liver. Other: None. IMPRESSION: 1. Diffuse increased echogenicity throughout the liver is nonspecific but often due to hepatic steatosis. No other abnormalities are identified. Electronically Signed   By: Dorise Bullion III M.D.   On: 02/27/2022 10:56    Kathie Dike, MD  [facility]  If 7PM-7AM, please contact night-coverage [email or web address]  03/03/2022, 7:34 PM   LOS: 4 days

## 2022-03-03 NOTE — Plan of Care (Signed)

## 2022-03-03 NOTE — Progress Notes (Signed)
Physical Therapy Treatment Patient Details Name: Cynthia Kelley MRN: [id number] DOB: [birth year] Today's Date: 03/03/2022   History of Present Illness s/p fall with RUE proximal humerus fx-nonsurgical treatment. Pt has history of metastatic squamous cell carcinoma of the lung to the liver, paroxysmal atrial fibrillation, diastolic CHF, hypertension, chronic to impairment, hyponatremia, COPD, alcohol and tobacco abuse    PT Comments    Patient doing well. No reported shoulder pain at rest. Patient Mod I with bed mobility and ambulation. Patient able to ambulate 200 feet in hallway and 2 stairs using single rail with no LOB or reported distress. Returned to room, patient in bed with HOB elevated, phone and call bell in reach.  No ongoing acute PT needed, acute therapist signing off for regular mobility with nursing staff.     Recommendations for follow up therapy are one component of a multi-disciplinary discharge planning process, led by the attending physician.  Recommendations may be updated based on patient status, additional functional criteria and insurance authorization.  Follow Up Recommendations  Outpatient PT     Assistance Recommended at Discharge Intermittent Supervision/Assistance  Patient can return home with the following A little help with walking and/or transfers;A lot of help with bathing/dressing/bathroom;Assistance with cooking/housework;Help with stairs or ramp for entrance   Equipment Recommendations  None recommended by PT    Recommendations for Other Services       Precautions / Restrictions Precautions Precautions: Fall;Shoulder Shoulder Interventions: Shoulder sling/immobilizer;At all times Required Braces or Orthoses: Sling Restrictions Weight Bearing Restrictions: Yes RUE Weight Bearing: Non weight bearing     Mobility  Bed Mobility Overal bed mobility: Modified Independent                  Transfers Overall transfer level: Modified  independent Equipment used: None                    Ambulation/Gait Ambulation/Gait assistance: Modified independent (Device/Increase time) Gait Distance (Feet): 200 Feet Assistive device: None Gait Pattern/deviations: Decreased stride length       General Gait Details: decreased   Stairs Stairs: Yes Stairs assistance: Modified independent (Device/Increase time) Stair Management: One rail Left Number of Stairs: 2     Wheelchair Mobility    Modified Rankin (Stroke Patients Only)       Balance Overall balance assessment: Modified Independent                                          Cognition Arousal/Alertness: Awake/alert Behavior During Therapy: WFL for tasks assessed/performed Overall Cognitive Status: Within Functional Limits for tasks assessed                                 General Comments: appears to have some short term memory loss        Exercises General Exercises - Lower Extremity Ankle Circles/Pumps: AROM, 20 reps, Seated Long Arc Quad: Strengthening, 20 reps, Seated Hip Flexion/Marching: Strengthening, 20 reps, Seated    General Comments        Pertinent Vitals/Pain Pain Assessment Pain Assessment: No/denies pain    Home Living                          Prior Function            PT  Goals (current goals can now be found in the care plan section) Acute Rehab PT Goals Patient Stated Goal: For pt to get stronger PT Goal Formulation: With patient Time For Goal Achievement: 03/13/22 Potential to Achieve Goals: Good Progress towards PT goals: Progressing toward goals    Frequency           PT Plan      Co-evaluation              AM-PAC PT "6 Clicks" Mobility   Outcome Measure  Help needed turning from your back to your side while in a flat bed without using bedrails?: None Help needed moving from lying on your back to sitting on the side of a flat bed without using  bedrails?: None Help needed moving to and from a bed to a chair (including a wheelchair)?: None Help needed standing up from a chair using your arms (e.g., wheelchair or bedside chair)?: None Help needed to walk in hospital room?: None Help needed climbing 3-5 steps with a railing? : A Little 6 Click Score: 23    End of Session Equipment Utilized During Treatment: Other (comment) Activity Tolerance: Patient tolerated treatment well Patient left: in bed;with call bell/phone within reach Nurse Communication: Mobility status PT Visit Diagnosis: Unsteadiness on feet (R26.81);Pain;Muscle weakness (generalized) (M62.81);History of falling (Z91.81) Pain - Right/Left: Right Pain - part of body: Shoulder     Time: 0140-0200 PT Time Calculation (min) (ACUTE ONLY): 20 min  Charges:  $Therapeutic Activity: 8-22 mins            2:30 PM, 03/03/22 Josue Hector PT DPT  Physical Therapist with [facility]  [phone]

## 2022-03-04 ENCOUNTER — Telehealth: Payer: Self-pay | Admitting: Gastroenterology

## 2022-03-04 LAB — HEPATIC FUNCTION PANEL
ALT: 466 U/L — ABNORMAL HIGH (ref 0–44)
AST: 124 U/L — ABNORMAL HIGH (ref 15–41)
Albumin: 3.2 g/dL — ABNORMAL LOW (ref 3.5–5.0)
Alkaline Phosphatase: 66 U/L (ref 38–126)
Bilirubin, Direct: 0.6 mg/dL — ABNORMAL HIGH (ref 0.0–0.2)
Indirect Bilirubin: 1 mg/dL — ABNORMAL HIGH (ref 0.3–0.9)
Total Bilirubin: 1.6 mg/dL — ABNORMAL HIGH (ref 0.3–1.2)
Total Protein: 5.6 g/dL — ABNORMAL LOW (ref 6.5–8.1)

## 2022-03-04 LAB — ANTI-SMOOTH MUSCLE ANTIBODY, IGG: F-Actin IgG: 4 Units (ref 0–19)

## 2022-03-04 LAB — IMMUNOGLOBULINS A/E/G/M, SERUM
IgA: 263 mg/dL (ref 87–352)
IgE (Immunoglobulin E), Serum: 26 IU/mL (ref 6–495)
IgG (Immunoglobin G), Serum: 578 mg/dL — ABNORMAL LOW (ref 586–1602)
IgM (Immunoglobulin M), Srm: 67 mg/dL (ref 26–217)

## 2022-03-04 LAB — HEMOGLOBIN AND HEMATOCRIT, BLOOD
HCT: 26.8 % — ABNORMAL LOW (ref 36.0–46.0)
Hemoglobin: 8.9 g/dL — ABNORMAL LOW (ref 12.0–15.0)

## 2022-03-04 LAB — MITOCHONDRIAL ANTIBODIES: Mitochondrial M2 Ab, IgG: <20 Units (ref 0.0–20.0)

## 2022-03-04 LAB — HEPATITIS B DNA, ULTRAQUANTITATIVE, PCR
HBV DNA SERPL PCR-ACNC: NOT DETECTED IU/mL
HBV DNA SERPL PCR-LOG IU: UNDETERMINED log10 IU/mL

## 2022-03-04 MED ORDER — POLYETHYLENE GLYCOL 3350 17 G PO PACK: 17.0000 g | PACK | Freq: Every day | ORAL | 0 refills | Status: DC

## 2022-03-04 MED ORDER — FUROSEMIDE 20 MG PO TABS: 20.0000 mg | ORAL_TABLET | Freq: Every day | ORAL | Status: DC | PRN

## 2022-03-04 MED ORDER — METOPROLOL TARTRATE 25 MG PO TABS: 25.0000 mg | ORAL_TABLET | Freq: Two times a day (BID) | ORAL | 0 refills | Status: DC

## 2022-03-04 MED ORDER — OXYCODONE HCL 5 MG PO TABS
5.0000 mg | ORAL_TABLET | Freq: Four times a day (QID) | ORAL | 0 refills | Status: DC | PRN
Start: 2022-03-04 — End: 2022-03-26

## 2022-03-04 NOTE — Discharge Summary (Signed)
Physician Discharge Summary  Cynthia Kelley EXB:[id number] DOB: 1956-08-22 DOA: 02/27/2022  PCP: Lindell Spar, MD  Admit date: 02/27/2022 Discharge date: 03/04/2022  Admitted From: Home Disposition: Home  Recommendations for Outpatient Follow-up:  Follow up with PCP in 1-2 weeks Please obtain BMP/CBC in one week Plans are to follow-up with GI She has outpatient appointment with orthopedics 6/9   Discharge Condition: Stable CODE STATUS: DNR Diet recommendation: Heart healthy  Brief/Interim Summary: 66 year old female with a history of metastatic squamous cell carcinoma of the lung to the liver, paroxysmal atrial fibrillation, diastolic CHF, hypertension, chronic to impairment, hyponatremia, COPD, alcohol and tobacco abuse presenting with generalized weakness and falls.  Apparently, the patient had a mechanical fall on the evening of 02/26/2022.  She stated that she slipped on a rug and fell onto her right side.  She denied hitting her head or loss of consciousness.  She denies any new medications.  However, she states that she continues to drink alcohol on a daily basis.  She states that she drinks 2 beers and 3 mixed drinks on a daily basis.  She continues to smoke 1 pack/day. Patient denies fevers, chills, headache, chest pain, dyspnea, nausea, vomiting, diarrhea, abdominal pain, dysuria, hematuria, hematochezia, and melena. In the ED, the patient was afebrile and hemodynamically stable with oxygen saturation 96% on room air.  BMP showed a sodium 123, potassium 3.2, bicarbonate 23, serum creatinine 0.56.  AST 293, ALT to 177, total bilirubin 2.5, magnesium 1.3.  WBC 11.0, hemoglobin 10.3, platelets 222,000.  EKG shows sinus rhythm with nonspecific T wave changes.  CT of the brain showed generalized atrophy.  X-ray of the right shoulder showed an acute displaced fracture of the right humerus head and surgical neck.  Orthopedics was consulted.   Discharge Diagnoses:  Principal Problem:   Humeral  surgical neck fracture Active Problems:   Tobacco abuse   Lung cancer, primary, with metastasis from lung to other site [facility])   Chronic diastolic CHF (congestive heart failure) (HCC)   Anxiety   Hyponatremia   Hypokalemia   Anemia   Fall at home, initial encounter   Transaminitis   Dementia without behavioral disturbance (HCC)   Hypomagnesemia   Paroxysmal atrial fibrillation (HCC)   Chronic alcohol abuse   Long term current use of high dose acetaminophen   Elevated liver enzymes  Humeral surgical neck fracture Orthopedics consulted --remain in sling for now --Dr. Amedeo Kinsman saw patient --pt and spouse opted for nonoperative management --continue judicious opioids -Reports they have an appointment with Dr. Marlou Sa, orthopedics in [place] on 6/9   Chronic alcohol abuse No signs of withdrawal   Paroxysmal atrial fibrillation (HCC) Currently in sinus rhythm Heart rate stable on amiodarone She is anticoagulated with apixaban Restart metoprolol succinate at lower dose     Hypomagnesemia Replete   Dementia without behavioral disturbance ([place]) Continue Namenda   Transaminitis RUQ ultrasound--hepatic steatosis LFTs continue to trend down AST 1067>>124, ALT 841>>466 GI consult appreciated -Felt that her LFT elevation may be related to transient ischemia   Fall at home, initial encounter PT eval>>outpt PT   Hypokalemia Replete Mag 1.9   Hyponatremia This has been chronic and multifactorial -Including chronic alcohol use, poor solute intake, SIADH, CHF -Continue home dose sodium chloride tablets, 1 g twice daily -Baseline sodium 125-130 Presented with sodium 123 Urine osmolarity--565 Serum osmolarity--261 FeNa 0.1% Na back to baseline after isotonic saline   Anxiety PDMP reviewed -Last prescription for Ativan 1 mg, #30 on 10/06/2021  Chronic diastolic CHF (congestive heart failure) (HCC) Appears clinically euvolemic Daily weights Hold home dose furosemide  temporarily   Lung cancer, primary, with metastasis from lung to other site [facility]) Last follow-up 02/02/2022 at [facility] -- Status post chemoradiation -- 02/02/2022 CT abdomen pelvis and chest did not show any new metastasis; there was stable pulmonary nodules -- Currently under ongoing surveillance with instructions to follow-up in 3 months   Tobacco abuse Tobacco cessation discussed  Discharge Instructions  Discharge Instructions     Ambulatory referral to Physical Therapy   Complete by: As directed    Diet - low sodium heart healthy   Complete by: As directed    Increase activity slowly   Complete by: As directed       Allergies as of 03/04/2022   No Known Allergies      Medication List     STOP taking these medications    metoprolol 200 MG 24 hr tablet Commonly known as: TOPROL-XL       TAKE these medications    amiodarone 200 MG tablet Commonly known as: PACERONE Take 200 mg by mouth daily.   apixaban 5 MG Tabs tablet Commonly known as: ELIQUIS Take 5 mg by mouth 2 (two) times daily.   furosemide 20 MG tablet Commonly known as: LASIX Take 1 tablet (20 mg total) by mouth daily as needed for fluid.   memantine 10 MG tablet Commonly known as: NAMENDA Take 10 mg by mouth every morning.   metoprolol tartrate 25 MG tablet Commonly known as: LOPRESSOR Take 1 tablet (25 mg total) by mouth 2 (two) times daily.   multivitamin with minerals tablet Take 1 tablet by mouth daily.   oxyCODONE 5 MG immediate release tablet Commonly known as: Oxy IR/ROXICODONE Take 1 tablet (5 mg total) by mouth every 6 (six) hours as needed for moderate pain or severe pain.   polyethylene glycol 17 g packet Commonly known as: MIRALAX / GLYCOLAX Take 17 g by mouth daily. Start taking on: March 05, 2022   potassium chloride 10 MEQ tablet Commonly known as: KLOR-CON Take 10 mEq by mouth 2 (two) times daily.   sodium chloride 1 g tablet Take 1 tablet (1 g total) by mouth 2 (two)  times daily with a meal. What changed: when to take this   traZODone 50 MG tablet Commonly known as: DESYREL Take 50 mg by mouth at bedtime.               Durable Medical Equipment  (From admission, onward)           Start     Ordered   03/01/22 1102  For home use only DME 3 n 1  Once        03/01/22 1101            Follow-up Information     Dean, Tonna Corner, MD Follow up.   Specialty: Orthopedic Surgery Why: call for follow up orthopedic appointment Contact information: [place] [address] [phone]         Gastroenterology office will contact you for follow up for endoscopy and colonoscopy Follow up.                 No Known Allergies  Consultations: Gastroenterology   Procedures/Studies: US Abdomen Limited RUQ (LIVER/GB)  Result Date: 02/27/2022 CLINICAL DATA:  Elevated LFTs for 6 months. EXAM: ULTRASOUND ABDOMEN LIMITED RIGHT UPPER QUADRANT COMPARISON:  None Available. FINDINGS: Gallbladder: No gallstones or wall thickening  visualized. No sonographic Murphy sign noted by sonographer. Common bile duct: Diameter: 4.5 mm Liver: Diffuse increased echogenicity. No focal masses. Portal vein is patent on color Doppler imaging with normal direction of blood flow towards the liver. Other: None. IMPRESSION: 1. Diffuse increased echogenicity throughout the liver is nonspecific but often due to hepatic steatosis. No other abnormalities are identified. Electronically Signed   By: Dorise Bullion III M.D.   On: 02/27/2022 10:56   CT Shoulder Right Wo Contrast  Result Date: 02/27/2022 CLINICAL DATA:  Shoulder trauma, fracture of humerus or scapula EXAM: CT OF THE UPPER RIGHT EXTREMITY WITHOUT CONTRAST TECHNIQUE: Multidetector CT imaging of the upper right extremity was performed according to the standard protocol. RADIATION DOSE REDUCTION: This exam was performed according to the departmental dose-optimization program which includes  automated exposure control, adjustment of the mA and/or kV according to patient size and/or use of iterative reconstruction technique. COMPARISON:  X-ray 02/27/2022, CT 08/05/2021 FINDINGS: Bones/Joint/Cartilage Acute heavily comminuted fracture of the right humeral head and neck. Transversely oriented component through the surgical neck which is anteromedially displaced relative to the humeral head component. The proximal humeral shaft is perched upon the anteroinferior glenoid rim (series 4, image 36). Humeral head fragment is inferiorly subluxed relative to the glenoid but not dislocated. Large 3.0 x 1.8 x 3.0 cm fracture fragment involving the greater tuberosity is posteromedially displaced relative to the humeral head within the joint space. Numerous small comminuted fracture fragments surround the humeral head and neck. Bones appear demineralized and slightly heterogeneous and pathologic fracture of an underlying bone lesion is not excluded, particularly given history of cancer. Remaining osseous structures appear otherwise intact. AC joint within normal limits. Ligaments Suboptimally assessed by CT. Muscles and Tendons Presumed osseous avulsion of the rotator cuff tendons with displaced greater tuberosity fracture. Enlargement of the rotator cuff musculature likely related to intramuscular hematoma formation. Soft tissues Prominent soft tissue swelling about the shoulder and proximal upper arm. Emphysematous changes within the right lung. 6 mm subpleural nodule at the right lung apex is stable from 08/05/2021. Right chest port is in place. IMPRESSION: Acute heavily comminuted and displaced fracture of the right humeral head and neck, as described above. Bones appear demineralized and slightly heterogeneous and pathologic fracture of an underlying bone lesion is not excluded, particularly given history of cancer. Electronically Signed   By: Davina Poke D.O.   On: 02/27/2022 09:11   CT Head Wo  Contrast  Result Date: 02/27/2022 CLINICAL DATA:  Worsening weakness and increased falls. EXAM: CT HEAD WITHOUT CONTRAST TECHNIQUE: Contiguous axial images were obtained from the base of the skull through the vertex without intravenous contrast. RADIATION DOSE REDUCTION: This exam was performed according to the departmental dose-optimization program which includes automated exposure control, adjustment of the mA and/or kV according to patient size and/or use of iterative reconstruction technique. COMPARISON:  None Available. FINDINGS: Brain: There is mild cerebral atrophy with widening of the extra-axial spaces and ventricular dilatation. There are areas of decreased attenuation within the white matter tracts of the supratentorial brain, consistent with microvascular disease changes. Vascular: No hyperdense vessel or unexpected calcification. Skull: Normal. Negative for fracture or focal lesion. Sinuses/Orbits: No acute finding. Other: None. IMPRESSION: 1. No acute intracranial abnormality. 2. Generalized cerebral atrophy with chronic white matter small vessel ischemic changes. Electronically Signed   By: Virgina Norfolk M.D.   On: 02/27/2022 04:08   DG Humerus Right  Result Date: 02/27/2022 CLINICAL DATA:  Increased falls. EXAM: RIGHT HUMERUS -  2+ VIEW COMPARISON:  None Available. FINDINGS: Acute, comminuted fracture deformity is seen involving the head and surgical neck of the proximal right humerus. Approximately 1 shaft width medial displacement of the distal fracture site is seen. Dislocation of the right humeral head cannot be excluded. Diffuse soft tissue swelling is noted. IMPRESSION: Acute, displaced fracture deformity involving the head and surgical neck of the proximal right humerus. Electronically Signed   By: Virgina Norfolk M.D.   On: 02/27/2022 02:46      Subjective: Continues to have pain in her shoulder.  She had 2 bowel movements earlier today.  Discharge Exam: Vitals:   03/03/22  2015 03/03/22 2045 03/04/22 0402 03/04/22 1407  BP: (!) 129/37 (!) 124/55 (!) 143/57 (!) 100/45  Pulse: 73 66 66 64  Resp: 20 14 18 16   Temp: 98.1 F (36.7 C) 98.4 F (36.9 C) 98 F (36.7 C) 98.3 F (36.8 C)  TempSrc: Oral Oral  Oral  SpO2: 100% 96% 100% 100%  Weight:      Height:        General: Pt is alert, awake, not in acute distress Cardiovascular: RRR, S1/S2 +, no rubs, no gallops Respiratory: CTA bilaterally, no wheezing, no rhonchi Abdominal: Soft, NT, ND, bowel sounds + Extremities: no edema, no cyanosis    The results of significant diagnostics from this hospitalization (including imaging, microbiology, ancillary and laboratory) are listed below for reference.     Microbiology: Recent Results (from the past 240 hour(s))  Urine Culture     Status: Abnormal   Collection Time: 02/27/22  8:23 AM   Specimen: Urine, Clean Catch  Result Value Ref Range Status   Specimen Description   Final    URINE, CLEAN CATCH Performed at [facility], [address], [place], [place] [address]    Special Requests   Final    NONE Performed at [facility], [address], [place], [place] [address]    Culture MULTIPLE SPECIES PRESENT, SUGGEST RECOLLECTION (A)  Final   Report Status 03/01/2022 FINAL  Final     Labs: BNP (last 3 results) No results for input(s): "BNP" in the last 8760 hours. Basic Metabolic Panel: Recent Labs  Lab 02/27/22 0318 02/28/22 0518 03/01/22 0451 03/02/22 0433 03/03/22 0505  NA 123* 130* 129* 129* 133*  K 3.2* 3.7 3.4* 4.1 3.8  CL 93* 101 101 101 99  CO2 23 22 24 26 28   GLUCOSE 148* 92 93 99 101*  BUN 13 14 10 8 8   CREATININE 0.56 0.52 0.41* 0.36* 0.35*  CALCIUM 9.7 8.9 8.5* 8.4* 9.0  MG 1.3* 1.9  --   --  1.8  PHOS 4.3  --   --   --   --    Liver Function Tests: Recent Labs  Lab 02/28/22 0518 03/01/22 0451 03/02/22 0433 03/03/22 0902 03/04/22 0515  AST 507* 1,067* 835* 295* 124*  ALT 376* 841* 1,035* 702* 466*  ALKPHOS 48 51 62 72  66  BILITOT 2.1* 1.8* 1.7* 1.7* 1.6*  PROT 5.9* 5.5* 5.7* 5.7* 5.6*  ALBUMIN 3.7 3.3* 3.3* 3.5 3.2*   No results for input(s): "LIPASE", "AMYLASE" in the last 168 hours. No results for input(s): "AMMONIA" in the last 168 hours. CBC: Recent Labs  Lab 02/27/22 0318 02/28/22 0518 03/01/22 0451 03/02/22 0822 03/04/22 0515  WBC 11.0* 9.4 6.5 5.3  --   NEUTROABS 9.7*  --   --   --   --   HGB 10.2* 9.6* 8.2* 8.2* 8.9*  HCT 27.7* 27.9* 23.4* 24.5* 26.8*  MCV 97.5 102.2* 102.6* 103.8*  --   PLT 222 212 196 208  --    Cardiac Enzymes: Recent Labs  Lab 03/02/22 1510  CKTOTAL 112   BNP: Invalid input(s): "POCBNP" CBG: No results for input(s): "GLUCAP" in the last 168 hours. D-Dimer No results for input(s): "DDIMER" in the last 72 hours. Hgb A1c No results for input(s): "HGBA1C" in the last 72 hours. Lipid Profile No results for input(s): "CHOL", "HDL", "LDLCALC", "TRIG", "CHOLHDL", "LDLDIRECT" in the last 72 hours. Thyroid function studies No results for input(s): "TSH", "T4TOTAL", "T3FREE", "THYROIDAB" in the last 72 hours.  Invalid input(s): "FREET3" Anemia work up No results for input(s): "VITAMINB12", "FOLATE", "FERRITIN", "TIBC", "IRON", "RETICCTPCT" in the last 72 hours. Urinalysis    Component Value Date/Time   COLORURINE YELLOW 02/27/2022 0823   APPEARANCEUR CLEAR 02/27/2022 0823   LABSPEC 1.029 02/27/2022 0823   PHURINE 6.0 02/27/2022 0823   GLUCOSEU NEGATIVE 02/27/2022 0823   HGBUR NEGATIVE 02/27/2022 0823   BILIRUBINUR NEGATIVE 02/27/2022 0823   BILIRUBINUR small 09/15/2017 1210   KETONESUR NEGATIVE 02/27/2022 0823   PROTEINUR NEGATIVE 02/27/2022 0823   UROBILINOGEN 0.2 09/15/2017 1210   NITRITE NEGATIVE 02/27/2022 0823   LEUKOCYTESUR NEGATIVE 02/27/2022 0823   Sepsis Labs Recent Labs  Lab 02/27/22 0318 02/28/22 0518 03/01/22 0451 03/02/22 0822  WBC 11.0* 9.4 6.5 5.3   Microbiology Recent Results (from the past 240 hour(s))  Urine Culture      Status: Abnormal   Collection Time: 02/27/22  8:23 AM   Specimen: Urine, Clean Catch  Result Value Ref Range Status   Specimen Description   Final    URINE, CLEAN CATCH Performed at [facility], [address], [place], [place] [address]    Special Requests   Final    NONE Performed at [facility], [address] [place], [place] [address]    Culture MULTIPLE SPECIES PRESENT, SUGGEST RECOLLECTION (A)  Final   Report Status 03/01/2022 FINAL  Final     Time coordinating discharge: 47mins  SIGNED:   Kathie Dike, MD  [facility] 03/04/2022, 8:18 PM   If 7PM-7AM, please contact night-coverage [email or web address]

## 2022-03-04 NOTE — Telephone Encounter (Signed)
Patient needs hospital follow up of liver and anemia, consider egd/colonoscopy. Plan for follow up in 3-4 weeks.

## 2022-03-04 NOTE — Progress Notes (Signed)
Nsg Discharge Note  Admit Date:  02/27/2022 Discharge date: 03/04/2022   Cynthia Kelley to be D/C'd Home per MD order.  AVS completed.   Reviewed d/c paperwork with patient and husband. Answered all questions and clarified with Dr. Roderic Palau that patient could resume Pacerone. Called and changed patient's appointment with Dr. Marlou Sa for June 13th. Stable patient and belongings were wheeled to main entrance where her husband picked her up. Patient/caregiver able to verbalize understanding.  Discharge Medication: Allergies as of 03/04/2022   No Known Allergies      Medication List     STOP taking these medications    metoprolol 200 MG 24 hr tablet Commonly known as: TOPROL-XL       TAKE these medications    amiodarone 200 MG tablet Commonly known as: PACERONE Take 200 mg by mouth daily.   apixaban 5 MG Tabs tablet Commonly known as: ELIQUIS Take 5 mg by mouth 2 (two) times daily.   furosemide 20 MG tablet Commonly known as: LASIX Take 1 tablet (20 mg total) by mouth daily as needed for fluid.   memantine 10 MG tablet Commonly known as: NAMENDA Take 10 mg by mouth every morning.   metoprolol tartrate 25 MG tablet Commonly known as: LOPRESSOR Take 1 tablet (25 mg total) by mouth 2 (two) times daily.   multivitamin with minerals tablet Take 1 tablet by mouth daily.   oxyCODONE 5 MG immediate release tablet Commonly known as: Oxy IR/ROXICODONE Take 1 tablet (5 mg total) by mouth every 6 (six) hours as needed for moderate pain or severe pain.   polyethylene glycol 17 g packet Commonly known as: MIRALAX / GLYCOLAX Take 17 g by mouth daily. Start taking on: March 05, 2022   potassium chloride 10 MEQ tablet Commonly known as: KLOR-CON Take 10 mEq by mouth 2 (two) times daily.   sodium chloride 1 g tablet Take 1 tablet (1 g total) by mouth 2 (two) times daily with a meal. What changed: when to take this   traZODone 50 MG tablet Commonly known as: DESYREL Take 50 mg by mouth  at bedtime.               Durable Medical Equipment  (From admission, onward)           Start     Ordered   03/01/22 1102  For home use only DME 3 n 1  Once        03/01/22 1101            Discharge Assessment: Vitals:   03/04/22 0402 03/04/22 1407  BP: (!) 143/57 (!) 100/45  Pulse: 66 64  Resp: 18 16  Temp: 98 F (36.7 C) 98.3 F (36.8 C)  SpO2: 100% 100%   Skin clean, dry and intact without evidence of skin break down, no evidence of skin tears noted. IV catheter discontinued intact. Site without signs and symptoms of complications - no redness or edema noted at insertion site, patient denies c/o pain - only slight tenderness at site.  Dressing with slight pressure applied.  D/c Instructions-Education: Discharge instructions given to patient/family with verbalized understanding. D/c education completed with patient/family including follow up instructions, medication list, d/c activities limitations if indicated, with other d/c instructions as indicated by MD - patient able to verbalize understanding, all questions fully answered. Patient instructed to return to ED, call 911, or call MD for any changes in condition.  Patient escorted via [place], and D/C home via private auto.  Santa Lighter, RN 03/04/2022 5:36 PM

## 2022-03-04 NOTE — Progress Notes (Signed)
Occupational Therapy Treatment Patient Details Name: Cynthia Kelley MRN: [id number] DOB: Sep 04, 1956 Today's Date: 03/04/2022   History of present illness s/p fall with RUE proximal humerus fx-nonsurgical treatment. Pt has history of metastatic squamous cell carcinoma of the lung to the liver, paroxysmal atrial fibrillation, diastolic CHF, hypertension, chronic to impairment, hyponatremia, COPD, alcohol and tobacco abuse   OT comments  Pt agreeable to OT treatment. Pt demonstrates modified independence with grooming at sink, donning B LE socks, toilet transfer, and was educated on hemi dressing techniques with pt already donning t-shirt. Per clinical judgement pt likely Mod I for UE dressing as well. Pt is not recommended for further acute OT services and will be discharged to care of nursing staff for remaining length of stay.    Recommendations for follow up therapy are one component of a multi-disciplinary discharge planning process, led by the attending physician.  Recommendations may be updated based on patient status, additional functional criteria and insurance authorization.    Follow Up Recommendations  Follow physician's recommendations for discharge plan and follow up therapies    Assistance Recommended at Discharge Frequent or constant Supervision/Assistance  Patient can return home with the following  A little help with bathing/dressing/bathroom;Assistance with cooking/housework;Assist for transportation;Help with stairs or ramp for entrance   Equipment Recommendations  None recommended by OT    Recommendations for Other Services      Precautions / Restrictions Precautions Precautions: Fall;Shoulder Shoulder Interventions: Shoulder sling/immobilizer;At all times Required Braces or Orthoses: Sling Restrictions Weight Bearing Restrictions: Yes RUE Weight Bearing: Non weight bearing       Mobility Bed Mobility Overal bed mobility: Modified Independent              General bed mobility comments: HOB elevated    Transfers Overall transfer level: Modified independent Equipment used: None                     Balance Overall balance assessment: Modified Independent                                         ADL either performed or assessed with clinical judgement   ADL       Grooming: Modified independent;Standing;Oral care             Upper Body Dressing Details (indicate cue type and reason): Pt was educated on hemi-dressing techniques and given handout on one hand dressing and other one hand ADL tasks. Lower Body Dressing: Modified independent;Sitting/lateral leans Lower Body Dressing Details (indicate cue type and reason): Completed seated at EOB with mild extended time. Toilet Transfer: Modified Independent;Ambulation Toilet Transfer Details (indicate cue type and reason): Verbally educated on how to handle heart monitor but without this pt would be at mod I. Toileting- Clothing Manipulation and Hygiene: Modified independent;Sitting/lateral lean;Sit to/from stand       Functional mobility during ADLs: Modified independent (holding on to hear monitor at times, but more to manage monitor rather than for need to stabilize)                         Cognition Arousal/Alertness: Awake/alert Behavior During Therapy: [facility] for tasks assessed/performed Overall Cognitive Status: Within Functional Limits for tasks assessed  General Comments: bed alarm set upon entrance to pt's room                           Pertinent Vitals/ Pain       Pain Assessment Pain Assessment: Faces Faces Pain Scale: No hurt                                                                    Progress Toward Goals  OT Goals(current goals can now be found in the care plan section)  Progress towards OT goals: Progressing toward goals  Acute Rehab  OT Goals Patient Stated Goal: to go home OT Goal Formulation: With patient Time For Goal Achievement: 03/15/22 Potential to Achieve Goals: Good (ADL goals met) ADL Goals Pt Will Perform Upper Body Dressing: with modified independence;sitting Pt Will Perform Lower Body Dressing: with modified independence;sitting/lateral leans;sit to/from stand Pt Will Transfer to Toilet: with modified independence;ambulating;regular height toilet Pt Will Perform Toileting - Clothing Manipulation and hygiene: with modified independence;sitting/lateral leans;sit to/from stand (All ADL goals met; pt discharged)  Plan All goals met and education completed, patient discharged from OT services                                    End of Session Equipment Utilized During Treatment: Other (comment) (R UE sling)  OT Visit Diagnosis: Repeated falls (R29.6);Muscle weakness (generalized) (M62.81);Pain Pain - Right/Left: Right Pain - part of body: Shoulder   Activity Tolerance Patient tolerated treatment well   Patient Left in bed;with call bell/phone within reach;with bed alarm set   Nurse Communication          Time: 5041-3643 OT Time Calculation (min): 12 min  Charges: OT General Charges $OT Visit: 1 Visit OT Treatments $Self Care/Home Management : 8-22 mins  Lalena Salas OT, MOT   Larey Seat 03/04/2022, 9:31 AM

## 2022-03-04 NOTE — Progress Notes (Signed)
Reviewed chart: LFTs continue to improve. EBV IgG positive but IgM negative. CMV IgM negative. Several serologies pending and may not be back for couple of days. Hgb is stable. Suspected liver injury due to ischemia. May have underlying IDA.  Will follow up on pending labs when available. Plans for outpatient follow up in office regarding liver and anemia, consider outpatient egd/colonoscopy when able (fractured humerus).   GI will sign off, call with any questions.   Laureen Ochs. Bernarda Caffey [facility] [phone] 6/8/202310:24 AM

## 2022-03-04 NOTE — Patient Instructions (Signed)
  Self-care Using One Hand For the Upper Body General tips  Find methods to save your energy: Sit to work, if you need to. Make a list of activities for the day. Give yourself enough time to complete each task. Schedule rest breaks.  Use your body to keep objects stable. For example, place a jar between your legs. Use your good arm to unscrew the lid.  Place objects on a non-slip surface. For example, a rubber mat keeps items from sliding on a counter.  When you handle small objects, use all the fingers of your hand. For example, place your keys in the palm of your hand. Use your thumb to separate the keys.  You may need special equipment to help you do tasks with one hand. Your OT (occupational therapist) can help with your specific needs. Tips for grooming and bathing  Keep items within easy reach, both on the counter and in the shower. Use Art gallery manager.  Use items that are easy to open: liquid soap, spray deodorant, and toothpaste with a flip-top.  Use an electric toothbrush or razor.  Use a denture brush or nail brush with suction cups.  Try using a slip-on wash mitt. A long-handled bath sponge may help you reach all parts of your body. Tips for dressing   Pullover shirts are easier than shirts with buttons.  Wear clothes that are easy to fasten or that fasten in front. Replace buttons and clasps with Velcro.  If you wear a bra, choose bras that pull over your head (sports bras) or fasten in front. If you use a bra that hooks in the back, your therapist can show you how to put it on.  Sit in a straight-backed chair. Place your clothes within reach, in the order you will put them on.  If you had arm surgery, only move your arm as directed by your doctor. continued For informational purposes only. Not to replace the advice of your health care provider. Copyright  2008 [facility]. All rights reserved. Clinically reviewed by Greta Doom,  [place], OTR/L .[place] [id number] - REV 12/22. Page 1 of 2 Pullover shirts 1. Dress the weak arm first. Place the shirt face-down on your lap. The neck should open at your knees. 2. Gather up the clothing to expose the armhole. Place the weak arm through the sleeve. 3. Place your good arm through the other sleeve. 4. Before putting the shirt over your head, make sure both sleeves are pushed up over your elbows. 5. Gather the shirt from hemline to collar. Duck your head and pull the shirt over your head. 6. To take off the shirt, undress your good arm first. Buttoned shirts and jackets 1. Dress the weak arm first. Open the shirt and place it on your lap. The inside should face up with the tag showing. The neck should open at your knees. 2. Lift your weak arm and place it into the armhole. 3. Pull the armhole well above your elbow. Pull the shirt onto the shoulder. 4. Bring the rest of the shirt around your back: Throw it over the shoulder, or reach for it behind your neck. 5. Put your good arm through the sleeve. 6. Line up the front edges of the shirt. Start buttoning from the bottom and work upward. 7. To take off the shirt, undress your good arm first. Putting on a Pullover Step 1 Step 2 Step 3 Step

## 2022-03-04 NOTE — Plan of Care (Signed)
  Problem: Education: Goal: Knowledge of General Education information will improve Description: Including pain rating scale, medication(s)/side effects and non-pharmacologic comfort measures Outcome: Progressing   Problem: Clinical Measurements: Goal: Ability to maintain clinical measurements within normal limits will improve Outcome: Progressing Goal: Diagnostic test results will improve Outcome: Progressing   

## 2022-03-04 NOTE — TOC Transition Note (Signed)
Transition of Care [facility]) - CM/SW Discharge Note   Patient Details  Name: Cynthia Kelley MRN: [id number] Date of Birth: 1956-01-25  Transition of [facility]) CM/SW Contact:  Iona Beard, [place] Phone Number: 03/04/2022, 11:24 AM   Clinical Narrative:    TOC made outpatient PT referral for pt. 3N1 has been ordered through [facility] and will be delivered to pts home. TOC signing off.  Final next level of care: OP Rehab Barriers to Discharge: Continued Medical Work up   Patient Goals and CMS Choice Patient states their goals for this hospitalization and ongoing recovery are:: return home   Choice offered to / list presented to : Patient, Spouse  Discharge Placement                       Discharge Plan and Services In-house Referral: Clinical Social Work              DME Arranged: 3-N-1 DME Agency: [employer] Date DME Agency Contacted: 03/01/22 Time DME Agency Contacted: [phone] Representative spoke with at DME Agency: [place] ([place]) Interventions     Readmission Risk Interventions    03/01/2022   11:11 AM 03/03/2021    9:21 AM  Readmission Risk Prevention Plan  Transportation Screening Complete Complete  HRI or Home Care Consult Complete Complete  Social Work Consult for [place] Planning/Counseling Complete Complete  Palliative Care Screening  Not Applicable  Medication Review Press photographer) Complete Complete

## 2022-03-05 ENCOUNTER — Telehealth: Payer: Self-pay | Admitting: *Deleted

## 2022-03-05 ENCOUNTER — Ambulatory Visit: Payer: Medicare Other | Admitting: Orthopedic Surgery

## 2022-03-05 LAB — HCV RNA QUANT: HCV Quantitative: NOT DETECTED IU/mL (ref >50)

## 2022-03-05 NOTE — Telephone Encounter (Signed)
Transition Care Management Unsuccessful Follow-up Telephone Call  Date of discharge and from where:  03-04-22  Attempts:  1st Attempt  Reason for unsuccessful TCM follow-up call:  Left voice message   .

## 2022-03-09 ENCOUNTER — Ambulatory Visit: Payer: Medicare Other | Admitting: Surgical

## 2022-03-09 DIAGNOSIS — S42291A Other displaced fracture of upper end of right humerus, initial encounter for closed fracture: Secondary | ICD-10-CM

## 2022-03-09 DIAGNOSIS — M8580 Other specified disorders of bone density and structure, unspecified site: Secondary | ICD-10-CM | POA: Diagnosis not present

## 2022-03-09 DIAGNOSIS — E559 Vitamin D deficiency, unspecified: Secondary | ICD-10-CM | POA: Diagnosis not present

## 2022-03-10 LAB — VITAMIN D 25 HYDROXY (VIT D DEFICIENCY, FRACTURES): Vit D, 25-Hydroxy: 39 ng/mL (ref 30–100)

## 2022-03-12 ENCOUNTER — Encounter: Payer: Self-pay | Admitting: Surgical

## 2022-03-12 NOTE — Progress Notes (Signed)
Office Visit Note   Patient: Cynthia Kelley           Date of Birth: [birth year]           MRN: [id number] Visit Date: 03/09/2022 Requested by: Mordecai Rasmussen, MD [phone] [address],  [place] [address] PCP: Lindell Spar, MD  Subjective: Chief Complaint  Patient presents with   Right Shoulder - Injury    HPI: Cynthia Kelley is a 66 y.o. female who presents to the office complaining of right shoulder pain.  Patient was recently hospitalized following fall she sustained a comminuted proximal humerus fracture.  She was seen inpatient by Dr. Amedeo Kinsman and surgery was discussed with Dr. Marlou Sa.  Not really amenable to operative fixation.  She had CT scan done while in the hospital.  She states that since getting out of the hospital, pain is significantly improving.  She is only taking pain medication about 2 times per day.  Her and her husband have talked extensively about what they would like to do regarding neck steps for this fracture.  She does have a history of smoking and smokes about 1 pack/day though she smoked a little less since coming home from hospital.  She has a fairly extensive medical history including CHF, atrial fibrillation, COPD, tobacco and alcohol use, stage IV lung cancer.              ROS: All systems reviewed are negative as they relate to the chief complaint within the history of present illness.  Patient denies fevers or chills.  Assessment & Plan: Visit Diagnoses:  1. Other closed displaced fracture of proximal end of right humerus, initial encounter     Plan: Patient is a 66 year old female who presents for evaluation of right proximal humerus fracture.  She has comminuted fracture that does not really seem amenable to operative fixation and as such really the main options would be nonoperative treatment versus surgical management in the form of reverse shoulder arthroplasty.  Discussed both options in great detail in regards to the potential complications and recovery  timeframe.  She understands that she will have limited function of the shoulder to some degree likely no matter what but reverse shoulder arthroplasty would likely give her the most optimal function.  Pain is significantly improving for her as she gets out from the injury.  She understands that there is a risk of nonunion and chronic pain from this fracture if she proceeds with nonoperative management, especially with her medical comorbidities and history of smoking.  Cautioned her against smoking is much as she can help it as this fracture heals.  Also discussed the risks and benefits of biopsy including the risk of nerve/vessel damage, shoulder stiffness, decreased shoulder function, medical complication from surgery, prosthetic joint infection, need for revision surgery in the future.  After lengthy discussion, patient states that she would much prefer to proceed with nonoperative management.  She will remain in sling with no lifting with the injured extremity.  Follow-up in 2 weeks for clinical recheck with Dr. Marlou Sa.  Vitamin D was checked today to minimize chance of nonunion and was found to be 39.  Follow-Up Instructions: Return in about 2 weeks (around 03/23/2022) for with Dr Marlou Sa.   Orders:  Orders Placed This Encounter  Procedures   Vitamin D (25 hydroxy)   No orders of the defined types were placed in this encounter.     Procedures: No procedures performed   Clinical Data: No additional findings.  Objective: Vital Signs: There were no vitals taken for this visit.  Physical Exam:  Constitutional: Patient appears well-developed HEENT:  Head: Normocephalic Eyes:EOM are normal Neck: Normal range of motion Cardiovascular: Normal rate Pulmonary/chest: Effort normal Neurologic: Patient is alert Skin: Skin is warm Psychiatric: Patient has normal mood and affect  Ortho Exam: Ortho exam demonstrates 1+ radial pulse of the right upper extremity.  Equivalent pulse to the contralateral  arm.  Intact EPL, FPL, finger abduction, finger adduction, wrist extension, pronation/supination.  Axillary nerve intact with deltoid firing.  Intact bicep flexion and tricep extension.  Swelling and ecchymosis noted throughout the upper arm and forearm.  Specialty Comments:  No specialty comments available.  Imaging: No results found.   PMFS History: Patient Active Problem List   Diagnosis Date Noted   Elevated liver enzymes    Chronic alcohol abuse    Long term current use of high dose acetaminophen    Humeral surgical neck fracture 02/27/2022   Fall at home, initial encounter 02/27/2022   Transaminitis 02/27/2022   Dementia without behavioral disturbance ([place]) 02/27/2022   Hypomagnesemia 02/27/2022   Paroxysmal atrial fibrillation ([place]) 02/27/2022   Hypokalemia 03/03/2021   Leukocytosis 03/03/2021   Anemia 03/03/2021   Thrombocytopenia ([place]) 03/03/2021   Prolonged QT interval 03/03/2021   Dehydration 03/03/2021   COPD with acute exacerbation ([place]) 01/14/2021   Acute on chronic diastolic CHF (congestive heart failure) ([place]) 01/14/2021   Acute respiratory failure with hypoxia ([employer]) 01/12/2021   Community acquired pneumonia 01/12/2021   Hyponatremia 01/12/2021   Alcohol dependence ([place]) 01/12/2021   Encounter to establish care 11/19/2020   Anxiety 11/19/2020   Chronic diastolic CHF (congestive heart failure) ([place]) 11/11/2020   Typical atrial flutter ([place]) 11/11/2020   Oropharyngeal dysphagia 08/13/2020   Screening for colorectal cancer 07/19/2018   Dry eye syndrome of both eyes 06/08/2018   Nuclear sclerotic cataract of both eyes 06/08/2018   Posterior vitreous detachment of both eyes 06/08/2018   Lung cancer, primary, with metastasis from lung to other site [facility]) 01/16/2018   Osseous metastasis 11/10/2017   Metastatic squamous cell carcinoma 10/20/2017   Tobacco abuse 12/14/2016   Osteopenia 05/15/2013   KNEE, ARTHRITIS, DEGEN./OSTEO 05/11/2010   Past Medical  History:  Diagnosis Date   Acute diastolic congestive heart failure, NYHA class 2 ([place]) 11/11/2020   Anemia 11/22/2017   Anxiety 11/19/2020   Arthritis    Asymptomatic PVD (peripheral vascular disease) ([place]) 12/14/2016   Suspected.  Hands hyperemic with decreased cap refill. ? History vasospasm   COPD with acute exacerbation ([place]) 01/14/2021   FRACTURE, TIBIAL PLATEAU 05/11/2010   Qualifier: Diagnosis of  By: Aline Brochure MD, [place]     Lung cancer, primary, with metastasis from lung to other site [facility]) 01/16/2018   Metastatic to the liver   Nuclear sclerotic cataract of both eyes 06/08/2018   Osteopenia 05/15/2013   Osteoporosis    osteopenia   Tobacco abuse 12/14/2016    Family History  Problem Relation Age of Onset   Cancer Father        died in 3's everywhere   Heart disease Mother    Cancer Mother        lung   Stroke Mother    Asthma Daughter     Past Surgical History:  Procedure Laterality Date   FRACTURE SURGERY     torn ACL   KNEE ARTHROSCOPY     Social History   Occupational History   Occupation: Magazine features editor groomer    Comment:  retired  Tobacco Use   Smoking status: Every Day    Packs/day: 0.50    Years: 20.00    Total pack years: 10.00    Types: Cigarettes    Start date: 09/28/1963   Smokeless tobacco: Never  Vaping Use   Vaping Use: Never used  Substance and Sexual Activity   Alcohol use: Yes    Alcohol/week: 20.0 standard drinks of alcohol    Types: 20 Cans of beer per week    Comment: 3-4 beers a day   Drug use: No   Sexual activity: Yes    Birth control/protection: Post-menopausal

## 2022-03-24 ENCOUNTER — Encounter: Payer: Self-pay | Admitting: Orthopedic Surgery

## 2022-03-24 ENCOUNTER — Ambulatory Visit: Payer: Medicare Other | Admitting: Orthopedic Surgery

## 2022-03-24 ENCOUNTER — Ambulatory Visit (INDEPENDENT_AMBULATORY_CARE_PROVIDER_SITE_OTHER): Payer: Medicare Other

## 2022-03-24 DIAGNOSIS — S42291A Other displaced fracture of upper end of right humerus, initial encounter for closed fracture: Secondary | ICD-10-CM

## 2022-03-24 NOTE — Progress Notes (Addendum)
Post-Op Visit Note   Patient: Cynthia Kelley           Date of Birth: 1955-12-21           MRN: [id number] Visit Date: 03/24/2022 PCP: Cynthia Spar, MD   Assessment & Plan:  Chief Complaint:  Chief Complaint  Patient presents with   Right Shoulder - Fracture, Follow-up   Visit Diagnoses:  1. Other closed displaced fracture of proximal end of right humerus, initial encounter     Plan: Cynthia Kelley is a 66 year old patient with severe right closed proximal humerus fracture.  Date of injury 02/27/2022.  Not an operative candidate due to multiple medical comorbidities.  Overall the pain has slowly improved but still patient is having a moderate amount of pain.  On examination the fracture does feel like it moves as a unit with internal/external rotation of the arm.  Radial pulse remains palpable bilaterally.  Deltoid is functional.  Plan at this time is to discontinue sling but no lifting with the right arm.  Come back in 4 weeks for clinical recheck, repeat neurological and vascular assessment, initiation of physical therapy.  Follow-Up Instructions: Return in about 4 weeks (around 04/21/2022).   Orders:  Orders Placed This Encounter  Procedures   XR Shoulder Right   No orders of the defined types were placed in this encounter.   Imaging: XR Shoulder Right  Result Date: 03/24/2022 AP axillary and outlet views right shoulder reviewed.  Proximal humerus fracture is again demonstrated.  Significant displacement is present in this three-part fracture.  Humeral shaft has been pulled anteriorly relative to the fragmented head.  Only part of the head remains located adjacent to the glenoid.  No significant change from prior radiographs.   PMFS History: Patient Active Problem List   Diagnosis Date Noted   Elevated liver enzymes    Chronic alcohol abuse    Long term current use of high dose acetaminophen    Humeral surgical neck fracture 02/27/2022   Fall at home, initial encounter 02/27/2022    Transaminitis 02/27/2022   Dementia without behavioral disturbance ([place]) 02/27/2022   Hypomagnesemia 02/27/2022   Paroxysmal atrial fibrillation ([place]) 02/27/2022   Hypokalemia 03/03/2021   Leukocytosis 03/03/2021   Anemia 03/03/2021   Thrombocytopenia ([place]) 03/03/2021   Prolonged QT interval 03/03/2021   Dehydration 03/03/2021   COPD with acute exacerbation ([place]) 01/14/2021   Acute on chronic diastolic CHF (congestive heart failure) ([place]) 01/14/2021   Acute respiratory failure with hypoxia ([place]) 01/12/2021   Community acquired pneumonia 01/12/2021   Hyponatremia 01/12/2021   Alcohol dependence ([place]) 01/12/2021   Encounter to establish care 11/19/2020   Anxiety 11/19/2020   Chronic diastolic CHF (congestive heart failure) ([place]) 11/11/2020   Typical atrial flutter ([place]) 11/11/2020   Oropharyngeal dysphagia 08/13/2020   Screening for colorectal cancer 07/19/2018   Dry eye syndrome of both eyes 06/08/2018   Nuclear sclerotic cataract of both eyes 06/08/2018   Posterior vitreous detachment of both eyes 06/08/2018   Lung cancer, primary, with metastasis from lung to other site [facility]) 01/16/2018   Osseous metastasis 11/10/2017   Metastatic squamous cell carcinoma 10/20/2017   Tobacco abuse 12/14/2016   Osteopenia 05/15/2013   KNEE, ARTHRITIS, DEGEN./OSTEO 05/11/2010   Past Medical History:  Diagnosis Date   Acute diastolic congestive heart failure, NYHA class 2 ([place]) 11/11/2020   Anemia 11/22/2017   Anxiety 11/19/2020   Arthritis    Asymptomatic PVD (peripheral vascular disease) ([place]) 12/14/2016   Suspected.  Hands hyperemic  with decreased cap refill. ? History vasospasm   COPD with acute exacerbation ([place]) 01/14/2021   FRACTURE, TIBIAL PLATEAU 05/11/2010   Qualifier: Diagnosis of  By: Aline Brochure MD, [place]     Lung cancer, primary, with metastasis from lung to other site [facility]) 01/16/2018   Metastatic to the liver   Nuclear sclerotic cataract of both eyes 06/08/2018    Osteopenia 05/15/2013   Osteoporosis    osteopenia   Tobacco abuse 12/14/2016    Family History  Problem Relation Age of Onset   Cancer Father        died in 45's everywhere   Heart disease Mother    Cancer Mother        lung   Stroke Mother    Asthma Daughter     Past Surgical History:  Procedure Laterality Date   FRACTURE SURGERY     torn ACL   KNEE ARTHROSCOPY     Social History   Occupational History   Occupation: Air traffic controller    Comment: retired  Tobacco Use   Smoking status: Every Day    Packs/day: 0.50    Years: 20.00    Total pack years: 10.00    Types: Cigarettes    Start date: 09/28/1963   Smokeless tobacco: Never  Vaping Use   Vaping Use: Never used  Substance and Sexual Activity   Alcohol use: Yes    Alcohol/week: 20.0 standard drinks of alcohol    Types: 20 Cans of beer per week    Comment: 3-4 beers a day   Drug use: No   Sexual activity: Yes    Birth control/protection: Post-menopausal

## 2022-03-26 ENCOUNTER — Telehealth: Payer: Self-pay | Admitting: Orthopedic Surgery

## 2022-03-26 ENCOUNTER — Other Ambulatory Visit: Payer: Self-pay | Admitting: Surgical

## 2022-03-26 MED ORDER — OXYCODONE HCL 5 MG PO TABS: 5.0000 mg | ORAL_TABLET | Freq: Two times a day (BID) | ORAL | 0 refills | Status: DC | PRN

## 2022-03-26 NOTE — Telephone Encounter (Signed)
Tried calling to advise done-no answer.

## 2022-03-26 NOTE — Telephone Encounter (Signed)
Patient would like oxycodone called in for pain. Her husband is calling in for her. His number is [phone]

## 2022-03-26 NOTE — Telephone Encounter (Signed)
Sent in

## 2022-04-05 ENCOUNTER — Ambulatory Visit (INDEPENDENT_AMBULATORY_CARE_PROVIDER_SITE_OTHER): Payer: Medicare Other | Admitting: Gastroenterology

## 2022-04-05 ENCOUNTER — Encounter (INDEPENDENT_AMBULATORY_CARE_PROVIDER_SITE_OTHER): Payer: Self-pay | Admitting: Gastroenterology

## 2022-04-21 ENCOUNTER — Ambulatory Visit (INDEPENDENT_AMBULATORY_CARE_PROVIDER_SITE_OTHER): Payer: Medicare Other

## 2022-04-21 ENCOUNTER — Telehealth: Payer: Self-pay | Admitting: Orthopedic Surgery

## 2022-04-21 ENCOUNTER — Ambulatory Visit: Payer: Medicare Other | Admitting: Orthopedic Surgery

## 2022-04-21 ENCOUNTER — Other Ambulatory Visit: Payer: Self-pay | Admitting: Orthopedic Surgery

## 2022-04-21 DIAGNOSIS — S42291A Other displaced fracture of upper end of right humerus, initial encounter for closed fracture: Secondary | ICD-10-CM

## 2022-04-21 MED ORDER — OXYCODONE HCL 5 MG PO TABS: 5.0000 mg | ORAL_TABLET | Freq: Two times a day (BID) | ORAL | 0 refills | Status: DC | PRN

## 2022-04-21 NOTE — Telephone Encounter (Signed)
IC advised.  

## 2022-04-21 NOTE — Telephone Encounter (Signed)
Sent!

## 2022-04-21 NOTE — Telephone Encounter (Signed)
Patient's husband  [name] called advised the pain medicine is not showing received at the pharmacy yet, (Oxycodone)    Patient uses [facility], [place]    The  number to contact [name] is [phone]

## 2022-04-24 ENCOUNTER — Encounter: Payer: Self-pay | Admitting: Orthopedic Surgery

## 2022-04-24 NOTE — Progress Notes (Signed)
Post-Op Visit Note   Patient: Cynthia Kelley           Date of Birth: 09-29-1955           MRN: [id number] Visit Date: 04/21/2022 PCP: Lindell Spar, MD   Assessment & Plan:  Chief Complaint:  Chief Complaint  Patient presents with   Right Shoulder - Follow-up, Fracture   Visit Diagnoses:  1. Other closed displaced fracture of proximal end of right humerus, initial encounter     Plan: Cynthia Kelley is a 66 year old patient with right proximal humerus fracture.  She is not a great candidate for operative intervention despite her fracture being severely displaced.  Overall her pain is improving but her function is not quite where she wants it to be.  On examination deltoid fires in the fracture does appear to be moving together as a unit with internal and external rotation of the arm.  Has only about 30 or 40 degrees of active forward flexion and AB duction which is the expected outcome based on this fracture.  Plan at this time is to start physical therapy in [place] with 8-week return for final recheck.  Follow-Up Instructions: Return in about 8 weeks (around 06/16/2022).   Orders:  Orders Placed This Encounter  Procedures   XR Shoulder Right   No orders of the defined types were placed in this encounter.   Imaging: No results found.  PMFS History: Patient Active Problem List   Diagnosis Date Noted   Elevated liver enzymes    Chronic alcohol abuse    Long term current use of high dose acetaminophen    Humeral surgical neck fracture 02/27/2022   Fall at home, initial encounter 02/27/2022   Transaminitis 02/27/2022   Dementia without behavioral disturbance ([place]) 02/27/2022   Hypomagnesemia 02/27/2022   Paroxysmal atrial fibrillation ([place]) 02/27/2022   Hypokalemia 03/03/2021   Leukocytosis 03/03/2021   Anemia 03/03/2021   Thrombocytopenia ([place]) 03/03/2021   Prolonged QT interval 03/03/2021   Dehydration 03/03/2021   COPD with acute exacerbation ([place]) 01/14/2021   Acute on  chronic diastolic CHF (congestive heart failure) ([place]) 01/14/2021   Acute respiratory failure with hypoxia ([place]) 01/12/2021   Community acquired pneumonia 01/12/2021   Hyponatremia 01/12/2021   Alcohol dependence ([place]) 01/12/2021   Encounter to establish care 11/19/2020   Anxiety 11/19/2020   Chronic diastolic CHF (congestive heart failure) ([place]) 11/11/2020   Typical atrial flutter ([place]) 11/11/2020   Oropharyngeal dysphagia 08/13/2020   Screening for colorectal cancer 07/19/2018   Dry eye syndrome of both eyes 06/08/2018   Nuclear sclerotic cataract of both eyes 06/08/2018   Posterior vitreous detachment of both eyes 06/08/2018   Lung cancer, primary, with metastasis from lung to other site [facility]) 01/16/2018   Osseous metastasis 11/10/2017   Metastatic squamous cell carcinoma 10/20/2017   Tobacco abuse 12/14/2016   Osteopenia 05/15/2013   KNEE, ARTHRITIS, DEGEN./OSTEO 05/11/2010   Past Medical History:  Diagnosis Date   Acute diastolic congestive heart failure, NYHA class 2 ([place]) 11/11/2020   Anemia 11/22/2017   Anxiety 11/19/2020   Arthritis    Asymptomatic PVD (peripheral vascular disease) ([place]) 12/14/2016   Suspected.  Hands hyperemic with decreased cap refill. ? History vasospasm   COPD with acute exacerbation ([place]) 01/14/2021   FRACTURE, TIBIAL PLATEAU 05/11/2010   Qualifier: Diagnosis of  By: Aline Brochure MD, [place]     Lung cancer, primary, with metastasis from lung to other site [facility]) 01/16/2018   Metastatic to the liver   Nuclear  sclerotic cataract of both eyes 06/08/2018   Osteopenia 05/15/2013   Osteoporosis    osteopenia   Tobacco abuse 12/14/2016    Family History  Problem Relation Age of Onset   Cancer Father        died in 58's everywhere   Heart disease Mother    Cancer Mother        lung   Stroke Mother    Asthma Daughter     Past Surgical History:  Procedure Laterality Date   FRACTURE SURGERY     torn ACL   KNEE ARTHROSCOPY     Social History    Occupational History   Occupation: Air traffic controller    Comment: retired  Tobacco Use   Smoking status: Every Day    Packs/day: 0.50    Years: 20.00    Total pack years: 10.00    Types: Cigarettes    Start date: 09/28/1963   Smokeless tobacco: Never  Vaping Use   Vaping Use: Never used  Substance and Sexual Activity   Alcohol use: Yes    Alcohol/week: 20.0 standard drinks of alcohol    Types: 20 Cans of beer per week    Comment: 3-4 beers a day   Drug use: No   Sexual activity: Yes    Birth control/protection: Post-menopausal

## 2022-04-26 NOTE — Addendum Note (Signed)
Addended byLaurann Montana on: 04/26/2022 10:26 AM   Modules accepted: Orders

## 2022-05-10 ENCOUNTER — Encounter (HOSPITAL_COMMUNITY): Payer: Self-pay

## 2022-05-10 ENCOUNTER — Ambulatory Visit (HOSPITAL_COMMUNITY): Payer: Medicare Other | Attending: Orthopedic Surgery

## 2022-05-10 DIAGNOSIS — M25611 Stiffness of right shoulder, not elsewhere classified: Secondary | ICD-10-CM | POA: Diagnosis not present

## 2022-05-10 DIAGNOSIS — R29898 Other symptoms and signs involving the musculoskeletal system: Secondary | ICD-10-CM | POA: Insufficient documentation

## 2022-05-10 DIAGNOSIS — M25511 Pain in right shoulder: Secondary | ICD-10-CM | POA: Diagnosis not present

## 2022-05-10 NOTE — Patient Instructions (Signed)
Perform each exercise ____10-15____ reps. 2-3x days.   1) Protraction   Start by holding a wand or cane at chest height.  Next, slowly push the wand outwards in front of your body so that your elbows become fully straightened. Then, return to the original position.     2) Shoulder FLEXION   In the standing position, hold a wand/cane with both arms, palms down on both sides. Raise up the wand/cane allowing your unaffected arm to perform most of the effort. Your affected arm should be partially relaxed.      3) Internal/External ROTATION   In the standing position, hold a wand/cane with both hands keeping your elbows bent. Move your arms and wand/cane to one side.  Your affected arm should be partially relaxed while your unaffected arm performs most of the effort.       4) Shoulder ABDUCTION   While holding a wand/cane palm face up on the injured side and palm face down on the uninjured side, slowly raise up your injured arm to the side.

## 2022-05-10 NOTE — Therapy (Signed)
OUTPATIENT OCCUPATIONAL THERAPY ORTHO EVALUATION  Patient Name: Cynthia Kelley MRN: [id number] DOB:1956-01-28, 66 y.o., female Today's Date: 05/10/2022  PCP: Ihor Dow, MD REFERRING PROVIDER: Meredith Pel, MD   OT End of Session - 05/10/22 1406     Visit Number 1    Number of Visits 12    Date for OT Re-Evaluation 06/18/22    Authorization Type UHC Medicare    Authorization Time Period No visit limit, no auth    Progress Note Due on Visit 10    OT Start Time 0945    OT Stop Time 1020    OT Time Calculation (min) 35 min    Activity Tolerance Patient tolerated treatment well    Behavior During Therapy [facility] for tasks assessed/performed             Past Medical History:  Diagnosis Date   Acute diastolic congestive heart failure, NYHA class 2 ([place]) 11/11/2020   Anemia 11/22/2017   Anxiety 11/19/2020   Arthritis    Asymptomatic PVD (peripheral vascular disease) ([place]) 12/14/2016   Suspected.  Hands hyperemic with decreased cap refill. ? History vasospasm   COPD with acute exacerbation ([place]) 01/14/2021   FRACTURE, TIBIAL PLATEAU 05/11/2010   Qualifier: Diagnosis of  By: Aline Brochure MD, [place]     Lung cancer, primary, with metastasis from lung to other site [facility]) 01/16/2018   Metastatic to the liver   Nuclear sclerotic cataract of both eyes 06/08/2018   Osteopenia 05/15/2013   Osteoporosis    osteopenia   Tobacco abuse 12/14/2016   Past Surgical History:  Procedure Laterality Date   FRACTURE SURGERY     torn ACL   KNEE ARTHROSCOPY     Patient Active Problem List   Diagnosis Date Noted   Elevated liver enzymes    Chronic alcohol abuse    Long term current use of high dose acetaminophen    Humeral surgical neck fracture 02/27/2022   Fall at home, initial encounter 02/27/2022   Transaminitis 02/27/2022   Dementia without behavioral disturbance ([place]) 02/27/2022   Hypomagnesemia 02/27/2022   Paroxysmal atrial fibrillation ([place]) 02/27/2022   Hypokalemia  03/03/2021   Leukocytosis 03/03/2021   Anemia 03/03/2021   Thrombocytopenia ([place]) 03/03/2021   Prolonged QT interval 03/03/2021   Dehydration 03/03/2021   COPD with acute exacerbation ([place]) 01/14/2021   Acute on chronic diastolic CHF (congestive heart failure) ([place]) 01/14/2021   Acute respiratory failure with hypoxia ([place]) 01/12/2021   Community acquired pneumonia 01/12/2021   Hyponatremia 01/12/2021   Alcohol dependence ([place]) 01/12/2021   Encounter to establish care 11/19/2020   Anxiety 11/19/2020   Chronic diastolic CHF (congestive heart failure) ([place]) 11/11/2020   Typical atrial flutter ([place]) 11/11/2020   Oropharyngeal dysphagia 08/13/2020   Screening for colorectal cancer 07/19/2018   Dry eye syndrome of both eyes 06/08/2018   Nuclear sclerotic cataract of both eyes 06/08/2018   Posterior vitreous detachment of both eyes 06/08/2018   Lung cancer, primary, with metastasis from lung to other site [facility]) 01/16/2018   Osseous metastasis 11/10/2017   Metastatic squamous cell carcinoma 10/20/2017   Tobacco abuse 12/14/2016   Osteopenia 05/15/2013   KNEE, ARTHRITIS, DEGEN./OSTEO 05/11/2010    ONSET DATE: 02/27/22  REFERRING DIAG: N17.001V (ICD-10-CM) - Other closed displaced fracture of proximal end of right humerus, initial encounter  THERAPY DIAG:  Acute pain of right shoulder  Stiffness of right shoulder, not elsewhere classified  Other symptoms and signs involving the musculoskeletal system  Rationale for Evaluation and Treatment Rehabilitation  SUBJECTIVE:   SUBJECTIVE STATEMENT: "I don't do anything around the house anymore. I can't." Pt accompanied by: self  PERTINENT HISTORY: Pt reports falling on tile on 02/27/22 resulting in a severe right closed proximal humerus fracture. It was determined that she was not a candidate for surgery due multiple medical comorbidities. She was instructed to wear a sling and discontinued use on 03/24/22 per MD. Pt was referred to OT by  Dr. Meredith Pel and instructed to return for a follow up apt following therapy (around 06/16/22).   PRECAUTIONS: None  WEIGHT BEARING RESTRICTIONS Yes WBAT  PAIN:  Are you having pain? No  4/10 pain with movement   FALLS: Has patient fallen in last 6 months? Yes. Number of falls 1, resulting in current injury   LIVING ENVIRONMENT: Lives with: lives with their spouse Lives in: House/apartment   PLOF: Independent  PATIENT GOALS "To get it where it doesn't hurt."  OBJECTIVE:   HAND DOMINANCE: Right  ADLs: Overall ADLs: Pt reports requiring assistance with dressing. Her husband is currently doing the cooking and chores around the house as pt is unable to complete due to pain and decreased ROM.   FUNCTIONAL OUTCOME MEASURES: FOTO: 54.71  UPPER EXTREMITY ROM     Active ROM Right eval  Shoulder flexion 41  Shoulder abduction 43  Shoulder internal rotation 90  Shoulder external rotation 0  (Blank rows = not tested)  Passive ROM Right eval  Shoulder flexion 92  Shoulder abduction 53  Shoulder internal rotation 90  Shoulder external rotation 8  (Blank rows = not tested)  UPPER EXTREMITY MMT:     MMT Right eval  Shoulder flexion 3-/5  Shoulder abduction 3-/5  Shoulder internal rotation 4/5  Shoulder external rotation 3/5  (Blank rows = not tested)  HAND FUNCTION: Grip strength: Right: 15 lbs; Left: 29 lbs    COGNITION: Overall cognitive status: Impaired Required directions to be repeated several times. Some difficulty noted with recall.   TODAY'S TREATMENT:  Evaluation    PATIENT EDUCATION: Education details: AA/ROM Person educated: Patient and Spouse Education method: Explanation, Media planner, and Handouts Education comprehension: verbalized understanding and returned demonstration   HOME EXERCISE PROGRAM: Eval: AA/ROM  GOALS: Goals reviewed with patient? Yes   LONG TERM GOALS: Target date: 06/18/22  Pt will be provided with and  educated on HEP to improve mobility in RUE required for ADL completion  Goal status: INITIAL  2.  Pt will decrease pain in RUE to 1/10 or less with movement in order to complete grooming/hygiene tasks without significant pain  Goal status: INITIAL  3.  Pt will decrease RUE fascial restrictions to minimal amounts or less to improve mobility required for shoulder height reaching tasks such as putting away groceries.  Goal status: INITIAL  4.  Pt will increase P/ROM in RUE to [facility] to improve ability to perform dressing tasks with minimal compensatory techniques.   Goal status: INITIAL  5.  Pt will increase A/ROM of RUE to [facility] to improve ability to reach shoulder height to retrieve clothing items from closet   Goal status: INITIAL  6.  Pt will increase strength in RUE to 4/5 to improve ability to perform light lifting tasks required for housework such as putting dishes away.   Goal status: INITIAL  ASSESSMENT:  CLINICAL IMPRESSION: Patient is a 66 y.o. female who was seen today for an occupational therapy evaluation for decreased strength and ROM as well as increased pain following a non-surgical proximal humerus  fracture. She is unable to complete ADLs independently and requires assistance from her husband.    PERFORMANCE DEFICITS in functional skills including ADLs, IADLs, ROM, strength, pain, fascial restrictions, and UE functional use.  IMPAIRMENTS are limiting patient from ADLs, IADLs, rest and sleep, leisure, and social participation.   COMORBIDITIES may have co-morbidities  that affects occupational performance. Patient will benefit from skilled OT to address above impairments and improve overall function.  MODIFICATION OR ASSISTANCE TO COMPLETE EVALUATION: No modification of tasks or assist necessary to complete an evaluation.  OT OCCUPATIONAL PROFILE AND HISTORY: Problem focused assessment: Including review of records relating to presenting problem.  CLINICAL DECISION  MAKING: LOW - limited treatment options, no task modification necessary  REHAB POTENTIAL: Fair Due to nature of injury and expected outcomes  EVALUATION COMPLEXITY: Low      PLAN: OT FREQUENCY: 2x/week  OT DURATION: 6 weeks  PLANNED INTERVENTIONS: self care/ADL training, therapeutic exercise, therapeutic activity, manual therapy, passive range of motion, moist heat, cryotherapy, patient/family education, energy conservation, and DME and/or AE instructions   CONSULTED AND AGREED WITH PLAN OF CARE: Patient  PLAN FOR NEXT SESSION: AA/ROM per protocol, pendulums, elbow/forearm ROM, grip strengthening    Flonnie Hailstone, Steele Sizer, OTR/L [phone]  05/10/2022, 5:06 PM

## 2022-05-11 DIAGNOSIS — I480 Paroxysmal atrial fibrillation: Secondary | ICD-10-CM | POA: Diagnosis not present

## 2022-05-11 DIAGNOSIS — R001 Bradycardia, unspecified: Secondary | ICD-10-CM | POA: Diagnosis not present

## 2022-05-11 DIAGNOSIS — I5032 Chronic diastolic (congestive) heart failure: Secondary | ICD-10-CM | POA: Diagnosis not present

## 2022-05-13 ENCOUNTER — Encounter (HOSPITAL_COMMUNITY): Payer: Self-pay

## 2022-05-13 ENCOUNTER — Ambulatory Visit (HOSPITAL_COMMUNITY): Payer: Medicare Other

## 2022-05-13 DIAGNOSIS — M25511 Pain in right shoulder: Secondary | ICD-10-CM | POA: Diagnosis not present

## 2022-05-13 DIAGNOSIS — M25611 Stiffness of right shoulder, not elsewhere classified: Secondary | ICD-10-CM

## 2022-05-13 DIAGNOSIS — R29898 Other symptoms and signs involving the musculoskeletal system: Secondary | ICD-10-CM | POA: Diagnosis not present

## 2022-05-13 NOTE — Therapy (Signed)
OUTPATIENT OCCUPATIONAL THERAPY TREATMENT NOTE   Patient Name: Cynthia Kelley MRN: [id number] DOB:October 30, 1955, 66 y.o., female Today's Date: 05/13/2022  PCP: Ihor Dow, MD REFERRING PROVIDER: Meredith Pel, MD   OT End of Session - 05/13/22 1348     Visit Number 2    Number of Visits 12    Date for OT Re-Evaluation 06/18/22    Authorization Type UHC Medicare    Authorization Time Period No visit limit, no auth    Progress Note Due on Visit 10    OT Start Time 1346    OT Stop Time 1419    OT Time Calculation (min) 33 min    Activity Tolerance Patient tolerated treatment well    Behavior During Therapy [facility] for tasks assessed/performed             Past Medical History:  Diagnosis Date   Acute diastolic congestive heart failure, NYHA class 2 ([place]) 11/11/2020   Anemia 11/22/2017   Anxiety 11/19/2020   Arthritis    Asymptomatic PVD (peripheral vascular disease) ([place]) 12/14/2016   Suspected.  Hands hyperemic with decreased cap refill. ? History vasospasm   COPD with acute exacerbation ([place]) 01/14/2021   FRACTURE, TIBIAL PLATEAU 05/11/2010   Qualifier: Diagnosis of  By: Aline Brochure MD, [place]     Lung cancer, primary, with metastasis from lung to other site [facility]) 01/16/2018   Metastatic to the liver   Nuclear sclerotic cataract of both eyes 06/08/2018   Osteopenia 05/15/2013   Osteoporosis    osteopenia   Tobacco abuse 12/14/2016   Past Surgical History:  Procedure Laterality Date   FRACTURE SURGERY     torn ACL   KNEE ARTHROSCOPY     Patient Active Problem List   Diagnosis Date Noted   Elevated liver enzymes    Chronic alcohol abuse    Long term current use of high dose acetaminophen    Humeral surgical neck fracture 02/27/2022   Fall at home, initial encounter 02/27/2022   Transaminitis 02/27/2022   Dementia without behavioral disturbance ([place]) 02/27/2022   Hypomagnesemia 02/27/2022   Paroxysmal atrial fibrillation ([place]) 02/27/2022   Hypokalemia  03/03/2021   Leukocytosis 03/03/2021   Anemia 03/03/2021   Thrombocytopenia ([place]) 03/03/2021   Prolonged QT interval 03/03/2021   Dehydration 03/03/2021   COPD with acute exacerbation ([place]) 01/14/2021   Acute on chronic diastolic CHF (congestive heart failure) ([place]) 01/14/2021   Acute respiratory failure with hypoxia ([place]) 01/12/2021   Community acquired pneumonia 01/12/2021   Hyponatremia 01/12/2021   Alcohol dependence ([place]) 01/12/2021   Encounter to establish care 11/19/2020   Anxiety 11/19/2020   Chronic diastolic CHF (congestive heart failure) ([place]) 11/11/2020   Typical atrial flutter ([place]) 11/11/2020   Oropharyngeal dysphagia 08/13/2020   Screening for colorectal cancer 07/19/2018   Dry eye syndrome of both eyes 06/08/2018   Nuclear sclerotic cataract of both eyes 06/08/2018   Posterior vitreous detachment of both eyes 06/08/2018   Lung cancer, primary, with metastasis from lung to other site [facility]) 01/16/2018   Osseous metastasis 11/10/2017   Metastatic squamous cell carcinoma 10/20/2017   Tobacco abuse 12/14/2016   Osteopenia 05/15/2013   KNEE, ARTHRITIS, DEGEN./OSTEO 05/11/2010    ONSET DATE:  02/27/22  REFERRING DIAG:  Q59.563O (ICD-10-CM) - Other closed displaced fracture of proximal end of right humerus, initial encounter  THERAPY DIAG:  Acute pain of right shoulder  Stiffness of right shoulder, not elsewhere classified  Other symptoms and signs involving the musculoskeletal system  Rationale for Evaluation  and Treatment Rehabilitation  PERTINENT HISTORY: Pt reports falling on tile on 02/27/22 resulting in a severe right closed proximal humerus fracture. It was determined that she was not a candidate for surgery due multiple medical comorbidities. She was instructed to wear a sling and discontinued use on 03/24/22 per MD. Pt was referred to OT by Dr. Meredith Pel and instructed to return for a follow up apt following therapy (around 06/16/22).   PRECAUTIONS:  none  SUBJECTIVE: S: "I haven't been doing my exercises. It hurts a little"  PAIN:  Are you having pain? Yes: NPRS scale: 6/10 Pain location: whole shoulder  Pain description: throbbing  Aggravating factors: movement  Relieving factors: NA     OBJECTIVE:  HAND DOMINANCE: Right   ADLs: Overall ADLs: Pt reports requiring assistance with dressing. Her husband is currently doing the cooking and chores around the house as pt is unable to complete due to pain and decreased ROM.    FUNCTIONAL OUTCOME MEASURES: FOTO: 54.71   UPPER EXTREMITY ROM      Active ROM Right eval  Shoulder flexion 41  Shoulder abduction 43  Shoulder internal rotation 90  Shoulder external rotation 0  (Blank rows = not tested)   Passive ROM Right eval  Shoulder flexion 92  Shoulder abduction 53  Shoulder internal rotation 90  Shoulder external rotation 8  (Blank rows = not tested)   UPPER EXTREMITY MMT:      MMT Right eval  Shoulder flexion 3-/5  Shoulder abduction 3-/5  Shoulder internal rotation 4/5  Shoulder external rotation 3/5  (Blank rows = not tested)   HAND FUNCTION: Grip strength: Right: 15 lbs; Left: 29 lbs       COGNITION: Overall cognitive status: Impaired Required directions to be repeated several times. Some difficulty noted with recall.     GOALS: Goals reviewed with patient? Yes     LONG TERM GOALS: Target date: 06/18/22   Pt will be provided with and educated on HEP to improve mobility in RUE required for ADL completion   Goal status: IN PROGRESS    2.  Pt will decrease pain in RUE to 1/10 or less with movement in order to complete grooming/hygiene tasks without significant pain   Goal status: IN PROGRESS    3.  Pt will decrease RUE fascial restrictions to minimal amounts or less to improve mobility required for shoulder height reaching tasks such as putting away groceries.   Goal status: IN PROGRESS    4.  Pt will increase P/ROM in RUE to [facility] to improve  ability to perform dressing tasks with minimal compensatory techniques.    Goal status: IN PROGRESS    5.  Pt will increase A/ROM of RUE to [facility] to improve ability to reach shoulder height to retrieve clothing items from closet    Goal status: IN PROGRESS    6.  Pt will increase strength in RUE to 4/5 to improve ability to perform light lifting tasks required for housework such as putting dishes away.    Goal status: IN PROGRESS   TODAY'S TREATMENT:  05/13/22:  -Manual Therapy: Myofascial release, soft tissue mobilization, and trigger point completed to upper trapezius, pectoralis and biceps/triceps to decrease fascial restrictions and pain and allow for increased ROM -P/ROM: 1x15, supine, flexion, abduction, ir/ER -AA/ROM: 1x10, semi-reclined, flexion, abduction, ir/ER, protraction  -Scapular ROM, 1x10 seated retraction, 3 second hold   PATIENT EDUCATION: Education details: Reviewed AA/ROM Person educated: Patient and Spouse Education method: Explanation, Demonstration,  and Handouts Education comprehension: verbalized understanding and returned demonstration   HOME EXERCISE PROGRAM Eval: AA/ROM  ASSESSMENT:   CLINICAL IMPRESSION: A:Pt's husband reporting that  pt had been experiencing some increased pain today. Manual therapy completed with most restrictions at the upper trapezius and biceps region. Pt reporting tenderness but some relief. Transitioned to supine P/ROM, therapist cuing for deep breathing and to decrease muscle guarding. Increased ROM achieved with each repetition, about 50% flexion and abduction with final repetitions. Pt requesting a rest break halfway through due to pain.    PLAN:  OT FREQUENCY: 2x/week   OT DURATION: 6 weeks   PLANNED INTERVENTIONS: self care/ADL training, therapeutic exercise, therapeutic activity, manual therapy, passive range of motion, moist heat, cryotherapy, patient/family education, energy conservation, and DME and/or AE  instructions     CONSULTED AND AGREED WITH PLAN OF CARE: Patient   PLAN FOR NEXT SESSION: AA/ROM per protocol, pendulums, elbow/forearm ROM, grip strengthening. Add scapular ROM to HEP.       Flonnie Hailstone, [place], OTR/L [phone]  05/13/2022, 3:35 PM

## 2022-05-18 ENCOUNTER — Encounter (HOSPITAL_COMMUNITY): Payer: Self-pay | Admitting: Occupational Therapy

## 2022-05-18 ENCOUNTER — Other Ambulatory Visit: Payer: Self-pay | Admitting: *Deleted

## 2022-05-18 ENCOUNTER — Ambulatory Visit (HOSPITAL_COMMUNITY): Payer: Medicare Other | Admitting: Occupational Therapy

## 2022-05-18 DIAGNOSIS — M25611 Stiffness of right shoulder, not elsewhere classified: Secondary | ICD-10-CM

## 2022-05-18 DIAGNOSIS — R29898 Other symptoms and signs involving the musculoskeletal system: Secondary | ICD-10-CM

## 2022-05-18 DIAGNOSIS — M25511 Pain in right shoulder: Secondary | ICD-10-CM

## 2022-05-18 NOTE — Patient Outreach (Signed)
  Care Coordination   05/18/2022  Name: Cynthia Kelley MRN: [id number] DOB: [birth year]   Care Coordination Outreach Attempts:  An unsuccessful telephone outreach was attempted today to offer the patient information about available care coordination services as a benefit of their health plan. HIPAA compliant message left on voicemail, providing contact information for CSW, encouraging patient to return CSW's call at her earliest convenience.  Follow Up Plan:  Additional outreach attempts will be made to offer the patient care coordination information and services.   Encounter Outcome:  No Answer.   Care Coordination Interventions Activated:  No.    Care Coordination Interventions:  No, not indicated.    Nat Christen, BSW, MSW, LCSW  Licensed Education officer, [facility]  M[address] [place] [address] [place], [place] [address] Physical Address-[address], [place],  [address] Toll Free Main # [phone] Fax # [phone] Cell # [phone] [email or web address]

## 2022-05-18 NOTE — Therapy (Signed)
OUTPATIENT OCCUPATIONAL THERAPY TREATMENT NOTE   Patient Name: Cynthia Kelley MRN: [id number] DOB:[birth year], 66 y.o., female Today's Date: 05/18/2022  PCP: Ihor Dow, MD REFERRING PROVIDER: Meredith Pel, MD   OT End of Session - 05/18/22 1259     Visit Number 3    Number of Visits 12    Date for OT Re-Evaluation 06/18/22    Authorization Type [employer] Medicare    Authorization Time Period No visit limit, no auth    Progress Note Due on Visit 10    OT Start Time 1117    OT Stop Time 1158    OT Time Calculation (min) 41 min    Activity Tolerance Patient tolerated treatment well    Behavior During Therapy [facility] for tasks assessed/performed              Past Medical History:  Diagnosis Date   Acute diastolic congestive heart failure, NYHA class 2 ([place]) 11/11/2020   Anemia 11/22/2017   Anxiety 11/19/2020   Arthritis    Asymptomatic PVD (peripheral vascular disease) ([place]) 12/14/2016   Suspected.  Hands hyperemic with decreased cap refill. ? History vasospasm   COPD with acute exacerbation ([place]) 01/14/2021   FRACTURE, TIBIAL PLATEAU 05/11/2010   Qualifier: Diagnosis of  By: Aline Brochure MD, [place]     Lung cancer, primary, with metastasis from lung to other site [facility]) 01/16/2018   Metastatic to the liver   Nuclear sclerotic cataract of both eyes 06/08/2018   Osteopenia 05/15/2013   Osteoporosis    osteopenia   Tobacco abuse 12/14/2016   Past Surgical History:  Procedure Laterality Date   FRACTURE SURGERY     torn ACL   KNEE ARTHROSCOPY     Patient Active Problem List   Diagnosis Date Noted   Elevated liver enzymes    Chronic alcohol abuse    Long term current use of high dose acetaminophen    Humeral surgical neck fracture 02/27/2022   Fall at home, initial encounter 02/27/2022   Transaminitis 02/27/2022   Dementia without behavioral disturbance ([place]) 02/27/2022   Hypomagnesemia 02/27/2022   Paroxysmal atrial fibrillation ([place]) 02/27/2022   Hypokalemia  03/03/2021   Leukocytosis 03/03/2021   Anemia 03/03/2021   Thrombocytopenia ([place]) 03/03/2021   Prolonged QT interval 03/03/2021   Dehydration 03/03/2021   COPD with acute exacerbation ([place]) 01/14/2021   Acute on chronic diastolic CHF (congestive heart failure) ([place]) 01/14/2021   Acute respiratory failure with hypoxia ([place]) 01/12/2021   Community acquired pneumonia 01/12/2021   Hyponatremia 01/12/2021   Alcohol dependence ([place]) 01/12/2021   Encounter to establish care 11/19/2020   Anxiety 11/19/2020   Chronic diastolic CHF (congestive heart failure) ([place]) 11/11/2020   Typical atrial flutter ([place]) 11/11/2020   Oropharyngeal dysphagia 08/13/2020   Screening for colorectal cancer 07/19/2018   Dry eye syndrome of both eyes 06/08/2018   Nuclear sclerotic cataract of both eyes 06/08/2018   Posterior vitreous detachment of both eyes 06/08/2018   Lung cancer, primary, with metastasis from lung to other site [facility]) 01/16/2018   Osseous metastasis 11/10/2017   Metastatic squamous cell carcinoma 10/20/2017   Tobacco abuse 12/14/2016   Osteopenia 05/15/2013   KNEE, ARTHRITIS, DEGEN./OSTEO 05/11/2010    ONSET DATE:  02/27/22  REFERRING DIAG:  B51.025E (ICD-10-CM) - Other closed displaced fracture of proximal end of right humerus, initial encounter  THERAPY DIAG:  Acute pain of right shoulder  Stiffness of right shoulder, not elsewhere classified  Other symptoms and signs involving the musculoskeletal system  Rationale for  Evaluation and Treatment Rehabilitation  PERTINENT HISTORY: Pt reports falling on tile on 02/27/22 resulting in a severe right closed proximal humerus fracture. It was determined that she was not a candidate for surgery due multiple medical comorbidities. She was instructed to wear a sling and discontinued use on 03/24/22 per MD. Pt was referred to OT by Dr. Meredith Pel and instructed to return for a follow up apt following therapy (around 06/16/22).   PRECAUTIONS:  none  SUBJECTIVE: S: "I have been trying to limit all movement with my Right arm so that it doesn't hurt"  PAIN:  Are you having pain? Yes: NPRS scale: 6/10 Pain location: whole shoulder  Pain description: throbbing  Aggravating factors: movement  Relieving factors: NA     OBJECTIVE:  HAND DOMINANCE: Right   ADLs: Overall ADLs: Pt reports requiring assistance with dressing. Her husband is currently doing the cooking and chores around the house as pt is unable to complete due to pain and decreased ROM.    FUNCTIONAL OUTCOME MEASURES: FOTO: 54.71   UPPER EXTREMITY ROM      Active ROM Right eval  Shoulder flexion 41  Shoulder abduction 43  Shoulder internal rotation 90  Shoulder external rotation 0  (Blank rows = not tested)   Passive ROM Right eval  Shoulder flexion 92  Shoulder abduction 53  Shoulder internal rotation 90  Shoulder external rotation 8  (Blank rows = not tested)   UPPER EXTREMITY MMT:      MMT Right eval  Shoulder flexion 3-/5  Shoulder abduction 3-/5  Shoulder internal rotation 4/5  Shoulder external rotation 3/5  (Blank rows = not tested)   HAND FUNCTION: Grip strength: Right: 15 lbs; Left: 29 lbs       COGNITION: Overall cognitive status: Impaired Required directions to be repeated several times. Some difficulty noted with recall.     GOALS: Goals reviewed with patient? Yes     LONG TERM GOALS: Target date: 06/18/22   Pt will be provided with and educated on HEP to improve mobility in RUE required for ADL completion   Goal status: IN PROGRESS    2.  Pt will decrease pain in RUE to 1/10 or less with movement in order to complete grooming/hygiene tasks without significant pain   Goal status: IN PROGRESS    3.  Pt will decrease RUE fascial restrictions to minimal amounts or less to improve mobility required for shoulder height reaching tasks such as putting away groceries.   Goal status: IN PROGRESS    4.  Pt will increase  P/ROM in RUE to [facility] to improve ability to perform dressing tasks with minimal compensatory techniques.    Goal status: IN PROGRESS    5.  Pt will increase A/ROM of RUE to [facility] to improve ability to reach shoulder height to retrieve clothing items from closet    Goal status: IN PROGRESS    6.  Pt will increase strength in RUE to 4/5 to improve ability to perform light lifting tasks required for housework such as putting dishes away.    Goal status: IN PROGRESS   TODAY'S TREATMENT:  05/18/22 -Manual Therapy: Myofascial release, soft tissue mobilization, and trigger point completed to upper trapezius, pectoralis and biceps/triceps to decrease fascial restrictions and pain and allow for increased ROM -P/ROM: 1x15, supine, flexion, abduction, ir/ER -AA/ROM: 1x10 dowel rod, seated, Flexion, abduction, ir/ER - max verbal cues for positioning and OT assisting in guiding the dowel rod in the right direction.  -  A/ROM: 1x5, seated, flexion, max verbal cuing for proper body mechanics -Scapular ROM: 1x10 retraction, standing against wall -Wall crawls: 1x5, flexion and abduction, max multimodal cuing for technique, body positioning, and facilitating movement -Pendulums: 1x60 seconds - 20 sec forwards and back, 20 sec side to side, 20 sec small circles  05/13/22:  -Manual Therapy: Myofascial release, soft tissue mobilization, and trigger point completed to upper trapezius, pectoralis and biceps/triceps to decrease fascial restrictions and pain and allow for increased ROM -P/ROM: 1x15, supine, flexion, abduction, ir/ER -AA/ROM: 1x10, semi-reclined, flexion, abduction, ir/ER, protraction  -Scapular ROM, 1x10 seated retraction, 3 second hold   PATIENT EDUCATION: Education details: Reviewed AA/ROM Person educated: Patient and Spouse Education method: Consulting civil engineer, Media planner, and Handouts Education comprehension: verbalized understanding and returned demonstration   HOME EXERCISE PROGRAM Eval:  AA/ROM 8/22: Pendulums  ASSESSMENT:   CLINICAL IMPRESSION: A: Pt reporting that she hasn't been having much pain unless she moves her RUE. Pt reporting that she limits moving her RUE as much as possible. Continued tightness through bicep and pectoralis muscles, however pt reporting no pain or soreness in these areas. She demonstrated improved PROM in flexion, abduction, and ER. With AA/ROM and A/ROM, pt requires visual aide, verbal cuing, and tactile cuing to complete tasks. She will benefit from functional tasks to address reaching and ROM. Per pt report, pain has not been increased this session with activity.    PLAN:  OT FREQUENCY: 2x/week   OT DURATION: 6 weeks   PLANNED INTERVENTIONS: self care/ADL training, therapeutic exercise, therapeutic activity, manual therapy, passive range of motion, moist heat, cryotherapy, patient/family education, energy conservation, and DME and/or AE instructions     CONSULTED AND AGREED WITH PLAN OF CARE: Patient   PLAN FOR NEXT SESSION: AA/ROM per protocol, pendulums, elbow/forearm ROM, grip strengthening. Add scapular ROM to HEP.       Paulita Fujita, OTR/L [phone]  05/18/2022, 1:02 PM

## 2022-05-20 ENCOUNTER — Encounter (HOSPITAL_COMMUNITY): Payer: Medicare Other

## 2022-05-24 ENCOUNTER — Ambulatory Visit (HOSPITAL_COMMUNITY): Payer: Medicare Other

## 2022-05-24 ENCOUNTER — Encounter (HOSPITAL_COMMUNITY): Payer: Self-pay

## 2022-05-24 DIAGNOSIS — M25511 Pain in right shoulder: Secondary | ICD-10-CM

## 2022-05-24 DIAGNOSIS — M25611 Stiffness of right shoulder, not elsewhere classified: Secondary | ICD-10-CM

## 2022-05-24 DIAGNOSIS — R29898 Other symptoms and signs involving the musculoskeletal system: Secondary | ICD-10-CM | POA: Diagnosis not present

## 2022-05-24 NOTE — Therapy (Signed)
OUTPATIENT OCCUPATIONAL THERAPY TREATMENT NOTE   Patient Name: Cynthia Kelley MRN: [id number] DOB:[birth year], 66 y.o., female Today's Date: 05/24/2022  PCP: Ihor Dow, MD REFERRING PROVIDER: Meredith Pel, MD   OT End of Session - 05/24/22 [phone]     Visit Number 4    Number of Visits 12    Date for OT Re-Evaluation 06/18/22    Authorization Type [employer] Medicare    Authorization Time Period No visit limit, no auth    Progress Note Due on Visit 10    OT Start Time 0945    OT Stop Time 1023    OT Time Calculation (min) 38 min    Activity Tolerance Patient tolerated treatment well    Behavior During Therapy [facility] for tasks assessed/performed              Past Medical History:  Diagnosis Date   Acute diastolic congestive heart failure, NYHA class 2 ([place]) 11/11/2020   Anemia 11/22/2017   Anxiety 11/19/2020   Arthritis    Asymptomatic PVD (peripheral vascular disease) ([place]) 12/14/2016   Suspected.  Hands hyperemic with decreased cap refill. ? History vasospasm   COPD with acute exacerbation ([place]) 01/14/2021   FRACTURE, TIBIAL PLATEAU 05/11/2010   Qualifier: Diagnosis of  By: Aline Brochure MD, [place]     Lung cancer, primary, with metastasis from lung to other site [facility]) 01/16/2018   Metastatic to the liver   Nuclear sclerotic cataract of both eyes 06/08/2018   Osteopenia 05/15/2013   Osteoporosis    osteopenia   Tobacco abuse 12/14/2016   Past Surgical History:  Procedure Laterality Date   FRACTURE SURGERY     torn ACL   KNEE ARTHROSCOPY     Patient Active Problem List   Diagnosis Date Noted   Elevated liver enzymes    Chronic alcohol abuse    Long term current use of high dose acetaminophen    Humeral surgical neck fracture 02/27/2022   Fall at home, initial encounter 02/27/2022   Transaminitis 02/27/2022   Dementia without behavioral disturbance ([place]) 02/27/2022   Hypomagnesemia 02/27/2022   Paroxysmal atrial fibrillation ([place]) 02/27/2022   Hypokalemia  03/03/2021   Leukocytosis 03/03/2021   Anemia 03/03/2021   Thrombocytopenia ([place]) 03/03/2021   Prolonged QT interval 03/03/2021   Dehydration 03/03/2021   COPD with acute exacerbation ([place]) 01/14/2021   Acute on chronic diastolic CHF (congestive heart failure) ([place]) 01/14/2021   Acute respiratory failure with hypoxia ([place]) 01/12/2021   Community acquired pneumonia 01/12/2021   Hyponatremia 01/12/2021   Alcohol dependence ([place]) 01/12/2021   Encounter to establish care 11/19/2020   Anxiety 11/19/2020   Chronic diastolic CHF (congestive heart failure) ([place]) 11/11/2020   Typical atrial flutter ([place]) 11/11/2020   Oropharyngeal dysphagia 08/13/2020   Screening for colorectal cancer 07/19/2018   Dry eye syndrome of both eyes 06/08/2018   Nuclear sclerotic cataract of both eyes 06/08/2018   Posterior vitreous detachment of both eyes 06/08/2018   Lung cancer, primary, with metastasis from lung to other site [facility]) 01/16/2018   Osseous metastasis 11/10/2017   Metastatic squamous cell carcinoma 10/20/2017   Tobacco abuse 12/14/2016   Osteopenia 05/15/2013   KNEE, ARTHRITIS, DEGEN./OSTEO 05/11/2010    ONSET DATE:  02/27/22  REFERRING DIAG:  H37.169C (ICD-10-CM) - Other closed displaced fracture of proximal end of right humerus, initial encounter  THERAPY DIAG:  Acute pain of right shoulder  Stiffness of right shoulder, not elsewhere classified  Other symptoms and signs involving the musculoskeletal system  Rationale for  Evaluation and Treatment Rehabilitation  PERTINENT HISTORY: Pt reports falling on tile on 02/27/22 resulting in a severe right closed proximal humerus fracture. It was determined that she was not a candidate for surgery due multiple medical comorbidities. She was instructed to wear a sling and discontinued use on 03/24/22 per MD. Pt was referred to OT by Dr. Meredith Pel and instructed to return for a follow up apt following therapy (around 06/16/22).   PRECAUTIONS:  none  SUBJECTIVE: S: "I don't use this arm much at home."  PAIN:  Are you having pain? Yes: NPRS scale: 1/10 Pain location: whole shoulder  Pain description: throbbing  Aggravating factors: movement  Relieving factors: NA     OBJECTIVE:  HAND DOMINANCE: Right   ADLs: Overall ADLs: Pt reports requiring assistance with dressing. Her husband is currently doing the cooking and chores around the house as pt is unable to complete due to pain and decreased ROM.    FUNCTIONAL OUTCOME MEASURES: FOTO: 54.71   UPPER EXTREMITY ROM      Active ROM Right eval  Shoulder flexion 41  Shoulder abduction 43  Shoulder internal rotation 90  Shoulder external rotation 0  (Blank rows = not tested)   Passive ROM Right eval  Shoulder flexion 92  Shoulder abduction 53  Shoulder internal rotation 90  Shoulder external rotation 8  (Blank rows = not tested)   UPPER EXTREMITY MMT:      MMT Right eval  Shoulder flexion 3-/5  Shoulder abduction 3-/5  Shoulder internal rotation 4/5  Shoulder external rotation 3/5  (Blank rows = not tested)   HAND FUNCTION: Grip strength: Right: 15 lbs; Left: 29 lbs       COGNITION: Overall cognitive status: Impaired Required directions to be repeated several times. Some difficulty noted with recall.     GOALS: Goals reviewed with patient? Yes     LONG TERM GOALS: Target date: 06/18/22   Pt will be provided with and educated on HEP to improve mobility in RUE required for ADL completion   Goal status: IN PROGRESS    2.  Pt will decrease pain in RUE to 1/10 or less with movement in order to complete grooming/hygiene tasks without significant pain   Goal status: IN PROGRESS    3.  Pt will decrease RUE fascial restrictions to minimal amounts or less to improve mobility required for shoulder height reaching tasks such as putting away groceries.   Goal status: IN PROGRESS    4.  Pt will increase P/ROM in RUE to [facility] to improve ability to perform  dressing tasks with minimal compensatory techniques.    Goal status: IN PROGRESS    5.  Pt will increase A/ROM of RUE to [facility] to improve ability to reach shoulder height to retrieve clothing items from closet    Goal status: IN PROGRESS    6.  Pt will increase strength in RUE to 4/5 to improve ability to perform light lifting tasks required for housework such as putting dishes away.    Goal status: IN PROGRESS   TODAY'S TREATMENT:   05/24/22 -P/ROM: 1x15, supine, flexion, abduction, ir/ER -AA/ROM: 1x10 dowel rod, supine, Flexion, abduction, protraction, ir/ER - mod-max verbal cues for positioning and OT assisting in guiding the dowel rod in the right direction.  -A/ROM: 1x5, supine, flexion, mod verbal cuing for proper body mechanics  -Functional Reach: 2x reaching to bottom shelf to retrieve items, placing on counter top, returning to cabinet.  -Functional Reach: standing at  table, placing and removing red, yellow, and green clothespins from pole. -Wall wash: 2x10 forward flexion -Pendulums: 1x60 seconds - 20 sec forwards and back, 20 sec side to side, 20 sec small circles   05/18/22 -Manual Therapy: Myofascial release, soft tissue mobilization, and trigger point completed to upper trapezius, pectoralis and biceps/triceps to decrease fascial restrictions and pain and allow for increased ROM -P/ROM: 1x15, supine, flexion, abduction, ir/ER -AA/ROM: 1x10 dowel rod, seated, Flexion, abduction, ir/ER - max verbal cues for positioning and OT assisting in guiding the dowel rod in the right direction.  -A/ROM: 1x5, seated, flexion, max verbal cuing for proper body mechanics -Scapular ROM: 1x10 retraction, standing against wall -Wall crawls: 1x5, flexion and abduction, max multimodal cuing for technique, body positioning, and facilitating movement -Pendulums: 1x60 seconds - 20 sec forwards and back, 20 sec side to side, 20 sec small circles  05/13/22:  -Manual Therapy: Myofascial release, soft  tissue mobilization, and trigger point completed to upper trapezius, pectoralis and biceps/triceps to decrease fascial restrictions and pain and allow for increased ROM -P/ROM: 1x15, supine, flexion, abduction, ir/ER -AA/ROM: 1x10, semi-reclined, flexion, abduction, ir/ER, protraction  -Scapular ROM, 1x10 seated retraction, 3 second hold   PATIENT EDUCATION: Education details: Reviewed AA/ROM Person educated: Patient and Spouse Education method: Consulting civil engineer, Media planner, and Handouts Education comprehension: verbalized understanding and returned demonstration   HOME EXERCISE PROGRAM Eval: AA/ROM 8/22: Pendulums  ASSESSMENT:   CLINICAL IMPRESSION: A: Pt continues to report that she avoids using her RUE at home, discussed the importance of using the RUE for functional tasks around the home. Slight improvement with form today, although still requiring moderate-max therapist tactile and verbal cuing for form. Pt reporting a "throbbing" with exercises today and poor endurance. More emphasis on functional reach today to increase pt engagement and tolerance of session.    PLAN:  OT FREQUENCY: 2x/week   OT DURATION: 6 weeks   PLANNED INTERVENTIONS: self care/ADL training, therapeutic exercise, therapeutic activity, manual therapy, passive range of motion, moist heat, cryotherapy, patient/family education, energy conservation, and DME and/or AE instructions     CONSULTED AND AGREED WITH PLAN OF CARE: Patient   PLAN FOR NEXT SESSION: AA/ROM per protocol, pendulums, elbow/forearm ROM, grip strengthening. Add scapular ROM to HEP.      Flonnie Hailstone, [place], OTR/L [phone]  05/24/2022, 10:27 AM

## 2022-05-25 DIAGNOSIS — C7951 Secondary malignant neoplasm of bone: Secondary | ICD-10-CM | POA: Diagnosis not present

## 2022-05-25 DIAGNOSIS — R9389 Abnormal findings on diagnostic imaging of other specified body structures: Secondary | ICD-10-CM | POA: Diagnosis not present

## 2022-05-25 DIAGNOSIS — Z9221 Personal history of antineoplastic chemotherapy: Secondary | ICD-10-CM | POA: Diagnosis not present

## 2022-05-25 DIAGNOSIS — Z85118 Personal history of other malignant neoplasm of bronchus and lung: Secondary | ICD-10-CM | POA: Diagnosis not present

## 2022-05-25 DIAGNOSIS — C3491 Malignant neoplasm of unspecified part of right bronchus or lung: Secondary | ICD-10-CM | POA: Diagnosis not present

## 2022-05-25 DIAGNOSIS — I5032 Chronic diastolic (congestive) heart failure: Secondary | ICD-10-CM | POA: Diagnosis not present

## 2022-05-25 DIAGNOSIS — Z923 Personal history of irradiation: Secondary | ICD-10-CM | POA: Diagnosis not present

## 2022-05-25 DIAGNOSIS — C3431 Malignant neoplasm of lower lobe, right bronchus or lung: Secondary | ICD-10-CM | POA: Diagnosis not present

## 2022-05-26 ENCOUNTER — Encounter (HOSPITAL_COMMUNITY): Payer: Self-pay | Admitting: Occupational Therapy

## 2022-05-26 ENCOUNTER — Ambulatory Visit (HOSPITAL_COMMUNITY): Payer: Medicare Other | Admitting: Occupational Therapy

## 2022-05-26 DIAGNOSIS — R29898 Other symptoms and signs involving the musculoskeletal system: Secondary | ICD-10-CM | POA: Diagnosis not present

## 2022-05-26 DIAGNOSIS — M25611 Stiffness of right shoulder, not elsewhere classified: Secondary | ICD-10-CM

## 2022-05-26 DIAGNOSIS — M25511 Pain in right shoulder: Secondary | ICD-10-CM | POA: Diagnosis not present

## 2022-05-26 NOTE — Therapy (Signed)
OUTPATIENT OCCUPATIONAL THERAPY TREATMENT NOTE   Patient Name: Cynthia Kelley MRN: [id number] DOB:1956-09-12, 66 y.o., female Today's Date: 05/26/2022  PCP: Ihor Dow, MD REFERRING PROVIDER: Meredith Pel, MD   OT End of Session - 05/26/22 1202     Visit Number 5    Number of Visits 12    Date for OT Re-Evaluation 06/18/22    Authorization Type UHC Medicare    Authorization Time Period No visit limit, no auth    Progress Note Due on Visit 10    OT Start Time 1031    OT Stop Time 1114    OT Time Calculation (min) 43 min    Activity Tolerance Patient tolerated treatment well    Behavior During Therapy [place] for tasks assessed/performed               Past Medical History:  Diagnosis Date   Acute diastolic congestive heart failure, NYHA class 2 ([place]) 11/11/2020   Anemia 11/22/2017   Anxiety 11/19/2020   Arthritis    Asymptomatic PVD (peripheral vascular disease) ([place]) 12/14/2016   Suspected.  Hands hyperemic with decreased cap refill. ? History vasospasm   COPD with acute exacerbation ([place]) 01/14/2021   FRACTURE, TIBIAL PLATEAU 05/11/2010   Qualifier: Diagnosis of  By: Aline Brochure MD, [place]     Lung cancer, primary, with metastasis from lung to other site [facility]) 01/16/2018   Metastatic to the liver   Nuclear sclerotic cataract of both eyes 06/08/2018   Osteopenia 05/15/2013   Osteoporosis    osteopenia   Tobacco abuse 12/14/2016   Past Surgical History:  Procedure Laterality Date   FRACTURE SURGERY     torn ACL   KNEE ARTHROSCOPY     Patient Active Problem List   Diagnosis Date Noted   Elevated liver enzymes    Chronic alcohol abuse    Long term current use of high dose acetaminophen    Humeral surgical neck fracture 02/27/2022   Fall at home, initial encounter 02/27/2022   Transaminitis 02/27/2022   Dementia without behavioral disturbance ([place]) 02/27/2022   Hypomagnesemia 02/27/2022   Paroxysmal atrial fibrillation ([place]) 02/27/2022   Hypokalemia  03/03/2021   Leukocytosis 03/03/2021   Anemia 03/03/2021   Thrombocytopenia ([place]) 03/03/2021   Prolonged QT interval 03/03/2021   Dehydration 03/03/2021   COPD with acute exacerbation ([place]) 01/14/2021   Acute on chronic diastolic CHF (congestive heart failure) ([place]) 01/14/2021   Acute respiratory failure with hypoxia ([place]) 01/12/2021   Community acquired pneumonia 01/12/2021   Hyponatremia 01/12/2021   Alcohol dependence ([place]) 01/12/2021   Encounter to establish care 11/19/2020   Anxiety 11/19/2020   Chronic diastolic CHF (congestive heart failure) ([place]) 11/11/2020   Typical atrial flutter ([place]) 11/11/2020   Oropharyngeal dysphagia 08/13/2020   Screening for colorectal cancer 07/19/2018   Dry eye syndrome of both eyes 06/08/2018   Nuclear sclerotic cataract of both eyes 06/08/2018   Posterior vitreous detachment of both eyes 06/08/2018   Lung cancer, primary, with metastasis from lung to other site [facility]) 01/16/2018   Osseous metastasis 11/10/2017   Metastatic squamous cell carcinoma 10/20/2017   Tobacco abuse 12/14/2016   Osteopenia 05/15/2013   KNEE, ARTHRITIS, DEGEN./OSTEO 05/11/2010    ONSET DATE:  02/27/22  REFERRING DIAG:  E52.778E (ICD-10-CM) - Other closed displaced fracture of proximal end of right humerus, initial encounter  THERAPY DIAG:  Acute pain of right shoulder  Stiffness of right shoulder, not elsewhere classified  Other symptoms and signs involving the musculoskeletal system  Rationale  for Evaluation and Treatment Rehabilitation  PERTINENT HISTORY: Pt reports falling on tile on 02/27/22 resulting in a severe right closed proximal humerus fracture. It was determined that she was not a candidate for surgery due multiple medical comorbidities. She was instructed to wear a sling and discontinued use on 03/24/22 per MD. Pt was referred to OT by Dr. Meredith Pel and instructed to return for a follow up apt following therapy (around 06/16/22).   PRECAUTIONS:  none  SUBJECTIVE: S: "I'm lazy and I try not to use my arm as much as possible"  PAIN:  Are you having pain? No     OBJECTIVE:  HAND DOMINANCE: Right   ADLs: Overall ADLs: Pt reports requiring assistance with dressing. Her husband is currently doing the cooking and chores around the house as pt is unable to complete due to pain and decreased ROM.    FUNCTIONAL OUTCOME MEASURES: FOTO: 54.71   UPPER EXTREMITY ROM      Active ROM Right eval  Shoulder flexion 41  Shoulder abduction 43  Shoulder internal rotation 90  Shoulder external rotation 0  (Blank rows = not tested)   Passive ROM Right eval  Shoulder flexion 92  Shoulder abduction 53  Shoulder internal rotation 90  Shoulder external rotation 8  (Blank rows = not tested)   UPPER EXTREMITY MMT:      MMT Right eval  Shoulder flexion 3-/5  Shoulder abduction 3-/5  Shoulder internal rotation 4/5  Shoulder external rotation 3/5  (Blank rows = not tested)   HAND FUNCTION: Grip strength: Right: 15 lbs; Left: 29 lbs       COGNITION: Overall cognitive status: Impaired Required directions to be repeated several times. Some difficulty noted with recall.     GOALS: Goals reviewed with patient? Yes     LONG TERM GOALS: Target date: 06/18/22   Pt will be provided with and educated on HEP to improve mobility in RUE required for ADL completion   Goal status: IN PROGRESS    2.  Pt will decrease pain in RUE to 1/10 or less with movement in order to complete grooming/hygiene tasks without significant pain   Goal status: IN PROGRESS    3.  Pt will decrease RUE fascial restrictions to minimal amounts or less to improve mobility required for shoulder height reaching tasks such as putting away groceries.   Goal status: IN PROGRESS    4.  Pt will increase P/ROM in RUE to [id number] to improve ability to perform dressing tasks with minimal compensatory techniques.    Goal status: IN PROGRESS    5.  Pt will increase A/ROM  of RUE to [facility] to improve ability to reach shoulder height to retrieve clothing items from closet    Goal status: IN PROGRESS    6.  Pt will increase strength in RUE to 4/5 to improve ability to perform light lifting tasks required for housework such as putting dishes away.    Goal status: IN PROGRESS   TODAY'S TREATMENT:   05/26/22 -Manual Therapy: Myofascial release, soft tissue mobilization, and trigger point completed to upper trapezius, pectoralis and biceps/triceps to decrease fascial restrictions and pain and allow for increased ROM -P/ROM: supine, flexion, abduction, horizontal abduction, er/IR, 1x10 with prolonged stretches at end range -AA/ROM: supine, flexion, abduction, protraction, er/IR, 1x10, needs rest breaks each exercise after 5 reps -Wall Wash: flexion and abduction, 1x10, mod verbal cuing for proper body mechanics, max tactile cuing for reducing shoulder hiking.  -ball stretch:  flexion and abduction, 1x10  05/24/22 -P/ROM: 1x15, supine, flexion, abduction, ir/ER -AA/ROM: 1x10 dowel rod, supine, Flexion, abduction, protraction, ir/ER - mod-max verbal cues for positioning and OT assisting in guiding the dowel rod in the right direction.  -A/ROM: 1x5, supine, flexion, mod verbal cuing for proper body mechanics  -Functional Reach: 2x reaching to bottom shelf to retrieve items, placing on counter top, returning to cabinet.  -Functional Reach: standing at table, placing and removing red, yellow, and green clothespins from pole. -Wall wash: 2x10 forward flexion -Pendulums: 1x60 seconds - 20 sec forwards and back, 20 sec side to side, 20 sec small circles  05/18/22 -Manual Therapy: Myofascial release, soft tissue mobilization, and trigger point completed to upper trapezius, pectoralis and biceps/triceps to decrease fascial restrictions and pain and allow for increased ROM -P/ROM: 1x15, supine, flexion, abduction, ir/ER -AA/ROM: 1x10 dowel rod, seated, Flexion, abduction, ir/ER -  max verbal cues for positioning and OT assisting in guiding the dowel rod in the right direction.  -A/ROM: 1x5, seated, flexion, max verbal cuing for proper body mechanics -Scapular ROM: 1x10 retraction, standing against wall -Wall crawls: 1x5, flexion and abduction, max multimodal cuing for technique, body positioning, and facilitating movement -Pendulums: 1x60 seconds - 20 sec forwards and back, 20 sec side to side, 20 sec small circles  PATIENT EDUCATION: Education details: AA/ROM Person educated: Patient and Spouse Education method: Consulting civil engineer, Media planner, and Handouts Education comprehension: verbalized understanding and returned demonstration   [place]: AA/ROM 8/22: Pendulums 8/30: reviewed AA/ROM with pt and husband encouraging daily movement  ASSESSMENT:   CLINICAL IMPRESSION: A: Pt continues to report that she avoids using her RUE as much as possible due to pain. This session she had significant fascial restrictions noted in the biceps, axillary muscles, and scapula, addressed with manual therapy. P/ROM and AA/ROM equal this session, however still limited due to pain. Focused on end range prolonged stretching, to reduce pain and tension. She continues to require mod verbal and tactile cuing for proper body mechanics, positioning, and decreasing guarding compensation. Pt requiring rest breaks during each AA/ROM exercises due to muscle fatigue/soreness. Therapist spoke with pt and husband, emphasizing importance of using RUE throughout each day, pushing through soreness, and following HEP to progress A/ROM and reduce overall pain.   PLAN:  OT FREQUENCY: 2x/week   OT DURATION: 6 weeks   PLANNED INTERVENTIONS: self care/ADL training, therapeutic exercise, therapeutic activity, manual therapy, passive range of motion, moist heat, cryotherapy, patient/family education, energy conservation, and DME and/or AE instructions     CONSULTED AND AGREED WITH PLAN OF  CARE: Patient   PLAN FOR NEXT SESSION: AA/ROM per protocol, pendulums, grip strengthening. Add scapular ROM to HEP.      Paulita Fujita, OTR/L [phone]  05/26/2022, 12:04 PM

## 2022-05-26 NOTE — Patient Instructions (Signed)

## 2022-06-01 ENCOUNTER — Encounter (HOSPITAL_COMMUNITY): Payer: Medicare Other

## 2022-06-03 ENCOUNTER — Encounter (HOSPITAL_COMMUNITY): Payer: Self-pay

## 2022-06-03 ENCOUNTER — Ambulatory Visit (HOSPITAL_COMMUNITY): Payer: Medicare Other | Attending: Orthopedic Surgery

## 2022-06-03 DIAGNOSIS — M25611 Stiffness of right shoulder, not elsewhere classified: Secondary | ICD-10-CM | POA: Insufficient documentation

## 2022-06-03 DIAGNOSIS — M25511 Pain in right shoulder: Secondary | ICD-10-CM | POA: Insufficient documentation

## 2022-06-03 DIAGNOSIS — R29898 Other symptoms and signs involving the musculoskeletal system: Secondary | ICD-10-CM | POA: Insufficient documentation

## 2022-06-03 NOTE — Therapy (Signed)
OUTPATIENT OCCUPATIONAL THERAPY TREATMENT NOTE And Mini-Reassessment    Patient Name: Cynthia Kelley MRN: [id number] DOB:[birth year], 66 y.o., female Today's Date: 06/03/2022  PCP: Ihor Dow, MD REFERRING PROVIDER: Meredith Pel, MD   OT End of Session - 06/03/22 0950     Visit Number 6    Number of Visits 12    Date for OT Re-Evaluation 06/18/22    Authorization Type UHC Medicare    Authorization Time Period No visit limit, no auth    Progress Note Due on Visit 10    OT Start Time [phone]    OT Stop Time 1021    OT Time Calculation (min) 35 min    Activity Tolerance Patient tolerated treatment well    Behavior During Therapy [facility] for tasks assessed/performed               Past Medical History:  Diagnosis Date   Acute diastolic congestive heart failure, NYHA class 2 ([place]) 11/11/2020   Anemia 11/22/2017   Anxiety 11/19/2020   Arthritis    Asymptomatic PVD (peripheral vascular disease) ([place]) 12/14/2016   Suspected.  Hands hyperemic with decreased cap refill. ? History vasospasm   COPD with acute exacerbation ([place]) 01/14/2021   FRACTURE, TIBIAL PLATEAU 05/11/2010   Qualifier: Diagnosis of  By: Aline Brochure MD, [place]     Lung cancer, primary, with metastasis from lung to other site [facility]) 01/16/2018   Metastatic to the liver   Nuclear sclerotic cataract of both eyes 06/08/2018   Osteopenia 05/15/2013   Osteoporosis    osteopenia   Tobacco abuse 12/14/2016   Past Surgical History:  Procedure Laterality Date   FRACTURE SURGERY     torn ACL   KNEE ARTHROSCOPY     Patient Active Problem List   Diagnosis Date Noted   Elevated liver enzymes    Chronic alcohol abuse    Long term current use of high dose acetaminophen    Humeral surgical neck fracture 02/27/2022   Fall at home, initial encounter 02/27/2022   Transaminitis 02/27/2022   Dementia without behavioral disturbance (Cyn[place] Kelley) 02/27/2022   Hypomagnesemia 02/27/2022   Paroxysmal atrial fibrillation (Cynthia Kelley)  02/27/2022   Hypokalemia 03/03/2021   Leukocytosis 03/03/2021   Anemia 03/03/2021   Thrombocytopenia (Cynthia Kelley) 03/03/2021   Prolonged QT interval 03/03/2021   Dehydration 03/03/2021   COPD with acute exacerbation ([place]) 01/14/2021   Acute on chronic diastolic CHF (congestive heart failure) (Cyn[place] Kelley) 01/14/2021   Acute respiratory failure with hypoxia (Cyn[place] Kelley) 01/12/2021   Community acquired pneumonia 01/12/2021   Hyponatremia 01/12/2021   Alcohol dependence (Cyn[place] Kelley) 01/12/2021   Encounter to establish care 11/19/2020   Anxiety 11/19/2020   Chronic diastolic CHF (congestive heart failure) (Cyn[place] Kelley) 11/11/2020   Typical atrial flutter ([place]yn[place] Kelley) 11/11/2020   Oropharyngeal dysphagia 08/13/2020   Screening for colorectal cancer 07/19/2018   Dry eye syndrome of both eyes 06/08/2018   Nuclear sclerotic cataract of both eyes 06/08/2018   Posterior vitreous detachment of both eyes 06/08/2018   Lung cancer, primary, with metastasis from lung to other site Cynthia Kelley) 01/16/2018   Osseous metastasis 11/10/2017   Metastatic squamous cell carcinoma 10/20/2017   Tobacco abuse 12/14/2016   Osteopenia 05/15/2013   KNEE, ARTHRITIS, DEGEN./OSTEO 05/11/2010    ONSET DATE:  02/27/22  REFERRING DIAG:  V89.381O (ICD-10-CM) - Other closed displaced fracture of proximal end of right humerus, initial encounter  THERAPY DIAG:  Acute pain of right shoulder  Stiffness of right shoulder, not elsewhere classified  Other symptoms and signs involving the musculoskeletal  system  Rationale for Evaluation and Treatment Rehabilitation  PERTINENT HISTORY: Pt reports falling on tile on 02/27/22 resulting in a severe right closed proximal humerus fracture. It was determined that she was not a candidate for surgery due multiple medical comorbidities. She was instructed to wear a sling and discontinued use on 03/24/22 per MD. Pt was referred to OT by Dr. Meredith Pel and instructed to return for a follow up apt following therapy (around  06/16/22).   PRECAUTIONS: none  SUBJECTIVE: S: "I haven't used my arm. I'm lazy. It hurts when I use it."  PAIN:  Are you having pain? No     OBJECTIVE:  HAND DOMINANCE: Right   ADLs: Overall ADLs: Pt reports requiring assistance with dressing. Her husband is currently doing the cooking and chores around the house as pt is unable to complete due to pain and decreased ROM.    FUNCTIONAL OUTCOME MEASURES: FOTO: 54.71   UPPER EXTREMITY ROM      Active ROM Right eval Right  9/7  Shoulder flexion 41 59  Shoulder abduction 43 58  Shoulder internal rotation 90 90  Shoulder external rotation 0 8  (Blank rows = not tested)   Passive ROM Right eval Right  9/7  Shoulder flexion 92 85  Shoulder abduction 53 70  Shoulder internal rotation 90 90  Shoulder external rotation 8 8  (Blank rows = not tested)   UPPER EXTREMITY MMT:      MMT Right eval Right  9/7  Shoulder flexion 3-/5 3-/5  Shoulder abduction 3-/5 3-/5  Shoulder internal rotation 4/5 4/5  Shoulder external rotation 3/5 3/5  (Blank rows = not tested)   HAND FUNCTION: Grip strength: Right: 15 lbs; Left: 29 lbs  Right: 25 lbs       COGNITION: Overall cognitive status: Impaired Required directions to be repeated several times. Some difficulty noted with recall.     GOALS: Goals reviewed with patient? Yes     LONG TERM GOALS: Target date: 06/18/22   Pt will be provided with and educated on HEP to improve mobility in RUE required for ADL completion   Goal status: IN PROGRESS    2.  Pt will decrease pain in RUE to 1/10 or less with movement in order to complete grooming/hygiene tasks without significant pain   Goal status: IN PROGRESS    3.  Pt will decrease RUE fascial restrictions to minimal amounts or less to improve mobility required for shoulder height reaching tasks such as putting away groceries.   Goal status: IN PROGRESS    4.  Pt will increase P/ROM in RUE to [facility] to improve ability to  perform dressing tasks with minimal compensatory techniques.    Goal status: IN PROGRESS    5.  Pt will increase A/ROM of RUE to [facility] to improve ability to reach shoulder height to retrieve clothing items from closet    Goal status: IN PROGRESS    6.  Pt will increase strength in RUE to 4/5 to improve ability to perform light lifting tasks required for housework such as putting dishes away.    Goal status: IN PROGRESS   TODAY'S TREATMENT:   06/03/22 -Manual Therapy: Myofascial release, soft tissue mobilization, and trigger point completed to upper trapezius, pectoralis and biceps/triceps to decrease fascial restrictions and pain and allow for increased ROM -P/ROM: supine, flexion, abduction, horizontal abduction, er/IR, 1x5 with prolonged stretches at end range -Functional reach: 3x pt retrieving items from bottom cabinet, placing on  counter, returning to bottom cabinet   -UBE: 3 mins forward, level 1 -Functional Reach: [place] ring tree, green branch x8, blue branch x8    05/26/22 -Manual Therapy: Myofascial release, soft tissue mobilization, and trigger point completed to upper trapezius, pectoralis and biceps/triceps to decrease fascial restrictions and pain and allow for increased ROM -P/ROM: supine, flexion, abduction, horizontal abduction, er/IR, 1x10 with prolonged stretches at end range -AA/ROM: supine, flexion, abduction, protraction, er/IR, 1x10, needs rest breaks each exercise after 5 reps -Wall Wash: flexion and abduction, 1x10, mod verbal cuing for proper body mechanics, max tactile cuing for reducing shoulder hiking.  -ball stretch: flexion and abduction, 1x10  05/24/22 -P/ROM: 1x15, supine, flexion, abduction, ir/ER -AA/ROM: 1x10 dowel rod, supine, Flexion, abduction, protraction, ir/ER - mod-max verbal cues for positioning and OT assisting in guiding the dowel rod in the right direction.  -A/ROM: 1x5, supine, flexion, mod verbal cuing for proper body mechanics  -Functional  Reach: 2x reaching to bottom shelf to retrieve items, placing on counter top, returning to cabinet.  -Functional Reach: standing at table, placing and removing red, yellow, and green clothespins from pole. -Wall wash: 2x10 forward flexion -Pendulums: 1x60 seconds - 20 sec forwards and back, 20 sec side to side, 20 sec small circles   PATIENT EDUCATION: Education details: AA/ROM Person educated: Patient and Spouse Education method: Consulting civil engineer, Media planner, and Handouts Education comprehension: verbalized understanding and returned demonstration   HOME EXERCISE PROGRAM Eval: AA/ROM 8/22: Pendulums 8/30: reviewed AA/ROM with pt and husband encouraging daily movement  ASSESSMENT:   CLINICAL IMPRESSION: A: Pt and husband report that they have not been doing HEP and that pt continues to avoid using her right arm. Discussed the importance of daily movement and expectation that pt may not gain significantly more ROM. Working on strength and endurance within available range today. Pt requesting rest breaks with functional reach activities, moderate to significant compensation to allow for reach to bottom shelf and green branch. Pt not tolerating P/ROM end range stretch reporting increase pain.   PLAN:  OT FREQUENCY: 2x/week   OT DURATION: 6 weeks   PLANNED INTERVENTIONS: self care/ADL training, therapeutic exercise, therapeutic activity, manual therapy, passive range of motion, moist heat, cryotherapy, patient/family education, energy conservation, and DME and/or AE instructions     CONSULTED AND AGREED WITH PLAN OF CARE: Patient   PLAN FOR NEXT SESSION: Focus on strengthening within current ROM      Paulita Fujita, OTR/L [phone]  06/03/2022, 10:32 AM

## 2022-06-08 ENCOUNTER — Other Ambulatory Visit: Payer: Self-pay | Admitting: *Deleted

## 2022-06-08 NOTE — Patient Outreach (Signed)
  Care Coordination   06/08/2022  Name: Cynthia Kelley MRN: [id number] DOB: January 03, 1956   Care Coordination Outreach Attempts:  A second unsuccessful outreach was attempted today to offer the patient with information about available care coordination services as a benefit of their health plan.   HIPAA compliant message left on voicemail, providing contact information for CSW, encouraging patient to return CSW's call at her earliest convenience.  Follow Up Plan:  Additional outreach attempts will be made to offer the patient care coordination information and services.   Encounter Outcome:  No Answer.   Care Coordination Interventions Activated:  No.    Care Coordination Interventions:  No, not indicated.    Nat Christen, BSW, MSW, LCSW  Licensed Education officer, [facility]  [place] [place] [address] [place], [place] [address] Physical Address-[address] [place], [place] [address] Toll Free Main # [phone] Fax # [phone] Cell # [phone] [email or web address]

## 2022-06-09 ENCOUNTER — Ambulatory Visit (HOSPITAL_COMMUNITY): Payer: Medicare Other | Admitting: Occupational Therapy

## 2022-06-09 ENCOUNTER — Encounter (HOSPITAL_COMMUNITY): Payer: Self-pay | Admitting: Occupational Therapy

## 2022-06-09 DIAGNOSIS — M25511 Pain in right shoulder: Secondary | ICD-10-CM

## 2022-06-09 DIAGNOSIS — M25611 Stiffness of right shoulder, not elsewhere classified: Secondary | ICD-10-CM

## 2022-06-09 DIAGNOSIS — R29898 Other symptoms and signs involving the musculoskeletal system: Secondary | ICD-10-CM | POA: Diagnosis not present

## 2022-06-09 NOTE — Therapy (Signed)
OUTPATIENT OCCUPATIONAL THERAPY TREATMENT NOTE   Patient Name: Nathan Stallworth MRN: [id number] DOB:November 16, 1955, 66 y.o., female Today's Date: 06/09/2022  PCP: Ihor Dow, MD REFERRING PROVIDER: Meredith Pel, MD   OT End of Session - 06/09/22 1111     Visit Number 7    Number of Visits 12    Date for OT Re-Evaluation 06/18/22    Authorization Type UHC Medicare    Authorization Time Period No visit limit, no auth    Progress Note Due on Visit 10    OT Start Time 0950    OT Stop Time 1030    OT Time Calculation (min) 40 min    Activity Tolerance Patient tolerated treatment well    Behavior During Therapy [facility] for tasks assessed/performed                Past Medical History:  Diagnosis Date   Acute diastolic congestive heart failure, NYHA class 2 ([place]) 11/11/2020   Anemia 11/22/2017   Anxiety 11/19/2020   Arthritis    Asymptomatic PVD (peripheral vascular disease) ([place]) 12/14/2016   Suspected.  Hands hyperemic with decreased cap refill. ? History vasospasm   COPD with acute exacerbation ([place]) 01/14/2021   FRACTURE, TIBIAL PLATEAU 05/11/2010   Qualifier: Diagnosis of  By: Aline Brochure MD, [place]     Lung cancer, primary, with metastasis from lung to other site [facility]) 01/16/2018   Metastatic to the liver   Nuclear sclerotic cataract of both eyes 06/08/2018   Osteopenia 05/15/2013   Osteoporosis    osteopenia   Tobacco abuse 12/14/2016   Past Surgical History:  Procedure Laterality Date   FRACTURE SURGERY     torn ACL   KNEE ARTHROSCOPY     Patient Active Problem List   Diagnosis Date Noted   Elevated liver enzymes    Chronic alcohol abuse    Long term current use of high dose acetaminophen    Humeral surgical neck fracture 02/27/2022   Fall at home, initial encounter 02/27/2022   Transaminitis 02/27/2022   Dementia without behavioral disturbance ([place]) 02/27/2022   Hypomagnesemia 02/27/2022   Paroxysmal atrial fibrillation ([place]) 02/27/2022   Hypokalemia  03/03/2021   Leukocytosis 03/03/2021   Anemia 03/03/2021   Thrombocytopenia ([place]) 03/03/2021   Prolonged QT interval 03/03/2021   Dehydration 03/03/2021   COPD with acute exacerbation ([place]) 01/14/2021   Acute on chronic diastolic CHF (congestive heart failure) ([place]) 01/14/2021   Acute respiratory failure with hypoxia ([place]) 01/12/2021   Community acquired pneumonia 01/12/2021   Hyponatremia 01/12/2021   Alcohol dependence ([place]) 01/12/2021   Encounter to establish care 11/19/2020   Anxiety 11/19/2020   Chronic diastolic CHF (congestive heart failure) ([place]) 11/11/2020   Typical atrial flutter ([place]) 11/11/2020   Oropharyngeal dysphagia 08/13/2020   Screening for colorectal cancer 07/19/2018   Dry eye syndrome of both eyes 06/08/2018   Nuclear sclerotic cataract of both eyes 06/08/2018   Posterior vitreous detachment of both eyes 06/08/2018   Lung cancer, primary, with metastasis from lung to other site [facility]) 01/16/2018   Osseous metastasis 11/10/2017   Metastatic squamous cell carcinoma 10/20/2017   Tobacco abuse 12/14/2016   Osteopenia 05/15/2013   KNEE, ARTHRITIS, DEGEN./OSTEO 05/11/2010    ONSET DATE:  02/27/22  REFERRING DIAG:  B51.025E (ICD-10-CM) - Other closed displaced fracture of proximal end of right humerus, initial encounter  THERAPY DIAG:  Acute pain of right shoulder  Stiffness of right shoulder, not elsewhere classified  Other symptoms and signs involving the musculoskeletal system  Rationale for Evaluation and Treatment Rehabilitation  PERTINENT HISTORY: Pt reports falling on tile on 02/27/22 resulting in a severe right closed proximal humerus fracture. It was determined that she was not a candidate for surgery due multiple medical comorbidities. She was instructed to wear a sling and discontinued use on 03/24/22 per MD. Pt was referred to OT by Dr. Meredith Pel and instructed to return for a follow up apt following therapy (around 06/16/22).   PRECAUTIONS:  none  SUBJECTIVE: S: "It's fine, I just don't move it enough at home."  PAIN:  Are you having pain? No     OBJECTIVE:  HAND DOMINANCE: Right   ADLs: Overall ADLs: Pt reports requiring assistance with dressing. Her husband is currently doing the cooking and chores around the house as pt is unable to complete due to pain and decreased ROM.    FUNCTIONAL OUTCOME MEASURES: FOTO: 54.71   UPPER EXTREMITY ROM      Active ROM Right eval Right  9/7  Shoulder flexion 41 59  Shoulder abduction 43 58  Shoulder internal rotation 90 90  Shoulder external rotation 0 8  (Blank rows = not tested)   Passive ROM Right eval Right  9/7  Shoulder flexion 92 85  Shoulder abduction 53 70  Shoulder internal rotation 90 90  Shoulder external rotation 8 8  (Blank rows = not tested)   UPPER EXTREMITY MMT:      MMT Right eval Right  9/7  Shoulder flexion 3-/5 3-/5  Shoulder abduction 3-/5 3-/5  Shoulder internal rotation 4/5 4/5  Shoulder external rotation 3/5 3/5  (Blank rows = not tested)   HAND FUNCTION: Grip strength: Right: 15 lbs; Left: 29 lbs  Right: 25 lbs       COGNITION: Overall cognitive status: Impaired Required directions to be repeated several times. Some difficulty noted with recall.     GOALS: Goals reviewed with patient? Yes     LONG TERM GOALS: Target date: 06/18/22   Pt will be provided with and educated on HEP to improve mobility in RUE required for ADL completion   Goal status: IN PROGRESS    2.  Pt will decrease pain in RUE to 1/10 or less with movement in order to complete grooming/hygiene tasks without significant pain   Goal status: IN PROGRESS    3.  Pt will decrease RUE fascial restrictions to minimal amounts or less to improve mobility required for shoulder height reaching tasks such as putting away groceries.   Goal status: IN PROGRESS    4.  Pt will increase P/ROM in RUE to [facility] to improve ability to perform dressing tasks with minimal  compensatory techniques.    Goal status: IN PROGRESS    5.  Pt will increase A/ROM of RUE to [facility] to improve ability to reach shoulder height to retrieve clothing items from closet    Goal status: IN PROGRESS    6.  Pt will increase strength in RUE to 4/5 to improve ability to perform light lifting tasks required for housework such as putting dishes away.    Goal status: IN PROGRESS   TODAY'S TREATMENT:   06/09/22 -Manual Therapy: Myofascial release, soft tissue mobilization, and trigger point completed to upper trapezius, pectoralis and biceps/triceps to decrease fascial restrictions and pain and allow for increased ROM -P/ROM: supine, flexion, abduction, horizontal abduction, er/IR, 1x5 with prolonged stretches at end range -A/ROM: supine, protraction, flexion, horizontal abduction, 10 reps each -Functional Reaching: standing at [employer], pt  reaching for pegs in abduction then placing into pegboard in flexion at approximately 60% range. Then removed in flexion.  -Overhead lacing: seated, lacing from bottom 1/4 of chain down then reversing -Pulleys: 1' flexion, 1' abduction -UBE: level 1, 3' forward, 3' reverse   06/03/22 -Manual Therapy: Myofascial release, soft tissue mobilization, and trigger point completed to upper trapezius, pectoralis and biceps/triceps to decrease fascial restrictions and pain and allow for increased ROM -P/ROM: supine, flexion, abduction, horizontal abduction, er/IR, 1x5 with prolonged stretches at end range -Functional reach: 3x pt retrieving items from bottom cabinet, placing on counter, returning to bottom cabinet   -UBE: 3 mins forward, level 1 -Functional Reach: [place] ring tree, green branch x8, blue branch x8   05/26/22 -Manual Therapy: Myofascial release, soft tissue mobilization, and trigger point completed to upper trapezius, pectoralis and biceps/triceps to decrease fascial restrictions and pain and allow for increased ROM -P/ROM: supine, flexion,  abduction, horizontal abduction, er/IR, 1x10 with prolonged stretches at end range -AA/ROM: supine, flexion, abduction, protraction, er/IR, 1x10, needs rest breaks each exercise after 5 reps -[place]: flexion and abduction, 1x10, mod verbal cuing for proper body mechanics, max tactile cuing for reducing shoulder hiking.  -ball stretch: flexion and abduction, 1x10     PATIENT EDUCATION: Education details: discussed incorporating RUE into ADLs Person educated: Patient and Spouse Education method: Explanation, Demonstration, and Handouts Education comprehension: verbalized understanding and returned demonstration   [place]: AA/ROM 8/22: Pendulums 8/30: reviewed AA/ROM with pt and husband encouraging daily movement  ASSESSMENT:   CLINICAL IMPRESSION: A: Pt reports she is not using her arm very much at home, does realize she needs to use it more. Continued with myofascial release and passive stretching. Pt achieving ROM approximately 75% during passive stretching. A/ROM completed in supine, functional reaching in standing reaching approximately 65% ROM. Added overhead lacing, pulleys, and continued with UBE. Rest breaks provided throughout the session. Pt requiring consistent visual and verbal cuing for correct form and technique.    PLAN:  OT FREQUENCY: 2x/week   OT DURATION: 6 weeks   PLANNED INTERVENTIONS: self care/ADL training, therapeutic exercise, therapeutic activity, manual therapy, passive range of motion, moist heat, cryotherapy, patient/family education, energy conservation, and DME and/or AE instructions     CONSULTED AND AGREED WITH PLAN OF CARE: Patient   PLAN FOR NEXT SESSION: Focus on strengthening within current ROM      Guadelupe Sabin, OTR/L  [phone]  06/09/2022, 11:12 AM

## 2022-06-11 ENCOUNTER — Ambulatory Visit (HOSPITAL_COMMUNITY): Payer: Medicare Other | Admitting: Occupational Therapy

## 2022-06-15 ENCOUNTER — Ambulatory Visit: Payer: Self-pay | Admitting: *Deleted

## 2022-06-15 ENCOUNTER — Encounter: Payer: Self-pay | Admitting: *Deleted

## 2022-06-15 NOTE — Patient Instructions (Signed)
Visit Information  Thank you for taking time to visit with me today. Please don't hesitate to contact me if I can be of assistance to you.   Please call the care guide team at [phone] if you need to cancel or reschedule your appointment.   If you are experiencing a Mental Health or Behavioral Health Crisis or need someone to talk to, please call the Suicide and Crisis Lifeline: 988 call the [place] National Suicide Prevention Lifeline: 1-800-273-8255 or TTY: 1-800-799-4 TTY (1-800-799-4889) to talk to a trained counselor call 1-800-273-TALK (toll free, 24 hour hotline) go to [facility] [address] [place] ([phone]) call the [place] Crisis Line: 800-939-9988 call 911  Patient verbalizes understanding of instructions and care plan provided today and agrees to view in MyChart. Active MyChart status and patient understanding of how to access instructions and care plan via MyChart confirmed with patient.     No further follow up required.  Ashleigh Luckow, BSW, MSW, LCSW  Licensed Clinical Social Worker  [facility] Care Management  System  Mailing Address-[address], [place], [place] [address] Physical Address-[address], [place],  [address] Toll Free Main # [phone] Fax # [phone] Cell # [phone] [email or web address]            

## 2022-06-15 NOTE — Patient Outreach (Signed)
  Care Coordination   Initial Visit Note   06/15/2022  Name: Jerzey Komperda MRN: [id number] DOB: [birth year]  Valene Villa is a 66 y.o. year old female who sees Lindell Spar, MD for primary care. I spoke with husband, Zora Glendenning by phone today.  What matters to the patients health and wellness today?  No Interventions Identified.    SDOH assessments and interventions completed:  Yes.  SDOH Interventions Today    Flowsheet Row Most Recent Value  SDOH Interventions   Food Insecurity Interventions Intervention Not Indicated, Other (Comment)  [Verified by Husband - [place] Interventions Intervention Not Indicated, Other (Comment)  [Verified by Husband [name] Mcgeehan]  Transportation Interventions Intervention Not Indicated, Other (Comment)  [Verified by Husband [name] [place]z]  Utilities Interventions Intervention Not Indicated, Other (Comment)  [Verified by Husband [name] [place]]  Alcohol Usage Interventions Intervention Not Indicated (Score <7), Other (Comment)  [Verified by Husband [name] Stangelo]  Financial Strain Interventions Intervention Not Indicated, Other (Comment)  [Verified by Husband [name] [place]s]  Physical Activity Interventions Patient Refused, Other (Comments)  [Verified by Husband [name] [place]]  Stress Interventions Intervention Not Indicated, Offered [employer], Provide Counseling, Other (Comment)  [Verified by Husband [name] G[place]]  Social Connections Interventions Intervention Not Indicated, Other (Comment)  [Verified by Husband - [place] Coordination Interventions Activated:  Yes.   Care Coordination Interventions:  Yes, provided.   Follow up plan: No further intervention required.   Encounter Outcome:  Pt. Visit Completed.   Nat Christen, BSW, MSW, LCSW  Licensed Education officer, [facility]  Mailing [place] [address] [place], [place]  [address] Physical Address-[address] [place], [place] [address] Toll Free Main # [phone] Fax # [phone] Cell # [phone] [email or web address]

## 2022-06-16 ENCOUNTER — Encounter (HOSPITAL_COMMUNITY): Payer: Self-pay | Admitting: Occupational Therapy

## 2022-06-16 ENCOUNTER — Ambulatory Visit (HOSPITAL_COMMUNITY): Payer: Medicare Other | Admitting: Occupational Therapy

## 2022-06-16 DIAGNOSIS — M25611 Stiffness of right shoulder, not elsewhere classified: Secondary | ICD-10-CM | POA: Diagnosis not present

## 2022-06-16 DIAGNOSIS — R29898 Other symptoms and signs involving the musculoskeletal system: Secondary | ICD-10-CM | POA: Diagnosis not present

## 2022-06-16 DIAGNOSIS — M25511 Pain in right shoulder: Secondary | ICD-10-CM

## 2022-06-16 NOTE — Therapy (Signed)
OUTPATIENT OCCUPATIONAL THERAPY TREATMENT NOTE   Patient Name: Cynthia Kelley MRN: [id number] DOB:1956/09/06, 66 y.o., female Today's Date: 06/16/2022  PCP: Ihor Dow, MD REFERRING PROVIDER: Meredith Pel, MD   OT End of Session - 06/16/22 1118     Visit Number 8    Number of Visits 12    Date for OT Re-Evaluation 06/18/22    Authorization Type UHC Medicare    Authorization Time Period No visit limit, no auth    Progress Note Due on Visit 10    OT Start Time 1118    OT Stop Time 1157    OT Time Calculation (min) 39 min    Activity Tolerance Patient tolerated treatment well    Behavior During Therapy [facility] for tasks assessed/performed                Past Medical History:  Diagnosis Date   Acute diastolic congestive heart failure, NYHA class 2 ([place]) 11/11/2020   Anemia 11/22/2017   Anxiety 11/19/2020   Arthritis    Asymptomatic PVD (peripheral vascular disease) ([place]) 12/14/2016   Suspected.  Hands hyperemic with decreased cap refill. ? History vasospasm   COPD with acute exacerbation ([place]) 01/14/2021   FRACTURE, TIBIAL PLATEAU 05/11/2010   Qualifier: Diagnosis of  By: Aline Brochure MD, [place]     Lung cancer, primary, with metastasis from lung to other site [facility]) 01/16/2018   Metastatic to the liver   Nuclear sclerotic cataract of both eyes 06/08/2018   Osteopenia 05/15/2013   Osteoporosis    osteopenia   Tobacco abuse 12/14/2016   Past Surgical History:  Procedure Laterality Date   FRACTURE SURGERY     torn ACL   KNEE ARTHROSCOPY     Patient Active Problem List   Diagnosis Date Noted   Elevated liver enzymes    Chronic alcohol abuse    Long term current use of high dose acetaminophen    Humeral surgical neck fracture 02/27/2022   Fall at home, initial encounter 02/27/2022   Transaminitis 02/27/2022   Dementia without behavioral disturbance ([place]) 02/27/2022   Hypomagnesemia 02/27/2022   Paroxysmal atrial fibrillation ([place]) 02/27/2022   Hypokalemia  03/03/2021   Leukocytosis 03/03/2021   Anemia 03/03/2021   Thrombocytopenia ([place]) 03/03/2021   Prolonged QT interval 03/03/2021   Dehydration 03/03/2021   COPD with acute exacerbation ([place]) 01/14/2021   Acute on chronic diastolic CHF (congestive heart failure) ([place]) 01/14/2021   Acute respiratory failure with hypoxia ([place]) 01/12/2021   Community acquired pneumonia 01/12/2021   Hyponatremia 01/12/2021   Alcohol dependence ([place]) 01/12/2021   Encounter to establish care 11/19/2020   Anxiety 11/19/2020   Chronic diastolic CHF (congestive heart failure) ([place]) 11/11/2020   Typical atrial flutter ([place]) 11/11/2020   Oropharyngeal dysphagia 08/13/2020   Screening for colorectal cancer 07/19/2018   Dry eye syndrome of both eyes 06/08/2018   Nuclear sclerotic cataract of both eyes 06/08/2018   Posterior vitreous detachment of both eyes 06/08/2018   Lung cancer, primary, with metastasis from lung to other site [facility]) 01/16/2018   Osseous metastasis 11/10/2017   Metastatic squamous cell carcinoma 10/20/2017   Tobacco abuse 12/14/2016   Osteopenia 05/15/2013   KNEE, ARTHRITIS, DEGEN./OSTEO 05/11/2010    ONSET DATE:  02/27/22  REFERRING DIAG:  W38.937D (ICD-10-CM) - Other closed displaced fracture of proximal end of right humerus, initial encounter  THERAPY DIAG:  Acute pain of right shoulder  Stiffness of right shoulder, not elsewhere classified  Other symptoms and signs involving the musculoskeletal system  Rationale for Evaluation and Treatment Rehabilitation  PERTINENT HISTORY: Pt reports falling on tile on 02/27/22 resulting in a severe right closed proximal humerus fracture. It was determined that she was not a candidate for surgery due multiple medical comorbidities. She was instructed to wear a sling and discontinued use on 03/24/22 per MD. Pt was referred to OT by Dr. Meredith Pel and instructed to return for a follow up apt following therapy (around 06/16/22).   PRECAUTIONS:  none  SUBJECTIVE: S: "I don't try to use it."   PAIN:  Are you having pain? No     OBJECTIVE:  HAND DOMINANCE: Right   ADLs: Overall ADLs: Pt reports requiring assistance with dressing. Her husband is currently doing the cooking and chores around the house as pt is unable to complete due to pain and decreased ROM.    FUNCTIONAL OUTCOME MEASURES: FOTO: 54.71   UPPER EXTREMITY ROM      Active ROM Right eval Right  9/7  Shoulder flexion 41 59  Shoulder abduction 43 58  Shoulder internal rotation 90 90  Shoulder external rotation 0 8  (Blank rows = not tested)   Passive ROM Right eval Right  9/7  Shoulder flexion 92 85  Shoulder abduction 53 70  Shoulder internal rotation 90 90  Shoulder external rotation 8 8  (Blank rows = not tested)   UPPER EXTREMITY MMT:      MMT Right eval Right  9/7  Shoulder flexion 3-/5 3-/5  Shoulder abduction 3-/5 3-/5  Shoulder internal rotation 4/5 4/5  Shoulder external rotation 3/5 3/5  (Blank rows = not tested)   HAND FUNCTION: Grip strength: Right: 15 lbs; Left: 29 lbs  Right: 25 lbs       COGNITION: Overall cognitive status: Impaired Required directions to be repeated several times. Some difficulty noted with recall.     GOALS: Goals reviewed with patient? Yes     LONG TERM GOALS: Target date: 06/18/22   Pt will be provided with and educated on HEP to improve mobility in RUE required for ADL completion   Goal status: IN PROGRESS    2.  Pt will decrease pain in RUE to 1/10 or less with movement in order to complete grooming/hygiene tasks without significant pain   Goal status: IN PROGRESS    3.  Pt will decrease RUE fascial restrictions to minimal amounts or less to improve mobility required for shoulder height reaching tasks such as putting away groceries.   Goal status: IN PROGRESS    4.  Pt will increase P/ROM in RUE to [facility] to improve ability to perform dressing tasks with minimal compensatory techniques.     Goal status: IN PROGRESS    5.  Pt will increase A/ROM of RUE to [facility] to improve ability to reach shoulder height to retrieve clothing items from closet    Goal status: IN PROGRESS    6.  Pt will increase strength in RUE to 4/5 to improve ability to perform light lifting tasks required for housework such as putting dishes away.    Goal status: IN PROGRESS   TODAY'S TREATMENT:   06/16/22 -Manual Therapy: Myofascial release, soft tissue mobilization, and trigger point completed to upper trapezius, pectoralis and biceps/triceps to decrease fascial restrictions and pain and allow for increased ROM -P/ROM: supine, flexion, abduction, horizontal abduction, er/IR, 5 reps each -A/ROM: sitting, protraction, flexion, horizontal abduction, er/IR, abduction, 10 reps each -Functional Reaching: sitting at table, working on placing red and yellow clothespins  along pinch tree, able to place 11 pins then remove.  -Overhead lacing: seated, lacing from bottom 1/4 of chain down then reversing -UBE: level 1, 2' forward, 2' reverse; pace: 2.5   06/09/22 -Manual Therapy: Myofascial release, soft tissue mobilization, and trigger point completed to upper trapezius, pectoralis and biceps/triceps to decrease fascial restrictions and pain and allow for increased ROM -P/ROM: supine, flexion, abduction, horizontal abduction, er/IR, 1x5 with prolonged stretches at end range -A/ROM: supine, protraction, flexion, horizontal abduction, 10 reps each -Functional Reaching: standing at tall mirror, pt reaching for pegs in abduction then placing into pegboard in flexion at approximately 60% range. Then removed in flexion.  -Overhead lacing: seated, lacing from bottom 1/4 of chain down then reversing -Pulleys: 1' flexion, 1' abduction -UBE: level 1, 3' forward, 3' reverse   06/03/22 -Manual Therapy: Myofascial release, soft tissue mobilization, and trigger point completed to upper trapezius, pectoralis and biceps/triceps to  decrease fascial restrictions and pain and allow for increased ROM -P/ROM: supine, flexion, abduction, horizontal abduction, er/IR, 1x5 with prolonged stretches at end range -Functional reach: 3x pt retrieving items from bottom cabinet, placing on counter, returning to bottom cabinet   -UBE: 3 mins forward, level 1 -Functional Reach: [place] ring tree, green branch x8, blue branch x8    PATIENT EDUCATION: Education details: discussed incorporating RUE into ADLs Person educated: Patient and Spouse Education method: Explanation, Demonstration, and Handouts Education comprehension: verbalized understanding and returned demonstration   [place]: AA/ROM 8/22: Pendulums 8/30: reviewed AA/ROM with pt and husband encouraging daily movement  ASSESSMENT:   CLINICAL IMPRESSION: A: Pt reports she is not using her arm very much at home, does not really know why. Continued with A/ROM and functional reaching today, cuing for posture and to limit leaning to the left during reaching. Verbal and visual cuing for correct form and completion during exercises. Discussed importance of incorporating her RUE into her daily tasks. Did complete passive stretching today, pt tolerating approximately 75% ROM for flexion, 50-60% for abduction.    PLAN:  OT FREQUENCY: 2x/week   OT DURATION: 6 weeks   PLANNED INTERVENTIONS: self care/ADL training, therapeutic exercise, therapeutic activity, manual therapy, passive range of motion, moist heat, cryotherapy, patient/family education, energy conservation, and DME and/or AE instructions     CONSULTED AND AGREED WITH PLAN OF CARE: Patient   PLAN FOR NEXT SESSION: Focus on strengthening within current ROM      Guadelupe Sabin, OTR/L  [phone]  06/16/2022, 11:58 AM

## 2022-06-18 ENCOUNTER — Encounter (HOSPITAL_COMMUNITY): Payer: Self-pay | Admitting: Occupational Therapy

## 2022-06-18 ENCOUNTER — Ambulatory Visit (HOSPITAL_COMMUNITY): Payer: Medicare Other | Admitting: Occupational Therapy

## 2022-06-18 DIAGNOSIS — M25611 Stiffness of right shoulder, not elsewhere classified: Secondary | ICD-10-CM

## 2022-06-18 DIAGNOSIS — R29898 Other symptoms and signs involving the musculoskeletal system: Secondary | ICD-10-CM

## 2022-06-18 DIAGNOSIS — M25511 Pain in right shoulder: Secondary | ICD-10-CM

## 2022-06-18 NOTE — Therapy (Signed)
OUTPATIENT OCCUPATIONAL THERAPY TREATMENT NOTE REASSESSMENT AND DISCHARGE   Patient Name: Cynthia Kelley MRN: [id number] DOB:03-15-1956, 66 y.o., female Today's Date: 06/18/2022  PCP: Ihor Dow, MD REFERRING PROVIDER: Meredith Pel, MD   OT End of Session - 06/18/22 1024     Visit Number 9    Number of Visits 12    Date for OT Re-Evaluation 06/18/22    Authorization Type [employer] Medicare    Authorization Time Period No visit limit, no auth    Progress Note Due on Visit 10    OT Start Time [phone]    OT Stop Time 1020    OT Time Calculation (min) 34 min    Activity Tolerance Patient tolerated treatment well    Behavior During Therapy [facility] for tasks assessed/performed                 Past Medical History:  Diagnosis Date   Acute diastolic congestive heart failure, NYHA class 2 ([place]) 11/11/2020   Anemia 11/22/2017   Anxiety 11/19/2020   Arthritis    Asymptomatic PVD (peripheral vascular disease) ([place]) 12/14/2016   Suspected.  Hands hyperemic with decreased cap refill. ? History vasospasm   COPD with acute exacerbation ([place]) 01/14/2021   FRACTURE, TIBIAL PLATEAU 05/11/2010   Qualifier: Diagnosis of  By: Aline Brochure MD, [place]     Lung cancer, primary, with metastasis from lung to other site [facility]) 01/16/2018   Metastatic to the liver   Nuclear sclerotic cataract of both eyes 06/08/2018   Osteopenia 05/15/2013   Osteoporosis    osteopenia   Tobacco abuse 12/14/2016   Past Surgical History:  Procedure Laterality Date   FRACTURE SURGERY     torn ACL   KNEE ARTHROSCOPY     Patient Active Problem List   Diagnosis Date Noted   Elevated liver enzymes    Chronic alcohol abuse    Long term current use of high dose acetaminophen    Humeral surgical neck fracture 02/27/2022   Fall at home, initial encounter 02/27/2022   Transaminitis 02/27/2022   Dementia without behavioral disturbance ([place]) 02/27/2022   Hypomagnesemia 02/27/2022   Paroxysmal atrial fibrillation  ([place]) 02/27/2022   Hypokalemia 03/03/2021   Leukocytosis 03/03/2021   Anemia 03/03/2021   Thrombocytopenia ([place]) 03/03/2021   Prolonged QT interval 03/03/2021   Dehydration 03/03/2021   COPD with acute exacerbation ([place]) 01/14/2021   Acute on chronic diastolic CHF (congestive heart failure) ([place]) 01/14/2021   Acute respiratory failure with hypoxia ([place]) 01/12/2021   Community acquired pneumonia 01/12/2021   Hyponatremia 01/12/2021   Alcohol dependence ([place]) 01/12/2021   Encounter to establish care 11/19/2020   Anxiety 11/19/2020   Chronic diastolic CHF (congestive heart failure) ([place]) 11/11/2020   Typical atrial flutter ([place]) 11/11/2020   Oropharyngeal dysphagia 08/13/2020   Screening for colorectal cancer 07/19/2018   Dry eye syndrome of both eyes 06/08/2018   Nuclear sclerotic cataract of both eyes 06/08/2018   Posterior vitreous detachment of both eyes 06/08/2018   Lung cancer, primary, with metastasis from lung to other site [facility]) 01/16/2018   Osseous metastasis 11/10/2017   Metastatic squamous cell carcinoma 10/20/2017   Tobacco abuse 12/14/2016   Osteopenia 05/15/2013   KNEE, ARTHRITIS, DEGEN./OSTEO 05/11/2010    ONSET DATE:  02/27/22  REFERRING DIAG:  J62.831D (ICD-10-CM) - Other closed displaced fracture of proximal end of right humerus, initial encounter  THERAPY DIAG:  Acute pain of right shoulder  Stiffness of right shoulder, not elsewhere classified  Other symptoms and signs involving  the musculoskeletal system  Rationale for Evaluation and Treatment Rehabilitation  PERTINENT HISTORY: Pt reports falling on tile on 02/27/22 resulting in a severe right closed proximal humerus fracture. It was determined that she was not a candidate for surgery due multiple medical comorbidities. She was instructed to wear a sling and discontinued use on 03/24/22 per MD. Pt was referred to OT by Dr. Meredith Pel and instructed to return for a follow up apt following therapy  (around 06/16/22).   PRECAUTIONS: none  SUBJECTIVE: S: "I tried to put some things away in the cabinet."   PAIN:  Are you having pain? No     OBJECTIVE:  HAND DOMINANCE: Right   ADLs: Overall ADLs: Pt reports requiring assistance with dressing. Her husband is currently doing the cooking and chores around the house as pt is unable to complete due to pain and decreased ROM.    FUNCTIONAL OUTCOME MEASURES: FOTO: 54.71/100 9/22: 63/100   UPPER EXTREMITY ROM      Assessed seated, er/IR adducted   Active ROM Right eval Right  9/7 Right 9/22  Shoulder flexion 41 59 93  Shoulder abduction 43 58 87  Shoulder internal rotation 90 90 90  Shoulder external rotation 0 8 35  (Blank rows = not tested)   Assessed supine, er/IR adducted  Passive ROM Right eval Right  9/7 Right 9/22  Shoulder flexion 92 85 110  Shoulder abduction 53 70 105  Shoulder internal rotation 90 90 90  Shoulder external rotation 8 8 36  (Blank rows = not tested)   UPPER EXTREMITY MMT:      Assessed seated, er/IR adducted   MMT Right eval Right  9/7 Right 9/22  Shoulder flexion 3-/5 3-/5 3-/5  Shoulder abduction 3-/5 3-/5 3-/5  Shoulder internal rotation 4/5 4/5 4/5  Shoulder external rotation 3/5 3/5 3/5  (Blank rows = not tested)   HAND FUNCTION: Grip strength: Right: 15 lbs; Left: 29 lbs 9/7: Right: 25 lbs   9/22: Right: 27 lbs    COGNITION: Overall cognitive status: Impaired Required directions to be repeated several times. Some difficulty noted with recall.     GOALS: Goals reviewed with patient? Yes     LONG TERM GOALS: Target date: 06/18/22   Pt will be provided with and educated on HEP to improve mobility in RUE required for ADL completion   Goal status: NOT MET   2.  Pt will decrease pain in RUE to 1/10 or less with movement in order to complete grooming/hygiene tasks without significant pain   Goal status: MET   3.  Pt will decrease RUE fascial restrictions to minimal  amounts or less to improve mobility required for shoulder height reaching tasks such as putting away groceries.   Goal status: MET   4.  Pt will increase P/ROM in RUE to [facility] to improve ability to perform dressing tasks with minimal compensatory techniques.    Goal status: NOT MET   5.  Pt will increase A/ROM of RUE to [facility] to improve ability to reach shoulder height to retrieve clothing items from closet    Goal status: NOT MET   6.  Pt will increase strength in RUE to 4/5 to improve ability to perform light lifting tasks required for housework such as putting dishes away.    Goal status: PARTIALLY MET  TODAY'S TREATMENT:   06/18/22 -P/ROM: supine, flexion, abduction, horizontal abduction, er/IR, 5 reps each -A/ROM: sitting, protraction, flexion, horizontal abduction, er/IR, abduction, 10 reps each -  Functional Reaching: standing at kitchen cabinet, reaching to bottom shelf and removing items, then replacing. Completed in flexion.  -UBE: level 1, 2' forward, 2' reverse; pace: 3.0   06/16/22 -Manual Therapy: Myofascial release, soft tissue mobilization, and trigger point completed to upper trapezius, pectoralis and biceps/triceps to decrease fascial restrictions and pain and allow for increased ROM -P/ROM: supine, flexion, abduction, horizontal abduction, er/IR, 5 reps each -A/ROM: sitting, protraction, flexion, horizontal abduction, er/IR, abduction, 10 reps each -Functional Reaching: sitting at table, working on placing red and yellow clothespins along pinch tree, able to place 11 pins then remove.  -Overhead lacing: seated, lacing from bottom 1/4 of chain down then reversing -UBE: level 1, 2' forward, 2' reverse; pace: 2.5   06/09/22 -Manual Therapy: Myofascial release, soft tissue mobilization, and trigger point completed to upper trapezius, pectoralis and biceps/triceps to decrease fascial restrictions and pain and allow for increased ROM -P/ROM: supine, flexion, abduction,  horizontal abduction, er/IR, 1x5 with prolonged stretches at end range -A/ROM: supine, protraction, flexion, horizontal abduction, 10 reps each -Functional Reaching: standing at tall mirror, pt reaching for pegs in abduction then placing into pegboard in flexion at approximately 60% range. Then removed in flexion.  -Overhead lacing: seated, lacing from bottom 1/4 of chain down then reversing -Pulleys: 1' flexion, 1' abduction -UBE: level 1, 3' forward, 3' reverse   PATIENT EDUCATION: Education details: A/ROM, discussed incorporating RUE into ADLs Person educated: Patient and Spouse Education method: Explanation, Demonstration, and Handouts Education comprehension: verbalized understanding and returned demonstration   [place]: AA/ROM 8/22: Pendulums 8/30: reviewed AA/ROM with pt and husband encouraging daily movement 9/22: A/ROM  ASSESSMENT:   CLINICAL IMPRESSION: A: Reassessment completed this session, pt has met 2/6 goals and has partially met an additional goal. Pt has made improvements in ROM, now has approximately 50% range throughout all planes. Pt reports she did try to use the RUE this week to reach into cabinets, does not have pain most of the time but does have soreness when she uses the right arm. Continued with A/ROM and updated HEP. Encouraged pt to continue incorporating RUE into daily tasks and to practice reaching at home. Pt would like to discharge today and work on HEP at home.    PLAN:  OT FREQUENCY: 2x/week   OT DURATION: 6 weeks   PLANNED INTERVENTIONS: self care/ADL training, therapeutic exercise, therapeutic activity, manual therapy, passive range of motion, moist heat, cryotherapy, patient/family education, energy conservation, and DME and/or AE instructions     CONSULTED AND AGREED WITH PLAN OF CARE: Patient   PLAN FOR NEXT SESSION: Discharge pt      Guadelupe Sabin, OTR/L  [phone]  06/18/2022, 10:24 AM     OCCUPATIONAL  THERAPY DISCHARGE SUMMARY  Visits from Start of Care: 9  Current functional level related to goals / functional outcomes: See above. Pt has met 2 goals and partially met an additional goal. ROM is approximately 50%, pt reports she is trying to incorporate into her ADLs.    Remaining deficits: ROM and strength deficits   Education / Equipment: HEP   Patient agrees to discharge. Patient goals were partially met. Patient is being discharged due to being pleased with the current functional level.Marland Kitchen

## 2022-06-18 NOTE — Patient Instructions (Signed)

## 2022-06-21 ENCOUNTER — Encounter (HOSPITAL_COMMUNITY): Payer: Medicare Other | Admitting: Occupational Therapy

## 2022-06-23 ENCOUNTER — Encounter (HOSPITAL_COMMUNITY): Payer: Medicare Other | Admitting: Occupational Therapy

## 2022-08-10 DIAGNOSIS — I5032 Chronic diastolic (congestive) heart failure: Secondary | ICD-10-CM | POA: Diagnosis not present

## 2022-08-10 DIAGNOSIS — I4891 Unspecified atrial fibrillation: Secondary | ICD-10-CM | POA: Diagnosis not present

## 2022-08-10 DIAGNOSIS — R001 Bradycardia, unspecified: Secondary | ICD-10-CM | POA: Diagnosis not present

## 2022-08-10 DIAGNOSIS — I480 Paroxysmal atrial fibrillation: Secondary | ICD-10-CM | POA: Diagnosis not present

## 2022-08-27 ENCOUNTER — Ambulatory Visit (INDEPENDENT_AMBULATORY_CARE_PROVIDER_SITE_OTHER): Payer: Medicare Other

## 2022-08-27 ENCOUNTER — Ambulatory Visit
Admission: EM | Admit: 2022-08-27 | Discharge: 2022-08-27 | Disposition: A | Discharge status: Home / Self Care | Payer: Medicare Other | Attending: Family Medicine | Admitting: Family Medicine

## 2022-08-27 ENCOUNTER — Encounter: Payer: Self-pay | Admitting: Emergency Medicine

## 2022-08-27 DIAGNOSIS — S62001A Unspecified fracture of navicular [scaphoid] bone of right wrist, initial encounter for closed fracture: Secondary | ICD-10-CM | POA: Diagnosis not present

## 2022-08-27 DIAGNOSIS — S52501A Unspecified fracture of the lower end of right radius, initial encounter for closed fracture: Secondary | ICD-10-CM

## 2022-08-27 DIAGNOSIS — W19XXXA Unspecified fall, initial encounter: Secondary | ICD-10-CM

## 2022-08-27 DIAGNOSIS — M25531 Pain in right wrist: Secondary | ICD-10-CM | POA: Diagnosis not present

## 2022-08-27 MED ORDER — HYDROCODONE-ACETAMINOPHEN 5-325 MG PO TABS: 1.0000 | ORAL_TABLET | Freq: Two times a day (BID) | ORAL | 0 refills | Status: DC | PRN

## 2022-08-27 NOTE — ED Triage Notes (Signed)
Fell on Wednesday.  Pain to right wrist.

## 2022-08-27 NOTE — Discharge Instructions (Signed)
Your x-ray today shows that you have broken your right wrist in 2 places, the distal portion of the radius as well as the scaphoid bone.  This can be a very tricky fracture and you will need close orthopedic follow-up as soon as possible.  Please call the orthopedist listed or the orthopedist of your choice as soon as you leave today and see if somebody can see you either today or Monday for further evaluation.  We have immobilized your wrist with a splint today and I have sent over a small supply of pain medication to hold you over until you can see the orthopedist.  Go to the emergency department for severely worsening symptoms, including loss of sensation in the hand, discoloration of the hand, significant numbness or tingling

## 2022-08-29 NOTE — ED Provider Notes (Signed)
[facility]    CSN: [id number] Arrival date & time: 08/27/22  1019      History   Chief Complaint No chief complaint on file.   HPI Cynthia Kelley is a 66 y.o. female.   Fell on Wednesday.  Pain to right wrist.        Past Medical History:  Diagnosis Date   Acute diastolic congestive heart failure, NYHA class 2 ([place]) 11/11/2020   Anemia 11/22/2017   Anxiety 11/19/2020   Arthritis    Asymptomatic PVD (peripheral vascular disease) ([place]) 12/14/2016   Suspected.  Hands hyperemic with decreased cap refill. ? History vasospasm   COPD with acute exacerbation ([place]) 01/14/2021   FRACTURE, TIBIAL PLATEAU 05/11/2010   Qualifier: Diagnosis of  By: Aline Brochure MD, [place]     Lung cancer, primary, with metastasis from lung to other site [facility]) 01/16/2018   Metastatic to the liver   Nuclear sclerotic cataract of both eyes 06/08/2018   Osteopenia 05/15/2013   Osteoporosis    osteopenia   Tobacco abuse 12/14/2016    Patient Active Problem List   Diagnosis Date Noted   Elevated liver enzymes    Chronic alcohol abuse    Long term current use of high dose acetaminophen    Humeral surgical neck fracture 02/27/2022   Fall at home, initial encounter 02/27/2022   Transaminitis 02/27/2022   Dementia without behavioral disturbance ([place]) 02/27/2022   Hypomagnesemia 02/27/2022   Paroxysmal atrial fibrillation ([place]) 02/27/2022   Hypokalemia 03/03/2021   Leukocytosis 03/03/2021   Anemia 03/03/2021   Thrombocytopenia ([place]) 03/03/2021   Prolonged QT interval 03/03/2021   Dehydration 03/03/2021   COPD with acute exacerbation ([place]) 01/14/2021   Acute on chronic diastolic CHF (congestive heart failure) ([place]) 01/14/2021   Acute respiratory failure with hypoxia ([place]) 01/12/2021   Community acquired pneumonia 01/12/2021   Hyponatremia 01/12/2021   Alcohol dependence ([place]) 01/12/2021   Encounter to establish care 11/19/2020   Anxiety 11/19/2020   Chronic diastolic CHF (congestive  heart failure) ([place]) 11/11/2020   Typical atrial flutter ([place]) 11/11/2020   Oropharyngeal dysphagia 08/13/2020   Screening for colorectal cancer 07/19/2018   Dry eye syndrome of both eyes 06/08/2018   Nuclear sclerotic cataract of both eyes 06/08/2018   Posterior vitreous detachment of both eyes 06/08/2018   Lung cancer, primary, with metastasis from lung to other site [facility]) 01/16/2018   Osseous metastasis 11/10/2017   Metastatic squamous cell carcinoma 10/20/2017   Tobacco abuse 12/14/2016   Osteopenia 05/15/2013   KNEE, ARTHRITIS, DEGEN./OSTEO 05/11/2010    Past Surgical History:  Procedure Laterality Date   FRACTURE SURGERY     torn ACL   KNEE ARTHROSCOPY      OB History     Gravida  1   Para  1   Term      Preterm      AB      Living  1      SAB      IAB      Ectopic      Multiple      Live Births  1            Home Medications    Prior to Admission medications   Medication Sig Start Date End Date Taking? Authorizing Provider  HYDROcodone-acetaminophen (NORCO/VICODIN) 5-325 MG tablet Take 1 tablet by mouth 2 (two) times daily as needed for moderate pain. 08/27/22  Yes Volney American, PA-C  amiodarone (PACERONE) 200 MG tablet Take 200 mg  by mouth daily. 02/06/21   [provider]  apixaban (ELIQUIS) 5 MG TABS tablet Take 5 mg by mouth 2 (two) times daily. 09/27/20   [provider]  furosemide (LASIX) 20 MG tablet Take 1 tablet (20 mg total) by mouth daily as needed for fluid. 03/04/22   Kathie Dike, MD  memantine (NAMENDA) 10 MG tablet Take 10 mg by mouth every morning. 02/05/21   [provider]  metoprolol tartrate (LOPRESSOR) 25 MG tablet Take 1 tablet (25 mg total) by mouth 2 (two) times daily. 03/04/22   Kathie Dike, MD  Multiple Vitamins-Minerals (MULTIVITAMIN WITH MINERALS) tablet Take 1 tablet by mouth daily.    [provider]  oxyCODONE (OXY IR/ROXICODONE) 5 MG immediate release tablet Take 1  tablet (5 mg total) by mouth every 12 (twelve) hours as needed for moderate pain or severe pain. 04/21/22   Meredith Pel, MD  polyethylene glycol (MIRALAX / GLYCOLAX) 17 g packet Take 17 g by mouth daily. 03/05/22   Kathie Dike, MD  potassium chloride (KLOR-CON) 10 MEQ tablet Take 10 mEq by mouth 2 (two) times daily. 10/05/21   [provider]  sodium chloride 1 g tablet Take 1 tablet (1 g total) by mouth 2 (two) times daily with a meal. Patient taking differently: Take 1 g by mouth daily. 03/06/21   Barton Dubois, MD  traZODone (DESYREL) 50 MG tablet Take 50 mg by mouth at bedtime. 02/12/22   [provider]    Family History Family History  Problem Relation Age of Onset   Cancer Father        died in 24's everywhere   Heart disease Mother    Cancer Mother        lung   Stroke Mother    Asthma Daughter     Social History Social History   Tobacco Use   Smoking status: Every Day    Packs/day: 0.50    Years: 20.00    Total pack years: 10.00    Types: Cigarettes    Start date: 09/28/1963    Passive exposure: Current   Smokeless tobacco: Never   Tobacco comments:    Smoking Cessation Offered.  Vaping Use   Vaping Use: Never used  Substance Use Topics   Alcohol use: Yes    Alcohol/week: 20.0 standard drinks of alcohol    Types: 20 Cans of beer per week    Comment: 3-4 beers a day   Drug use: No     Allergies   Patient has no known allergies.   Review of Systems Review of Systems PER HPI  Physical Exam Triage Vital Signs ED Triage Vitals  Enc Vitals Group     BP 08/27/22 1028 106/68     Pulse Rate 08/27/22 1028 72     Resp 08/27/22 1028 18     Temp 08/27/22 1028 97.7 F (36.5 C)     Temp Source 08/27/22 1028 Oral     SpO2 08/27/22 1028 93 %     Weight --      Height --      Head Circumference --      Peak Flow --      Pain Score 08/27/22 1030 8     Pain Loc --      Pain Edu? --      Excl. in [place]? --    No data found.  Updated  Vital Signs BP 106/68 (BP Location: Left Arm)   Pulse 72  Temp 97.7 F (36.5 C) (Oral)   Resp 18   SpO2 93%   Visual Acuity Right Eye Distance:   Left Eye Distance:   Bilateral Distance:    Right Eye Near:   Left Eye Near:    Bilateral Near:     Physical Exam Vitals and nursing note reviewed.  Constitutional:      Appearance: Normal appearance. She is not ill-appearing.  HENT:     Head: Atraumatic.     Mouth/Throat:     Mouth: Mucous membranes are moist.  Eyes:     Extraocular Movements: Extraocular movements intact.     Conjunctiva/sclera: Conjunctivae normal.  Cardiovascular:     Rate and Rhythm: Normal rate and regular rhythm.     Heart sounds: Normal heart sounds.  Pulmonary:     Effort: Pulmonary effort is normal.     Breath sounds: Normal breath sounds.  Musculoskeletal:        General: Swelling, tenderness, deformity and signs of injury present.     Cervical back: Normal range of motion and neck supple.     Comments: Right radial aspect of wrist significantly tender to palpation, very limited range of motion in this area and deformity appreciable.  Area is significantly edematous  Skin:    General: Skin is warm and dry.     Findings: No bruising.  Neurological:     Mental Status: She is alert and oriented to person, place, and time.     Comments: Right hand neurovascularly intact  Psychiatric:        Mood and Affect: Mood normal.        Thought Content: Thought content normal.        Judgment: Judgment normal.     UC Treatments / Results  Labs (all labs ordered are listed, but only abnormal results are displayed) Labs Reviewed - No data to display  EKG   Radiology DG Wrist Complete Right  Result Date: 08/27/2022 CLINICAL DATA:  History of fall EXAM: RIGHT WRIST - COMPLETE 3+ VIEW COMPARISON:  Report from 20/12, no prior imaging from 2012 is available. FINDINGS: Extensive disuse osteopenia. Callus formation about the distal radius which is  foreshortened/impacted with ulnar positive variance. Signs of scaphoid fracture in the mid scaphoid with sclerosis of the proximal pole. There is dorsal angulation of the distal radius. There is soft tissue swelling over the distal radius. Signs of acute fracture are noted best on the lateral projection with mild dorsal displacement of the impacted acute on chronic injury. IMPRESSION: 1. Acute on chronic fracture of the distal radius with impacted/foreshortened changes of the distal radius associated with marked soft tissue swelling. Findings also associated with mild to moderate dorsal angulation/dorsal tilt of the radial articular surface. 2. Scaphoid fracture which is completely displaced. Likely acute. May be superimposed on a chronic process as well. 3. Extensive disuse osteopenia. Electronically Signed   By: Zetta Bills M.D.   On: 08/27/2022 10:52    Procedures Procedures (including critical care time)  Medications Ordered in UC Medications - No data to display  Initial Impression / Assessment and Plan / UC Course  I have reviewed the triage vital signs and the nursing notes.  Pertinent labs & imaging results that were available during my care of the patient were reviewed by me and considered in my medical decision making (see chart for details).     Patient's x-ray today of the right wrist has revealed a displaced scaphoid fracture as well as distal  radius fracture.  Her extremity is neurovascularly intact, radial gutter extending into the volar surface applied today and orthopedic resources given.  Discussed the urgency of following up with orthopedics and they will call as soon as they leave to get the soonest appointment available for further management.  Small supply of tramadol given for pain control in the meantime.  Discussed ED precautions.  Final Clinical Impressions(s) / UC Diagnoses   Final diagnoses:  Closed displaced fracture of scaphoid of right wrist, unspecified portion  of scaphoid, initial encounter  Closed fracture of distal end of right radius, unspecified fracture morphology, initial encounter     Discharge Instructions      Your x-ray today shows that you have broken your right wrist in 2 places, the distal portion of the radius as well as the scaphoid bone.  This can be a very tricky fracture and you will need close orthopedic follow-up as soon as possible.  Please call the orthopedist listed or the orthopedist of your choice as soon as you leave today and see if somebody can see you either today or Monday for further evaluation.  We have immobilized your wrist with a splint today and I have sent over a small supply of pain medication to hold you over until you can see the orthopedist.  Go to the emergency department for severely worsening symptoms, including loss of sensation in the hand, discoloration of the hand, significant numbness or tingling     ED Prescriptions     Medication Sig Dispense Auth. Provider   HYDROcodone-acetaminophen (NORCO/VICODIN) 5-325 MG tablet Take 1 tablet by mouth 2 (two) times daily as needed for moderate pain. 10 tablet Volney American, [place]      I have reviewed the PDMP during this encounter.   Merrie Roof [place], [place] 08/29/22 [phone]

## 2022-08-31 ENCOUNTER — Ambulatory Visit (INDEPENDENT_AMBULATORY_CARE_PROVIDER_SITE_OTHER): Payer: Medicare Other | Admitting: Orthopedic Surgery

## 2022-08-31 ENCOUNTER — Ambulatory Visit (INDEPENDENT_AMBULATORY_CARE_PROVIDER_SITE_OTHER): Payer: Medicare Other

## 2022-08-31 DIAGNOSIS — S62101A Fracture of unspecified carpal bone, right wrist, initial encounter for closed fracture: Secondary | ICD-10-CM

## 2022-09-02 ENCOUNTER — Encounter: Payer: Self-pay | Admitting: Orthopedic Surgery

## 2022-09-02 NOTE — Progress Notes (Signed)
[facility]thopa[facility] Clinic Return  Assessment: Cynthia Kelley is a 66 y.o. female with the following: Right distal radius fracture   Plan: [name] sustained a right distal radius fracture.  There is some mild translation, as well as loss of volar tilt.  However, given her level of activity, he is reasonable to try to continue with nonoperative management.  I previously saw her for a right proximal humerus fracture dislocation, and had recovered, with limited function.  She also has an extensive medical history.  As such, I would like to keep her in her current splint.  I will see her back in 1 week to ensure that the fracture has not displaced further.  Should the fracture displace further, we may consider closed reduction with pinning of the fracture, to improve the overall alignment.  Medication as needed.  Keep the cast clean and dry.  Follow-up in 1 week.  Follow-up: Return in about 1 week (around 09/07/2022).   Subjective:  Chief Complaint  Patient presents with   Follow-up    Recheck on Right wrist    History of Present Illness: Cynthia Kelley is a 66 y.o. female who returns to clinic for evaluation of right wrist pain.  A few days ago, she fell, and landed on outstretched right arm.  She had immediate pain.  She presented to the department.  Radiographs demonstrated distal radius fracture.  She was splinted and referred to clinic today.  I have seen her before, as a consult in the hospital.  She sustained a displaced proximal humerus fracture dislocation, and her medical history prevented further consideration for surgery.  She has been followed by Dr. Marlou Sa for this injury.  Review of Systems: No fevers or chills No numbness or tingling No chest pain No shortness of breath No bowel or bladder dysfunction No GI distress No headaches   Objective: There were no vitals taken for this visit.  Physical Exam:  Alert.  She demonstrates some confusion, which is her baseline.  Splint on the  right arm is clean, dry and intact.  This encompasses the index and long finger.  She is comfortable in the splint.  Fingers are warm and well-perfused.  She is able to wiggle her exposed fingers.  IMAGING: I personally ordered and reviewed the following images:  X-rays of the right wrist were obtained in her current splint.  There is a fracture of the distal radius.  No obvious intra-articular involvement.  There is loss of volar tilt, with mild dorsal angulation of the fracture.  Impression right distal radius fracture in reasonable alignment.  Mordecai Rasmussen, MD 09/02/2022 10:28 AM

## 2022-09-04 ENCOUNTER — Emergency Department (HOSPITAL_COMMUNITY)
Admission: EM | Admit: 2022-09-04 | Discharge: 2022-09-05 | Disposition: A | Discharge status: Home / Self Care | Payer: Medicare Other | Attending: Emergency Medicine | Admitting: Emergency Medicine

## 2022-09-04 ENCOUNTER — Emergency Department (HOSPITAL_COMMUNITY): Payer: Medicare Other

## 2022-09-04 ENCOUNTER — Encounter (HOSPITAL_COMMUNITY): Payer: Self-pay | Admitting: *Deleted

## 2022-09-04 ENCOUNTER — Other Ambulatory Visit: Payer: Self-pay

## 2022-09-04 DIAGNOSIS — Z7901 Long term (current) use of anticoagulants: Secondary | ICD-10-CM | POA: Insufficient documentation

## 2022-09-04 DIAGNOSIS — Z20822 Contact with and (suspected) exposure to covid-19: Secondary | ICD-10-CM | POA: Diagnosis not present

## 2022-09-04 DIAGNOSIS — I509 Heart failure, unspecified: Secondary | ICD-10-CM | POA: Diagnosis not present

## 2022-09-04 DIAGNOSIS — R059 Cough, unspecified: Secondary | ICD-10-CM | POA: Diagnosis not present

## 2022-09-04 DIAGNOSIS — J209 Acute bronchitis, unspecified: Secondary | ICD-10-CM | POA: Diagnosis not present

## 2022-09-04 DIAGNOSIS — R0602 Shortness of breath: Secondary | ICD-10-CM | POA: Diagnosis not present

## 2022-09-04 HISTORY — DX: Unspecified atrial fibrillation: I48.91

## 2022-09-04 LAB — BASIC METABOLIC PANEL
Anion gap: 12 (ref 5–15)
BUN: 8 mg/dL (ref 8–23)
CO2: 25 mmol/L (ref 22–32)
Calcium: 9.1 mg/dL (ref 8.9–10.3)
Chloride: 88 mmol/L — ABNORMAL LOW (ref 98–111)
Creatinine, Ser: 0.44 mg/dL (ref 0.44–1.00)
GFR, Estimated: >60 mL/min (ref >60)
Glucose, Bld: 99 mg/dL (ref 70–99)
Potassium: 3.7 mmol/L (ref 3.5–5.1)
Sodium: 125 mmol/L — ABNORMAL LOW (ref 135–145)

## 2022-09-04 LAB — CBC WITH DIFFERENTIAL/PLATELET
Abs Immature Granulocytes: 0.02 10*3/uL (ref 0.00–0.07)
Basophils Absolute: 0.1 10*3/uL (ref 0.0–0.1)
Basophils Relative: 1 %
Eosinophils Absolute: 0.1 10*3/uL (ref 0.0–0.5)
Eosinophils Relative: 1 %
HCT: 48.9 % — ABNORMAL HIGH (ref 36.0–46.0)
Hemoglobin: 16.8 g/dL — ABNORMAL HIGH (ref 12.0–15.0)
Immature Granulocytes: 0 %
Lymphocytes Relative: 9 %
Lymphs Abs: 0.6 10*3/uL — ABNORMAL LOW (ref 0.7–4.0)
MCH: 33.7 pg (ref 26.0–34.0)
MCHC: 34.4 g/dL (ref 30.0–36.0)
MCV: 98.2 fL (ref 80.0–100.0)
Monocytes Absolute: 0.6 10*3/uL (ref 0.1–1.0)
Monocytes Relative: 9 %
Neutro Abs: 5.3 10*3/uL (ref 1.7–7.7)
Neutrophils Relative %: 80 %
Platelets: 262 10*3/uL (ref 150–400)
RBC: 4.98 MIL/uL (ref 3.87–5.11)
RDW: 12.5 % (ref 11.5–15.5)
WBC: 6.6 10*3/uL (ref 4.0–10.5)
nRBC: 0 % (ref 0.0–0.2)

## 2022-09-04 LAB — BRAIN NATRIURETIC PEPTIDE: B Natriuretic Peptide: 637 pg/mL — ABNORMAL HIGH (ref 0.0–100.0)

## 2022-09-04 MED ORDER — IPRATROPIUM-ALBUTEROL 0.5-2.5 (3) MG/3ML IN SOLN
3.0000 mL | Freq: Once | RESPIRATORY_TRACT | Status: AC
  Administered 2022-09-04: 3 mL via RESPIRATORY_TRACT
  Filled 2022-09-04: qty 3

## 2022-09-04 NOTE — ED Provider Notes (Signed)
[facility] EMERGENCY DEPARTMENT Provider Note   CSN: [id number] Arrival date & time: 09/04/22  2131     History {Add pertinent medical, surgical, social history, OB history to HPI:1} Chief Complaint  Patient presents with   Shortness of Breath    Cynthia Kelley is a 66 y.o. female.  She is brought in by her husband for evaluation of some shortness of breath and chest congestion that started this evening.  She is a smoker, history of lung cancer and A-fib on anticoagulation.  She also has some element of CHF.  She denies any fever chest pain abdominal pain vomiting diarrhea.  No sick contacts or recent travel.  She does continue to smoke.  She has had a nonproductive cough.  The history is provided by the patient and the spouse.  Shortness of Breath Severity:  Moderate Onset quality:  Gradual Timing:  Constant Progression:  Unchanged Chronicity:  New Relieved by:  None tried Worsened by:  Nothing Ineffective treatments:  None tried Associated symptoms: cough   Associated symptoms: no abdominal pain, no chest pain, no fever, no hemoptysis, no sore throat, no sputum production, no syncope and no wheezing   Risk factors: hx of cancer and tobacco use        Home Medications Prior to Admission medications   Medication Sig Start Date End Date Taking? Authorizing Provider  amiodarone (PACERONE) 200 MG tablet Take 200 mg by mouth daily. 02/06/21   [provider]  apixaban (ELIQUIS) 5 MG TABS tablet Take 5 mg by mouth 2 (two) times daily. 09/27/20   [provider]  furosemide (LASIX) 20 MG tablet Take 1 tablet (20 mg total) by mouth daily as needed for fluid. 03/04/22   Kathie Dike, MD  HYDROcodone-acetaminophen (NORCO/VICODIN) 5-325 MG tablet Take 1 tablet by mouth 2 (two) times daily as needed for moderate pain. 08/27/22   Volney American, PA-C  memantine (NAMENDA) 10 MG tablet Take 10 mg by mouth every morning. 02/05/21   [provider]  metoprolol  tartrate (LOPRESSOR) 25 MG tablet Take 1 tablet (25 mg total) by mouth 2 (two) times daily. 03/04/22   Kathie Dike, MD  Multiple Vitamins-Minerals (MULTIVITAMIN WITH MINERALS) tablet Take 1 tablet by mouth daily.    [provider]  oxyCODONE (OXY IR/ROXICODONE) 5 MG immediate release tablet Take 1 tablet (5 mg total) by mouth every 12 (twelve) hours as needed for moderate pain or severe pain. 04/21/22   Meredith Pel, MD  polyethylene glycol (MIRALAX / GLYCOLAX) 17 g packet Take 17 g by mouth daily. 03/05/22   Kathie Dike, MD  potassium chloride (KLOR-CON) 10 MEQ tablet Take 10 mEq by mouth 2 (two) times daily. 10/05/21   [provider]  sodium chloride 1 g tablet Take 1 tablet (1 g total) by mouth 2 (two) times daily with a meal. Patient taking differently: Take 1 g by mouth daily. 03/06/21   Barton Dubois, MD  traZODone (DESYREL) 50 MG tablet Take 50 mg by mouth at bedtime. 02/12/22   [provider]      Allergies    Patient has no known allergies.    Review of Systems   Review of Systems  Constitutional:  Negative for fever.  HENT:  Negative for sore throat.   Eyes:  Negative for visual disturbance.  Respiratory:  Positive for cough and shortness of breath. Negative for hemoptysis, sputum production and wheezing.   Cardiovascular:  Negative for chest pain and syncope.  Gastrointestinal:  Negative for abdominal pain.    Physical Exam Updated Vital Signs BP (!) 169/84 (BP Location: Left Arm)   Pulse 73   Temp 98 F (36.7 C) (Oral)   Resp (!) 24   Ht 5' 6.5" (1.689 m)   Wt 53.5 kg   SpO2 94%   BMI 18.76 kg/m  Physical Exam Vitals and nursing note reviewed.  Constitutional:      General: She is not in acute distress.    Appearance: She is well-developed.  HENT:     Head: Normocephalic and atraumatic.  Eyes:     Conjunctiva/sclera: Conjunctivae normal.  Cardiovascular:     Rate and Rhythm: Normal rate and regular rhythm.     Heart  sounds: No murmur heard. Pulmonary:     Effort: Tachypnea and accessory muscle usage present. No respiratory distress.     Breath sounds: Rhonchi present.  Abdominal:     Palpations: Abdomen is soft.     Tenderness: There is no abdominal tenderness.  Musculoskeletal:        General: No swelling.     Cervical back: Neck supple.     Right lower leg: No tenderness. No edema.     Left lower leg: No tenderness. No edema.  Skin:    General: Skin is warm and dry.     Capillary Refill: Capillary refill takes less than 2 seconds.  Neurological:     General: No focal deficit present.     Mental Status: She is alert.     ED Results / Procedures / Treatments   Labs (all labs ordered are listed, but only abnormal results are displayed) Labs Reviewed  RESP PANEL BY RT-PCR (FLU A&B, COVID) ARPGX2  BRAIN NATRIURETIC PEPTIDE  CBC WITH DIFFERENTIAL/PLATELET  BASIC METABOLIC PANEL    EKG EKG Interpretation  Date/Time:  Saturday September 04 2022 21:43:42 EST Ventricular Rate:  73 PR Interval:  202 QRS Duration: 68 QT Interval:  396 QTC Calculation: 436 R Axis:   72 Text Interpretation: Normal sinus rhythm Normal ECG When compared with ECG of 27-Feb-2022 02:46, T wave amplitude has increased in multiple leads Confirmed by Aletta Edouard [phone]) on 09/04/2022 9:53:37 PM  Radiology No results found.  Procedures Procedures  {Document cardiac monitor, telemetry assessment procedure when appropriate:1}  Medications Ordered in ED Medications  ipratropium-albuterol (DUONEB) 0.5-2.5 (3) MG/3ML nebulizer solution 3 mL (has no administration in time range)    ED Course/ Medical Decision Making/ A&P Clinical Course as of 09/04/22 2247  Sat Sep 04, 2022  2153 EKG not crossing in epic.  Normal sinus rhythm rate of 73 normal intervals.  Does have increased T wave amplitude multiple leads new from prior EKG. [MB]  2204 Chest x-ray does not show any definite pneumonia.  Radiology commented upon  some infrahilar mass cannot exclude metastasis [MB]    Clinical Course User Index [MB] Hayden Rasmussen, MD                           Medical Decision Making Amount and/or Complexity of Data Reviewed Labs: ordered. Radiology: ordered.  Risk Prescription drug management.   This patient complains of ***; this involves an extensive number of treatment Options and is a complaint that carries with it a high risk of complications and morbidity. The differential includes ***  I ordered, reviewed and interpreted labs, which included *** I ordered medication *** and reviewed PMP when indicated. I ordered imaging studies which included ***  and I independently    visualized and interpreted imaging which showed *** Additional history obtained from *** Previous records obtained and reviewed *** I consulted *** and discussed lab and imaging findings and discussed disposition.  Cardiac monitoring reviewed, *** Social determinants considered, *** Critical Interventions: ***  After the interventions stated above, I reevaluated the patient and found *** Admission and further testing considered, ***   {Document critical care time when appropriate:1} {Document review of labs and clinical decision tools ie heart score, Chads2Vasc2 etc:1}  {Document your independent review of radiology images, and any outside records:1} {Document your discussion with family members, caretakers, and with consultants:1} {Document social determinants of health affecting pt's care:1} {Document your decision making why or why not admission, treatments were needed:1} Final Clinical Impression(s) / ED Diagnoses Final diagnoses:  None    Rx / DC Orders ED Discharge Orders     None

## 2022-09-04 NOTE — ED Triage Notes (Signed)
Pt with SOB that started 20 minutes ago.  Congestion, + NP cough, pt with stage 4 lung CA per husband.  Unknown of fever at home.  Pt last chemo was 6 months ago, husband states pt has short term memory loss. Pt continues to smoke cigarettes.

## 2022-09-05 LAB — RESP PANEL BY RT-PCR (FLU A&B, COVID) ARPGX2
Influenza A by PCR: NEGATIVE
Influenza B by PCR: NEGATIVE
SARS Coronavirus 2 by RT PCR: NEGATIVE

## 2022-09-05 MED ORDER — PREDNISONE 20 MG PO TABS
40.0000 mg | ORAL_TABLET | Freq: Once | ORAL | Status: AC
  Administered 2022-09-05: 40 mg via ORAL
  Filled 2022-09-05: qty 2

## 2022-09-05 MED ORDER — DOXYCYCLINE HYCLATE 100 MG PO CAPS: 100.0000 mg | ORAL_CAPSULE | Freq: Two times a day (BID) | ORAL | 0 refills | Status: DC

## 2022-09-05 MED ORDER — DOXYCYCLINE HYCLATE 100 MG PO TABS
100.0000 mg | ORAL_TABLET | Freq: Once | ORAL | Status: AC
  Administered 2022-09-05: 100 mg via ORAL
  Filled 2022-09-05: qty 1

## 2022-09-05 MED ORDER — ALBUTEROL SULFATE HFA 108 (90 BASE) MCG/ACT IN AERS
2.0000 | INHALATION_SPRAY | Freq: Once | RESPIRATORY_TRACT | Status: AC
  Administered 2022-09-05: 2 via RESPIRATORY_TRACT
  Filled 2022-09-05: qty 6.7

## 2022-09-05 MED ORDER — PREDNISONE 10 MG PO TABS: 20.0000 mg | ORAL_TABLET | Freq: Two times a day (BID) | ORAL | 0 refills | Status: DC

## 2022-09-05 NOTE — ED Provider Notes (Signed)
  Physical Exam  BP (!) 169/84 (BP Location: Left Arm)   Pulse 73   Temp 98 F (36.7 C) (Oral)   Resp (!) 24   Ht 5' 6.5" (1.689 m)   Wt 53.5 kg   SpO2 94%   BMI 18.76 kg/m   Physical Exam Vitals and nursing note reviewed.  Constitutional:      General: She is not in acute distress.    Appearance: She is well-developed. She is not diaphoretic.  HENT:     Head: Normocephalic and atraumatic.  Cardiovascular:     Rate and Rhythm: Normal rate and regular rhythm.     Heart sounds: No murmur heard.    No friction rub. No gallop.  Pulmonary:     Effort: Pulmonary effort is normal. No respiratory distress.     Breath sounds: Normal breath sounds. No wheezing.  Abdominal:     General: Bowel sounds are normal. There is no distension.     Palpations: Abdomen is soft.     Tenderness: There is no abdominal tenderness.  Musculoskeletal:        General: Normal range of motion.     Cervical back: Normal range of motion and neck supple.  Skin:    General: Skin is warm and dry.  Neurological:     General: No focal deficit present.     Mental Status: She is alert and oriented to person, place, and time.     Procedures  Procedures  ED Course / MDM   Clinical Course as of 09/05/22 0015  Sat Sep 04, 2022  2153 EKG not crossing in epic.  Normal sinus rhythm rate of 73 normal intervals.  Does have increased T wave amplitude multiple leads new from prior EKG. [MB]  2204 Chest x-ray does not show any definite pneumonia.  Radiology commented upon some infrahilar mass cannot exclude metastasis [MB]    Clinical Course User Index [MB] Hayden Rasmussen, MD   Care assumed from Dr. Melina Copa at shift change.  Patient awaiting results of blood work.  She presented here with complaints of cough and difficulty breathing.  She received a breathing treatment upon arrival to the ER which helped considerably.  Patient's laboratory studies have returned showing a sodium level of 125, but are basically  unremarkable otherwise.  Chest x-ray shows streaky opacities in the right infrahilar lung, potentially infectious or inflammatory, but could also indicate metastatic disease.  Patient does have a history of lung cancer treated in the past and follows with an oncologist.  I have advised her to follow this finding up with them.  At this point, patient to be treated for possible pneumonia with prednisone, doxycycline, and will be given an albuterol MDI to use as needed.       Veryl Speak, MD 09/05/22 [phone]

## 2022-09-05 NOTE — Discharge Instructions (Signed)
Begin taking doxycycline and prednisone as prescribed.  Use the albuterol inhaler, 2 puffs every 4 hours as needed.  Follow-up with your oncologist and primary doctor in the next week, and return to the ER if symptoms significantly worsen or change.

## 2022-09-07 ENCOUNTER — Encounter: Payer: Self-pay | Admitting: Orthopedic Surgery

## 2022-09-07 ENCOUNTER — Ambulatory Visit (INDEPENDENT_AMBULATORY_CARE_PROVIDER_SITE_OTHER): Payer: Medicare Other

## 2022-09-07 ENCOUNTER — Ambulatory Visit (INDEPENDENT_AMBULATORY_CARE_PROVIDER_SITE_OTHER): Payer: Medicare Other | Admitting: Orthopedic Surgery

## 2022-09-07 DIAGNOSIS — S62021D Displaced fracture of middle third of navicular [scaphoid] bone of right wrist, subsequent encounter for fracture with routine healing: Secondary | ICD-10-CM

## 2022-09-07 DIAGNOSIS — S62101D Fracture of unspecified carpal bone, right wrist, subsequent encounter for fracture with routine healing: Secondary | ICD-10-CM | POA: Diagnosis not present

## 2022-09-07 MED ORDER — HYDROCODONE-ACETAMINOPHEN 5-325 MG PO TABS: 1.0000 | ORAL_TABLET | Freq: Four times a day (QID) | ORAL | 0 refills | Status: DC | PRN

## 2022-09-07 NOTE — Patient Instructions (Signed)

## 2022-09-07 NOTE — Progress Notes (Signed)
[facility]thopaedic Clinic Return  Assessment: Cynthia Kelley is a 66 y.o. female with the following: Right distal radius fracture Displaced scaphoid fracture   Plan: Cynthia Kelley sustained a right distal radius fracture, as well as a displaced right scaphoid fracture.  Injury was sustained approximately 2 weeks ago.  Repeat radiographs today demonstrates stable alignment, compared to prior x-rays.  Moreover, on physical exam, she is tolerating motion already.  There is minimal swelling.  Given her extensive medical history, and recent issues with shortness of breath, it would not be appropriate to consider surgery.  This was discussed with the patient and her husband.  They are in agreement with this plan.  She was placed into a short arm thumb spica cast.  I will see her back in approximately 10 days for repeat evaluation.  Cast application - Right short arm cast -thumb spica   Verbal consent was obtained and the correct extremity was identified. A well padded, appropriately molded short arm cast was applied to the Right arm Fingers remained warm and well perfused.   There were no sharp edges Patient tolerated the procedure well Cast care instructions were provided    Follow-up: Return in about 10 days (around 09/17/2022).   Subjective:  Chief Complaint  Patient presents with   Follow-up    Recheck on right wrist fracture    History of Present Illness: Cynthia Kelley is a 66 y.o. female who returns to clinic for evaluation of right wrist pain.  She sustained a right distal radius fracture, as well as a displaced scaphoid fracture approximately 2 weeks ago.  I saw her in clinic a week ago.  She has remained in a splint since being evaluated at an urgent care center.  Of note, she presented to the emergency department due to shortness of breath.  No further treatment was needed.  She has taken some medication for pain, but has not needed it consistently.   Review of Systems: No fevers or  chills No numbness or tingling No chest pain No shortness of breath No bowel or bladder dysfunction No GI distress No headaches   Objective: There were no vitals taken for this visit.  Physical Exam:  Alert.  She demonstrates some confusion, which is her baseline.  After removal of the splint, there is no skin breakdown.  There is some residual swelling and bruising.  She tolerates gentle range of motion of her wrist with minimal pain.  She has tenderness to palpation over the distal radius, as well as the anatomic snuffbox.  Fingers are warm and well-perfused.  Sensation is intact throughout the right hand.  IMAGING: I personally ordered and reviewed the following images:  X-rays of the right wrist were obtained in clinic today.  No immobilization.  These were compared to prior x-rays.  There has been no interval displacement.  There is loss of volar tilt, with mild dorsal translation of the fracture.  There is a displaced fracture of the scaphoid.  Overall alignment remains unchanged.  Impression: Right distal radius and scaphoid fractures in stable alignment.  Mordecai Rasmussen, MD 09/07/2022 6:41 PM

## 2022-09-16 IMAGING — CT CT ANGIO CHEST
2 of 6 series · 17 of 36 positions shown · IV contrast (Omnipaque or Isovue)
Comparison: Outside CT chest 12/09/2020 (report only, images
unavailable) outside CT chest 09/24/2020

CLINICAL DATA: Shortness of breath for 1 hour, stage IV lung cancer

EXAM:
CT ANGIOGRAPHY CHEST WITH CONTRAST
TECHNIQUE: Multidetector CT imaging of the chest was performed using the
standard protocol during bolus administration of intravenous
contrast. Multiplanar CT image reconstructions and MIPs were
obtained to evaluate the vascular anatomy.
CONTRAST:  68mL OMNIPAQUE IOHEXOL 350 MG/ML SOLN

[Series 8: pe axial thins · axial · 0.76mm/px · z∈[-326,-37]mm · 16 of 400 slices shown]
[im 20/400  lung]
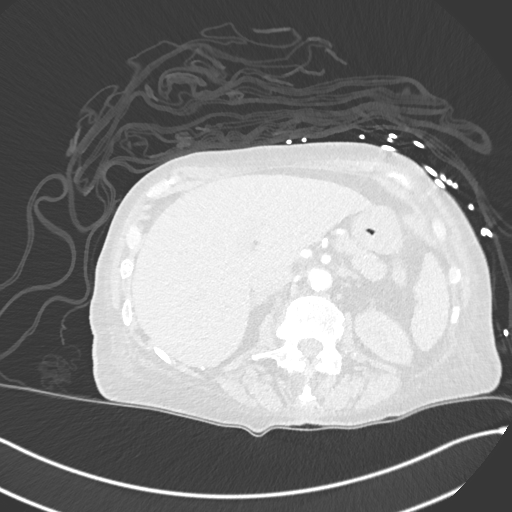
[im 39/400  mediastinal]
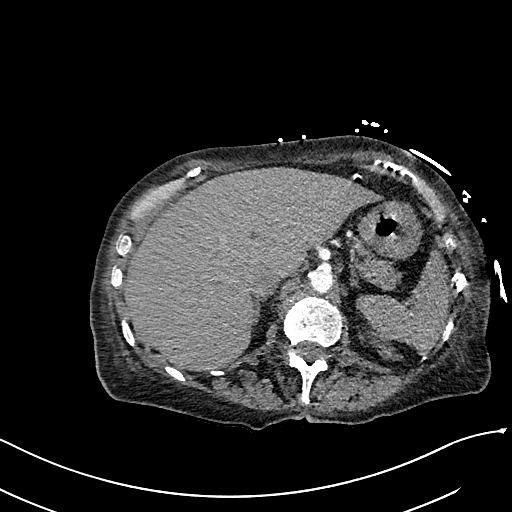
[im 77/400  lung]
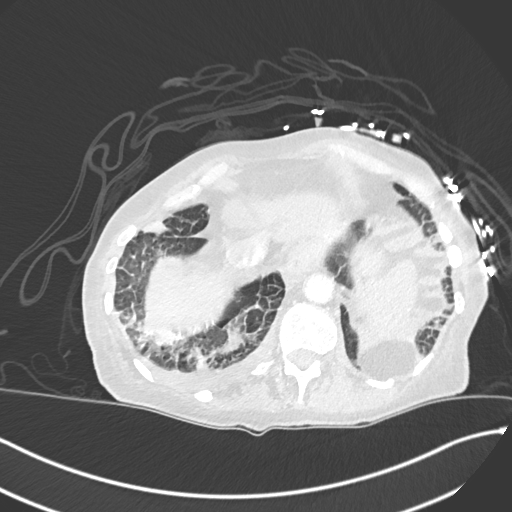
[im 96/400  mediastinal]
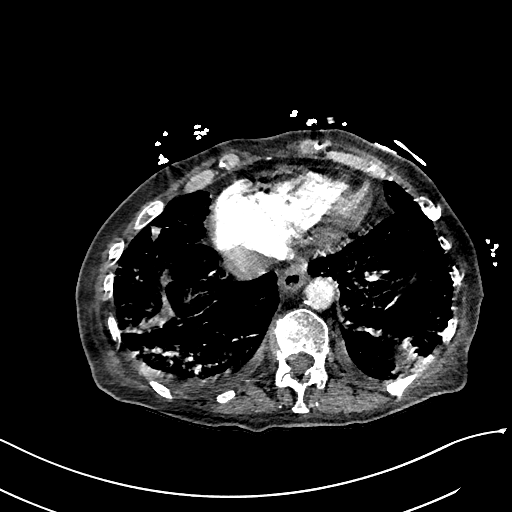
[im 115/400  lung]
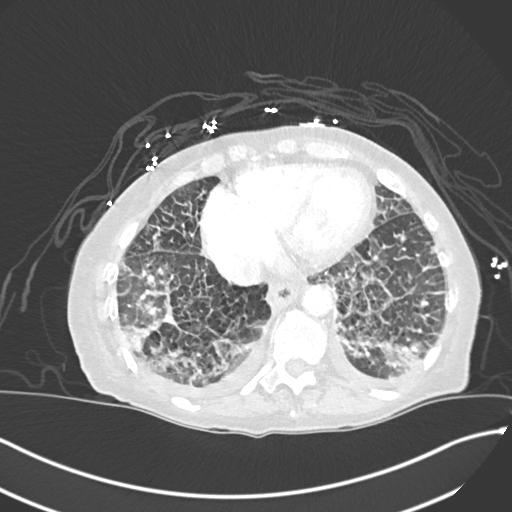
[im 134/400  mediastinal]
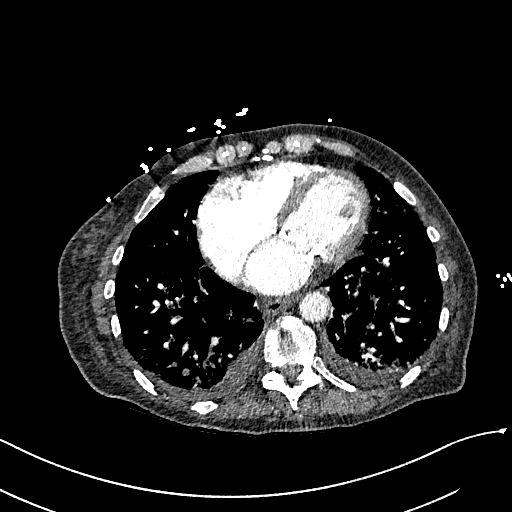
[im 172/400  lung]
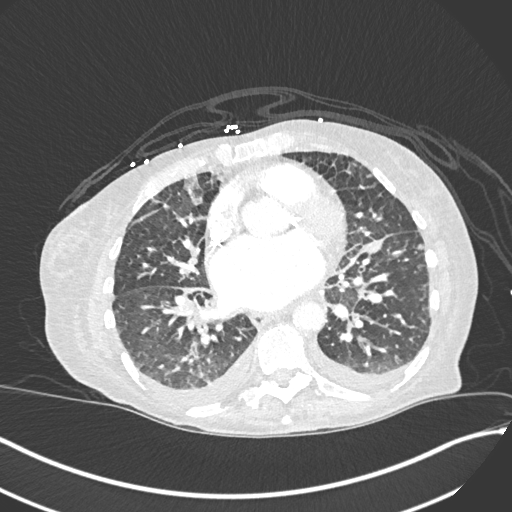
[im 191/400  mediastinal]
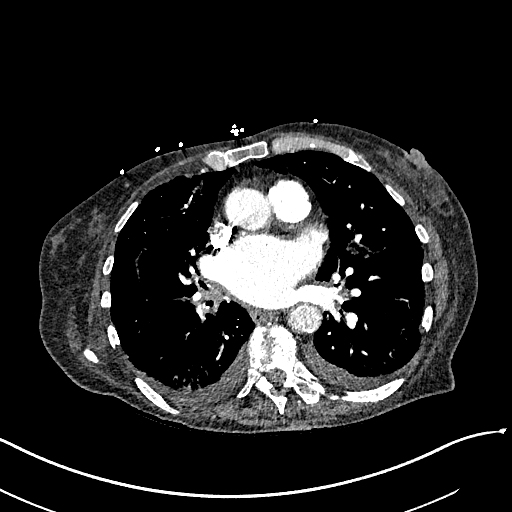
[im 210/400  lung]
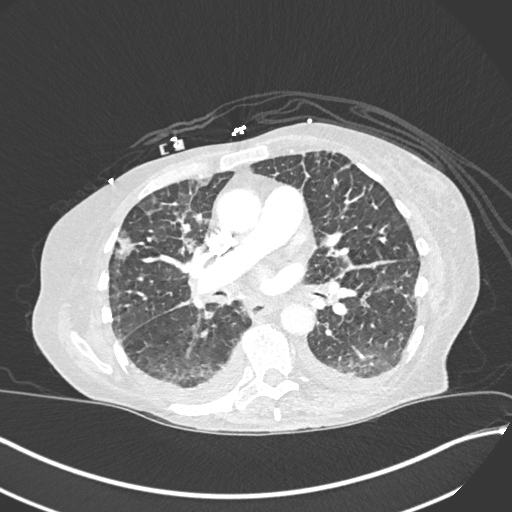
[im 229/400  mediastinal]
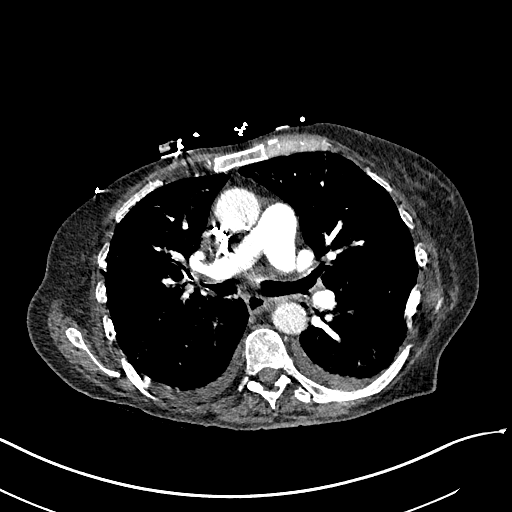
[im 267/400  lung]
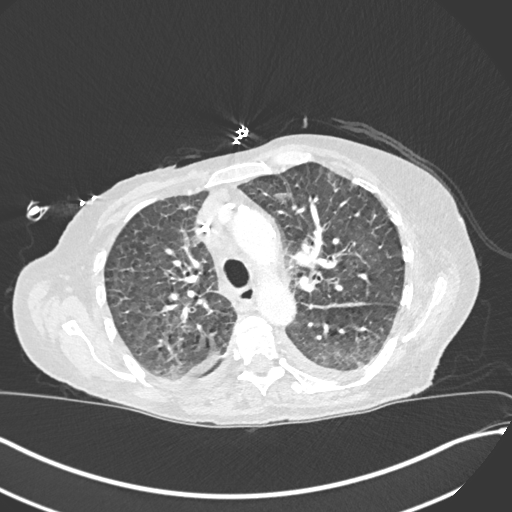
[im 286/400  mediastinal]
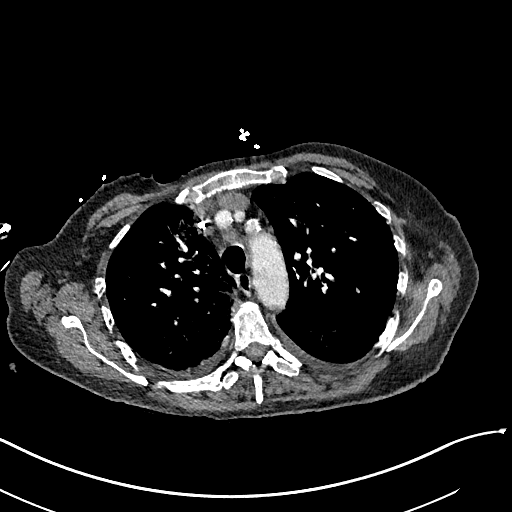
[im 305/400  lung]
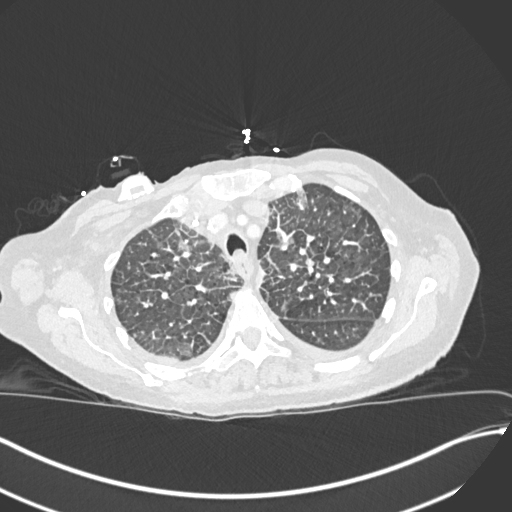
[im 324/400  mediastinal]
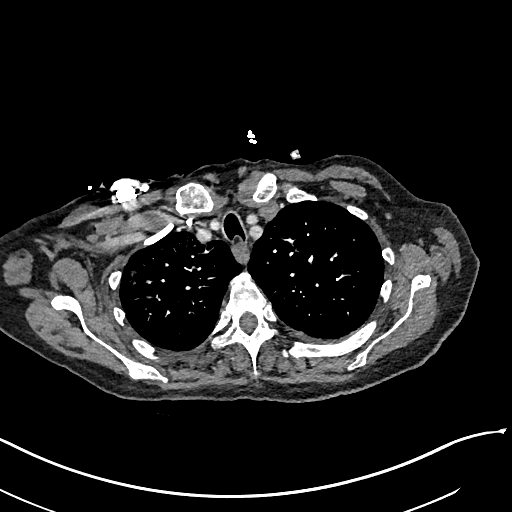
[im 362/400  lung]
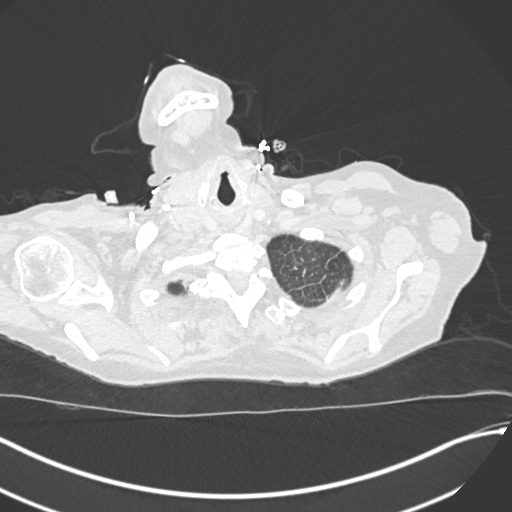
[im 381/400  mediastinal]
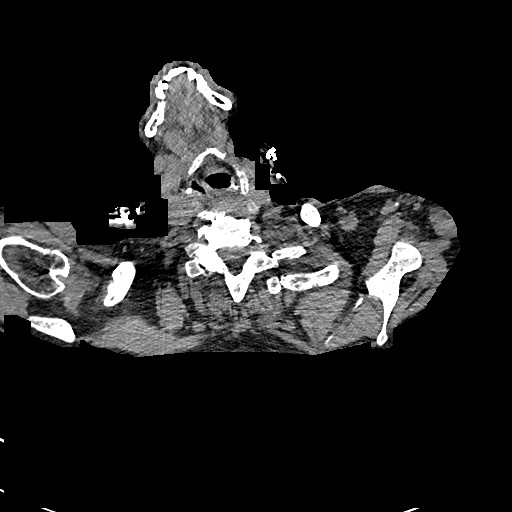

[Series 10: cor soft · coronal · 0.67mm/px · 1 of 137 slices shown]
[im 69/137  mediastinal]
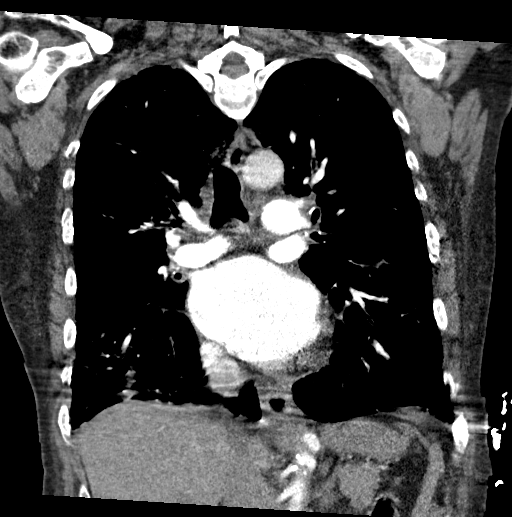

[17 of 36 positions shown; findings below may reference images not displayed]

FINDINGS: Cardiovascular: Satisfactory opacification the pulmonary arteries to
the segmental level. No pulmonary artery filling defects are
identified. Central pulmonary arteries are normal caliber.
Borderline cardiomegaly with biatrial enlargement. Calcifications of
the mitral annulus. Coronary artery calcifications are present as
well. No pericardial effusion. Atherosclerotic plaque within the
normal caliber aorta. No acute luminal abnormality of the imaged
aorta. No periaortic stranding or hemorrhage. Normal 3 vessel
branching of the aortic arch. Proximal great vessels are mildly
calcified but otherwise unremarkable and free of acute abnormality.
No major venous abnormalities. Right IJ approach Port-A-Cath tip
terminates in the lower SVC.

Mediastinum/Nodes: No pathologically enlarged or enlarging
mediastinal, hilar or axillary adenopathy. No mediastinal fluid or
gas. Normal thyroid gland and thoracic inlet. No acute abnormality
of the trachea or esophagus. No worrisome mediastinal, hilar or
axillary adenopathy.

Lungs/Pleura: Diffuse pulmonary vascular redistribution,
peribronchovascular thickening, and both interlobular septal and
fissural thickening throughout both lungs with bilateral effusions
favoring pulmonary edema. There are additional areas of mixed patchy
ground-glass and consolidative opacity in the lungs which could
reflect developing alveolar edema though an acute infectious or
inflammatory process is difficult to fully exclude particularly
given the anti dependent areas ground-glass in both upper lobes.
Stable post treatment related changes are seen in the right lung
base. Scattered micro nodules previously seen in the lungs are
poorly visualized due to extensive respiratory motion artifact. Some
peripheral ground-glass opacity seen in the left upper lobe is
grossly unchanged though difficult to fully assess given additional
lung parenchyma findings.

Upper Abdomen: No acute abnormalities present in the visualized
portions of the upper abdomen.

Musculoskeletal: Multilevel degenerative changes are present in the
imaged portions of the spine. No acute osseous abnormality or
suspicious osseous lesion. Multiple remote left-sided rib fractures
are again seen. No acute osseous abnormalities. No suspicious lytic
or blastic lesions. No worrisome chest wall masses or lesions. Mild
circumferential body wall edema. Right IJ approach Port-A-Cath.

Review of the MIP images confirms the above findings.
IMPRESSION: 1. No evidence of acute pulmonary artery embolism.
2. Diffuse pulmonary vascular redistribution, peribronchovascular
thickening, and bilateral effusions favoring pulmonary edema.
Additional areas of mixed patchy ground-glass and consolidative
opacity in the lungs could reflect developing alveolar edema though
an acute infectious or inflammatory process is difficult to fully
exclude particularly given the anti dependent areas ground-glass in
both upper lobes.
3. Stable post treatment related changes in the right lung base.
4. Scattered micro nodules previously seen in the lungs are poorly
visualized due to extensive respiratory motion artifact. Accurate
staging of patient's malignancy is difficult to perform accurately
given extensive respiratory motion artifact, underlying parenchymal
processes above, and the absence of images from the patient's most
recent comparison exam dated 12/09/2020.
[DATE]. Aortic Atherosclerosis (AR78S-[id number].P).

## 2022-09-17 ENCOUNTER — Ambulatory Visit (INDEPENDENT_AMBULATORY_CARE_PROVIDER_SITE_OTHER): Payer: Medicare Other | Admitting: Orthopedic Surgery

## 2022-09-17 ENCOUNTER — Encounter: Payer: Self-pay | Admitting: Orthopedic Surgery

## 2022-09-17 ENCOUNTER — Ambulatory Visit (INDEPENDENT_AMBULATORY_CARE_PROVIDER_SITE_OTHER): Payer: Medicare Other

## 2022-09-17 VITALS — Ht 66.5 in | Wt 118.0 lb

## 2022-09-17 DIAGNOSIS — S62101D Fracture of unspecified carpal bone, right wrist, subsequent encounter for fracture with routine healing: Secondary | ICD-10-CM | POA: Diagnosis not present

## 2022-09-17 NOTE — Patient Instructions (Signed)

## 2022-09-17 NOTE — Progress Notes (Signed)
Orthopaedic Clinic Return  Assessment: Cynthia Kelley is a 66 y.o. female with the following: Right distal radius fracture Displaced scaphoid fracture   Plan: [name] sustained a right distal radius fracture, as well as a displaced right scaphoid fracture.  Injury was sustained approximately 3-4 weeks ago.  Radiographs are stable.  She tolerates gentle range of motion.  No interval displacement.  She is not a good surgical candidate.  Will except a mild deformity.  Continue with nonoperative management.  Cast was placed today.  Follow-up in 2 weeks.   Cast application - Right short arm cast -thumb spica   Verbal consent was obtained and the correct extremity was identified. A well padded, appropriately molded short arm cast was applied to the Right arm Fingers remained warm and well perfused.   There were no sharp edges Patient tolerated the procedure well Cast care instructions were provided    Follow-up: Return in about 2 weeks (around 10/01/2022).   Subjective:  Chief Complaint  Patient presents with   Fracture    Rt wrist DOI 08/26/22    History of Present Illness: Cynthia Kelley is a 66 y.o. female who returns to clinic for evaluation of right wrist pain.  She sustained a right distal radius fracture, as well as a displaced scaphoid fracture approximately 3-4 weeks ago.  She has been doing well with the cast.  She has minimal pain.  No concerns from family.   Review of Systems: No fevers or chills No numbness or tingling No chest pain No shortness of breath No bowel or bladder dysfunction No GI distress No headaches   Objective: Ht 5' 6.5" (1.689 m)   Wt 118 lb (53.5 kg)   BMI 18.76 kg/m   Physical Exam:  Alert.  She demonstrates some confusion, which is her baseline.  After removal of the cast, there is no skin breakdown.  Mild residual swelling.  No bruising is appreciated.  No deformities appreciated at the wrist.  She tolerates gentle range of  motion.  IMAGING: I personally ordered and reviewed the following images:  X-rays of the right wrist were obtained in clinic today.  These were compared to prior x-rays.  No new injuries are appreciated.  No interval displacement of the distal radius, as well as the scaphoid fracture.  Mild dorsal angulation of the joint line, on the lateral projection.  Mild callus formation is appreciated.  Impression: Right distal radius and scaphoid fractures in stable alignment    Mordecai Rasmussen, MD 09/17/2022 11:04 AM

## 2022-09-28 DIAGNOSIS — S2241XD Multiple fractures of ribs, right side, subsequent encounter for fracture with routine healing: Secondary | ICD-10-CM | POA: Diagnosis not present

## 2022-09-28 DIAGNOSIS — R413 Other amnesia: Secondary | ICD-10-CM | POA: Diagnosis not present

## 2022-09-28 DIAGNOSIS — I509 Heart failure, unspecified: Secondary | ICD-10-CM | POA: Diagnosis not present

## 2022-09-28 DIAGNOSIS — C3491 Malignant neoplasm of unspecified part of right bronchus or lung: Secondary | ICD-10-CM | POA: Diagnosis not present

## 2022-09-28 DIAGNOSIS — C7951 Secondary malignant neoplasm of bone: Secondary | ICD-10-CM | POA: Diagnosis not present

## 2022-09-28 DIAGNOSIS — R918 Other nonspecific abnormal finding of lung field: Secondary | ICD-10-CM | POA: Diagnosis not present

## 2022-10-01 ENCOUNTER — Ambulatory Visit (INDEPENDENT_AMBULATORY_CARE_PROVIDER_SITE_OTHER): Payer: Medicare Other

## 2022-10-01 ENCOUNTER — Encounter: Payer: Self-pay | Admitting: Orthopedic Surgery

## 2022-10-01 ENCOUNTER — Ambulatory Visit (INDEPENDENT_AMBULATORY_CARE_PROVIDER_SITE_OTHER): Payer: Medicare Other | Admitting: Orthopedic Surgery

## 2022-10-01 VITALS — Ht 66.5 in | Wt 118.0 lb

## 2022-10-01 DIAGNOSIS — S62101D Fracture of unspecified carpal bone, right wrist, subsequent encounter for fracture with routine healing: Secondary | ICD-10-CM

## 2022-10-01 DIAGNOSIS — S62021D Displaced fracture of middle third of navicular [scaphoid] bone of right wrist, subsequent encounter for fracture with routine healing: Secondary | ICD-10-CM

## 2022-10-01 NOTE — Patient Instructions (Signed)
Wrist brace at all times for the next 2 weeks.  Okay to remove for hygiene, and to initiate range of motion of the wrist and fingers.  In 2 weeks, can start removing the brace for regular activities.  Gradual increase in activity starting in 2 weeks.  Clinic if you have any issues.

## 2022-10-01 NOTE — Progress Notes (Signed)
Orthopaedic Clinic Return  Assessment: Cynthia Kelley is a 67 y.o. female with the following: Right distal radius fracture Displaced scaphoid fracture   Plan: Cynthia Kelley sustained a right distal radius fracture, as well as a displaced right scaphoid fracture.  Radiographs are stable.  She has minimal pain.  Mild deformity is appreciated on physical exam.  We will transition her to a removable wrist brace today.  Okay to remove for hygiene, and to initiate range of motion.  Limit lifting.  In 2 weeks, the brace can be removed, and she can start to advance her activities.  Follow-up in 1 month..   Follow-up: Return in about 4 weeks (around 10/29/2022).   Subjective:  Chief Complaint  Patient presents with   Fracture    Rt wrist DOI 08/26/22    History of Present Illness: Cynthia Kelley is a 67 y.o. female who returns to clinic for evaluation of right wrist pain.  She sustained a right distal radius fracture, as well as a displaced scaphoid fracture approximately 6 weeks ago.  She tolerated the cast.  Minimal pain.  She is not taking anything specifically for her wrist.  Upon removal of this cast, she is tolerating gentle motion.  Minimal pain.  Review of Systems: No fevers or chills No numbness or tingling No chest pain No shortness of breath No bowel or bladder dysfunction No GI distress No headaches   Objective: Ht 5' 6.5" (1.689 m)   Wt 118 lb (53.5 kg)   BMI 18.76 kg/m   Physical Exam:  Alert.  Orientation at her baseline.  Right wrist with no skin breakdown.  Mild bruising.  Mild swelling.  Minimal tenderness to palpation.  She tolerates gentle flexion and extension.  She is able to make a full fist.  IMAGING: I personally ordered and reviewed the following images:  X-rays of the right wrist were obtained in clinic today.  Distal radius fracture is in stable alignment.  Dorsal angulation of the joint line, as demonstrated on the lateral projection.  This remains  unchanged.  Fracture through the mid aspect of the scaphoid is also appreciated, without further displacement.  Impression: Right distal radius and scaphoid fracture in stable alignment   Mordecai Rasmussen, MD 10/01/2022 12:04 PM

## 2022-10-29 ENCOUNTER — Ambulatory Visit (INDEPENDENT_AMBULATORY_CARE_PROVIDER_SITE_OTHER): Payer: Medicare Other

## 2022-10-29 ENCOUNTER — Encounter: Payer: Self-pay | Admitting: Orthopedic Surgery

## 2022-10-29 ENCOUNTER — Ambulatory Visit (INDEPENDENT_AMBULATORY_CARE_PROVIDER_SITE_OTHER): Payer: Medicare Other | Admitting: Orthopedic Surgery

## 2022-10-29 DIAGNOSIS — S62101D Fracture of unspecified carpal bone, right wrist, subsequent encounter for fracture with routine healing: Secondary | ICD-10-CM | POA: Diagnosis not present

## 2022-10-29 DIAGNOSIS — S62021D Displaced fracture of middle third of navicular [scaphoid] bone of right wrist, subsequent encounter for fracture with routine healing: Secondary | ICD-10-CM | POA: Diagnosis not present

## 2022-10-29 NOTE — Progress Notes (Signed)
Orthopaedic Clinic Return  Assessment: Cynthia Kelley is a 67 y.o. female with the following: Right distal radius fracture Displaced scaphoid fracture   Plan: [name] sustained a right distal radius fracture, as well as a displaced right scaphoid fracture.  Updated radiographs demonstrates interval consolidation.  She has no pain.  She can continue to use the brace as needed.  Okay to advance her activities.  She will follow-up as needed.   Follow-up: Return if symptoms worsen or fail to improve.   Subjective:  Chief Complaint  Patient presents with   Follow-up    Recheck on right wrist fracture    History of Present Illness: Cynthia Kelley is a 67 y.o. female who returns to clinic for evaluation of right wrist pain.  She sustained a right distal radius fracture, as well as a displaced scaphoid fracture greater than 2 months ago.  She has been wearing a brace for the past month.  No pain in the right wrist.  Limited motion.  No complaints at this time.    Review of Systems: No fevers or chills No numbness or tingling No chest pain No shortness of breath No bowel or bladder dysfunction No GI distress No headaches   Objective: There were no vitals taken for this visit.  Physical Exam:  Alert.  Orientation at her baseline.  Right wrist with mild deformity.  Minimal swelling.  No bruising.  She tolerates gentle range of motion, with some discomfort at extremes.  She is able to make a full fist.  Sensation intact throughout the right hand.  IMAGING: I personally ordered and reviewed the following images:  X-rays of the right wrist were obtained in clinic today.  These are compared to prior x-rays.  Distal radius fracture is in stable position.  There has been interval consolidation at the fracture site.  No further displacement of the scaphoid fracture.  Dorsal angulation of the joint line, remains unchanged.  No bony lesions.  Impression: Right distal radius fracture and  scaphoid fracture in stable alignment   Cynthia Rasmussen, MD 10/29/2022 10:40 AM

## 2022-11-15 ENCOUNTER — Telehealth: Payer: Self-pay | Admitting: Orthopedic Surgery

## 2022-11-15 NOTE — Telephone Encounter (Signed)
Returned the patient's call, lvm for her to call us back.  In her message she kept mentioning Dr. Aline Brochure.  She sees Dr. Amedeo Kinsman.  I'm not sure what she is needing.

## 2022-11-16 ENCOUNTER — Ambulatory Visit: Payer: Medicare Other | Admitting: Orthopedic Surgery

## 2022-11-16 ENCOUNTER — Ambulatory Visit (INDEPENDENT_AMBULATORY_CARE_PROVIDER_SITE_OTHER): Payer: Medicare Other

## 2022-11-16 ENCOUNTER — Encounter: Payer: Self-pay | Admitting: Orthopedic Surgery

## 2022-11-16 VITALS — Ht 66.5 in | Wt 119.0 lb

## 2022-11-16 DIAGNOSIS — M79672 Pain in left foot: Secondary | ICD-10-CM

## 2022-11-17 ENCOUNTER — Encounter: Payer: Self-pay | Admitting: Orthopedic Surgery

## 2022-11-17 NOTE — Progress Notes (Signed)
Orthopaedic Clinic Return  Assessment: Cynthia Kelley is a 67 y.o. female with the following: Medial left foot pain, atraumatic  Plan: Cynthia Kelley has pain in the medial, midfoot area of the left foot.  This is within the arch.  There is point tenderness.  No bruising.  No swelling.  Atraumatic onset.  However, she does have dementia, and it is possible that an injury was sustained.  On physical exam, she appears to have a cavovarus foot alignment.  This could be contributing to her current pain.  Because she has less difficulty in supportive shoes, I have encouraged her to continue with running shoes is much as possible.  Can treat with topical ointments, sprays and patches.  Heat and/or ice.  Radiographs are negative.  She will contact the clinic if she has further issues.  Follow-up: Return if symptoms worsen or fail to improve.   Subjective:  Chief Complaint  Patient presents with   Foot Pain    Lt foot pain in arch for 1 wk. No known injury.    History of Present Illness: Cynthia Kelley is a 67 y.o. female who returns to clinic for evaluation of left foot pain.  She is well-known to my clinic.  She presents to clinic again today with her husband.  Over the past week, she has been complaining of pain in the medial left midfoot area.  No specific injury.  However, she does note that she has poor memory, and it is possible that she has injured her foot.  No issues with the left foot or ankle prior.  When she is wearing running shoes, the left foot feels better.  However, when she is in slippers at home, she notes the pain is worse.  Review of Systems: No fevers or chills No numbness or tingling No chest pain No shortness of breath No bowel or bladder dysfunction No GI distress No headaches   Objective: Ht 5' 6.5" (1.689 m)   Wt 119 lb (54 kg)   BMI 18.92 kg/m   Physical Exam:  Pleasant female.  Alert.  Left foot without swelling.  No bruising.  No redness over the medial midfoot.   She is point tender at the talonavicular joint.  Cavovarus alignment.  No tenderness over the lateral border of her foot.  Toes are warm and well-perfused.  IMAGING: I personally ordered and reviewed the following images:  None weightbearing x-rays of the left foot were obtained in clinic today.  No acute injuries are noted.  Positioning of the foot is consistent with cavovarus alignment.  No callus formation is appreciated.  Minimal degenerative changes.  No evidence of fracture or dislocation.  Impression: Negative left foot x-ray  Mordecai Rasmussen, MD 11/17/2022 8:17 AM

## 2022-11-25 ENCOUNTER — Encounter: Payer: Self-pay | Admitting: Radiology

## 2022-12-16 ENCOUNTER — Encounter: Payer: Self-pay | Admitting: Internal Medicine

## 2022-12-16 ENCOUNTER — Ambulatory Visit (INDEPENDENT_AMBULATORY_CARE_PROVIDER_SITE_OTHER): Payer: Medicare Other | Admitting: Internal Medicine

## 2022-12-16 DIAGNOSIS — Z0000 Encounter for general adult medical examination without abnormal findings: Secondary | ICD-10-CM | POA: Diagnosis not present

## 2022-12-16 NOTE — Patient Instructions (Signed)

## 2022-12-16 NOTE — Progress Notes (Signed)
Subjective:   Cynthia Kelley is a 67 y.o. female who presents for Medicare Annual (Subsequent) preventive examination.  Review of Systems    I connected with  Maryrita Grizzell on 12/16/22 by a audio enabled telemedicine application and verified that I am speaking with the correct person using two identifiers.  Patient Location: Home  Provider Location: Office/Clinic  I discussed the limitations of evaluation and management by telemedicine. The patient expressed understanding and agreed to proceed.        Objective:    There were no vitals filed for this visit. There is no height or weight on file to calculate BMI.     06/15/2022    1:28 PM 02/27/2022    5:43 AM 02/27/2022    2:15 AM 05/30/2021   10:39 AM 03/03/2021   10:38 AM 03/02/2021   11:26 PM 01/12/2021   12:04 PM  Advanced Directives  Does Patient Have a Medical Advance Directive? Yes  No Yes Yes No   Type of Paramedic of [place];Living will   Living will Living will    Does patient want to make changes to medical advance directive? No - Guardian declined  No - Patient declined  No - Patient declined    Copy of [place] in Chart? No - copy requested        Would patient like information on creating a medical advance directive?  No - Patient declined  No - Patient declined No - Patient declined No - Patient declined No - Patient declined    Current Medications (verified) Outpatient Encounter Medications as of 12/16/2022  Medication Sig   amiodarone (PACERONE) 200 MG tablet Take 200 mg by mouth daily.   apixaban (ELIQUIS) 5 MG TABS tablet Take 5 mg by mouth 2 (two) times daily.   doxycycline (VIBRAMYCIN) 100 MG capsule Take 1 capsule (100 mg total) by mouth 2 (two) times daily. One po bid x 7 days   furosemide (LASIX) 20 MG tablet Take 1 tablet (20 mg total) by mouth daily as needed for fluid.   HYDROcodone-acetaminophen (NORCO/VICODIN) 5-325 MG tablet Take 1 tablet by mouth every 6 (six)  hours as needed for moderate pain.   memantine (NAMENDA) 10 MG tablet Take 10 mg by mouth every morning.   metoprolol tartrate (LOPRESSOR) 25 MG tablet Take 1 tablet (25 mg total) by mouth 2 (two) times daily.   Multiple Vitamins-Minerals (MULTIVITAMIN WITH MINERALS) tablet Take 1 tablet by mouth daily.   polyethylene glycol (MIRALAX / GLYCOLAX) 17 g packet Take 17 g by mouth daily.   potassium chloride (KLOR-CON) 10 MEQ tablet Take 10 mEq by mouth 2 (two) times daily.   predniSONE (DELTASONE) 10 MG tablet Take 2 tablets (20 mg total) by mouth 2 (two) times daily.   sodium chloride 1 g tablet Take 1 tablet (1 g total) by mouth 2 (two) times daily with a meal. (Patient taking differently: Take 1 g by mouth daily.)   traZODone (DESYREL) 50 MG tablet Take 50 mg by mouth at bedtime.   No facility-administered encounter medications on file as of 12/16/2022.    Allergies (verified) Patient has no known allergies.   History: Past Medical History:  Diagnosis Date   A-fib ([place])    Acute diastolic congestive heart failure, NYHA class 2 ([place]) 11/11/2020   Anemia 11/22/2017   Anxiety 11/19/2020   Arthritis    Asymptomatic PVD (peripheral vascular disease) ([place]) 12/14/2016   Suspected.  Hands hyperemic with decreased  cap refill. ? History vasospasm   COPD with acute exacerbation ([place]) 01/14/2021   Encounter to establish care 11/19/2020   FRACTURE, TIBIAL PLATEAU 05/11/2010   Qualifier: Diagnosis of  By: Aline Brochure MD, [place]     Lung cancer, primary, with metastasis from lung to other site [facility]) 01/16/2018   Metastatic to the liver   Nuclear sclerotic cataract of both eyes 06/08/2018   Osteopenia 05/15/2013   Osteoporosis    osteopenia   Tobacco abuse 12/14/2016   Past Surgical History:  Procedure Laterality Date   FRACTURE SURGERY     torn ACL   KNEE ARTHROSCOPY     Family History  Problem Relation Age of Onset   Cancer Father        died in 42's everywhere   Heart disease Mother     Cancer Mother        lung   Stroke Mother    Asthma Daughter    Social History   Socioeconomic History   Marital status: Married    Spouse name: Smith Mcnicholas   Number of children: 1   Years of education: 15   Highest education level: Associate degree: occupational, Hotel manager, or vocational program  Occupational History   Occupation: Air traffic controller    Comment: retired  Tobacco Use   Smoking status: Every Day    Packs/day: 0.50    Years: 20.00    Additional pack years: 0.00    Total pack years: 10.00    Types: Cigarettes    Start date: 09/28/1963    Passive exposure: Current   Smokeless tobacco: Never   Tobacco comments:    Smoking Cessation Offered.  Vaping Use   Vaping Use: Never used  Substance and Sexual Activity   Alcohol use: Yes    Alcohol/week: 20.0 standard drinks of alcohol    Types: 20 Cans of beer per week    Comment: 3-4 beers a day   Drug use: No   Sexual activity: Yes    Partners: Male    Birth control/protection: Post-menopausal  Other Topics Concern   Not on file  Social History Narrative   Retired Air traffic controller   Lives with husband [name]   Daughter Marcelino Scot is in [place] to walk   Social Determinants of Health   Financial Resource Strain: Low Risk  (06/15/2022)   Overall Financial Resource Strain (CARDIA)    Difficulty of Paying Living Expenses: Not hard at all  Food Insecurity: No Food Insecurity (06/15/2022)   Hunger Vital Sign    Worried About Running Out of Food in the Last Year: Never true    [place] in the Last Year: Never true  Transportation Needs: No Transportation Needs (06/15/2022)   PRAPARE - Hydrologist (Medical): No    Lack of Transportation (Non-Medical): No  Physical Activity: Inactive (06/15/2022)   Exercise Vital Sign    Days of Exercise per Week: 0 days    Minutes of Exercise per Session: 0 min  Stress: No Stress Concern Present (06/15/2022)   [place]    Feeling of Stress : Only a little  Social Connections: Moderately Isolated (06/15/2022)   Social Connection and Isolation Panel [NHANES]    Frequency of Communication with Friends and Family: More than three times a week    Frequency of Social Gatherings with Friends and Family: More than three times a week    Attends Religious  Services: Never    Marine scientist or Organizations: No    Attends Archivist Meetings: Never    Marital Status: Married    Tobacco Counseling Ready to quit: Not Answered Counseling given: Not Answered Tobacco comments: Smoking Cessation Offered.   Clinical Intake:                 Diabetic?No         Activities of Daily Living    06/15/2022    1:26 PM 02/27/2022    5:00 AM  In your present state of health, do you have any difficulty performing the following activities:  Hearing? 0 0  Vision? 0 0  Difficulty concentrating or making decisions? 1 1  Comment Alzheimer's/Dementia   Walking or climbing stairs? 1 1  Comment Alzheimer's/Dementia   Dressing or bathing? 1 1  Comment Alzheimer's/Dementia   Doing errands, shopping? 1 0  Comment Alzheimer's/Dementia   Preparing Food and eating ? Y   Comment Husband Assists   Using the Toilet? Y   Comment Husband Assists   In the past six months, have you accidently leaked urine? Y   Comment Husband Assists   Do you have problems with loss of bowel control? N   Managing your Medications? Y   Comment Husband Assists   Managing your Finances? Y   Comment Husband Assists   Housekeeping or managing your Housekeeping? Y   Comment Husband Assists     Patient Care Team: Lindell Spar, MD as PCP - General (Internal Medicine)  Indicate any recent Medical Services you may have received from other than [facility] providers in the past year (date may be approximate).     Assessment:   This is a routine wellness examination for  [place].  Hearing/Vision screen No results found.  Dietary issues and exercise activities discussed:     Goals Addressed   None   Depression Screen    06/15/2022    1:23 PM 05/30/2021   10:40 AM 05/30/2021   10:36 AM 11/19/2020    8:19 AM 07/19/2018   10:38 AM 07/29/2017    2:42 PM 06/15/2017   11:47 AM  PHQ 2/9 Scores  PHQ - 2 Score 0 0 0 0 3 0 0  PHQ- 9 Score     5    Exception Documentation Other- indicate reason in comment box        Not completed Alzheimer's/Dementia          Fall Risk    06/15/2022    1:26 PM 05/30/2021   10:40 AM 11/19/2020    8:19 AM 12/14/2016    1:06 PM  Fall Risk   Falls in the past year? 1 0 0 No  Number falls in past yr: 1 0 0   Injury with Fall? 1 0 0   Risk for fall due to : History of fall(s);Impaired balance/gait;Impaired mobility;Mental status change No Fall Risks No Fall Risks   Follow up Falls evaluation completed;Education provided;Falls prevention discussed Falls evaluation completed Falls evaluation completed     FALL RISK PREVENTION PERTAINING TO THE HOME:  Any stairs in or around the home? No  If so, are there any without handrails? No  Home free of loose throw rugs in walkways, pet beds, electrical cords, etc? Yes  Adequate lighting in your home to reduce risk of falls? Yes   ASSISTIVE DEVICES UTILIZED TO PREVENT FALLS:  Life alert? No  Use of a cane, walker or w/c? No  Grab bars in the bathroom? Yes  Shower chair or bench in shower? No  Elevated toilet seat or a handicapped toilet? No   TIMED UP AND GO:  Was the test performed? No .  Length of time to ambulate 10 feet:  sec.     Cognitive Function:        05/30/2021   10:42 AM  6CIT Screen  What Year? 0 points  What month? 0 points  What time? 0 points  Count back from 20 0 points  Months in reverse 4 points  Repeat phrase 0 points  Total Score 4 points    Immunizations Immunization History  Administered Date(s) Administered   Influenza, High Dose Seasonal PF  07/10/2020   Influenza, Quadrivalent, Recombinant, Inj, Pf 10/20/2017   Influenza,inj,Quad PF,6-35 Mos 07/06/2018, 06/28/2019   PFIZER(Purple Top)SARS-COV-2 Vaccination 11/20/2019, 12/11/2019, 05/25/2020    TDAP status: Due, Education has been provided regarding the importance of this vaccine. Advised may receive this vaccine at local pharmacy or Health Dept. Aware to provide a copy of the vaccination record if obtained from local pharmacy or Health Dept. Verbalized acceptance and understanding.  Flu Vaccine status: Up to date  Pneumococcal vaccine status: Due, Education has been provided regarding the importance of this vaccine. Advised may receive this vaccine at local pharmacy or Health Dept. Aware to provide a copy of the vaccination record if obtained from local pharmacy or Health Dept. Verbalized acceptance and understanding.  Covid-19 vaccine status: Declined, Education has been provided regarding the importance of this vaccine but patient still declined. Advised may receive this vaccine at local pharmacy or Health Dept.or vaccine clinic. Aware to provide a copy of the vaccination record if obtained from local pharmacy or Health Dept. Verbalized acceptance and understanding.  Qualifies for Shingles Vaccine? Yes   Zostavax completed No   Shingrix Completed?: No.    Education has been provided regarding the importance of this vaccine. Patient has been advised to call insurance company to determine out of pocket expense if they have not yet received this vaccine. Advised may also receive vaccine at local pharmacy or Health Dept. Verbalized acceptance and understanding.  Screening Tests Health Maintenance  Topic Date Due   Pneumonia Vaccine 30+ Years old (1 of 2 - PCV) Never done   DTaP/Tdap/Td (1 - Tdap) Never done   Zoster Vaccines- Shingrix (1 of 2) Never done   COLONOSCOPY (Pts 45-57yrs Insurance coverage will need to be confirmed)  Never done   MAMMOGRAM  03/25/2018   INFLUENZA  VACCINE  04/27/2022   COVID-19 Vaccine (4 - 2023-24 season) 05/28/2022   Medicare Annual Wellness (AWV)  12/16/2023   DEXA SCAN  Completed   Hepatitis C Screening  Completed   HPV VACCINES  Aged Out    Health Maintenance  Health Maintenance Due  Topic Date Due   Pneumonia Vaccine 37+ Years old (1 of 2 - PCV) Never done   DTaP/Tdap/Td (1 - Tdap) Never done   Zoster Vaccines- Shingrix (1 of 2) Never done   COLONOSCOPY (Pts 45-53yrs Insurance coverage will need to be confirmed)  Never done   MAMMOGRAM  03/25/2018   INFLUENZA VACCINE  04/27/2022   COVID-19 Vaccine (4 - 2023-24 season) 05/28/2022          Lung Cancer Screening: (Low Dose CT Chest recommended if Age 22-80 years, 30 pack-year currently smoking OR have quit w/in 15years.) does not qualify.   Lung Cancer Screening Referral:   Additional Screening:  Hepatitis C Screening:  does qualify; Completed   Vision Screening: Recommended annual ophthalmology exams for early detection of glaucoma and other disorders of the eye. Is the patient up to date with their annual eye exam?  No  Who is the provider or what is the name of the office in which the patient attends annual eye exams?  If pt is not established with a provider, would they like to be referred to a provider to establish care? No .   Dental Screening: Recommended annual dental exams for proper oral hygiene  Community Resource Referral / Chronic Care Management: CRR required this visit?  No   CCM required this visit?  No      Plan:     I have personally reviewed and noted the following in the patient's chart:   Medical and social history Use of alcohol, tobacco or illicit drugs  Current medications and supplements including opioid prescriptions. Patient is currently taking opioid prescriptions. Information provided to patient regarding non-opioid alternatives. Patient advised to discuss non-opioid treatment plan with their provider. Functional ability  and status Nutritional status Physical activity Advanced directives List of other physicians Hospitalizations, surgeries, and ER visits in previous 12 months Vitals Screenings to include cognitive, depression, and falls Referrals and appointments  In addition, I have reviewed and discussed with patient certain preventive protocols, quality metrics, and best practice recommendations. A written personalized care plan for preventive services as well as general preventive health recommendations were provided to patient.     Orson Ape, [place]   12/16/2022   Nurse Notes:  Ms. Morasch , Thank you for taking time to come for your Medicare Wellness Visit. I appreciate your ongoing commitment to your health goals. Please review the following plan we discussed and let me know if I can assist you in the future.   These are the goals we discussed:  Goals      Blood Pressure < 140/90     Quit Smoking     Quit Smoking     Quit smoking / using tobacco        This is a list of the screening recommended for you and due dates:  Health Maintenance  Topic Date Due   Pneumonia Vaccine (1 of 2 - PCV) Never done   DTaP/Tdap/Td vaccine (1 - Tdap) Never done   Zoster (Shingles) Vaccine (1 of 2) Never done   Colon Cancer Screening  Never done   Mammogram  03/25/2018   Flu Shot  04/27/2022   COVID-19 Vaccine (4 - 2023-24 season) 05/28/2022   Medicare Annual Wellness Visit  12/16/2023   DEXA scan (bone density measurement)  Completed   Hepatitis C Screening: USPSTF Recommendation to screen - Ages 18-79 yo.  Completed   HPV Vaccine  Aged Out        I have reviewed and agree with the above AWV documentation.  Ihor Dow, MD [place]

## 2023-02-08 DIAGNOSIS — I340 Nonrheumatic mitral (valve) insufficiency: Secondary | ICD-10-CM | POA: Diagnosis not present

## 2023-02-08 DIAGNOSIS — E871 Hypo-osmolality and hyponatremia: Secondary | ICD-10-CM | POA: Diagnosis not present

## 2023-02-08 DIAGNOSIS — R001 Bradycardia, unspecified: Secondary | ICD-10-CM | POA: Diagnosis not present

## 2023-02-08 DIAGNOSIS — I5032 Chronic diastolic (congestive) heart failure: Secondary | ICD-10-CM | POA: Diagnosis not present

## 2023-02-08 DIAGNOSIS — I480 Paroxysmal atrial fibrillation: Secondary | ICD-10-CM | POA: Diagnosis not present

## 2023-02-08 DIAGNOSIS — I482 Chronic atrial fibrillation, unspecified: Secondary | ICD-10-CM | POA: Diagnosis not present

## 2023-02-11 DIAGNOSIS — I482 Chronic atrial fibrillation, unspecified: Secondary | ICD-10-CM | POA: Diagnosis not present

## 2023-03-01 DIAGNOSIS — I272 Pulmonary hypertension, unspecified: Secondary | ICD-10-CM | POA: Diagnosis not present

## 2023-03-01 DIAGNOSIS — I081 Rheumatic disorders of both mitral and tricuspid valves: Secondary | ICD-10-CM | POA: Diagnosis not present

## 2023-03-19 ENCOUNTER — Ambulatory Visit
Admission: EM | Admit: 2023-03-19 | Discharge: 2023-03-19 | Disposition: A | Discharge status: Home / Self Care | Payer: Medicare Other | Attending: Nurse Practitioner | Admitting: Nurse Practitioner

## 2023-03-19 DIAGNOSIS — R39198 Other difficulties with micturition: Secondary | ICD-10-CM | POA: Insufficient documentation

## 2023-03-19 DIAGNOSIS — R3915 Urgency of urination: Secondary | ICD-10-CM | POA: Insufficient documentation

## 2023-03-19 LAB — POCT URINALYSIS DIP (MANUAL ENTRY)
Bilirubin, UA: NEGATIVE
Glucose, UA: NEGATIVE mg/dL
Ketones, POC UA: NEGATIVE mg/dL
Nitrite, UA: NEGATIVE
Protein Ur, POC: 100 mg/dL — AB
Spec Grav, UA: 1.015 (ref 1.010–1.025)
Urobilinogen, UA: 0.2 E.U./dL
pH, UA: 7 (ref 5.0–8.0)

## 2023-03-19 NOTE — Discharge Instructions (Signed)
The urinalysis does not indicate a clear urinary tract infection.  A urine culture is pending.  If the result of the culture is positive, you will be contacted to provide treatment.  Results will also be available via MyChart within the next 24 to 72 hours. Recommend increasing your water intake.  Recommend drinking at least 6-8 8 ounce glasses of water daily. May take over-the-counter Tylenol for pain or discomfort. As discussed, if the culture result is negative, please follow-up with your primary care physician to discuss possible referral to urology. Follow-up as needed.

## 2023-03-19 NOTE — ED Provider Notes (Signed)
[facility]    CSN: [id number] Arrival date & time: 03/19/23  0941      History   Chief Complaint No chief complaint on file.   HPI Cynthia Kelley is a 67 y.o. female.   The history is provided by the patient and the spouse.   The patient presents with her spouse for increased urgency and decreased urine stream.  Patient's husband states symptoms have been present for more than 1 year.  Patient and spouse deny fever, chills, pain with urination, frequency, hematuria, flank pain, or low back pain.  The patient spouse states that the patient does have a history of cancer, and is currently on chemotherapy.  He reports that the patient does experience "brain fog and has difficulty with her short-term memory.  He also states that the patient drinks sodas such as [employer], beer, and "energy" drinks, more so than drinking water.  The patient's spouse states that they have not discussed this with her PCP, but have talked to the patient's cardiologist and oncologist regarding this issue.  Past Medical History:  Diagnosis Date   A-fib [facility])    Acute diastolic congestive heart failure, NYHA class 2 (HCC) 11/11/2020   Anemia 11/22/2017   Anxiety 11/19/2020   Arthritis    Asymptomatic PVD (peripheral vascular disease) (HCC) 12/14/2016   Suspected.  Hands hyperemic with decreased cap refill. ? History vasospasm   COPD with acute exacerbation (HCC) 01/14/2021   Encounter to establish care 11/19/2020   FRACTURE, TIBIAL PLATEAU 05/11/2010   Qualifier: Diagnosis of  By: Romeo Apple MD, [place]     Lung cancer, primary, with metastasis from lung to other site [facility]) 01/16/2018   Metastatic to the liver   Nuclear sclerotic cataract of both eyes 06/08/2018   Osteopenia 05/15/2013   Osteoporosis    osteopenia   Tobacco abuse 12/14/2016    Patient Active Problem List   Diagnosis Date Noted   Elevated liver enzymes    Chronic alcohol abuse    Long term current use of high dose  acetaminophen    Humeral surgical neck fracture 02/27/2022   Fall at home, initial encounter 02/27/2022   Transaminitis 02/27/2022   Dementia without behavioral disturbance (HCC) 02/27/2022   Hypomagnesemia 02/27/2022   Paroxysmal atrial fibrillation (HCC) 02/27/2022   Memory loss 08/11/2021   Hypokalemia 03/03/2021   Leukocytosis 03/03/2021   Anemia 03/03/2021   Thrombocytopenia (HCC) 03/03/2021   Prolonged QT interval 03/03/2021   Dehydration 03/03/2021   COPD with acute exacerbation (HCC) 01/14/2021   Acute on chronic diastolic CHF (congestive heart failure) (HCC) 01/14/2021   Acute respiratory failure with hypoxia (HCC) 01/12/2021   Community acquired pneumonia 01/12/2021   Hyponatremia 01/12/2021   Alcohol dependence (HCC) 01/12/2021   Encounter to establish care 11/19/2020   Anxiety 11/19/2020   Chronic diastolic CHF (congestive heart failure) (HCC) 11/11/2020   Typical atrial flutter (HCC) 11/11/2020   Oropharyngeal dysphagia 08/13/2020   Screening for colorectal cancer 07/19/2018   Dry eye syndrome of both eyes 06/08/2018   Nuclear sclerotic cataract of both eyes 06/08/2018   Posterior vitreous detachment of both eyes 06/08/2018   Lung cancer, primary, with metastasis from lung to other site [facility]) 01/16/2018   Osseous metastasis 11/10/2017   Metastatic squamous cell carcinoma 10/20/2017   Tobacco abuse 12/14/2016   Osteopenia 05/15/2013   KNEE, ARTHRITIS, DEGEN./OSTEO 05/11/2010    Past Surgical History:  Procedure Laterality Date   FRACTURE SURGERY     torn ACL   KNEE  ARTHROSCOPY      OB History     Gravida  1   Para  1   Term      Preterm      AB      Living  1      SAB      IAB      Ectopic      Multiple      Live Births  1            Home Medications    Prior to Admission medications   Medication Sig Start Date End Date Taking? Authorizing Provider  amiodarone (PACERONE) 200 MG tablet Take 200 mg by mouth daily. 02/06/21    [provider]  apixaban (ELIQUIS) 5 MG TABS tablet Take 5 mg by mouth 2 (two) times daily. 09/27/20   [provider]  doxycycline (VIBRAMYCIN) 100 MG capsule Take 1 capsule (100 mg total) by mouth 2 (two) times daily. One po bid x 7 days 09/05/22   Geoffery Lyons, MD  furosemide (LASIX) 20 MG tablet Take 1 tablet (20 mg total) by mouth daily as needed for fluid. 03/04/22   Erick Blinks, MD  HYDROcodone-acetaminophen (NORCO/VICODIN) 5-325 MG tablet Take 1 tablet by mouth every 6 (six) hours as needed for moderate pain. 09/07/22   Oliver Barre, MD  memantine (NAMENDA) 10 MG tablet Take 10 mg by mouth every morning. 02/05/21   [provider]  metoprolol tartrate (LOPRESSOR) 25 MG tablet Take 1 tablet (25 mg total) by mouth 2 (two) times daily. 03/04/22   Erick Blinks, MD  Multiple Vitamins-Minerals (MULTIVITAMIN WITH MINERALS) tablet Take 1 tablet by mouth daily.    [provider]  polyethylene glycol (MIRALAX / GLYCOLAX) 17 g packet Take 17 g by mouth daily. 03/05/22   Erick Blinks, MD  potassium chloride (KLOR-CON) 10 MEQ tablet Take 10 mEq by mouth 2 (two) times daily. 10/05/21   [provider]  predniSONE (DELTASONE) 10 MG tablet Take 2 tablets (20 mg total) by mouth 2 (two) times daily. 09/05/22   Geoffery Lyons, MD  sodium chloride 1 g tablet Take 1 tablet (1 g total) by mouth 2 (two) times daily with a meal. Patient taking differently: Take 1 g by mouth daily. 03/06/21   Vassie Loll, MD  traZODone (DESYREL) 50 MG tablet Take 50 mg by mouth at bedtime. 02/12/22   [provider]    Family History Family History  Problem Relation Age of Onset   Cancer Father        died in 53's everywhere   Heart disease Mother    Cancer Mother        lung   Stroke Mother    Asthma Daughter     Social History Social History   Tobacco Use   Smoking status: Every Day    Packs/day: 0.50    Years: 20.00    Additional pack years: 0.00     Total pack years: 10.00    Types: Cigarettes    Start date: 09/28/1963    Passive exposure: Current   Smokeless tobacco: Never   Tobacco comments:    Smoking Cessation Offered.  Vaping Use   Vaping Use: Never used  Substance Use Topics   Alcohol use: Yes    Alcohol/week: 20.0 standard drinks of alcohol    Types: 20 Cans of beer per week    Comment: 3-4 beers a day   Drug use: No     Allergies  Patient has no known allergies.   Review of Systems Review of Systems Per HPI  Physical Exam Triage Vital Signs ED Triage Vitals  Enc Vitals Group     BP 03/19/23 1045 135/83     Pulse Rate 03/19/23 1045 (!) 102     Resp 03/19/23 1045 13     Temp 03/19/23 1045 98.6 F (37 C)     Temp Source 03/19/23 1045 Oral     SpO2 03/19/23 1045 96 %     Weight --      Height --      Head Circumference --      Peak Flow --      Pain Score 03/19/23 1040 0     Pain Loc --      Pain Edu? --      Excl. in GC? --    No data found.  Updated Vital Signs BP 135/83 (BP Location: Right Arm)   Pulse (!) 102   Temp 98.6 F (37 C) (Oral)   Resp 13   SpO2 96%   Visual Acuity Right Eye Distance:   Left Eye Distance:   Bilateral Distance:    Right Eye Near:   Left Eye Near:    Bilateral Near:     Physical Exam Vitals and nursing note reviewed.  Constitutional:      General: She is not in acute distress.    Appearance: Normal appearance.  HENT:     Head: Normocephalic.  Eyes:     Extraocular Movements: Extraocular movements intact.     Conjunctiva/sclera: Conjunctivae normal.     Pupils: Pupils are equal, round, and reactive to light.  Cardiovascular:     Rate and Rhythm: Normal rate and regular rhythm.     Pulses: Normal pulses.     Heart sounds: Normal heart sounds.  Pulmonary:     Effort: Pulmonary effort is normal.     Breath sounds: Normal breath sounds.  Abdominal:     General: Bowel sounds are normal.     Palpations: Abdomen is soft.     Tenderness: There is no right  CVA tenderness or left CVA tenderness.  Musculoskeletal:     Cervical back: Normal range of motion.  Skin:    General: Skin is warm and dry.  Neurological:     General: No focal deficit present.     Mental Status: She is alert and oriented to person, place, and time.  Psychiatric:        Mood and Affect: Mood normal.        Behavior: Behavior normal.     UC Treatments / Results  Labs (all labs ordered are listed, but only abnormal results are displayed) Labs Reviewed  POCT URINALYSIS DIP (MANUAL ENTRY) - Abnormal; Notable for the following components:      Result Value   Clarity, UA cloudy (*)    Blood, UA trace-lysed (*)    Protein Ur, POC =100 (*)    Leukocytes, UA Trace (*)    All other components within normal limits    EKG   Radiology No results found.  Procedures Procedures (including critical care time)  Medications Ordered in UC Medications - No data to display  Initial Impression / Assessment and Plan / UC Course  I have reviewed the triage vital signs and the nursing notes.  Pertinent labs & imaging results that were available during my care of the patient were reviewed by me and considered in my medical decision making (  see chart for details).  The patient is well-appearing, she is in no acute distress, vital signs are stable.  Urinalysis does not indicate a clear urinary tract infection.  Urine culture is pending.  Discussion with the patient and her spouse and advised that if the culture is positive, treatment will be provided.  Also advised that if the culture is negative, the best next steps would be to follow-up with the patient's primary care physician to discuss possible referral to urology.  Further care recommendations were provided to the patient to include increasing her water intake, recommending at least 6-8 8 ounce glasses of water daily, and over-the-counter analgesics for pain or discomfort.  Patient and her spouse are in agreement with this  plan of care and verbalized understanding.  All questions were answered.  Patient stable for discharge.   Final Clinical Impressions(s) / UC Diagnoses   Final diagnoses:  Urinary urgency  Decreased urine stream     Discharge Instructions      The urinalysis does not indicate a clear urinary tract infection.  A urine culture is pending.  If the result of the culture is positive, you will be contacted to provide treatment.  Results will also be available via MyChart within the next 24 to 72 hours. Recommend increasing your water intake.  Recommend drinking at least 6-8 8 ounce glasses of water daily. May take over-the-counter Tylenol for pain or discomfort. As discussed, if the culture result is negative, please follow-up with your primary care physician to discuss possible referral to urology. Follow-up as needed.     ED Prescriptions   None    PDMP not reviewed this encounter.   Abran Cantor, NP 03/19/23 1122

## 2023-03-19 NOTE — ED Triage Notes (Signed)
Pt c/o UTI sx's, husband states she has urgency and cannot produce a lot  does not have pain, its been a few months.

## 2023-03-20 LAB — URINE CULTURE

## 2023-05-29 ENCOUNTER — Emergency Department (HOSPITAL_COMMUNITY)
Admission: EM | Admit: 2023-05-29 | Discharge: 2023-05-29 | Disposition: A | Discharge status: Home / Self Care | Payer: Medicare Other | Attending: Emergency Medicine | Admitting: Emergency Medicine

## 2023-05-29 ENCOUNTER — Ambulatory Visit
Admission: EM | Admit: 2023-05-29 | Discharge: 2023-05-29 | Disposition: A | Discharge status: Home / Self Care | Payer: Medicare Other

## 2023-05-29 ENCOUNTER — Other Ambulatory Visit: Payer: Self-pay

## 2023-05-29 ENCOUNTER — Emergency Department (HOSPITAL_COMMUNITY): Payer: Medicare Other

## 2023-05-29 ENCOUNTER — Encounter (HOSPITAL_COMMUNITY): Payer: Self-pay | Admitting: *Deleted

## 2023-05-29 DIAGNOSIS — F0390 Unspecified dementia without behavioral disturbance: Secondary | ICD-10-CM | POA: Diagnosis not present

## 2023-05-29 DIAGNOSIS — M7989 Other specified soft tissue disorders: Secondary | ICD-10-CM | POA: Diagnosis not present

## 2023-05-29 DIAGNOSIS — R2240 Localized swelling, mass and lump, unspecified lower limb: Secondary | ICD-10-CM | POA: Diagnosis not present

## 2023-05-29 DIAGNOSIS — R600 Localized edema: Secondary | ICD-10-CM

## 2023-05-29 DIAGNOSIS — M19071 Primary osteoarthritis, right ankle and foot: Secondary | ICD-10-CM | POA: Diagnosis not present

## 2023-05-29 LAB — HEPATIC FUNCTION PANEL
ALT: 34 U/L (ref 0–44)
AST: 28 U/L (ref 15–41)
Albumin: 3.1 g/dL — ABNORMAL LOW (ref 3.5–5.0)
Alkaline Phosphatase: 78 U/L (ref 38–126)
Bilirubin, Direct: 0.4 mg/dL — ABNORMAL HIGH (ref 0.0–0.2)
Indirect Bilirubin: 0.9 mg/dL (ref 0.3–0.9)
Total Bilirubin: 1.3 mg/dL — ABNORMAL HIGH (ref 0.3–1.2)
Total Protein: 6.2 g/dL — ABNORMAL LOW (ref 6.5–8.1)

## 2023-05-29 LAB — CBC WITH DIFFERENTIAL/PLATELET
Abs Immature Granulocytes: 0.07 10*3/uL (ref 0.00–0.07)
Basophils Absolute: 0.1 10*3/uL (ref 0.0–0.1)
Basophils Relative: 1 %
Eosinophils Absolute: 0.1 10*3/uL (ref 0.0–0.5)
Eosinophils Relative: 1 %
HCT: 40.5 % (ref 36.0–46.0)
Hemoglobin: 13.8 g/dL (ref 12.0–15.0)
Immature Granulocytes: 1 %
Lymphocytes Relative: 12 %
Lymphs Abs: 1.2 10*3/uL (ref 0.7–4.0)
MCH: 33 pg (ref 26.0–34.0)
MCHC: 34.1 g/dL (ref 30.0–36.0)
MCV: 96.9 fL (ref 80.0–100.0)
Monocytes Absolute: 0.7 10*3/uL (ref 0.1–1.0)
Monocytes Relative: 7 %
Neutro Abs: 7.5 10*3/uL (ref 1.7–7.7)
Neutrophils Relative %: 78 %
Platelets: 439 10*3/uL — ABNORMAL HIGH (ref 150–400)
RBC: 4.18 MIL/uL (ref 3.87–5.11)
RDW: 13.5 % (ref 11.5–15.5)
WBC: 9.6 10*3/uL (ref 4.0–10.5)
nRBC: 0 % (ref 0.0–0.2)

## 2023-05-29 LAB — BASIC METABOLIC PANEL
Anion gap: 10 (ref 5–15)
BUN: 12 mg/dL (ref 8–23)
CO2: 23 mmol/L (ref 22–32)
Calcium: 8.7 mg/dL — ABNORMAL LOW (ref 8.9–10.3)
Chloride: 96 mmol/L — ABNORMAL LOW (ref 98–111)
Creatinine, Ser: 0.39 mg/dL — ABNORMAL LOW (ref 0.44–1.00)
GFR, Estimated: >60 mL/min (ref >60)
Glucose, Bld: 91 mg/dL (ref 70–99)
Potassium: 4.1 mmol/L (ref 3.5–5.1)
Sodium: 129 mmol/L — ABNORMAL LOW (ref 135–145)

## 2023-05-29 LAB — BRAIN NATRIURETIC PEPTIDE: B Natriuretic Peptide: 942 pg/mL — ABNORMAL HIGH (ref 0.0–100.0)

## 2023-05-29 MED ORDER — CEPHALEXIN 500 MG PO CAPS: 500.0000 mg | ORAL_CAPSULE | Freq: Four times a day (QID) | ORAL | 0 refills | Status: DC

## 2023-05-29 MED ORDER — FUROSEMIDE 20 MG PO TABS: 20.0000 mg | ORAL_TABLET | Freq: Every day | ORAL | 0 refills | Status: DC

## 2023-05-29 NOTE — ED Provider Notes (Signed)
[facility]    CSN: [id number] Arrival date & time: 05/29/23  0858      History   Chief Complaint Chief Complaint  Patient presents with   Leg Swelling    HPI Cynthia Kelley is a 67 y.o. female.   The history is provided by the spouse.   Patient brought in by her husband for complaints of right lower extremity swelling.  Patient's husband states symptoms worsened over the past 3 days.  Patient has swelling in both lower extremities, but significantly worse on the right side.  Patient denies fever, chills, chest pain, shortness of breath, difficulty breathing, abdominal pain, nausea, vomiting, or diarrhea.  Patient's husband reports patient has been on Lasix in the past.  He states she is currently not on that medication.  She does take Eliquis.  Patient does have a significant past medical history to include A-fib, CHF, COPD, and lung cancer.  Past Medical History:  Diagnosis Date   A-fib [facility])    Acute diastolic congestive heart failure, NYHA class 2 (HCC) 11/11/2020   Anemia 11/22/2017   Anxiety 11/19/2020   Arthritis    Asymptomatic PVD (peripheral vascular disease) (HCC) 12/14/2016   Suspected.  Hands hyperemic with decreased cap refill. ? History vasospasm   COPD with acute exacerbation (HCC) 01/14/2021   Encounter to establish care 11/19/2020   FRACTURE, TIBIAL PLATEAU 05/11/2010   Qualifier: Diagnosis of  By: Romeo Apple MD, [place]     Lung cancer, primary, with metastasis from lung to other site [facility]) 01/16/2018   Metastatic to the liver   Nuclear sclerotic cataract of both eyes 06/08/2018   Osteopenia 05/15/2013   Osteoporosis    osteopenia   Tobacco abuse 12/14/2016    Patient Active Problem List   Diagnosis Date Noted   Elevated liver enzymes    Chronic alcohol abuse    Long term current use of high dose acetaminophen    Humeral surgical neck fracture 02/27/2022   Fall at home, initial encounter 02/27/2022   Transaminitis 02/27/2022   Dementia  without behavioral disturbance (HCC) 02/27/2022   Hypomagnesemia 02/27/2022   Paroxysmal atrial fibrillation (HCC) 02/27/2022   Memory loss 08/11/2021   Hypokalemia 03/03/2021   Leukocytosis 03/03/2021   Anemia 03/03/2021   Thrombocytopenia (HCC) 03/03/2021   Prolonged QT interval 03/03/2021   Dehydration 03/03/2021   COPD with acute exacerbation (HCC) 01/14/2021   Acute on chronic diastolic CHF (congestive heart failure) (HCC) 01/14/2021   Acute respiratory failure with hypoxia (HCC) 01/12/2021   Community acquired pneumonia 01/12/2021   Hyponatremia 01/12/2021   Alcohol dependence (HCC) 01/12/2021   Encounter to establish care 11/19/2020   Anxiety 11/19/2020   Chronic diastolic CHF (congestive heart failure) (HCC) 11/11/2020   Typical atrial flutter (HCC) 11/11/2020   Oropharyngeal dysphagia 08/13/2020   Screening for colorectal cancer 07/19/2018   Dry eye syndrome of both eyes 06/08/2018   Nuclear sclerotic cataract of both eyes 06/08/2018   Posterior vitreous detachment of both eyes 06/08/2018   Lung cancer, primary, with metastasis from lung to other site [facility]) 01/16/2018   Osseous metastasis 11/10/2017   Metastatic squamous cell carcinoma 10/20/2017   Tobacco abuse 12/14/2016   Osteopenia 05/15/2013   KNEE, ARTHRITIS, DEGEN./OSTEO 05/11/2010    Past Surgical History:  Procedure Laterality Date   FRACTURE SURGERY     torn ACL   KNEE ARTHROSCOPY      OB History     Gravida  1   Para  1   Term  Preterm      AB      Living  1      SAB      IAB      Ectopic      Multiple      Live Births  1            Home Medications    Prior to Admission medications   Medication Sig Start Date End Date Taking? Authorizing Provider  amiodarone (PACERONE) 200 MG tablet Take 200 mg by mouth daily. 02/06/21   [provider]  apixaban (ELIQUIS) 5 MG TABS tablet Take 5 mg by mouth 2 (two) times daily. 09/27/20   [provider]   doxycycline (VIBRAMYCIN) 100 MG capsule Take 1 capsule (100 mg total) by mouth 2 (two) times daily. One po bid x 7 days 09/05/22   Geoffery Lyons, MD  furosemide (LASIX) 20 MG tablet Take 1 tablet (20 mg total) by mouth daily as needed for fluid. 03/04/22   Erick Blinks, MD  HYDROcodone-acetaminophen (NORCO/VICODIN) 5-325 MG tablet Take 1 tablet by mouth every 6 (six) hours as needed for moderate pain. 09/07/22   Oliver Barre, MD  memantine (NAMENDA) 10 MG tablet Take 10 mg by mouth every morning. 02/05/21   [provider]  metoprolol tartrate (LOPRESSOR) 25 MG tablet Take 1 tablet (25 mg total) by mouth 2 (two) times daily. 03/04/22   Erick Blinks, MD  Multiple Vitamins-Minerals (MULTIVITAMIN WITH MINERALS) tablet Take 1 tablet by mouth daily.    [provider]  polyethylene glycol (MIRALAX / GLYCOLAX) 17 g packet Take 17 g by mouth daily. 03/05/22   Erick Blinks, MD  potassium chloride (KLOR-CON) 10 MEQ tablet Take 10 mEq by mouth 2 (two) times daily. 10/05/21   [provider]  predniSONE (DELTASONE) 10 MG tablet Take 2 tablets (20 mg total) by mouth 2 (two) times daily. 09/05/22   Geoffery Lyons, MD  sodium chloride 1 g tablet Take 1 tablet (1 g total) by mouth 2 (two) times daily with a meal. Patient taking differently: Take 1 g by mouth daily. 03/06/21   Vassie Loll, MD  traZODone (DESYREL) 50 MG tablet Take 50 mg by mouth at bedtime. 02/12/22   [provider]    Family History Family History  Problem Relation Age of Onset   Cancer Father        died in 34's everywhere   Heart disease Mother    Cancer Mother        lung   Stroke Mother    Asthma Daughter     Social History Social History   Tobacco Use   Smoking status: Every Day    Current packs/day: 0.50    Average packs/day: 0.5 packs/day for 59.7 years (29.8 ttl pk-yrs)    Types: Cigarettes    Start date: 09/28/1963    Passive exposure: Current   Smokeless tobacco: Never   Tobacco  comments:    Smoking Cessation Offered.  Vaping Use   Vaping status: Never Used  Substance Use Topics   Alcohol use: Yes    Alcohol/week: 20.0 standard drinks of alcohol    Types: 20 Cans of beer per week    Comment: 3-4 beers a day   Drug use: No     Allergies   Patient has no known allergies.   Review of Systems Review of Systems Per HPI  Physical Exam Triage Vital Signs ED Triage Vitals  Encounter Vitals Group  BP 05/29/23 0902 (!) 140/77     Systolic BP Percentile --      Diastolic BP Percentile --      Pulse Rate 05/29/23 0902 98     Resp 05/29/23 0902 20     Temp 05/29/23 0902 97.7 F (36.5 C)     Temp Source 05/29/23 0902 Oral     SpO2 05/29/23 0902 100 %     Weight --      Height --      Head Circumference --      Peak Flow --      Pain Score 05/29/23 0905 0     Pain Loc --      Pain Education --      Exclude from Growth Chart --    No data found.  Updated Vital Signs BP (!) 140/77 (BP Location: Right Arm)   Pulse 98   Temp 97.7 F (36.5 C) (Oral)   Resp 20   SpO2 100%   Visual Acuity Right Eye Distance:   Left Eye Distance:   Bilateral Distance:    Right Eye Near:   Left Eye Near:    Bilateral Near:     Physical Exam Vitals and nursing note reviewed.  Constitutional:      General: She is not in acute distress.    Appearance: Normal appearance.  HENT:     Head: Normocephalic.  Eyes:     Extraocular Movements: Extraocular movements intact.     Pupils: Pupils are equal, round, and reactive to light.  Cardiovascular:     Rate and Rhythm: Normal rate and regular rhythm.     Pulses: Normal pulses.     Heart sounds: Normal heart sounds.  Pulmonary:     Effort: Pulmonary effort is normal. No respiratory distress.     Breath sounds: Normal breath sounds. No stridor. No wheezing, rhonchi or rales.  Musculoskeletal:     Cervical back: Normal range of motion.     Right lower leg: Edema (Edema greater in the right lower extremity versus  the left) present.     Left lower leg: Edema present.  Skin:    General: Skin is warm and dry.  Neurological:     General: No focal deficit present.     Mental Status: She is alert and oriented to person, place, and time.  Psychiatric:        Mood and Affect: Mood normal.        Behavior: Behavior normal.      UC Treatments / Results  Labs (all labs ordered are listed, but only abnormal results are displayed) Labs Reviewed - No data to display  EKG   Radiology No results found.  Procedures Procedures (including critical care time)  Medications Ordered in UC Medications - No data to display  Initial Impression / Assessment and Plan / UC Course  I have reviewed the triage vital signs and the nursing notes.  Pertinent labs & imaging results that were available during my care of the patient were reviewed by me and considered in my medical decision making (see chart for details).  Patient brought in by her spouse for complaints of right lower extremity swelling.  On exam, patient has moderate edema in the right foot, ankle, and extending toward the mid lower right leg.  She also has edema noted in the left lower extremity, edema is greater in the right.  Given the patient's past medical history, need for lab work, and possible imaging,  it is recommended that the patient go to the emergency department for further evaluation.  Patient's spouse was advised of same and is in agreement with this plan of care.  Patient's spouse verbalizes understanding.  Patient's vitals are stable, she is able to travel via private vehicle.  Patient discharged to the emergency department.  Patient's spouse states he will take her to [facility].  Final Clinical Impressions(s) / UC Diagnoses   Final diagnoses:  Bilateral lower extremity edema     Discharge Instructions      Go to the emergency department for further evaluation.     ED Prescriptions   None    PDMP not reviewed this  encounter.   Abran Cantor, NP 05/29/23 [phone]

## 2023-05-29 NOTE — Discharge Instructions (Signed)
Go to the emergency department for further evaluation

## 2023-05-29 NOTE — Discharge Instructions (Signed)
Follow-up with your family doctor or your oncologist next week

## 2023-05-29 NOTE — ED Triage Notes (Signed)
Pt with bilateral feet swelling x 4 days. Right foot > left foot.  Swelling to right lower leg as well. Denies any SOB.

## 2023-05-29 NOTE — ED Notes (Signed)
US at bedside

## 2023-05-29 NOTE — ED Triage Notes (Addendum)
Pt reports having right ankle swelling for a few days. Patients spouse states the patient does have afib (eliquis) and that the patient is not taking lasix at the moment. Patient does not recall doing anything to cause the swelling, she denies pain to back of leg, and denies SHOB.

## 2023-05-29 NOTE — ED Notes (Signed)
Patient Alert and oriented to baseline. Stable and ambulatory to baseline. Patient verbalized understanding of the discharge instructions.  Patient belongings were taken by the patient.   

## 2023-05-29 NOTE — ED Notes (Signed)
Patient is being discharged from the Urgent Care and sent to the Emergency Department via POV . Per Niel Hummer NP, patient is in need of higher level of care due to need for further evaluation. Patient is aware and verbalizes understanding of plan of care.  Vitals:   05/29/23 0902  BP: (!) 140/77  Pulse: 98  Resp: 20  Temp: 97.7 F (36.5 C)  SpO2: 100%

## 2023-05-31 NOTE — ED Provider Notes (Signed)
[facility]   CSN: [id number] Arrival date & time: 05/29/23  1003     History  Chief Complaint  Patient presents with   Foot Swelling    Cynthia Kelley is a 67 y.o. female.  Patient complains of swelling to both legs and especially right foot with mild pain  The history is provided by the patient and medical records. No language interpreter was used.  Foot Pain This is a new problem. The current episode started 2 days ago. The problem occurs constantly. The problem has not changed since onset.Pertinent negatives include no chest pain, no abdominal pain and no headaches. Nothing aggravates the symptoms. Nothing relieves the symptoms. She has tried nothing for the symptoms.       Home Medications Prior to Admission medications   Medication Sig Start Date End Date Taking? Authorizing Provider  cephALEXin (KEFLEX) 500 MG capsule Take 1 capsule (500 mg total) by mouth 4 (four) times daily. 05/29/23  Yes Bethann Berkshire, MD  furosemide (LASIX) 20 MG tablet Take 1 tablet (20 mg total) by mouth daily. 05/29/23  Yes Bethann Berkshire, MD  amiodarone (PACERONE) 200 MG tablet Take 200 mg by mouth daily. 02/06/21   [provider]  apixaban (ELIQUIS) 5 MG TABS tablet Take 5 mg by mouth 2 (two) times daily. 09/27/20   [provider]  doxycycline (VIBRAMYCIN) 100 MG capsule Take 1 capsule (100 mg total) by mouth 2 (two) times daily. One po bid x 7 days 09/05/22   Geoffery Lyons, MD  HYDROcodone-acetaminophen (NORCO/VICODIN) 5-325 MG tablet Take 1 tablet by mouth every 6 (six) hours as needed for moderate pain. 09/07/22   Oliver Barre, MD  memantine (NAMENDA) 10 MG tablet Take 10 mg by mouth every morning. 02/05/21   [provider]  metoprolol tartrate (LOPRESSOR) 25 MG tablet Take 1 tablet (25 mg total) by mouth 2 (two) times daily. 03/04/22   Erick Blinks, MD  Multiple Vitamins-Minerals (MULTIVITAMIN WITH MINERALS)  tablet Take 1 tablet by mouth daily.    [provider]  polyethylene glycol (MIRALAX / GLYCOLAX) 17 g packet Take 17 g by mouth daily. 03/05/22   Erick Blinks, MD  potassium chloride (KLOR-CON) 10 MEQ tablet Take 10 mEq by mouth 2 (two) times daily. 10/05/21   [provider]  predniSONE (DELTASONE) 10 MG tablet Take 2 tablets (20 mg total) by mouth 2 (two) times daily. 09/05/22   Geoffery Lyons, MD  sodium chloride 1 g tablet Take 1 tablet (1 g total) by mouth 2 (two) times daily with a meal. Patient taking differently: Take 1 g by mouth daily. 03/06/21   Vassie Loll, MD  traZODone (DESYREL) 50 MG tablet Take 50 mg by mouth at bedtime. 02/12/22   [provider]      Allergies    Patient has no known allergies.    Review of Systems   Review of Systems  Constitutional:  Negative for appetite change and fatigue.  HENT:  Negative for congestion, ear discharge and sinus pressure.   Eyes:  Negative for discharge.  Respiratory:  Negative for cough.   Cardiovascular:  Negative for chest pain.  Gastrointestinal:  Negative for abdominal pain and diarrhea.  Genitourinary:  Negative for frequency and hematuria.  Musculoskeletal:  Negative for back pain.       Right foot pain and swelling in right lower leg  Skin:  Negative for rash.  Neurological:  Negative for seizures and headaches.  Psychiatric/Behavioral:  Negative for hallucinations.     Physical Exam Updated Vital Signs BP (!) 171/109   Pulse 84   Temp 98.4 F (36.9 C) (Oral)   Resp 14   Ht 5\' 6"  (1.676 m)   Wt 53.5 kg   SpO2 96%   BMI 19.05 kg/m  Physical Exam Vitals and nursing note reviewed.  Constitutional:      Appearance: She is well-developed.  HENT:     Head: Normocephalic.     Nose: Nose normal.  Eyes:     General: No scleral icterus.    Conjunctiva/sclera: Conjunctivae normal.  Neck:     Thyroid: No thyromegaly.  Cardiovascular:     Rate and Rhythm: Normal rate and regular rhythm.      Heart sounds: No murmur heard.    No friction rub. No gallop.  Pulmonary:     Breath sounds: No stridor. No wheezing or rales.  Chest:     Chest wall: No tenderness.  Abdominal:     General: There is no distension.     Tenderness: There is no abdominal tenderness. There is no rebound.  Musculoskeletal:        General: Normal range of motion.     Cervical back: Neck supple.     Comments: Swelling to right lower leg with right foot possible cellulitis  Lymphadenopathy:     Cervical: No cervical adenopathy.  Skin:    Findings: No erythema or rash.  Neurological:     Mental Status: She is alert and oriented to person, place, and time.     Motor: No abnormal muscle tone.     Coordination: Coordination normal.  Psychiatric:        Behavior: Behavior normal.     ED Results / Procedures / Treatments   Labs (all labs ordered are listed, but only abnormal results are displayed) Labs Reviewed  CBC WITH DIFFERENTIAL/PLATELET - Abnormal; Notable for the following components:      Result Value   Platelets 439 (*)    All other components within normal limits  BASIC METABOLIC PANEL - Abnormal; Notable for the following components:   Sodium 129 (*)    Chloride 96 (*)    Creatinine, Ser 0.39 (*)    Calcium 8.7 (*)    All other components within normal limits  HEPATIC FUNCTION PANEL - Abnormal; Notable for the following components:   Total Protein 6.2 (*)    Albumin 3.1 (*)    Total Bilirubin 1.3 (*)    Bilirubin, Direct 0.4 (*)    All other components within normal limits  BRAIN NATRIURETIC PEPTIDE - Abnormal; Notable for the following components:   B Natriuretic Peptide 942.0 (*)    All other components within normal limits    EKG None  Radiology No results found.  Procedures Procedures    Medications Ordered in ED Medications - No data to display  ED Course/ Medical Decision Making/ A&P                                 Medical Decision Making Amount and/or  Complexity of Data Reviewed Labs: ordered. Radiology: ordered.  Risk Prescription drug management.  This patient presents to the ED for concern of swelling to right foot., this involves an extensive number of treatment options, and is a complaint that carries with it a high risk of complications and morbidity.  The differential diagnosis includes cellulitis, DVT  Co morbidities that complicate the patient evaluation  Dementia   Additional history obtained:  Additional history obtained from husband External records from outside source obtained and reviewed including hospital records   Lab Tests:  I Ordered, and personally interpreted labs.  The pertinent results include: Calcium 8.7   Imaging Studies ordered:  I ordered imaging studies including DVT study I independently visualized and interpreted imaging which showed negative for DVT in right lower leg I agree with the radiologist interpretation   Cardiac Monitoring: / EKG:  The patient was maintained on a cardiac monitor.  I personally viewed and interpreted the cardiac monitored which showed an underlying rhythm of: Normal sinus rhythm   Consultations Obtained: No consultant  Problem List / ED Course / Critical interventions / Medication management  Swelling to right lower leg possible cellulitis I ordered medication including Keflex and Lasix Reevaluation of the patient after these medicines showed that the patient stayed the same I have reviewed the patients home medicines and have made adjustments as needed   Social Determinants of Health:  None   Test / Admission - Considered:  None  Patient with swelling the right foot and ankle.  DVT study negative.  Possible infected foot.  She is placed on Keflex and started on Lasix will follow-up with PCP        Final Clinical Impression(s) / ED Diagnoses Final diagnoses:  Foot swelling    Rx / DC Orders ED Discharge Orders          Ordered     cephALEXin (KEFLEX) 500 MG capsule  4 times daily        05/29/23 1534    furosemide (LASIX) 20 MG tablet  Daily        05/29/23 1534              Bethann Berkshire, MD 05/31/23 1518

## 2023-06-14 ENCOUNTER — Telehealth: Payer: Self-pay

## 2023-06-14 NOTE — Telephone Encounter (Signed)
Transition Care Management Unsuccessful Follow-up Telephone Call  Date of discharge and from where:  [name]  Attempts:  1st Attempt  Reason for unsuccessful TCM follow-up call:  No answer/busy   [name] [place]  [facility] Guide, Phone: [phone] Website: [name]

## 2023-06-15 ENCOUNTER — Telehealth: Payer: Self-pay

## 2023-06-15 NOTE — Telephone Encounter (Signed)
Transition Care Management Unsuccessful Follow-up Telephone Call  Date of discharge and from where:  [name]  Attempts:  2nd  Reason for unsuccessful TCM follow-up call:  No answer/busy   [name]   [facility] Guide, Phone: [phone] Website: [name]

## 2023-06-28 DIAGNOSIS — R001 Bradycardia, unspecified: Secondary | ICD-10-CM | POA: Diagnosis not present

## 2023-06-28 DIAGNOSIS — I5032 Chronic diastolic (congestive) heart failure: Secondary | ICD-10-CM | POA: Diagnosis not present

## 2023-06-28 DIAGNOSIS — I483 Typical atrial flutter: Secondary | ICD-10-CM | POA: Diagnosis not present

## 2023-06-28 DIAGNOSIS — R600 Localized edema: Secondary | ICD-10-CM | POA: Diagnosis not present

## 2023-06-28 DIAGNOSIS — I340 Nonrheumatic mitral (valve) insufficiency: Secondary | ICD-10-CM | POA: Diagnosis not present

## 2023-07-01 ENCOUNTER — Ambulatory Visit (INDEPENDENT_AMBULATORY_CARE_PROVIDER_SITE_OTHER): Payer: Medicare Other | Admitting: Internal Medicine

## 2023-07-01 ENCOUNTER — Encounter: Payer: Self-pay | Admitting: Internal Medicine

## 2023-07-01 VITALS — BP 132/85 | HR 93

## 2023-07-01 DIAGNOSIS — Z23 Encounter for immunization: Secondary | ICD-10-CM

## 2023-07-01 DIAGNOSIS — I483 Typical atrial flutter: Secondary | ICD-10-CM

## 2023-07-01 DIAGNOSIS — Z1211 Encounter for screening for malignant neoplasm of colon: Secondary | ICD-10-CM | POA: Diagnosis not present

## 2023-07-01 DIAGNOSIS — Z720 Tobacco use: Secondary | ICD-10-CM

## 2023-07-01 DIAGNOSIS — I5032 Chronic diastolic (congestive) heart failure: Secondary | ICD-10-CM | POA: Diagnosis not present

## 2023-07-01 DIAGNOSIS — C3491 Malignant neoplasm of unspecified part of right bronchus or lung: Secondary | ICD-10-CM

## 2023-07-01 DIAGNOSIS — N3281 Overactive bladder: Secondary | ICD-10-CM | POA: Diagnosis not present

## 2023-07-01 DIAGNOSIS — G4700 Insomnia, unspecified: Secondary | ICD-10-CM | POA: Insufficient documentation

## 2023-07-01 DIAGNOSIS — C7951 Secondary malignant neoplasm of bone: Secondary | ICD-10-CM

## 2023-07-01 DIAGNOSIS — F0390 Unspecified dementia without behavioral disturbance: Secondary | ICD-10-CM

## 2023-07-01 MED ORDER — MIRTAZAPINE 7.5 MG PO TABS: 7.5000 mg | ORAL_TABLET | Freq: Every day | ORAL | 3 refills | Status: DC

## 2023-07-01 MED ORDER — MIRABEGRON ER 25 MG PO TB24: 25.0000 mg | ORAL_TABLET | Freq: Every day | ORAL | 3 refills | Status: DC

## 2023-07-01 NOTE — Assessment & Plan Note (Addendum)
On Lasix PRN, needs to take it regularly Leg swelling also likely due to chronic venous insufficiency-needs to perform leg elevation and use compression socks as tolerated

## 2023-07-01 NOTE — Progress Notes (Signed)
Established Patient Office Visit  Subjective:  Patient ID: Cynthia Kelley, female    DOB: May 09, 1956  Age: 67 y.o. MRN: [id number]  CC:  Chief Complaint  Patient presents with   Foot Swelling    Feet, ankle and leg swelling    Insomnia    HPI Cynthia Kelley is a 67 y.o. female with past medical history of metastatic SCC of right lung s/p chemotherapy, atrial flutter, diastolic CHF, knee arthritis, anxiety and osteopenia who presents for f/u of her chronic medical conditions.  She was last seen in 02/22.  She has a history of typical atrial flutter, for which she is on metoprolol, amiodarone and Eliquis.  She was recently seen by EP cardiology at [facility] for evaluation for it, and is planned to get cardioversion.  She currently denies any chest pain, dyspnea or palpitations.  She has recent worsening of right more than left LE swelling.  She had run out of Lasix, but has recently received it from cardiology.  She has noticed improvement in leg swelling since restarting Lasix.   She has a history of metastatic SCC of the right lung, for which she was getting chemotherapy.  She follows up with Oncologist at [facility].   She also has a history of diastolic CHF, for which she takes Lasix as needed.  She currently denies any LE swelling.   She has history of anxiety and insomnia, for which she used to take Ativan 1 mg as needed at bedtime.  She currently is borrowing her husband's trazodone.  She has lack of appetite as well.   She complains of urinary frequency and urgency, which are chronic.  Of note, she has to take Lasix for HFpEF.  Denies any dysuria or hematuria.   Past Medical History:  Diagnosis Date   A-fib [facility])    Acute diastolic congestive heart failure, NYHA class 2 (HCC) 11/11/2020   Anemia 11/22/2017   Anxiety 11/19/2020   Arthritis    Asymptomatic PVD (peripheral vascular disease) (HCC) 12/14/2016   Suspected.  Hands hyperemic with decreased cap refill. ? History  vasospasm   COPD with acute exacerbation (HCC) 01/14/2021   Encounter to establish care 11/19/2020   FRACTURE, TIBIAL PLATEAU 05/11/2010   Qualifier: Diagnosis of  By: Romeo Apple MD, [place]     Lung cancer, primary, with metastasis from lung to other site [facility]) 01/16/2018   Metastatic to the liver   Nuclear sclerotic cataract of both eyes 06/08/2018   Osteopenia 05/15/2013   Osteoporosis    osteopenia   Tobacco abuse 12/14/2016    Past Surgical History:  Procedure Laterality Date   FRACTURE SURGERY     torn ACL   KNEE ARTHROSCOPY      Family History  Problem Relation Age of Onset   Cancer Father        died in 75's everywhere   Heart disease Mother    Cancer Mother        lung   Stroke Mother    Asthma Daughter     Social History   Socioeconomic History   Marital status: Married    Spouse name: Shawnte Rogel   Number of children: 1   Years of education: 15   Highest education level: Associate degree: occupational, Scientist, product/process development, or vocational program  Occupational History   Occupation: Research scientist (medical)    Comment: retired  Tobacco Use   Smoking status: Every Day    Current packs/day: 0.50    Average packs/day: 0.5 packs/day for  59.8 years (29.9 ttl pk-yrs)    Types: Cigarettes    Start date: 09/28/1963    Passive exposure: Current   Smokeless tobacco: Never   Tobacco comments:    Smoking Cessation Offered.  Vaping Use   Vaping status: Never Used  Substance and Sexual Activity   Alcohol use: Yes    Alcohol/week: 20.0 standard drinks of alcohol    Types: 20 Cans of beer per week    Comment: 3-4 beers a day   Drug use: No   Sexual activity: Yes    Partners: Male    Birth control/protection: Post-menopausal  Other Topics Concern   Not on file  Social History Narrative   Retired Research scientist (medical)   Lives with husband [name]   Daughter Ludger Nutting is in [place]   Likes to walk   Social Determinants of Health   Financial Resource Strain: Low Risk  (12/16/2022)   Overall Financial  Resource Strain (CARDIA)    Difficulty of Paying Living Expenses: Not hard at all  Food Insecurity: No Food Insecurity (12/16/2022)   Hunger Vital Sign    Worried About Running Out of Food in the Last Year: Never true    Ran Out of Food in the Last Year: Never true  Transportation Needs: No Transportation Needs (12/16/2022)   PRAPARE - Administrator, Civil Service (Medical): No    Lack of Transportation (Non-Medical): No  Physical Activity: Inactive (12/16/2022)   Exercise Vital Sign    Days of Exercise per Week: 0 days    Minutes of Exercise per Session: 0 min  Stress: No Stress Concern Present (12/16/2022)   [employer] of Occupational Health - Occupational Stress Questionnaire    Feeling of Stress : Only a little  Social Connections: Moderately Isolated (12/16/2022)   Social Connection and Isolation Panel [NHANES]    Frequency of Communication with Friends and Family: More than three times a week    Frequency of Social Gatherings with Friends and Family: More than three times a week    Attends Religious Services: Never    Database administrator or Organizations: No    Attends Banker Meetings: Never    Marital Status: Married  Catering manager Violence: Not At Risk (12/16/2022)   Humiliation, Afraid, Rape, and Kick questionnaire    Fear of Current or Ex-Partner: No    Emotionally Abused: No    Physically Abused: No    Sexually Abused: No    Outpatient Medications Prior to Visit  Medication Sig Dispense Refill   metoprolol succinate (TOPROL-XL) 25 MG 24 hr tablet Take 25 mg by mouth daily.     amiodarone (PACERONE) 200 MG tablet Take 200 mg by mouth daily.     apixaban (ELIQUIS) 5 MG TABS tablet Take 5 mg by mouth 2 (two) times daily.     furosemide (LASIX) 20 MG tablet Take 1 tablet (20 mg total) by mouth daily. 7 tablet 0   Multiple Vitamins-Minerals (MULTIVITAMIN WITH MINERALS) tablet Take 1 tablet by mouth daily.     polyethylene glycol (MIRALAX  / GLYCOLAX) 17 g packet Take 17 g by mouth daily. 14 each 0   potassium chloride (KLOR-CON) 10 MEQ tablet Take 10 mEq by mouth 2 (two) times daily.     sodium chloride 1 g tablet Take 1 tablet (1 g total) by mouth 2 (two) times daily with a meal. (Patient taking differently: Take 1 g by mouth daily.) 60 tablet 2   cephALEXin (KEFLEX)  500 MG capsule Take 1 capsule (500 mg total) by mouth 4 (four) times daily. 28 capsule 0   doxycycline (VIBRAMYCIN) 100 MG capsule Take 1 capsule (100 mg total) by mouth 2 (two) times daily. One po bid x 7 days 14 capsule 0   HYDROcodone-acetaminophen (NORCO/VICODIN) 5-325 MG tablet Take 1 tablet by mouth every 6 (six) hours as needed for moderate pain. 20 tablet 0   memantine (NAMENDA) 10 MG tablet Take 10 mg by mouth every morning.     metoprolol tartrate (LOPRESSOR) 25 MG tablet Take 1 tablet (25 mg total) by mouth 2 (two) times daily. 60 tablet 0   predniSONE (DELTASONE) 10 MG tablet Take 2 tablets (20 mg total) by mouth 2 (two) times daily. 20 tablet 0   traZODone (DESYREL) 50 MG tablet Take 50 mg by mouth at bedtime.     No facility-administered medications prior to visit.    No Known Allergies  ROS Review of Systems  Constitutional:  Positive for fatigue. Negative for chills and fever.  HENT:  Negative for congestion, sinus pressure, sinus pain and sore throat.   Eyes:  Negative for pain and discharge.  Respiratory:  Negative for cough and shortness of breath.   Cardiovascular:  Positive for leg swelling. Negative for chest pain and palpitations.  Gastrointestinal:  Negative for abdominal pain, diarrhea, nausea and vomiting.  Endocrine: Negative for polydipsia and polyuria.  Genitourinary:  Positive for frequency and urgency. Negative for dysuria and hematuria.  Musculoskeletal:  Negative for neck pain and neck stiffness.  Skin:  Negative for rash.  Neurological:  Positive for weakness. Negative for dizziness.  Psychiatric/Behavioral:  Positive for  sleep disturbance. Negative for agitation and behavioral problems.       Objective:    Physical Exam Vitals reviewed.  Constitutional:      General: She is not in acute distress.    Appearance: She is not diaphoretic.     Comments: In wheelchair  HENT:     Head: Normocephalic and atraumatic.     Nose: Nose normal. No congestion.     Mouth/Throat:     Mouth: Mucous membranes are moist.     Pharynx: No posterior oropharyngeal erythema.  Eyes:     General: No scleral icterus.    Extraocular Movements: Extraocular movements intact.  Cardiovascular:     Rate and Rhythm: Normal rate. Rhythm irregular.     Heart sounds: Normal heart sounds. No murmur heard. Pulmonary:     Breath sounds: Normal breath sounds. No wheezing or rales.  Musculoskeletal:     Cervical back: Neck supple. No tenderness.     Right lower leg: Edema (1+) present.     Left lower leg: Edema (Trace) present.  Skin:    General: Skin is warm.     Findings: No rash.  Neurological:     General: No focal deficit present.     Mental Status: She is alert and oriented to person, place, and time.  Psychiatric:        Mood and Affect: Mood normal.        Behavior: Behavior normal.     BP 132/85 (BP Location: Left Arm, Patient Position: Sitting, Cuff Size: Normal)   Pulse 93   SpO2 94%  Wt Readings from Last 3 Encounters:  05/29/23 118 lb (53.5 kg)  11/16/22 119 lb (54 kg)  10/01/22 118 lb (53.5 kg)    Lab Results  Component Value Date   TSH 0.934 01/12/2021   Lab  Results  Component Value Date   WBC 9.6 05/29/2023   HGB 13.8 05/29/2023   HCT 40.5 05/29/2023   MCV 96.9 05/29/2023   PLT 439 (H) 05/29/2023   Lab Results  Component Value Date   NA 129 (L) 05/29/2023   K 4.1 05/29/2023   CO2 23 05/29/2023   GLUCOSE 91 05/29/2023   BUN 12 05/29/2023   CREATININE 0.39 (L) 05/29/2023   BILITOT 1.3 (H) 05/29/2023   ALKPHOS 78 05/29/2023   AST 28 05/29/2023   ALT 34 05/29/2023   PROT 6.2 (L) 05/29/2023    ALBUMIN 3.1 (L) 05/29/2023   CALCIUM 8.7 (L) 05/29/2023   ANIONGAP 10 05/29/2023   Lab Results  Component Value Date   CHOL 93 01/16/2018   Lab Results  Component Value Date   HDL 19 (L) 01/16/2018   Lab Results  Component Value Date   LDLCALC 51 01/16/2018   Lab Results  Component Value Date   TRIG 146 01/16/2018   Lab Results  Component Value Date   CHOLHDL 4.9 01/16/2018   No results found for: "HGBA1C"    Assessment & Plan:   Problem List Items Addressed This Visit       Cardiovascular and Mediastinum   Chronic diastolic CHF (congestive heart failure) (HCC) - Primary    On Lasix PRN, needs to take it regularly Leg swelling also likely due to chronic venous insufficiency-needs to perform leg elevation and use compression socks as tolerated      Relevant Medications   metoprolol succinate (TOPROL-XL) 25 MG 24 hr tablet   Typical atrial flutter (HCC)    On Metoprolol, amiodarone and Eliquis Follows up with Cardiology and EP Cardiologist at [facility] Planned to get cardioversion      Relevant Medications   metoprolol succinate (TOPROL-XL) 25 MG 24 hr tablet     Respiratory   Lung cancer, primary, with metastasis from lung to other site [facility])    S/p chemotherapy Follows up with Oncologist at [facility]        Nervous and Auditory   Dementia without behavioral disturbance (HCC)    Was on Namenda, she had stopped taking it She attributes memory deficits to chemotherapy related brain fog Has mild agitation and insomnia- started Remeron      Relevant Medications   mirtazapine (REMERON) 7.5 MG tablet     Musculoskeletal and Integument   Malignant neoplasm metastatic to bone [facility])    S/p chemotherapy Follows up with Oncologist at [facility]        Genitourinary   Overactive bladder    Her symptoms are concerning for overactive bladder Would avoid oxybutynin as it would interact with her other cardiac medications Started Myrbetriq 25 mg once  daily Check UA with reflex culture      Relevant Medications   mirabegron ER (MYRBETRIQ) 25 MG TB24 tablet   Other Relevant Orders   UA/M w/rflx Culture, Routine     Other   Tobacco abuse    Smokes about 0.5 pack/day  Asked about quitting: confirms that he/she currently smokes cigarettes Advise to quit smoking: Educated about QUITTING to reduce the risk of cancer, cardio and cerebrovascular disease. Assess willingness: Unwilling to quit at this time, but is working on cutting back. Assist with counseling and pharmacotherapy: Counseled for 5 minutes and literature provided via MyChart. Arrange for follow up: follow up in 3 months and continue to offer help.      Colon cancer screening    Discussed  about colonoscopy and cologuard - benefits of each procedure discussed. Patient prefers Cologuard - ordered.      Relevant Orders   Cologuard   Insomnia    She has agitation and insomnia with lack of appetite-would start Remeron 7.5 mg qHS      Relevant Medications   mirtazapine (REMERON) 7.5 MG tablet   Other Visit Diagnoses     Encounter for immunization       Relevant Orders   Flu Vaccine Trivalent High Dose (Fluad) (Completed)       Meds ordered this encounter  Medications   mirabegron ER (MYRBETRIQ) 25 MG TB24 tablet    Sig: Take 1 tablet (25 mg total) by mouth daily.    Dispense:  30 tablet    Refill:  3   mirtazapine (REMERON) 7.5 MG tablet    Sig: Take 1 tablet (7.5 mg total) by mouth at bedtime.    Dispense:  30 tablet    Refill:  3    Follow-up: Return in about 3 months (around 10/01/2023).    Anabel Halon, MD

## 2023-07-01 NOTE — Assessment & Plan Note (Signed)
Discussed about colonoscopy and cologuard - benefits of each procedure discussed. Patient prefers Cologuard - ordered.

## 2023-07-01 NOTE — Assessment & Plan Note (Signed)
S/p chemotherapy Follows up with Oncologist at [facility]

## 2023-07-01 NOTE — Assessment & Plan Note (Addendum)
Was on Namenda, she had stopped taking it She attributes memory deficits to chemotherapy related brain fog Has mild agitation and insomnia- started Remeron

## 2023-07-01 NOTE — Assessment & Plan Note (Signed)
Smokes about 0.5 pack/day  Asked about quitting: confirms that he/she currently smokes cigarettes Advise to quit smoking: Educated about QUITTING to reduce the risk of cancer, cardio and cerebrovascular disease. Assess willingness: Unwilling to quit at this time, but is working on cutting back. Assist with counseling and pharmacotherapy: Counseled for 5 minutes and literature provided via MyChart. Arrange for follow up: follow up in 3 months and continue to offer help.

## 2023-07-01 NOTE — Assessment & Plan Note (Signed)
On Metoprolol, amiodarone and Eliquis Follows up with Cardiology and EP Cardiologist at [facility] Planned to get cardioversion

## 2023-07-01 NOTE — Assessment & Plan Note (Signed)
She has agitation and insomnia with lack of appetite-would start Remeron 7.5 mg qHS

## 2023-07-01 NOTE — Assessment & Plan Note (Signed)
Her symptoms are concerning for overactive bladder Would avoid oxybutynin as it would interact with her other cardiac medications Started Myrbetriq 25 mg once daily Check UA with reflex culture

## 2023-07-01 NOTE — Patient Instructions (Addendum)
Please start taking Mirtazepine for insomnia.  Please start taking Myrbetriq for urinary frequency.  Please apply Lamisil cream over toenail and in between toes.

## 2023-07-11 DIAGNOSIS — I4892 Unspecified atrial flutter: Secondary | ICD-10-CM | POA: Diagnosis not present

## 2023-08-03 ENCOUNTER — Telehealth: Payer: Self-pay

## 2023-08-03 NOTE — Patient Outreach (Signed)
[facility] Assistant attempted to call patient on today regarding preventative mammogram screening. No answer from patient after multiple rings. Assistant left confidential voicemail for patient to return call.  Will call back patient back for final attempt.  Baruch Gouty [facility] Assistant [facility] [phone]

## 2023-10-04 ENCOUNTER — Ambulatory Visit: Payer: Medicare Other | Admitting: Internal Medicine

## 2023-10-17 ENCOUNTER — Encounter: Payer: Self-pay | Admitting: Internal Medicine

## 2023-12-08 ENCOUNTER — Encounter: Payer: Self-pay | Admitting: Internal Medicine

## 2023-12-08 ENCOUNTER — Ambulatory Visit (INDEPENDENT_AMBULATORY_CARE_PROVIDER_SITE_OTHER): Payer: Self-pay | Admitting: Internal Medicine

## 2023-12-08 VITALS — BP 125/75 | HR 75 | Ht 66.5 in | Wt 119.0 lb

## 2023-12-08 DIAGNOSIS — C3490 Malignant neoplasm of unspecified part of unspecified bronchus or lung: Secondary | ICD-10-CM | POA: Diagnosis not present

## 2023-12-08 DIAGNOSIS — Z0001 Encounter for general adult medical examination with abnormal findings: Secondary | ICD-10-CM | POA: Diagnosis not present

## 2023-12-08 DIAGNOSIS — F03918 Unspecified dementia, unspecified severity, with other behavioral disturbance: Secondary | ICD-10-CM

## 2023-12-08 DIAGNOSIS — I480 Paroxysmal atrial fibrillation: Secondary | ICD-10-CM | POA: Diagnosis not present

## 2023-12-08 DIAGNOSIS — G4700 Insomnia, unspecified: Secondary | ICD-10-CM

## 2023-12-08 DIAGNOSIS — I5032 Chronic diastolic (congestive) heart failure: Secondary | ICD-10-CM | POA: Diagnosis not present

## 2023-12-08 DIAGNOSIS — Z23 Encounter for immunization: Secondary | ICD-10-CM

## 2023-12-08 DIAGNOSIS — N3281 Overactive bladder: Secondary | ICD-10-CM | POA: Diagnosis not present

## 2023-12-08 MED ORDER — MIRTAZAPINE 7.5 MG PO TABS: 7.5000 mg | ORAL_TABLET | Freq: Every day | ORAL | 1 refills | Status: DC

## 2023-12-08 MED ORDER — MIRABEGRON ER 50 MG PO TB24: 50.0000 mg | ORAL_TABLET | Freq: Every day | ORAL | 5 refills | Status: DC

## 2023-12-08 NOTE — Patient Instructions (Addendum)
 Please schedule Medicare Annual Wellness  Please continue taking Mirtazepine as prescribed.  Please start taking Myrbetriq as prescribed for overactive bladder.  Please continue to follow low salt diet and ambulate as tolerated.

## 2023-12-08 NOTE — Assessment & Plan Note (Signed)
 Physical exam as documented. Prefers to get blood tests at Oncology. Prefers to wait for Mammography. Has Cologuard kit at home, needs to submit sample. PCV20 vaccine today. Advised to get Shingrix and Tdap vaccines at local pharmacy.

## 2023-12-08 NOTE — Assessment & Plan Note (Signed)
On Metoprolol, amiodarone and Eliquis Follows up with Cardiology and EP Cardiologist at [facility] Planned to get cardioversion

## 2023-12-08 NOTE — Assessment & Plan Note (Deleted)
On Metoprolol, amiodarone and Eliquis Follows up with Cardiology and EP Cardiologist at [facility] Planned to get cardioversion

## 2023-12-08 NOTE — Assessment & Plan Note (Signed)
S/p chemotherapy Follows up with Oncologist at [facility]

## 2023-12-08 NOTE — Assessment & Plan Note (Addendum)
 Was on Namenda, she had stopped taking it She attributes memory deficits to chemotherapy related brain fog Has mild agitation and insomnia- had started Remeron, refilled

## 2023-12-08 NOTE — Progress Notes (Signed)
 Established Patient Office Visit  Subjective:  Patient ID: Cynthia Kelley, female    DOB: Jan 17, 1956  Age: 68 y.o. MRN: [id number]  CC:  Chief Complaint  Patient presents with   Care Management    Would like to review medications   Annual Exam    HPI Cynthia Kelley is a 68 y.o. female with past medical history of metastatic SCC of right lung s/p chemotherapy, atrial flutter, diastolic CHF, knee arthritis, anxiety and osteopenia who presents for f/u of her chronic medical conditions.  She was last seen in 10/24.  She has a history of typical atrial flutter/A. Fib., for which she is on metoprolol, amiodarone and Eliquis.  She was recently seen by EP cardiology at [facility] health for evaluation for it, and is planned to get cardioversion.  She currently denies any chest pain, dyspnea or palpitations.  She has noticed improvement in leg swelling since restarting Lasix.  She has a history of metastatic SCC of the right lung, for which she was getting chemotherapy.  She follows up with Oncologist at [facility].   She also has a history of diastolic CHF, for which she takes Lasix as needed.  She currently denies any LE swelling.   She has history of anxiety and insomnia, for which she used to take Ativan 1 mg as needed at bedtime.  She is sleeping well with Mirtazepine now. She has noticed improvement in appetite as well.   She complains of urinary frequency and urgency, which are chronic.  Of note, she has to take Lasix for HFpEF.  Denies any dysuria or hematuria. She was not able to get Myrbetriq from pharmacy, her husband does not know reason.   Past Medical History:  Diagnosis Date   A-fib [facility])    Acute diastolic congestive heart failure, NYHA class 2 (HCC) 11/11/2020   Anemia 11/22/2017   Anxiety 11/19/2020   Arthritis    Asymptomatic PVD (peripheral vascular disease) (HCC) 12/14/2016   Suspected.  Hands hyperemic with decreased cap refill. ? History vasospasm   COPD with acute  exacerbation (HCC) 01/14/2021   Encounter to establish care 11/19/2020   FRACTURE, TIBIAL PLATEAU 05/11/2010   Qualifier: Diagnosis of  By: Romeo Apple MD, [place]     Lung cancer, primary, with metastasis from lung to other site [facility]) 01/16/2018   Metastatic to the liver   Nuclear sclerotic cataract of both eyes 06/08/2018   Osteopenia 05/15/2013   Osteoporosis    osteopenia   Tobacco abuse 12/14/2016    Past Surgical History:  Procedure Laterality Date   FRACTURE SURGERY     torn ACL   KNEE ARTHROSCOPY      Family History  Problem Relation Age of Onset   Cancer Father        died in 49's everywhere   Heart disease Mother    Cancer Mother        lung   Stroke Mother    Asthma Daughter     Social History   Socioeconomic History   Marital status: Married    Spouse name: Venie Montesinos   Number of children: 1   Years of education: 15   Highest education level: Associate degree: occupational, Scientist, product/process development, or vocational program  Occupational History   Occupation: Research scientist (medical)    Comment: retired  Tobacco Use   Smoking status: Every Day    Current packs/day: 0.50    Average packs/day: 0.5 packs/day for 60.2 years (30.1 ttl pk-yrs)    Types: Cigarettes  Start date: 09/28/1963    Passive exposure: Current   Smokeless tobacco: Never   Tobacco comments:    Smoking Cessation Offered. 1 ppd as of 12/08/23  Vaping Use   Vaping status: Never Used  Substance and Sexual Activity   Alcohol use: Yes    Alcohol/week: 20.0 standard drinks of alcohol    Types: 20 Cans of beer per week    Comment: 3-4 beers a day   Drug use: No   Sexual activity: Yes    Partners: Male    Birth control/protection: Post-menopausal  Other Topics Concern   Not on file  Social History Narrative   Retired Research scientist (medical)   Lives with husband [name]   Daughter Ludger Nutting is in [place]   Likes to walk   Social Drivers of Health   Financial Resource Strain: Low Risk  (12/16/2022)   Overall Financial Resource  Strain (CARDIA)    Difficulty of Paying Living Expenses: Not hard at all  Food Insecurity: No Food Insecurity (12/16/2022)   Hunger Vital Sign    Worried About Running Out of Food in the Last Year: Never true    Ran Out of Food in the Last Year: Never true  Transportation Needs: No Transportation Needs (12/16/2022)   PRAPARE - Administrator, Civil Service (Medical): No    Lack of Transportation (Non-Medical): No  Physical Activity: Inactive (12/16/2022)   Exercise Vital Sign    Days of Exercise per Week: 0 days    Minutes of Exercise per Session: 0 min  Stress: No Stress Concern Present (12/16/2022)   [employer] of Occupational Health - Occupational Stress Questionnaire    Feeling of Stress : Only a little  Social Connections: Moderately Isolated (12/16/2022)   Social Connection and Isolation Panel [NHANES]    Frequency of Communication with Friends and Family: More than three times a week    Frequency of Social Gatherings with Friends and Family: More than three times a week    Attends Religious Services: Never    Database administrator or Organizations: No    Attends Banker Meetings: Never    Marital Status: Married  Catering manager Violence: Not At Risk (12/16/2022)   Humiliation, Afraid, Rape, and Kick questionnaire    Fear of Current or Ex-Partner: No    Emotionally Abused: No    Physically Abused: No    Sexually Abused: No    Outpatient Medications Prior to Visit  Medication Sig Dispense Refill   amiodarone (PACERONE) 200 MG tablet Take 200 mg by mouth daily.     apixaban (ELIQUIS) 5 MG TABS tablet Take 5 mg by mouth 2 (two) times daily.     furosemide (LASIX) 20 MG tablet Take 1 tablet (20 mg total) by mouth daily. 7 tablet 0   metoprolol succinate (TOPROL-XL) 25 MG 24 hr tablet Take 25 mg by mouth daily.     Multiple Vitamins-Minerals (MULTIVITAMIN WITH MINERALS) tablet Take 1 tablet by mouth daily.     polyethylene glycol (MIRALAX /  GLYCOLAX) 17 g packet Take 17 g by mouth daily. 14 each 0   potassium chloride (KLOR-CON) 10 MEQ tablet Take 10 mEq by mouth 2 (two) times daily.     sodium chloride 1 g tablet Take 1 tablet (1 g total) by mouth 2 (two) times daily with a meal. (Patient taking differently: Take 1 g by mouth daily.) 60 tablet 2   mirabegron ER (MYRBETRIQ) 25 MG TB24 tablet Take 1 tablet (  25 mg total) by mouth daily. (Patient not taking: Reported on 12/08/2023) 30 tablet 3   mirtazapine (REMERON) 7.5 MG tablet Take 1 tablet (7.5 mg total) by mouth at bedtime. (Patient not taking: Reported on 12/08/2023) 30 tablet 3   No facility-administered medications prior to visit.    No Known Allergies  ROS Review of Systems  Constitutional:  Positive for fatigue. Negative for chills and fever.  HENT:  Negative for congestion, sinus pressure, sinus pain and sore throat.   Eyes:  Negative for pain and discharge.  Respiratory:  Negative for cough and shortness of breath.   Cardiovascular:  Positive for leg swelling. Negative for chest pain and palpitations.  Gastrointestinal:  Negative for abdominal pain, diarrhea, nausea and vomiting.  Endocrine: Negative for polydipsia and polyuria.  Genitourinary:  Positive for frequency and urgency. Negative for dysuria and hematuria.  Musculoskeletal:  Negative for neck pain and neck stiffness.  Skin:  Negative for rash.  Neurological:  Positive for weakness. Negative for dizziness.  Psychiatric/Behavioral:  Positive for sleep disturbance. Negative for agitation and behavioral problems.       Objective:    Physical Exam Vitals reviewed.  Constitutional:      General: She is not in acute distress.    Appearance: She is not diaphoretic.     Comments: In wheelchair  HENT:     Head: Normocephalic and atraumatic.     Nose: Nose normal. No congestion.     Mouth/Throat:     Mouth: Mucous membranes are moist.     Pharynx: No posterior oropharyngeal erythema.  Eyes:     General:  No scleral icterus.    Extraocular Movements: Extraocular movements intact.  Cardiovascular:     Rate and Rhythm: Normal rate. Rhythm irregular.     Heart sounds: Normal heart sounds. No murmur heard. Pulmonary:     Breath sounds: Normal breath sounds. No wheezing or rales.  Abdominal:     Palpations: Abdomen is soft.     Tenderness: There is no abdominal tenderness.  Musculoskeletal:     Cervical back: Neck supple. No tenderness.     Right lower leg: Edema (1+) present.     Left lower leg: Edema (Trace) present.  Skin:    General: Skin is warm.     Findings: No rash.  Neurological:     General: No focal deficit present.     Mental Status: She is alert and oriented to person, place, and time.     Sensory: No sensory deficit.     Motor: Weakness (B/l LE - 4/5) present.  Psychiatric:        Mood and Affect: Mood normal.        Behavior: Behavior normal.     BP 125/75   Pulse 75   Ht 5' 6.5" (1.689 m)   Wt 119 lb (54 kg)   SpO2 96%   BMI 18.92 kg/m  Wt Readings from Last 3 Encounters:  12/08/23 119 lb (54 kg)  05/29/23 118 lb (53.5 kg)  11/16/22 119 lb (54 kg)    Lab Results  Component Value Date   TSH 0.934 01/12/2021   Lab Results  Component Value Date   WBC 9.6 05/29/2023   HGB 13.8 05/29/2023   HCT 40.5 05/29/2023   MCV 96.9 05/29/2023   PLT 439 (H) 05/29/2023   Lab Results  Component Value Date   NA 129 (L) 05/29/2023   K 4.1 05/29/2023   CO2 23 05/29/2023   GLUCOSE  91 05/29/2023   BUN 12 05/29/2023   CREATININE 0.39 (L) 05/29/2023   BILITOT 1.3 (H) 05/29/2023   ALKPHOS 78 05/29/2023   AST 28 05/29/2023   ALT 34 05/29/2023   PROT 6.2 (L) 05/29/2023   ALBUMIN 3.1 (L) 05/29/2023   CALCIUM 8.7 (L) 05/29/2023   ANIONGAP 10 05/29/2023   Lab Results  Component Value Date   CHOL 93 01/16/2018   Lab Results  Component Value Date   HDL 19 (L) 01/16/2018   Lab Results  Component Value Date   LDLCALC 51 01/16/2018   Lab Results  Component  Value Date   TRIG 146 01/16/2018   Lab Results  Component Value Date   CHOLHDL 4.9 01/16/2018   No results found for: "HGBA1C"    Assessment & Plan:   Problem List Items Addressed This Visit       Cardiovascular and Mediastinum   Chronic diastolic CHF (congestive heart failure) (HCC)   On Lasix PRN, needs to take it regularly Leg swelling also likely due to chronic venous insufficiency-needs to perform leg elevation and use compression socks as tolerated      Paroxysmal atrial fibrillation (HCC)   On Metoprolol, amiodarone and Eliquis Follows up with Cardiology and EP Cardiologist at [facility] Planned to get cardioversion        Respiratory   Lung cancer, primary, with metastasis from lung to other site [facility]   S/p chemotherapy Follows up with Oncologist at [facility]        Nervous and Auditory   Dementia with behavioral disturbance (HCC)   Was on Namenda, she had stopped taking it She attributes memory deficits to chemotherapy related brain fog Has mild agitation and insomnia- had started Remeron, refilled      Relevant Medications   mirtazapine (REMERON) 7.5 MG tablet     Genitourinary   Overactive bladder   Her symptoms are concerning for overactive bladder Would avoid oxybutynin as it would interact with her other cardiac medications Started Myrbetriq 50 mg once daily, sent prescription again Check UA with reflex culture      Relevant Medications   mirabegron ER (MYRBETRIQ) 50 MG TB24 tablet     Other   Insomnia   She has agitation and insomnia with lack of appetite- had started Remeron 7.5 mg qHS      Relevant Medications   mirtazapine (REMERON) 7.5 MG tablet   Encounter for general adult medical examination with abnormal findings - Primary   Physical exam as documented. Prefers to get blood tests at Oncology. Prefers to wait for Mammography. Has Cologuard kit at home, needs to submit sample. PCV20 vaccine today. Advised to get Shingrix and  Tdap vaccines at local pharmacy.      Other Visit Diagnoses       Encounter for immunization       Relevant Orders   Pneumococcal conjugate vaccine 20-valent (Completed)       Meds ordered this encounter  Medications   mirtazapine (REMERON) 7.5 MG tablet    Sig: Take 1 tablet (7.5 mg total) by mouth at bedtime.    Dispense:  90 tablet    Refill:  1   mirabegron ER (MYRBETRIQ) 50 MG TB24 tablet    Sig: Take 1 tablet (50 mg total) by mouth daily.    Dispense:  30 tablet    Refill:  5    Follow-up: Return in about 6 months (around 06/09/2024) for Insomnia and overactive bladder.    Glendale Youngblood K  Allena Katz, MD

## 2023-12-08 NOTE — Assessment & Plan Note (Signed)
On Lasix PRN, needs to take it regularly Leg swelling also likely due to chronic venous insufficiency-needs to perform leg elevation and use compression socks as tolerated

## 2023-12-08 NOTE — Assessment & Plan Note (Signed)
 She has agitation and insomnia with lack of appetite- had started Remeron 7.5 mg qHS

## 2023-12-08 NOTE — Assessment & Plan Note (Signed)
 Her symptoms are concerning for overactive bladder Would avoid oxybutynin as it would interact with her other cardiac medications Started Myrbetriq 50 mg once daily, sent prescription again Check UA with reflex culture

## 2023-12-15 ENCOUNTER — Other Ambulatory Visit: Payer: Self-pay

## 2023-12-15 ENCOUNTER — Inpatient Hospital Stay (HOSPITAL_COMMUNITY)
Admission: EM | Admit: 2023-12-15 | Discharge: 2023-12-25 | DRG: 477 | Disposition: A | Discharge status: Skilled Nursing Facility | Attending: Internal Medicine | Admitting: Internal Medicine

## 2023-12-15 ENCOUNTER — Emergency Department (HOSPITAL_COMMUNITY)

## 2023-12-15 ENCOUNTER — Encounter (HOSPITAL_COMMUNITY): Payer: Self-pay | Admitting: Emergency Medicine

## 2023-12-15 DIAGNOSIS — M86172 Other acute osteomyelitis, left ankle and foot: Secondary | ICD-10-CM | POA: Diagnosis not present

## 2023-12-15 DIAGNOSIS — M84452A Pathological fracture, left femur, initial encounter for fracture: Principal | ICD-10-CM | POA: Diagnosis present

## 2023-12-15 DIAGNOSIS — F10131 Alcohol abuse with withdrawal delirium: Secondary | ICD-10-CM | POA: Diagnosis not present

## 2023-12-15 DIAGNOSIS — I480 Paroxysmal atrial fibrillation: Secondary | ICD-10-CM | POA: Diagnosis present

## 2023-12-15 DIAGNOSIS — R339 Retention of urine, unspecified: Secondary | ICD-10-CM | POA: Diagnosis not present

## 2023-12-15 DIAGNOSIS — I5032 Chronic diastolic (congestive) heart failure: Secondary | ICD-10-CM | POA: Diagnosis not present

## 2023-12-15 DIAGNOSIS — R2689 Other abnormalities of gait and mobility: Secondary | ICD-10-CM | POA: Diagnosis not present

## 2023-12-15 DIAGNOSIS — I739 Peripheral vascular disease, unspecified: Secondary | ICD-10-CM | POA: Diagnosis not present

## 2023-12-15 DIAGNOSIS — F17210 Nicotine dependence, cigarettes, uncomplicated: Secondary | ICD-10-CM | POA: Diagnosis present

## 2023-12-15 DIAGNOSIS — I081 Rheumatic disorders of both mitral and tricuspid valves: Secondary | ICD-10-CM | POA: Diagnosis not present

## 2023-12-15 DIAGNOSIS — R17 Unspecified jaundice: Secondary | ICD-10-CM | POA: Diagnosis not present

## 2023-12-15 DIAGNOSIS — I4819 Other persistent atrial fibrillation: Secondary | ICD-10-CM | POA: Diagnosis not present

## 2023-12-15 DIAGNOSIS — I5030 Unspecified diastolic (congestive) heart failure: Secondary | ICD-10-CM | POA: Diagnosis not present

## 2023-12-15 DIAGNOSIS — I272 Pulmonary hypertension, unspecified: Secondary | ICD-10-CM | POA: Diagnosis present

## 2023-12-15 DIAGNOSIS — E8809 Other disorders of plasma-protein metabolism, not elsewhere classified: Secondary | ICD-10-CM | POA: Diagnosis not present

## 2023-12-15 DIAGNOSIS — J449 Chronic obstructive pulmonary disease, unspecified: Secondary | ICD-10-CM | POA: Diagnosis present

## 2023-12-15 DIAGNOSIS — S72042A Displaced fracture of base of neck of left femur, initial encounter for closed fracture: Secondary | ICD-10-CM

## 2023-12-15 DIAGNOSIS — S72002A Fracture of unspecified part of neck of left femur, initial encounter for closed fracture: Secondary | ICD-10-CM

## 2023-12-15 DIAGNOSIS — R9082 White matter disease, unspecified: Secondary | ICD-10-CM | POA: Diagnosis not present

## 2023-12-15 DIAGNOSIS — I4891 Unspecified atrial fibrillation: Secondary | ICD-10-CM | POA: Diagnosis not present

## 2023-12-15 DIAGNOSIS — I959 Hypotension, unspecified: Secondary | ICD-10-CM | POA: Diagnosis not present

## 2023-12-15 DIAGNOSIS — G9341 Metabolic encephalopathy: Secondary | ICD-10-CM | POA: Diagnosis not present

## 2023-12-15 DIAGNOSIS — F1010 Alcohol abuse, uncomplicated: Secondary | ICD-10-CM | POA: Diagnosis not present

## 2023-12-15 DIAGNOSIS — E871 Hypo-osmolality and hyponatremia: Secondary | ICD-10-CM | POA: Diagnosis not present

## 2023-12-15 DIAGNOSIS — Z801 Family history of malignant neoplasm of trachea, bronchus and lung: Secondary | ICD-10-CM

## 2023-12-15 DIAGNOSIS — E876 Hypokalemia: Secondary | ICD-10-CM | POA: Diagnosis not present

## 2023-12-15 DIAGNOSIS — S7292XA Unspecified fracture of left femur, initial encounter for closed fracture: Secondary | ICD-10-CM | POA: Diagnosis present

## 2023-12-15 DIAGNOSIS — F10129 Alcohol abuse with intoxication, unspecified: Secondary | ICD-10-CM | POA: Diagnosis present

## 2023-12-15 DIAGNOSIS — S42291A Other displaced fracture of upper end of right humerus, initial encounter for closed fracture: Secondary | ICD-10-CM | POA: Diagnosis not present

## 2023-12-15 DIAGNOSIS — Z043 Encounter for examination and observation following other accident: Secondary | ICD-10-CM | POA: Diagnosis not present

## 2023-12-15 DIAGNOSIS — S72142A Displaced intertrochanteric fracture of left femur, initial encounter for closed fracture: Secondary | ICD-10-CM | POA: Diagnosis not present

## 2023-12-15 DIAGNOSIS — F10920 Alcohol use, unspecified with intoxication, uncomplicated: Secondary | ICD-10-CM

## 2023-12-15 DIAGNOSIS — Y904 Blood alcohol level of 80-99 mg/100 ml: Secondary | ICD-10-CM | POA: Diagnosis present

## 2023-12-15 DIAGNOSIS — F419 Anxiety disorder, unspecified: Secondary | ICD-10-CM | POA: Diagnosis present

## 2023-12-15 DIAGNOSIS — C787 Secondary malignant neoplasm of liver and intrahepatic bile duct: Secondary | ICD-10-CM | POA: Diagnosis not present

## 2023-12-15 DIAGNOSIS — F10929 Alcohol use, unspecified with intoxication, unspecified: Secondary | ICD-10-CM | POA: Diagnosis present

## 2023-12-15 DIAGNOSIS — E440 Moderate protein-calorie malnutrition: Secondary | ICD-10-CM | POA: Diagnosis present

## 2023-12-15 DIAGNOSIS — Z781 Physical restraint status: Secondary | ICD-10-CM

## 2023-12-15 DIAGNOSIS — Z825 Family history of asthma and other chronic lower respiratory diseases: Secondary | ICD-10-CM

## 2023-12-15 DIAGNOSIS — I483 Typical atrial flutter: Secondary | ICD-10-CM | POA: Diagnosis not present

## 2023-12-15 DIAGNOSIS — F05 Delirium due to known physiological condition: Secondary | ICD-10-CM | POA: Diagnosis not present

## 2023-12-15 DIAGNOSIS — Z85118 Personal history of other malignant neoplasm of bronchus and lung: Secondary | ICD-10-CM

## 2023-12-15 DIAGNOSIS — Z823 Family history of stroke: Secondary | ICD-10-CM

## 2023-12-15 DIAGNOSIS — C3490 Malignant neoplasm of unspecified part of unspecified bronchus or lung: Secondary | ICD-10-CM | POA: Diagnosis present

## 2023-12-15 DIAGNOSIS — R6889 Other general symptoms and signs: Secondary | ICD-10-CM | POA: Diagnosis not present

## 2023-12-15 DIAGNOSIS — M1712 Unilateral primary osteoarthritis, left knee: Secondary | ICD-10-CM | POA: Diagnosis present

## 2023-12-15 DIAGNOSIS — Z743 Need for continuous supervision: Secondary | ICD-10-CM | POA: Diagnosis not present

## 2023-12-15 DIAGNOSIS — D509 Iron deficiency anemia, unspecified: Secondary | ICD-10-CM | POA: Diagnosis present

## 2023-12-15 DIAGNOSIS — Z6821 Body mass index (BMI) 21.0-21.9, adult: Secondary | ICD-10-CM

## 2023-12-15 DIAGNOSIS — I499 Cardiac arrhythmia, unspecified: Secondary | ICD-10-CM | POA: Diagnosis not present

## 2023-12-15 DIAGNOSIS — W1830XA Fall on same level, unspecified, initial encounter: Secondary | ICD-10-CM | POA: Diagnosis present

## 2023-12-15 DIAGNOSIS — D649 Anemia, unspecified: Secondary | ICD-10-CM | POA: Diagnosis not present

## 2023-12-15 DIAGNOSIS — F03918 Unspecified dementia, unspecified severity, with other behavioral disturbance: Secondary | ICD-10-CM | POA: Diagnosis present

## 2023-12-15 DIAGNOSIS — Z7401 Bed confinement status: Secondary | ICD-10-CM | POA: Diagnosis not present

## 2023-12-15 DIAGNOSIS — Z9889 Other specified postprocedural states: Secondary | ICD-10-CM

## 2023-12-15 DIAGNOSIS — Y92002 Bathroom of unspecified non-institutional (private) residence single-family (private) house as the place of occurrence of the external cause: Secondary | ICD-10-CM

## 2023-12-15 DIAGNOSIS — I509 Heart failure, unspecified: Secondary | ICD-10-CM | POA: Diagnosis not present

## 2023-12-15 DIAGNOSIS — S7292XD Unspecified fracture of left femur, subsequent encounter for closed fracture with routine healing: Secondary | ICD-10-CM | POA: Diagnosis not present

## 2023-12-15 DIAGNOSIS — W19XXXA Unspecified fall, initial encounter: Principal | ICD-10-CM

## 2023-12-15 DIAGNOSIS — Z8249 Family history of ischemic heart disease and other diseases of the circulatory system: Secondary | ICD-10-CM

## 2023-12-15 DIAGNOSIS — C7951 Secondary malignant neoplasm of bone: Secondary | ICD-10-CM | POA: Diagnosis not present

## 2023-12-15 DIAGNOSIS — M25552 Pain in left hip: Secondary | ICD-10-CM | POA: Diagnosis not present

## 2023-12-15 DIAGNOSIS — Z7141 Alcohol abuse counseling and surveillance of alcoholic: Secondary | ICD-10-CM

## 2023-12-15 DIAGNOSIS — I952 Hypotension due to drugs: Secondary | ICD-10-CM | POA: Diagnosis not present

## 2023-12-15 DIAGNOSIS — M810 Age-related osteoporosis without current pathological fracture: Secondary | ICD-10-CM | POA: Diagnosis present

## 2023-12-15 DIAGNOSIS — R0902 Hypoxemia: Secondary | ICD-10-CM | POA: Diagnosis not present

## 2023-12-15 DIAGNOSIS — E119 Type 2 diabetes mellitus without complications: Secondary | ICD-10-CM | POA: Diagnosis not present

## 2023-12-15 DIAGNOSIS — M6281 Muscle weakness (generalized): Secondary | ICD-10-CM | POA: Diagnosis not present

## 2023-12-15 DIAGNOSIS — Z79899 Other long term (current) drug therapy: Secondary | ICD-10-CM

## 2023-12-15 DIAGNOSIS — I110 Hypertensive heart disease with heart failure: Secondary | ICD-10-CM | POA: Diagnosis present

## 2023-12-15 DIAGNOSIS — R001 Bradycardia, unspecified: Secondary | ICD-10-CM | POA: Diagnosis present

## 2023-12-15 DIAGNOSIS — H2513 Age-related nuclear cataract, bilateral: Secondary | ICD-10-CM | POA: Diagnosis present

## 2023-12-15 DIAGNOSIS — S72142D Displaced intertrochanteric fracture of left femur, subsequent encounter for closed fracture with routine healing: Secondary | ICD-10-CM | POA: Diagnosis not present

## 2023-12-15 DIAGNOSIS — D72829 Elevated white blood cell count, unspecified: Secondary | ICD-10-CM | POA: Diagnosis not present

## 2023-12-15 DIAGNOSIS — Z91148 Patient's other noncompliance with medication regimen for other reason: Secondary | ICD-10-CM

## 2023-12-15 DIAGNOSIS — Z7901 Long term (current) use of anticoagulants: Secondary | ICD-10-CM

## 2023-12-15 DIAGNOSIS — R600 Localized edema: Secondary | ICD-10-CM | POA: Diagnosis not present

## 2023-12-15 DIAGNOSIS — D62 Acute posthemorrhagic anemia: Secondary | ICD-10-CM | POA: Diagnosis not present

## 2023-12-15 DIAGNOSIS — S79929A Unspecified injury of unspecified thigh, initial encounter: Secondary | ICD-10-CM | POA: Diagnosis not present

## 2023-12-15 DIAGNOSIS — I700 Atherosclerosis of aorta: Secondary | ICD-10-CM | POA: Diagnosis not present

## 2023-12-15 DIAGNOSIS — Z87311 Personal history of (healed) other pathological fracture: Secondary | ICD-10-CM

## 2023-12-15 DIAGNOSIS — I10 Essential (primary) hypertension: Secondary | ICD-10-CM | POA: Diagnosis not present

## 2023-12-15 DIAGNOSIS — Z809 Family history of malignant neoplasm, unspecified: Secondary | ICD-10-CM

## 2023-12-15 DIAGNOSIS — J441 Chronic obstructive pulmonary disease with (acute) exacerbation: Secondary | ICD-10-CM | POA: Diagnosis not present

## 2023-12-15 LAB — COMPREHENSIVE METABOLIC PANEL
ALT: 27 U/L (ref 0–44)
AST: 37 U/L (ref 15–41)
Albumin: 3.9 g/dL (ref 3.5–5.0)
Alkaline Phosphatase: 90 U/L (ref 38–126)
Anion gap: 12 (ref 5–15)
BUN: 25 mg/dL — ABNORMAL HIGH (ref 8–23)
CO2: 22 mmol/L (ref 22–32)
Calcium: 9.2 mg/dL (ref 8.9–10.3)
Chloride: 98 mmol/L (ref 98–111)
Creatinine, Ser: 0.63 mg/dL (ref 0.44–1.00)
GFR, Estimated: >60 mL/min (ref >60)
Glucose, Bld: 90 mg/dL (ref 70–99)
Potassium: 3.9 mmol/L (ref 3.5–5.1)
Sodium: 132 mmol/L — ABNORMAL LOW (ref 135–145)
Total Bilirubin: 0.5 mg/dL (ref 0.0–1.2)
Total Protein: 7.1 g/dL (ref 6.5–8.1)

## 2023-12-15 LAB — CBC WITH DIFFERENTIAL/PLATELET
Abs Immature Granulocytes: 0.04 10*3/uL (ref 0.00–0.07)
Basophils Absolute: 0.1 10*3/uL (ref 0.0–0.1)
Basophils Relative: 1 %
Eosinophils Absolute: 0.1 10*3/uL (ref 0.0–0.5)
Eosinophils Relative: 2 %
HCT: 41 % (ref 36.0–46.0)
Hemoglobin: 13.7 g/dL (ref 12.0–15.0)
Immature Granulocytes: 1 %
Lymphocytes Relative: 20 %
Lymphs Abs: 1.7 10*3/uL (ref 0.7–4.0)
MCH: 32.9 pg (ref 26.0–34.0)
MCHC: 33.4 g/dL (ref 30.0–36.0)
MCV: 98.3 fL (ref 80.0–100.0)
Monocytes Absolute: 0.7 10*3/uL (ref 0.1–1.0)
Monocytes Relative: 9 %
Neutro Abs: 5.7 10*3/uL (ref 1.7–7.7)
Neutrophils Relative %: 67 %
Platelets: 296 10*3/uL (ref 150–400)
RBC: 4.17 MIL/uL (ref 3.87–5.11)
RDW: 13.7 % (ref 11.5–15.5)
WBC: 8.3 10*3/uL (ref 4.0–10.5)
nRBC: 0 % (ref 0.0–0.2)

## 2023-12-15 LAB — PROTIME-INR
INR: 1 (ref 0.8–1.2)
Prothrombin Time: 13.2 s (ref 11.4–15.2)

## 2023-12-15 LAB — TROPONIN I (HIGH SENSITIVITY)
Troponin I (High Sensitivity): 5 ng/L (ref <18)
Troponin I (High Sensitivity): 4 ng/L (ref <18)

## 2023-12-15 LAB — ETHANOL: Alcohol, Ethyl (B): 91 mg/dL — ABNORMAL HIGH (ref <10)

## 2023-12-15 LAB — PHOSPHORUS: Phosphorus: 2.8 mg/dL (ref 2.5–4.6)

## 2023-12-15 LAB — TYPE AND SCREEN
ABO/RH(D): O POS
Antibody Screen: NEGATIVE

## 2023-12-15 LAB — MAGNESIUM: Magnesium: 1.9 mg/dL (ref 1.7–2.4)

## 2023-12-15 MED ORDER — SODIUM CHLORIDE 0.9 % IV SOLN: INTRAVENOUS | Status: AC

## 2023-12-15 MED ORDER — FOLIC ACID 1 MG PO TABS
1.0000 mg | ORAL_TABLET | Freq: Every day | ORAL | Status: DC
  Administered 2023-12-17 – 2023-12-25 (×9): 1 mg via ORAL
  Filled 2023-12-15 (×9): qty 1

## 2023-12-15 MED ORDER — THIAMINE MONONITRATE 100 MG PO TABS
100.0000 mg | ORAL_TABLET | Freq: Every day | ORAL | Status: DC
  Administered 2023-12-17 – 2023-12-25 (×9): 100 mg via ORAL
  Filled 2023-12-15 (×9): qty 1

## 2023-12-15 MED ORDER — MORPHINE SULFATE (PF) 4 MG/ML IV SOLN
4.0000 mg | Freq: Once | INTRAVENOUS | Status: AC
  Administered 2023-12-15: 4 mg via INTRAVENOUS
  Filled 2023-12-15: qty 1

## 2023-12-15 MED ORDER — ONDANSETRON HCL 4 MG/2ML IJ SOLN
4.0000 mg | Freq: Four times a day (QID) | INTRAMUSCULAR | Status: DC | PRN
Start: 2023-12-15 — End: 2023-12-25

## 2023-12-15 MED ORDER — LORAZEPAM 2 MG/ML IJ SOLN
1.0000 mg | INTRAMUSCULAR | Status: AC | PRN
  Administered 2023-12-17 – 2023-12-18 (×4): 2 mg via INTRAVENOUS
  Filled 2023-12-15 (×4): qty 1

## 2023-12-15 MED ORDER — ONDANSETRON HCL 4 MG/2ML IJ SOLN
4.0000 mg | Freq: Once | INTRAMUSCULAR | Status: AC
  Administered 2023-12-15: 4 mg via INTRAVENOUS
  Filled 2023-12-15: qty 2

## 2023-12-15 MED ORDER — METOPROLOL SUCCINATE ER 25 MG PO TB24
25.0000 mg | ORAL_TABLET | Freq: Every day | ORAL | Status: DC
Start: 2023-12-15 — End: 2023-12-19
  Administered 2023-12-15 – 2023-12-18 (×3): 25 mg via ORAL
  Filled 2023-12-15 (×4): qty 1

## 2023-12-15 MED ORDER — ACETAMINOPHEN 650 MG RE SUPP: 650.0000 mg | Freq: Four times a day (QID) | RECTAL | Status: DC | PRN

## 2023-12-15 MED ORDER — LORAZEPAM 1 MG PO TABS
1.0000 mg | ORAL_TABLET | ORAL | Status: AC | PRN
  Administered 2023-12-16: 2 mg via ORAL
  Administered 2023-12-17 (×5): 1 mg via ORAL
  Filled 2023-12-15: qty 2
  Filled 2023-12-15 (×5): qty 1

## 2023-12-15 MED ORDER — AMIODARONE HCL 200 MG PO TABS
200.0000 mg | ORAL_TABLET | Freq: Every day | ORAL | Status: DC
  Administered 2023-12-15 – 2023-12-18 (×4): 200 mg via ORAL
  Filled 2023-12-15 (×4): qty 1

## 2023-12-15 MED ORDER — MORPHINE SULFATE (PF) 2 MG/ML IV SOLN
2.0000 mg | INTRAVENOUS | Status: DC | PRN
  Administered 2023-12-15 – 2023-12-16 (×2): 2 mg via INTRAVENOUS
  Filled 2023-12-15 (×2): qty 1

## 2023-12-15 MED ORDER — POLYETHYLENE GLYCOL 3350 17 G PO PACK
17.0000 g | PACK | Freq: Every day | ORAL | Status: DC | PRN
  Administered 2023-12-19: 17 g via ORAL
  Filled 2023-12-15: qty 1

## 2023-12-15 MED ORDER — MIRTAZAPINE 15 MG PO TABS
7.5000 mg | ORAL_TABLET | Freq: Every day | ORAL | Status: DC
  Administered 2023-12-15 – 2023-12-24 (×10): 7.5 mg via ORAL
  Filled 2023-12-15 (×9): qty 1

## 2023-12-15 MED ORDER — MIRABEGRON ER 50 MG PO TB24
50.0000 mg | ORAL_TABLET | Freq: Every day | ORAL | Status: DC
  Administered 2023-12-17 – 2023-12-25 (×9): 50 mg via ORAL
  Filled 2023-12-15 (×9): qty 1

## 2023-12-15 MED ORDER — ACETAMINOPHEN 325 MG PO TABS
650.0000 mg | ORAL_TABLET | Freq: Four times a day (QID) | ORAL | Status: DC | PRN
  Administered 2023-12-15: 650 mg via ORAL
  Filled 2023-12-15: qty 2

## 2023-12-15 MED ORDER — SODIUM CHLORIDE 0.9 % IV BOLUS
500.0000 mL | Freq: Once | INTRAVENOUS | Status: AC
  Administered 2023-12-15: 500 mL via INTRAVENOUS

## 2023-12-15 MED ORDER — ONDANSETRON HCL 4 MG PO TABS: 4.0000 mg | ORAL_TABLET | Freq: Four times a day (QID) | ORAL | Status: DC | PRN

## 2023-12-15 MED ORDER — THIAMINE HCL 100 MG/ML IJ SOLN
100.0000 mg | Freq: Every day | INTRAMUSCULAR | Status: DC
Start: 2023-12-16 — End: 2023-12-19

## 2023-12-15 MED ORDER — ADULT MULTIVITAMIN W/MINERALS CH
1.0000 | ORAL_TABLET | Freq: Every day | ORAL | Status: DC
  Administered 2023-12-17 – 2023-12-25 (×9): 1 via ORAL
  Filled 2023-12-15 (×8): qty 1

## 2023-12-15 NOTE — Assessment & Plan Note (Addendum)
 Blood alcohol level of 91.  Patient presented with fall.  She is on anticoagulation with Eliquis.  She drinks 6 alcoholic beverages daily, she tells me she drinks vodka.  Last drink was this morning.  No sign of withdrawal at this time. -CIWA as needed -Thiamine folate multivitamins -Potassium 3.9, normal Phos-2.8 and mag-1.9

## 2023-12-15 NOTE — ED Triage Notes (Signed)
 Pt arrived via [facility] from home c/o a fall in the bathroom on hard tile, denies hitting head, denies LOC, denies use of blood thinners. Per EMS, pts L leg/hip is about 3 inches shortened and internally rotated, pts spouse stated to EMS that pt does drink around 6 alcoholic beverages/day and she did drink 6 alcoholic beverages today as well. EMS administered 3 mg of morphine at 1803, pts initial pain before pain medication was 10/10 and now states it is 7/10 especially with movement

## 2023-12-15 NOTE — Progress Notes (Addendum)
 Patient ID: Cynthia Kelley, female   DOB: 11-16-1955, 68 y.o.   MRN: [id number]  Left hip fracture   Past Medical History:  Diagnosis Date   A-fib [facility])    Acute diastolic congestive heart failure, NYHA class 2 (HCC) 11/11/2020   Anemia 11/22/2017   Anxiety 11/19/2020   Arthritis    Asymptomatic PVD (peripheral vascular disease) (HCC) 12/14/2016   Suspected.  Hands hyperemic with decreased cap refill. ? History vasospasm   COPD with acute exacerbation (HCC) 01/14/2021   Encounter to establish care 11/19/2020   FRACTURE, TIBIAL PLATEAU 05/11/2010   Qualifier: Diagnosis of  By: Romeo Apple MD, Teylor Wolven     Lung cancer, primary, with metastasis from lung to other site [facility]) 01/16/2018   Metastatic to the liver   Nuclear sclerotic cataract of both eyes 06/08/2018   Osteopenia 05/15/2013   Osteoporosis    osteopenia   Tobacco abuse 12/14/2016   BP 116/68   Pulse 79   Temp 98.2 F (36.8 C) (Oral)   Resp 18   SpO2 95%   NOTE: the patient has a pathologic fracture from mets and with her heart arrhythmias Assessment and Plan:   06/2023 Cardiology Note [facility] :  1. Chronic diastolic congestive heart failure, NYHA class 2 (HCC)   2. Typical atrial flutter (CMD) ECG 12 lead   3. Edema, lower extremity   4. Moderate mitral regurgitation by prior echocardiogram 11/2020   5. Sinus bradycardia    ASSESSMENT & PLAN:  Paroxysmal now persistent typical atrial flutter- increasing amiodarone back to 200 mg daily, and increasing metoprolol succinate to 50 mg daily and Eliquis 5 mg twice daily. DC cardioversion ordered, will hold metoprolol the day of the procedure and take 25 mg metoprolol the day before.   06/28/2023 DCCV ordered for 4 weeks hence.  Resolved diastolic CHF- due to loss of effective atrial contraction when in rapid atrial flutter episode, no recurrence. TTE checked 03/2021 with normal LV function. Has no symptoms of CHF, CT chest 04/2022 no CHF. No new meds.  06/28/2023  recurrent HFpEF due to persistent atrial flutter. Adding back furosemide 20 mg daily.  Moderate MR 2022- TTE ordered for after cardioversion. 6/24 EF 65%, 2+MR, recheck annually, next 02/2024.  HTN-BPs are well controlled  Sinus bradycardia-resolved-with reduced metoprolol succinate to 25 mg daily. For now increasing to 50mg  while in aflutter, plan to resume at 25mg  daily after cardioversion.     She will need transfer to tertiary care facility.

## 2023-12-15 NOTE — H&P (Addendum)
 History and Physical    Cynthia Kelley GEX:[id number] DOB: 12-Jan-1956 DOA: 12/15/2023  PCP: Anabel Halon, MD   Patient coming from: Home  I have personally briefly reviewed patient's old medical records in [facility]  Chief Complaint: Fall  HPI: Cynthia Kelley is a 69 y.o. female with medical history significant for alcohol abuse, atrial flutter, dementia, COPD, diastolic CHF.  Patient presented to the ED with complaints of a fall on the tile floor in her bathroom today.  On my evaluation, patient is awake, able to answer some questions, speech is intermittently confused.  Spouse is not present at bedside.  She drinks alcohol daily she tells me she drinks vodka.  Last drink was this morning.  Spouse told EDP- patient drinks 6 alcoholic beverages daily, he said patient had not taken her Eliquis in the past week.  She has a chronic right shoulder fracture.  ED Course: Blood pressure 92-126 systolic.  Blood alcohol level of 91.  Pelvic x-ray shows left femoral intertrochanteric fracture with severe varus angulation. Head CT without acute abnormality. Right shoulder x-ray shows markedly angulated and displaced right femoral neck fracture.  Seen on previous imaging EDP to Dr. Romeo Apple who recommended transfer to tertiary care center. EDP talked to Dr. Jena Gauss, agreed to transfer to [facility] will see in consult.  Review of Systems: As per HPI all other systems reviewed and negative.  Past Medical History:  Diagnosis Date   A-fib [facility])    Acute diastolic congestive heart failure, NYHA class 2 (HCC) 11/11/2020   Anemia 11/22/2017   Anxiety 11/19/2020   Arthritis    Asymptomatic PVD (peripheral vascular disease) (HCC) 12/14/2016   Suspected.  Hands hyperemic with decreased cap refill. ? History vasospasm   COPD with acute exacerbation (HCC) 01/14/2021   Encounter to establish care 11/19/2020   FRACTURE, TIBIAL PLATEAU 05/11/2010   Qualifier: Diagnosis of  By: Romeo Apple MD, [place]      Lung cancer, primary, with metastasis from lung to other site [facility]) 01/16/2018   Metastatic to the liver   Nuclear sclerotic cataract of both eyes 06/08/2018   Osteopenia 05/15/2013   Osteoporosis    osteopenia   Tobacco abuse 12/14/2016    Past Surgical History:  Procedure Laterality Date   FRACTURE SURGERY     torn ACL   KNEE ARTHROSCOPY       reports that she has been smoking cigarettes. She started smoking about 60 years ago. She has a 30.1 pack-year smoking history. She has been exposed to tobacco smoke. She has never used smokeless tobacco. She reports current alcohol use of about 20.0 standard drinks of alcohol per week. She reports that she does not use drugs.  No Known Allergies  Family History  Problem Relation Age of Onset   Cancer Father        died in 12's everywhere   Heart disease Mother    Cancer Mother        lung   Stroke Mother    Asthma Daughter     Prior to Admission medications   Medication Sig Start Date End Date Taking? Authorizing Provider  acetaminophen (TYLENOL) 500 MG tablet Take 1,000 mg by mouth every 6 (six) hours as needed for mild pain (pain score 1-3).   Yes [provider]  amiodarone (PACERONE) 200 MG tablet Take 200 mg by mouth daily. 02/06/21  Yes [provider]  apixaban (ELIQUIS) 5 MG TABS tablet Take 5 mg by mouth 2 (two) times daily.  09/27/20  Yes [provider]  furosemide (LASIX) 20 MG tablet Take 1 tablet (20 mg total) by mouth daily. 05/29/23  Yes Bethann Berkshire, MD  metoprolol succinate (TOPROL-XL) 25 MG 24 hr tablet Take 25 mg by mouth daily.   Yes [provider]  mirabegron ER (MYRBETRIQ) 50 MG TB24 tablet Take 1 tablet (50 mg total) by mouth daily. 12/08/23  Yes Anabel Halon, MD  mirtazapine (REMERON) 7.5 MG tablet Take 1 tablet (7.5 mg total) by mouth at bedtime. 12/08/23  Yes Anabel Halon, MD  Multiple Vitamins-Minerals (MULTIVITAMIN WITH MINERALS) tablet Take 1 tablet by mouth daily.    Yes [provider]    Physical Exam: Vitals:   12/15/23 2045 12/15/23 2100 12/15/23 2215 12/15/23 2230  BP: 126/79 99/62 92/64  (!) 102/49  Pulse: 82 89 85 80  Resp: 13 14 18 13   Temp:      TempSrc:      SpO2: 97% 94% 98% 96%    Constitutional: NAD, calm, comfortable Vitals:   12/15/23 2045 12/15/23 2100 12/15/23 2215 12/15/23 2230  BP: 126/79 99/62 92/64  (!) 102/49  Pulse: 82 89 85 80  Resp: 13 14 18 13   Temp:      TempSrc:      SpO2: 97% 94% 98% 96%   Eyes: PERRL, lids and conjunctivae normal ENMT: Mucous membranes are moist.  Neck: normal, supple, no masses, no thyromegaly Respiratory: clear to auscultation bilaterally, no wheezing, no crackles. Normal respiratory effort. No accessory muscle use.  Cardiovascular: Regular rate and rhythm, no murmurs / rubs / gallops. No extremity edema.  Extremities warm. Abdomen: no tenderness, no masses palpated. No hepatosplenomegaly. Bowel sounds positive.  Musculoskeletal: Left lower extremity shortened and internally rotated no clubbing / cyanosis. No other joint deformity upper and lower extremities.  Skin: no rashes, lesions, ulcers. No induration Neurologic: No facial asymmetry, moving extremity spontaneously, speech fluent. Psychiatric: Normal judgment and insight.  Alert and oriented to person and place , not oriented to time.    Labs on Admission: I have personally reviewed following labs and imaging studies  CBC: Recent Labs  Lab 12/15/23 1847  WBC 8.3  NEUTROABS 5.7  HGB 13.7  HCT 41.0  MCV 98.3  PLT 296   Basic Metabolic Panel: Recent Labs  Lab 12/15/23 1917  NA 132*  K 3.9  CL 98  CO2 22  GLUCOSE 90  BUN 25*  CREATININE 0.63  CALCIUM 9.2   GFR: Estimated Creatinine Clearance: 56.6 mL/min (by C-G formula based on SCr of 0.63 mg/dL). Liver Function Tests: Recent Labs  Lab 12/15/23 1917  AST 37  ALT 27  ALKPHOS 90  BILITOT 0.5  PROT 7.1  ALBUMIN 3.9   Coagulation Profile: Recent Labs   Lab 12/15/23 2107  INR 1.0   Radiological Exams on Admission: CT Head Wo Contrast Result Date: 12/15/2023 CLINICAL DATA:  Larey Seat EXAM: CT HEAD WITHOUT CONTRAST TECHNIQUE: Contiguous axial images were obtained from the base of the skull through the vertex without intravenous contrast. RADIATION DOSE REDUCTION: This exam was performed according to the departmental dose-optimization program which includes automated exposure control, adjustment of the mA and/or kV according to patient size and/or use of iterative reconstruction technique. COMPARISON:  02/27/2022 FINDINGS: Brain: Stable scattered hypodensities throughout the periventricular white matter consistent with chronic small vessel ischemic changes. No evidence of acute infarct or hemorrhage. The lateral ventricles and midline structures are unremarkable. No acute extra-axial fluid collections. No mass effect. Vascular: No hyperdense vessel  or unexpected calcification. Skull: Normal. Negative for fracture or focal lesion. Sinuses/Orbits: No acute finding. Other: None. IMPRESSION: 1. Stable head CT, no acute intracranial process. Electronically Signed   By: Sharlet Salina M.D.   On: 12/15/2023 21:34   DG Shoulder Right Result Date: 12/15/2023 CLINICAL DATA:  Fall, pain EXAM: RIGHT SHOULDER - 2+ VIEW COMPARISON:  None Available. FINDINGS: There is a left humeral neck fracture with marked angulation and displacement. No definite dislocation. AC joint is intact. IMPRESSION: Markedly angulated and displaced right humeral neck fracture. Electronically Signed   By: Charlett Nose M.D.   On: 12/15/2023 19:24   DG Chest 1 View Result Date: 12/15/2023 CLINICAL DATA:  Fall, left hip fracture EXAM: CHEST  1 VIEW COMPARISON:  09/04/2022 FINDINGS: Right Port-A-Cath remains in place, unchanged. Heart and mediastinal contours are within normal limits. Aortic atherosclerosis. No confluent airspace opacities, effusions or edema. No acute bony abnormality. Old healed left  rib fracture. IMPRESSION: No active disease. Electronically Signed   By: Charlett Nose M.D.   On: 12/15/2023 19:23   DG Hip Unilat W or Wo Pelvis 2-3 Views Left Result Date: 12/15/2023 CLINICAL DATA:  Fall.  Left hip pain.  Left leg shortening. EXAM: DG HIP (WITH OR WITHOUT PELVIS) 2-3V LEFT COMPARISON:  None Available. FINDINGS: There is a left femoral intertrochanteric fracture with severe angulation. No subluxation or dislocation. Hip joints and SI joints symmetric and unremarkable. IMPRESSION: Left femoral intertrochanteric fracture with severe varus angulation. Electronically Signed   By: Charlett Nose M.D.   On: 12/15/2023 19:21    EKG: Independently reviewed.  Atrial flutter, rate 78, QTc 491.  No significant change from prior.  Assessment/Plan Principal Problem:   Closed left femoral fracture (HCC) Active Problems:   Alcohol intoxication (HCC)   Lung cancer, primary, with metastasis from lung to other site [facility])   Chronic diastolic CHF (congestive heart failure) (HCC)   Dementia with behavioral disturbance (HCC)   Paroxysmal atrial fibrillation (HCC)   Assessment and Plan: * Closed left femoral fracture (HCC) Status post fall today, patient intoxicated with alcohol.  Pelvic x-ray-  Left femoral intertrochanteric fracture with severe varus angulation.  Head CT negative for acute abnormality.  She has chronic right femur fracture , sustained from a fall, that has been present since 6/82023, per notes patient and spouse opted for nonoperative management. - EDP to Dr. Jena Gauss, will see in consult, -N.p.o. midnight -IV morphine 2 mg every 4 hours as needed - bolus given, continue N/s 75cc/hr x 20 hrs  Alcohol intoxication (HCC) Blood alcohol level of 91.  Patient presented with fall.  She is on anticoagulation with Eliquis.  She drinks 6 alcoholic beverages daily, she tells me she drinks vodka.  Last drink was this morning.  No sign of withdrawal at this time. -CIWA as  needed -Thiamine folate multivitamins -Potassium 3.9, normal Phos-2.8 and mag-1.9  Paroxysmal atrial fibrillation (HCC) EKG shows atrial flutter.  Unable to confirm with spouse exactly when patient took her last dose of Eliquis.  Per EDP patient spouse reported patient had not taking her Eliquis in the past week. -Resume amiodarone, metoprolol -Hold Eliquis - Need to talk to family about risk versus benefit of anticoagulation in this patient with history of alcohol abuse, intoxication and falls.  Chronic diastolic CHF (congestive heart failure) (HCC) Stable and compensated.  Last echo 2022 EF of 55 to 60% grade 2 DD. -Hold Lasix 20 mg daily to allow for  hydration.  Lung cancer,  primary, with metastasis from lung to other site [facility]) Follow[facility]ology [facility]  Last notes 09/28/2022-no evidence of disease recurrence at that time, patient was to follow-up for ongoing surveillance.   DVT prophylaxis: SCDS Code Status: FULL Code-ACP documents reviewed.  Patient inebriated at this time Family Communication: None at bedside.   I am unable to reach family at this time. Disposition Plan:  > 2 days Consults called: Ortho Surg Admission status: Inpt  Med surg I certify that at the point of admission it is my clinical judgment that the patient will require inpatient hospital care spanning beyond 2 midnights from the point of admission due to high intensity of service, high risk for further deterioration and high frequency of surveillance required.    Author: Onnie Boer, MD 12/15/2023 11:18 PM  For on call review [email or web address].

## 2023-12-15 NOTE — Assessment & Plan Note (Addendum)
 Status post fall today, patient intoxicated with alcohol.  Pelvic x-ray-  Left femoral intertrochanteric fracture with severe varus angulation.  Head CT negative for acute abnormality.  She has chronic right femur fracture , sustained from a fall, that has been present since 6/82023, per notes patient and spouse opted for nonoperative management. - EDP to Dr. Jena Gauss, will see in consult, -N.p.o. midnight -IV morphine 2 mg every 4 hours as needed - bolus given, continue N/s 75cc/hr x 20 hrs

## 2023-12-15 NOTE — Assessment & Plan Note (Signed)
 Follows with oncology at [facility].  Last notes 09/28/2022-no evidence of disease recurrence at that time, patient was to follow-up for ongoing surveillance.

## 2023-12-15 NOTE — Assessment & Plan Note (Addendum)
 EKG shows atrial flutter.  Unable to confirm with spouse exactly when patient took her last dose of Eliquis.  Per EDP patient spouse reported patient had not taking her Eliquis in the past week. -Resume amiodarone, metoprolol -Hold Eliquis - Need to talk to family about risk versus benefit of anticoagulation in this patient with history of alcohol abuse, intoxication and falls.

## 2023-12-15 NOTE — ED Notes (Signed)
 Patient states she needs to void. Patient medicated and purwik set up.

## 2023-12-15 NOTE — ED Notes (Signed)
 Fall at home, left hip obvious deformity, appears internally rotated from left hip to left foot. Pedal pulses equal and strong +3. Cap refill <3 seconds to left foot. Patient able to wiggle left foot and toes. Bilateral extremity sensation equal and intact. Patient declines N/T or any other complaints. Pain 5/10 after morphine via EMS. Patient declines any further pain medications at this time. A+Ox4. VSS.

## 2023-12-15 NOTE — ED Provider Notes (Signed)
 [place] EMERGENCY DEPARTMENT AT [facility] Provider Note   CSN: [id number] Arrival date & time: 12/15/23  1813     History  Chief Complaint  Patient presents with   Marletta Lor    Cynthia Kelley is a 68 y.o. female.  Patient is a 68 year old female who presents emergency department the chief complaint of left hip pain.  Patient notes that she had a fall in her bathroom just prior to arrival.  Patient denies striking her head or injuring her neck or back during the fall.  Patient is supposed to be on Eliquis but husband notes that she has not taken it for the past week.  Patient denies any other long bone or joint pain at this time.  Patient does have a known chronic fracture of her right proximal humerus.  Patient denies any associated numbness, paresthesias or unilateral weakness.  She denies any chest pain or abdominal pain.   Fall       Home Medications Prior to Admission medications   Medication Sig Start Date End Date Taking? Authorizing Provider  amiodarone (PACERONE) 200 MG tablet Take 200 mg by mouth daily. 02/06/21   [provider]  apixaban (ELIQUIS) 5 MG TABS tablet Take 5 mg by mouth 2 (two) times daily. 09/27/20   [provider]  furosemide (LASIX) 20 MG tablet Take 1 tablet (20 mg total) by mouth daily. 05/29/23   Bethann Berkshire, MD  metoprolol succinate (TOPROL-XL) 25 MG 24 hr tablet Take 25 mg by mouth daily.    [provider]  mirabegron ER (MYRBETRIQ) 50 MG TB24 tablet Take 1 tablet (50 mg total) by mouth daily. 12/08/23   Anabel Halon, MD  mirtazapine (REMERON) 7.5 MG tablet Take 1 tablet (7.5 mg total) by mouth at bedtime. 12/08/23   Anabel Halon, MD  Multiple Vitamins-Minerals (MULTIVITAMIN WITH MINERALS) tablet Take 1 tablet by mouth daily.    [provider]  polyethylene glycol (MIRALAX / GLYCOLAX) 17 g packet Take 17 g by mouth daily. 03/05/22   Erick Blinks, MD      Allergies    Patient has no known allergies.     Review of Systems   Review of Systems  Musculoskeletal:        Left hip pain  All other systems reviewed and are negative.   Physical Exam Updated Vital Signs BP 116/68   Pulse 79   Temp 98.2 F (36.8 C) (Oral)   Resp 18   SpO2 95%  Physical Exam Vitals and nursing note reviewed.  Constitutional:      Appearance: Normal appearance.  HENT:     Head: Normocephalic and atraumatic.     Nose: Nose normal.     Mouth/Throat:     Mouth: Mucous membranes are moist.  Eyes:     Extraocular Movements: Extraocular movements intact.     Conjunctiva/sclera: Conjunctivae normal.     Pupils: Pupils are equal, round, and reactive to light.  Cardiovascular:     Rate and Rhythm: Normal rate and regular rhythm.     Pulses: Normal pulses.     Heart sounds: Normal heart sounds. No murmur heard. Pulmonary:     Effort: Pulmonary effort is normal. No respiratory distress.     Breath sounds: Normal breath sounds. No stridor. No wheezing or rales.  Abdominal:     General: Abdomen is flat. Bowel sounds are normal. There is no distension.     Palpations: Abdomen is soft.  Tenderness: There is no abdominal tenderness. There is no guarding.  Musculoskeletal:        General: Normal range of motion.     Cervical back: Normal range of motion and neck supple.     Comments: Tenderness palpation noted over the left hip with noted edema, nontender palpation of the left knee, left ankle, nontender palpation of right lower extremity throughout, pelvis stable to AP lateral compression, left lower extremity is shortened and internally rotated, DP and PT pulses are 2+ distally, sensation is intact distally  Skin:    General: Skin is warm and dry.  Neurological:     General: No focal deficit present.     Mental Status: She is alert and oriented to person, place, and time. Mental status is at baseline.  Psychiatric:        Mood and Affect: Mood normal.        Behavior: Behavior normal.        Thought  Content: Thought content normal.        Judgment: Judgment normal.     ED Results / Procedures / Treatments   Labs (all labs ordered are listed, but only abnormal results are displayed) Labs Reviewed  COMPREHENSIVE METABOLIC PANEL  CBC WITH DIFFERENTIAL/PLATELET  PROTIME-INR  ETHANOL  TROPONIN I (HIGH SENSITIVITY)    EKG None  Radiology No results found.  Procedures Procedures    Medications Ordered in ED Medications - No data to display  ED Course/ Medical Decision Making/ A&P                                 Medical Decision Making Amount and/or Complexity of Data Reviewed Labs: ordered. Radiology: ordered.  Risk Prescription drug management. Decision regarding hospitalization.   This patient presents to the ED for concern of left hip pain, this involves an extensive number of treatment options, and is a complaint that carries with it a high risk of complications and morbidity.  The differential diagnosis includes fracture, sprain, contusion, sepsis, CVA, TIA, intracranial hemorrhage   Co morbidities that complicate the patient evaluation  Metastatic cancer   Additional history obtained:  Additional history obtained from husband External records from outside source obtained and reviewed including records   Lab Tests:  I Ordered, and personally interpreted labs.  The pertinent results include: Elevated alcohol, low sodium, normal kidney function liver function, no leukocytosis, no anemia, normal platelets   Imaging Studies ordered:  I ordered imaging studies including x-ray of right shoulder, chest, left hip, CT scan of head I independently visualized and interpreted imaging which showed left intertrochanteric hip fracture, chronic fracture of the right proximal humerus, no acute cardiopulmonary process, no acute intracranial hemorrhage I agree with the radiologist interpretation   Cardiac Monitoring: / EKG:  The patient was maintained on a  cardiac monitor.  I personally viewed and interpreted the cardiac monitored which showed an underlying rhythm of: Atrial flutter, no ST/T wave changes, no ischemic changes, no STEMI   Consultations Obtained:  I requested consultation with the orthopedics,  and discussed lab and imaging findings as well as pertinent plan - they recommend: Admission   Problem List / ED Course / Critical interventions / Medication management  Patient is doing well at this time and does remain stable.  Discussed with patient we will plan for admission to hospital service for her left hip fracture.  Did initially discuss patient case with Dr. Romeo Apple  with orthopedics who notes that patient does appear to have a pathologic left hip fracture and did recommend transfer.  Did discuss patient case with Dr. Jena Gauss with orthopedics who did recommend transfer to [facility].  Patient case was then discussed with Dr. Wendall Stade who has accepted for admission. I ordered medication including morphine, IV fluids, Zofran for left hip fracture Reevaluation of the patient after these medicines showed that the patient improved I have reviewed the patients home medicines and have made adjustments as needed   Social Determinants of Health:  None   Test / Admission - Considered:  Admission        Final Clinical Impression(s) / ED Diagnoses Final diagnoses:  None    Rx / DC Orders ED Discharge Orders     None         Kathlen Mody 12/15/23 2239    Bethann Berkshire, MD 12/16/23 [phone]

## 2023-12-15 NOTE — Assessment & Plan Note (Addendum)
 Stable and compensated.  Last echo 2022 EF of 55 to 60% grade 2 DD. -Hold Lasix 20 mg daily to allow for  hydration.

## 2023-12-16 ENCOUNTER — Inpatient Hospital Stay (HOSPITAL_COMMUNITY): Admitting: Anesthesiology

## 2023-12-16 ENCOUNTER — Encounter (HOSPITAL_COMMUNITY)
Admission: EM | Disposition: A | Discharge status: Skilled Nursing Facility | Payer: Self-pay | Source: Home / Self Care | Attending: Internal Medicine

## 2023-12-16 ENCOUNTER — Encounter (HOSPITAL_COMMUNITY): Payer: Self-pay | Admitting: Internal Medicine

## 2023-12-16 ENCOUNTER — Inpatient Hospital Stay (HOSPITAL_COMMUNITY)

## 2023-12-16 ENCOUNTER — Other Ambulatory Visit: Payer: Self-pay

## 2023-12-16 DIAGNOSIS — E119 Type 2 diabetes mellitus without complications: Secondary | ICD-10-CM

## 2023-12-16 DIAGNOSIS — S72142A Displaced intertrochanteric fracture of left femur, initial encounter for closed fracture: Secondary | ICD-10-CM | POA: Diagnosis not present

## 2023-12-16 DIAGNOSIS — S72002A Fracture of unspecified part of neck of left femur, initial encounter for closed fracture: Secondary | ICD-10-CM | POA: Diagnosis not present

## 2023-12-16 DIAGNOSIS — I5032 Chronic diastolic (congestive) heart failure: Secondary | ICD-10-CM | POA: Diagnosis not present

## 2023-12-16 DIAGNOSIS — F10920 Alcohol use, unspecified with intoxication, uncomplicated: Secondary | ICD-10-CM | POA: Diagnosis not present

## 2023-12-16 DIAGNOSIS — I4891 Unspecified atrial fibrillation: Secondary | ICD-10-CM | POA: Diagnosis not present

## 2023-12-16 DIAGNOSIS — F03918 Unspecified dementia, unspecified severity, with other behavioral disturbance: Secondary | ICD-10-CM | POA: Diagnosis not present

## 2023-12-16 DIAGNOSIS — I509 Heart failure, unspecified: Secondary | ICD-10-CM | POA: Diagnosis not present

## 2023-12-16 HISTORY — PX: INTRAMEDULLARY (IM) NAIL INTERTROCHANTERIC: SHX5875

## 2023-12-16 LAB — HIV ANTIBODY (ROUTINE TESTING W REFLEX): HIV Screen 4th Generation wRfx: NONREACTIVE

## 2023-12-16 LAB — BASIC METABOLIC PANEL
Anion gap: 7 (ref 5–15)
BUN: 20 mg/dL (ref 8–23)
CO2: 23 mmol/L (ref 22–32)
Calcium: 7.4 mg/dL — ABNORMAL LOW (ref 8.9–10.3)
Chloride: 103 mmol/L (ref 98–111)
Creatinine, Ser: 0.55 mg/dL (ref 0.44–1.00)
GFR, Estimated: >60 mL/min (ref >60)
Glucose, Bld: 103 mg/dL — ABNORMAL HIGH (ref 70–99)
Potassium: 4 mmol/L (ref 3.5–5.1)
Sodium: 133 mmol/L — ABNORMAL LOW (ref 135–145)

## 2023-12-16 LAB — CBC
HCT: 35.3 % — ABNORMAL LOW (ref 36.0–46.0)
Hemoglobin: 11.6 g/dL — ABNORMAL LOW (ref 12.0–15.0)
MCH: 32.3 pg (ref 26.0–34.0)
MCHC: 32.9 g/dL (ref 30.0–36.0)
MCV: 98.3 fL (ref 80.0–100.0)
Platelets: 267 10*3/uL (ref 150–400)
RBC: 3.59 MIL/uL — ABNORMAL LOW (ref 3.87–5.11)
RDW: 13.6 % (ref 11.5–15.5)
WBC: 8 10*3/uL (ref 4.0–10.5)
nRBC: 0 % (ref 0.0–0.2)

## 2023-12-16 SURGERY — FIXATION, FRACTURE, INTERTROCHANTERIC, WITH INTRAMEDULLARY ROD
Anesthesia: General | Site: Leg Upper | Laterality: Left

## 2023-12-16 MED ORDER — SUGAMMADEX SODIUM 200 MG/2ML IV SOLN
INTRAVENOUS | Status: DC | PRN
Start: 2023-12-16 — End: 2023-12-16
  Administered 2023-12-16: 400 mg via INTRAVENOUS

## 2023-12-16 MED ORDER — FENTANYL CITRATE (PF) 250 MCG/5ML IJ SOLN
INTRAMUSCULAR | Status: DC | PRN
  Administered 2023-12-16 (×3): 50 ug via INTRAVENOUS

## 2023-12-16 MED ORDER — ACETAMINOPHEN 10 MG/ML IV SOLN: 1000.0000 mg | Freq: Once | INTRAVENOUS | Status: DC | PRN

## 2023-12-16 MED ORDER — PROPOFOL 10 MG/ML IV BOLUS
INTRAVENOUS | Status: AC
  Filled 2023-12-16: qty 20

## 2023-12-16 MED ORDER — HYDRALAZINE HCL 10 MG PO TABS: 10.0000 mg | ORAL_TABLET | Freq: Four times a day (QID) | ORAL | Status: DC | PRN

## 2023-12-16 MED ORDER — EPHEDRINE 5 MG/ML INJ
INTRAVENOUS | Status: AC
  Filled 2023-12-16: qty 5

## 2023-12-16 MED ORDER — OXYCODONE HCL 5 MG PO TABS
5.0000 mg | ORAL_TABLET | ORAL | Status: DC | PRN
  Administered 2023-12-18: 5 mg via ORAL
  Administered 2023-12-18: 10 mg via ORAL
  Administered 2023-12-19: 5 mg via ORAL
  Administered 2023-12-19: 10 mg via ORAL
  Administered 2023-12-19: 5 mg via ORAL
  Administered 2023-12-20: 10 mg via ORAL
  Administered 2023-12-20: 5 mg via ORAL
  Filled 2023-12-16: qty 1
  Filled 2023-12-16 (×3): qty 2
  Filled 2023-12-16 (×2): qty 1
  Filled 2023-12-16: qty 2
  Filled 2023-12-16: qty 1

## 2023-12-16 MED ORDER — SUGAMMADEX SODIUM 200 MG/2ML IV SOLN
INTRAVENOUS | Status: AC
  Filled 2023-12-16: qty 2

## 2023-12-16 MED ORDER — DEXAMETHASONE SODIUM PHOSPHATE 10 MG/ML IJ SOLN
INTRAMUSCULAR | Status: AC
  Filled 2023-12-16: qty 1

## 2023-12-16 MED ORDER — METOCLOPRAMIDE HCL 5 MG PO TABS: 5.0000 mg | ORAL_TABLET | Freq: Three times a day (TID) | ORAL | Status: DC | PRN

## 2023-12-16 MED ORDER — ORAL CARE MOUTH RINSE: 15.0000 mL | Freq: Once | OROMUCOSAL | Status: DC

## 2023-12-16 MED ORDER — EPHEDRINE SULFATE-NACL 50-0.9 MG/10ML-% IV SOSY
PREFILLED_SYRINGE | INTRAVENOUS | Status: DC | PRN
Start: 2023-12-16 — End: 2023-12-16
  Administered 2023-12-16 (×5): 5 mg via INTRAVENOUS

## 2023-12-16 MED ORDER — KETOROLAC TROMETHAMINE 15 MG/ML IJ SOLN
15.0000 mg | Freq: Once | INTRAMUSCULAR | Status: AC
  Administered 2023-12-16: 15 mg via INTRAVENOUS
  Filled 2023-12-16: qty 1

## 2023-12-16 MED ORDER — METHOCARBAMOL 500 MG PO TABS
500.0000 mg | ORAL_TABLET | Freq: Four times a day (QID) | ORAL | Status: DC | PRN
  Administered 2023-12-19 – 2023-12-25 (×12): 500 mg via ORAL
  Filled 2023-12-16 (×12): qty 1

## 2023-12-16 MED ORDER — CEFAZOLIN SODIUM-DEXTROSE 2-3 GM-%(50ML) IV SOLR
INTRAVENOUS | Status: DC | PRN
  Administered 2023-12-16: 2 g via INTRAVENOUS

## 2023-12-16 MED ORDER — CEFAZOLIN SODIUM-DEXTROSE 2-4 GM/100ML-% IV SOLN
2.0000 g | Freq: Three times a day (TID) | INTRAVENOUS | Status: AC
  Administered 2023-12-16 – 2023-12-17 (×3): 2 g via INTRAVENOUS
  Filled 2023-12-16 (×3): qty 100

## 2023-12-16 MED ORDER — ROCURONIUM BROMIDE 10 MG/ML (PF) SYRINGE
PREFILLED_SYRINGE | INTRAVENOUS | Status: AC
  Filled 2023-12-16: qty 10

## 2023-12-16 MED ORDER — ONDANSETRON HCL 4 MG/2ML IJ SOLN
INTRAMUSCULAR | Status: AC
  Filled 2023-12-16: qty 2

## 2023-12-16 MED ORDER — CEFAZOLIN SODIUM-DEXTROSE 2-4 GM/100ML-% IV SOLN
INTRAVENOUS | Status: AC
  Filled 2023-12-16: qty 100

## 2023-12-16 MED ORDER — FENTANYL CITRATE (PF) 100 MCG/2ML IJ SOLN
25.0000 ug | INTRAMUSCULAR | Status: DC | PRN
  Administered 2023-12-16: 25 ug via INTRAVENOUS

## 2023-12-16 MED ORDER — HYDROMORPHONE HCL 1 MG/ML IJ SOLN
0.5000 mg | INTRAMUSCULAR | Status: DC | PRN
Start: 2023-12-16 — End: 2023-12-22
  Administered 2023-12-20: 1 mg via INTRAVENOUS
  Filled 2023-12-16: qty 1

## 2023-12-16 MED ORDER — FENTANYL CITRATE (PF) 250 MCG/5ML IJ SOLN
INTRAMUSCULAR | Status: AC
  Filled 2023-12-16: qty 5

## 2023-12-16 MED ORDER — 0.9 % SODIUM CHLORIDE (POUR BTL) OPTIME
TOPICAL | Status: DC | PRN
  Administered 2023-12-16: 1000 mL

## 2023-12-16 MED ORDER — LACTATED RINGERS IV SOLN: INTRAVENOUS | Status: DC | PRN

## 2023-12-16 MED ORDER — TRANEXAMIC ACID-NACL 1000-0.7 MG/100ML-% IV SOLN
1000.0000 mg | Freq: Once | INTRAVENOUS | Status: AC
  Administered 2023-12-16: 1000 mg via INTRAVENOUS
  Filled 2023-12-16: qty 100

## 2023-12-16 MED ORDER — METHOCARBAMOL 1000 MG/10ML IJ SOLN: 500.0000 mg | Freq: Four times a day (QID) | INTRAMUSCULAR | Status: DC | PRN

## 2023-12-16 MED ORDER — ROCURONIUM BROMIDE 10 MG/ML (PF) SYRINGE
PREFILLED_SYRINGE | INTRAVENOUS | Status: DC | PRN
  Administered 2023-12-16: 60 mg via INTRAVENOUS

## 2023-12-16 MED ORDER — PHENYLEPHRINE 80 MCG/ML (10ML) SYRINGE FOR IV PUSH (FOR BLOOD PRESSURE SUPPORT)
PREFILLED_SYRINGE | INTRAVENOUS | Status: DC | PRN
  Administered 2023-12-16 (×5): 80 ug via INTRAVENOUS

## 2023-12-16 MED ORDER — LIDOCAINE 2% (20 MG/ML) 5 ML SYRINGE
INTRAMUSCULAR | Status: DC | PRN
  Administered 2023-12-16: 100 mg via INTRAVENOUS

## 2023-12-16 MED ORDER — LACTATED RINGERS IV SOLN: INTRAVENOUS | Status: AC

## 2023-12-16 MED ORDER — CHLORHEXIDINE GLUCONATE 0.12 % MT SOLN: 15.0000 mL | Freq: Once | OROMUCOSAL | Status: DC

## 2023-12-16 MED ORDER — PHENYLEPHRINE HCL-NACL 20-0.9 MG/250ML-% IV SOLN
INTRAVENOUS | Status: DC | PRN
  Administered 2023-12-16: 50 ug/min via INTRAVENOUS

## 2023-12-16 MED ORDER — DEXAMETHASONE SODIUM PHOSPHATE 10 MG/ML IJ SOLN
INTRAMUSCULAR | Status: DC | PRN
  Administered 2023-12-16: 5 mg via INTRAVENOUS

## 2023-12-16 MED ORDER — DOCUSATE SODIUM 100 MG PO CAPS
100.0000 mg | ORAL_CAPSULE | Freq: Two times a day (BID) | ORAL | Status: DC
  Administered 2023-12-16 – 2023-12-25 (×19): 100 mg via ORAL
  Filled 2023-12-16 (×20): qty 1

## 2023-12-16 MED ORDER — ONDANSETRON HCL 4 MG/2ML IJ SOLN: 4.0000 mg | Freq: Once | INTRAMUSCULAR | Status: DC | PRN

## 2023-12-16 MED ORDER — PROPOFOL 10 MG/ML IV BOLUS
INTRAVENOUS | Status: DC | PRN
Start: 2023-12-16 — End: 2023-12-16
  Administered 2023-12-16: 100 mg via INTRAVENOUS

## 2023-12-16 MED ORDER — FENTANYL CITRATE (PF) 100 MCG/2ML IJ SOLN
INTRAMUSCULAR | Status: AC
  Filled 2023-12-16: qty 2

## 2023-12-16 MED ORDER — LIDOCAINE 2% (20 MG/ML) 5 ML SYRINGE
INTRAMUSCULAR | Status: AC
  Filled 2023-12-16: qty 5

## 2023-12-16 MED ORDER — DIPHENHYDRAMINE HCL 12.5 MG/5ML PO ELIX: 12.5000 mg | ORAL_SOLUTION | ORAL | Status: DC | PRN

## 2023-12-16 MED ORDER — MIDAZOLAM HCL 2 MG/2ML IJ SOLN
INTRAMUSCULAR | Status: AC
  Filled 2023-12-16: qty 2

## 2023-12-16 MED ORDER — CHLORHEXIDINE GLUCONATE 0.12 % MT SOLN
OROMUCOSAL | Status: AC
  Filled 2023-12-16: qty 15

## 2023-12-16 MED ORDER — ALBUMIN HUMAN 5 % IV SOLN: INTRAVENOUS | Status: DC | PRN

## 2023-12-16 MED ORDER — PHENYLEPHRINE 80 MCG/ML (10ML) SYRINGE FOR IV PUSH (FOR BLOOD PRESSURE SUPPORT)
PREFILLED_SYRINGE | INTRAVENOUS | Status: AC
  Filled 2023-12-16: qty 10

## 2023-12-16 MED ORDER — ACETAMINOPHEN 650 MG RE SUPP
650.0000 mg | Freq: Four times a day (QID) | RECTAL | Status: DC
  Filled 2023-12-16: qty 1

## 2023-12-16 MED ORDER — ACETAMINOPHEN 325 MG PO TABS
650.0000 mg | ORAL_TABLET | Freq: Four times a day (QID) | ORAL | Status: DC
Start: 2023-12-16 — End: 2023-12-25
  Administered 2023-12-16 – 2023-12-25 (×31): 650 mg via ORAL
  Filled 2023-12-16 (×33): qty 2

## 2023-12-16 MED ORDER — METOCLOPRAMIDE HCL 5 MG/ML IJ SOLN: 5.0000 mg | Freq: Three times a day (TID) | INTRAMUSCULAR | Status: DC | PRN

## 2023-12-16 MED ORDER — ONDANSETRON HCL 4 MG/2ML IJ SOLN
INTRAMUSCULAR | Status: DC | PRN
  Administered 2023-12-16: 4 mg via INTRAVENOUS

## 2023-12-16 MED ORDER — MIDAZOLAM HCL 2 MG/2ML IJ SOLN
INTRAMUSCULAR | Status: DC | PRN
  Administered 2023-12-16: 2 mg via INTRAVENOUS

## 2023-12-16 SURGICAL SUPPLY — 46 items
BAG COUNTER SPONGE SURGICOUNT (BAG) IMPLANT
BIT DRILL INTERTAN LAG SCREW (BIT) IMPLANT
BIT DRILL SHORT 4.0 (BIT) IMPLANT
BRUSH SCRUB EZ PLAIN DRY (MISCELLANEOUS) ×2 IMPLANT
CHLORAPREP W/TINT 26 (MISCELLANEOUS) ×1 IMPLANT
COVER PERINEAL POST (MISCELLANEOUS) ×1 IMPLANT
COVER SURGICAL LIGHT HANDLE (MISCELLANEOUS) ×1 IMPLANT
DERMABOND ADVANCED .7 DNX12 (GAUZE/BANDAGES/DRESSINGS) ×1 IMPLANT
DRAPE C-ARM 35X43 STRL (DRAPES) ×1 IMPLANT
DRAPE IMP U-DRAPE 54X76 (DRAPES) ×2 IMPLANT
DRAPE INCISE IOBAN 66X45 STRL (DRAPES) ×1 IMPLANT
DRAPE STERI IOBAN 125X83 (DRAPES) ×1 IMPLANT
DRAPE SURG 17X23 STRL (DRAPES) ×2 IMPLANT
DRAPE U-SHAPE 47X51 STRL (DRAPES) ×1 IMPLANT
DRESSING MEPILEX FLEX 4X4 (GAUZE/BANDAGES/DRESSINGS) ×1 IMPLANT
DRSG MEPILEX FLEX 4X4 (GAUZE/BANDAGES/DRESSINGS) ×3 IMPLANT
DRSG MEPILEX POST OP 4X8 (GAUZE/BANDAGES/DRESSINGS) ×1 IMPLANT
DRSG TEGADERM 4X4.75 (GAUZE/BANDAGES/DRESSINGS) IMPLANT
ELECT REM PT RETURN 9FT ADLT (ELECTROSURGICAL) ×1 IMPLANT
ELECTRODE REM PT RTRN 9FT ADLT (ELECTROSURGICAL) ×1 IMPLANT
GAUZE SPONGE 4X4 12PLY STRL (GAUZE/BANDAGES/DRESSINGS) IMPLANT
GLOVE BIO SURGEON STRL SZ 6.5 (GLOVE) ×3 IMPLANT
GLOVE BIO SURGEON STRL SZ7.5 (GLOVE) ×4 IMPLANT
GLOVE BIOGEL PI IND STRL 6.5 (GLOVE) ×1 IMPLANT
GLOVE BIOGEL PI IND STRL 7.5 (GLOVE) ×1 IMPLANT
GOWN STRL REUS W/ TWL LRG LVL3 (GOWN DISPOSABLE) ×1 IMPLANT
GUIDE PIN 3.2X343 (PIN) ×2 IMPLANT
GUIDE ROD 3.0 (MISCELLANEOUS) ×1 IMPLANT
KIT BASIN OR (CUSTOM PROCEDURE TRAY) ×1 IMPLANT
KIT TURNOVER KIT B (KITS) ×1 IMPLANT
MANIFOLD NEPTUNE II (INSTRUMENTS) ×1 IMPLANT
NAIL LOCK CANN 10X340 130D LT (Nail) IMPLANT
NS IRRIG 1000ML POUR BTL (IV SOLUTION) ×1 IMPLANT
PACK GENERAL/GYN (CUSTOM PROCEDURE TRAY) ×1 IMPLANT
PAD ARMBOARD POSITIONER FOAM (MISCELLANEOUS) ×2 IMPLANT
PIN GUIDE 3.2X343MM (PIN) IMPLANT
ROD GUIDE 3.0 (MISCELLANEOUS) IMPLANT
SCREW LAG COMPR KIT 95/90 (Screw) IMPLANT
SCREW TRIGEN LOW PROF 5.0X37.5 (Screw) IMPLANT
SUT MNCRL AB 3-0 PS2 18 (SUTURE) ×1 IMPLANT
SUT MNCRL AB 3-0 PS2 27 (SUTURE) IMPLANT
SUT MON AB 2-0 CT1 36 (SUTURE) IMPLANT
SUT VIC AB 0 CT1 27XBRD ANBCTR (SUTURE) IMPLANT
SUT VIC AB 2-0 CT1 TAPERPNT 27 (SUTURE) ×2 IMPLANT
TOWEL GREEN STERILE (TOWEL DISPOSABLE) ×2 IMPLANT
WATER STERILE IRR 1000ML POUR (IV SOLUTION) ×1 IMPLANT

## 2023-12-16 NOTE — Interval H&P Note (Signed)
 History and Physical Interval Note:  12/16/2023 11:20 AM  Cynthia Kelley  has presented today for surgery, with the diagnosis of Left intertrochanteric femur fracture.  The various methods of treatment have been discussed with the patient and family. After consideration of risks, benefits and other options for treatment, the patient has consented to  Procedure(s): FIXATION, FRACTURE, INTERTROCHANTERIC, WITH INTRAMEDULLARY ROD (Left) as a surgical intervention.  The patient's history has been reviewed, patient examined, no change in status, stable for surgery.  I have reviewed the patient's chart and labs.  Questions were answered to the patient's satisfaction.     Caryn Bee P Hye Trawick

## 2023-12-16 NOTE — Transfer of Care (Signed)
 Immediate Anesthesia Transfer of Care Note  Patient: Cynthia Kelley  Procedure(s) Performed: FIXATION, FRACTURE, INTERTROCHANTERIC, WITH INTRAMEDULLARY ROD (Left: Leg Upper)  Patient Location: PACU  Anesthesia Type:General  Level of Consciousness: awake, alert , patient cooperative, and responds to stimulation  Airway & Oxygen Therapy: Patient Spontanous Breathing and Patient connected to face mask oxygen  Post-op Assessment: Report given to RN and Post -op Vital signs reviewed and stable  Post vital signs: Reviewed and stable  Last Vitals:  Vitals Value Taken Time  BP 123/71 12/16/23 1317  Temp    Pulse    Resp 18 12/16/23 1325  SpO2    Vitals shown include unfiled device data.  Last Pain:  Vitals:   12/16/23 1053  TempSrc: Oral  PainSc:       Patients Stated Pain Goal: 3 (12/15/23 1824)  Complications: No notable events documented.

## 2023-12-16 NOTE — H&P (View-Only) (Signed)
 Orthopaedic Trauma Service (OTS) Consult   Patient ID: Cynthia Kelley MRN: [id number] DOB/AGE: 70-Jun-1957 70 y.o.  Reason for Consult:Left proximal femur fracture Referring Physician: Dr. Fuller Canada, MD  HPI: Cynthia Kelley is an 68 y.o. female who is being seen in consultation at the request of Dr. Romeo Apple for evaluation of left proximal femur fracture.  Patient had a ground-level fall.  She has a history of dementia COPD and congestive heart failure.  She also has a previous history of lung cancer.  On x-ray she was found to have a left proximal femur fracture with lytic lesions.  Due to complexity of the injury Dr. Romeo Apple felt that the best by transferring to a tertiary level care center.  I was contacted by the emergency room.  Patient was seen and evaluated.  She ambulates without assist device.  Her husband is driving in and I talked to him over the phone.  Denies any other injuries.  She has a chronic proximal humerus fracture.  Past Medical History:  Diagnosis Date   A-fib [facility])    Acute diastolic congestive heart failure, NYHA class 2 (HCC) 11/11/2020   Anemia 11/22/2017   Anxiety 11/19/2020   Arthritis    Asymptomatic PVD (peripheral vascular disease) (HCC) 12/14/2016   Suspected.  Hands hyperemic with decreased cap refill. ? History vasospasm   COPD with acute exacerbation (HCC) 01/14/2021   Encounter to establish care 11/19/2020   FRACTURE, TIBIAL PLATEAU 05/11/2010   Qualifier: Diagnosis of  By: Romeo Apple MD, [place]     Lung cancer, primary, with metastasis from lung to other site [facility]) 01/16/2018   Metastatic to the liver   Nuclear sclerotic cataract of both eyes 06/08/2018   Osteopenia 05/15/2013   Osteoporosis    osteopenia   Tobacco abuse 12/14/2016    Past Surgical History:  Procedure Laterality Date   FRACTURE SURGERY     torn ACL   KNEE ARTHROSCOPY      Family History  Problem Relation Age of Onset   Cancer Father        died in 5's everywhere   Heart  disease Mother    Cancer Mother        lung   Stroke Mother    Asthma Daughter     Social History:  reports that she has been smoking cigarettes. She started smoking about 60 years ago. She has a 30.1 pack-year smoking history. She has been exposed to tobacco smoke. She has never used smokeless tobacco. She reports current alcohol use of about 20.0 standard drinks of alcohol per week. She reports that she does not use drugs.  Allergies: No Known Allergies  Medications:  No current facility-administered medications on file prior to encounter.   Current Outpatient Medications on File Prior to Encounter  Medication Sig Dispense Refill   acetaminophen (TYLENOL) 500 MG tablet Take 1,000 mg by mouth every 6 (six) hours as needed for mild pain (pain score 1-3).     amiodarone (PACERONE) 200 MG tablet Take 200 mg by mouth daily.     apixaban (ELIQUIS) 5 MG TABS tablet Take 5 mg by mouth 2 (two) times daily.     furosemide (LASIX) 20 MG tablet Take 1 tablet (20 mg total) by mouth daily. 7 tablet 0   metoprolol succinate (TOPROL-XL) 25 MG 24 hr tablet Take 25 mg by mouth daily.     mirabegron ER (MYRBETRIQ) 50 MG TB24 tablet Take 1 tablet (50 mg total) by mouth daily. 30 tablet 5  mirtazapine (REMERON) 7.5 MG tablet Take 1 tablet (7.5 mg total) by mouth at bedtime. 90 tablet 1   Multiple Vitamins-Minerals (MULTIVITAMIN WITH MINERALS) tablet Take 1 tablet by mouth daily.       ROS: Constitutional: No fever or chills Vision: No changes in vision ENT: No difficulty swallowing CV: No chest pain Pulm: No SOB or wheezing GI: No nausea or vomiting GU: No urgency or inability to hold urine Skin: No poor wound healing Neurologic: No numbness or tingling Psychiatric: No depression or anxiety Heme: No bruising Allergic: No reaction to medications or food   Exam: Blood pressure 122/71, pulse 79, temperature 98.3 F (36.8 C), temperature source Oral, resp. rate 18, height 5' 6.5" (1.689 m),  weight 61.2 kg, SpO2 100%. General: No acute distress Orientation: Awake and alert Mood and Affect: Cooperative and pleasant Gait: Unable to assess due to her fracture Coordination and balance: Within normal limits   Left lower extremity: Leg is shortened and externally rotated.  Deformity through the proximal thigh.  Swelling in the lower extremity.  Compartments are soft compressible.  She has active dorsiflexion plantarflexion of foot and ankle.  She is warm well-perfused foot.  Palpable pulses were not able to be obtained.  Right lower extremity: Skin without lesions. No tenderness to palpation. Full painless ROM, full strength in each muscle groups without evidence of instability.   Medical Decision Making: Data: Imaging: X-rays were reviewed which shows a comminuted proximal intertrochanteric femur fracture with what appears to be a lytic lesion in the proximal femur.  Labs:  Results for orders placed or performed during the hospital encounter of 12/15/23 (from the past 24 hours)  CBC with Differential     Status: None   Collection Time: 12/15/23  6:47 PM  Result Value Ref Range   WBC 8.3 4.0 - 10.5 K/uL   RBC 4.17 3.87 - 5.11 MIL/uL   Hemoglobin 13.7 12.0 - 15.0 g/dL   HCT 60.4 54.0 - 98.1 %   MCV 98.3 80.0 - 100.0 fL   MCH 32.9 26.0 - 34.0 pg   MCHC 33.4 30.0 - 36.0 g/dL   RDW 19.1 47.8 - 29.5 %   Platelets 296 150 - 400 K/uL   nRBC 0.0 0.0 - 0.2 %   Neutrophils Relative % 67 %   Neutro Abs 5.7 1.7 - 7.7 K/uL   Lymphocytes Relative 20 %   Lymphs Abs 1.7 0.7 - 4.0 K/uL   Monocytes Relative 9 %   Monocytes Absolute 0.7 0.1 - 1.0 K/uL   Eosinophils Relative 2 %   Eosinophils Absolute 0.1 0.0 - 0.5 K/uL   Basophils Relative 1 %   Basophils Absolute 0.1 0.0 - 0.1 K/uL   Immature Granulocytes 1 %   Abs Immature Granulocytes 0.04 0.00 - 0.07 K/uL  Ethanol     Status: Abnormal   Collection Time: 12/15/23  6:47 PM  Result Value Ref Range   Alcohol, Ethyl (B) 91 (H) <10  mg/dL  Type and screen [facility]     Status: None   Collection Time: 12/15/23  7:14 PM  Result Value Ref Range   ABO/RH(D) O POS    Antibody Screen NEG    Sample Expiration      12/18/2023,2359 Performed at [facility], [address] [place], [place] [address]   Comprehensive metabolic panel     Status: Abnormal   Collection Time: 12/15/23  7:17 PM  Result Value Ref Range   Sodium 132 (L) 135 -  145 mmol/L   Potassium 3.9 3.5 - 5.1 mmol/L   Chloride 98 98 - 111 mmol/L   CO2 22 22 - 32 mmol/L   Glucose, Bld 90 70 - 99 mg/dL   BUN 25 (H) 8 - 23 mg/dL   Creatinine, Ser 8.41 0.44 - 1.00 mg/dL   Calcium 9.2 8.9 - 32.4 mg/dL   Total Protein 7.1 6.5 - 8.1 g/dL   Albumin 3.9 3.5 - 5.0 g/dL   AST 37 15 - 41 U/L   ALT 27 0 - 44 U/L   Alkaline Phosphatase 90 38 - 126 U/L   Total Bilirubin 0.5 0.0 - 1.2 mg/dL   GFR, Estimated >40 >10 mL/min   Anion gap 12 5 - 15  Troponin I (High Sensitivity)     Status: None   Collection Time: 12/15/23  7:17 PM  Result Value Ref Range   Troponin I (High Sensitivity) 5 <18 ng/L  Protime-INR     Status: None   Collection Time: 12/15/23  9:07 PM  Result Value Ref Range   Prothrombin Time 13.2 11.4 - 15.2 seconds   INR 1.0 0.8 - 1.2  Troponin I (High Sensitivity)     Status: None   Collection Time: 12/15/23  9:07 PM  Result Value Ref Range   Troponin I (High Sensitivity) 4 <18 ng/L  Magnesium     Status: None   Collection Time: 12/15/23  9:07 PM  Result Value Ref Range   Magnesium 1.9 1.7 - 2.4 mg/dL  Phosphorus     Status: None   Collection Time: 12/15/23  9:07 PM  Result Value Ref Range   Phosphorus 2.8 2.5 - 4.6 mg/dL  HIV Antibody (routine testing w rflx)     Status: None   Collection Time: 12/15/23  9:07 PM  Result Value Ref Range   HIV Screen 4th Generation wRfx Non Reactive Non Reactive  Basic metabolic panel     Status: Abnormal   Collection Time: 12/16/23  5:54 AM  Result Value Ref Range   Sodium 133 (L) 135 - 145 mmol/L    Potassium 4.0 3.5 - 5.1 mmol/L   Chloride 103 98 - 111 mmol/L   CO2 23 22 - 32 mmol/L   Glucose, Bld 103 (H) 70 - 99 mg/dL   BUN 20 8 - 23 mg/dL   Creatinine, Ser 2.72 0.44 - 1.00 mg/dL   Calcium 7.4 (L) 8.9 - 10.3 mg/dL   GFR, Estimated >53 >66 mL/min   Anion gap 7 5 - 15  CBC     Status: Abnormal   Collection Time: 12/16/23  5:54 AM  Result Value Ref Range   WBC 8.0 4.0 - 10.5 K/uL   RBC 3.59 (L) 3.87 - 5.11 MIL/uL   Hemoglobin 11.6 (L) 12.0 - 15.0 g/dL   HCT 44.0 (L) 34.7 - 42.5 %   MCV 98.3 80.0 - 100.0 fL   MCH 32.3 26.0 - 34.0 pg   MCHC 32.9 30.0 - 36.0 g/dL   RDW 95.6 38.7 - 56.4 %   Platelets 267 150 - 400 K/uL   nRBC 0.0 0.0 - 0.2 %     Imaging or Labs ordered: None  Medical history and chart was reviewed and case discussed with medical provider.  Assessment/Plan: 69 year old female with left proximal femur fracture likely pathologic.  We will plan to proceed with cephalomedullary nailing.  Risks and benefits were discussed with the patient.  Risks included but not limited to bleeding, infection, malunion, nonunion, hardware  failure, hardware rotation, nerve or blood vessel injury, DVT, even the possibility anesthetic complications.  These were also discussed with the patient's husband.  They agreed to proceed with surgery and consent was obtained.  Roby Lofts, MD [facility] [phone] (office) [email or web address]

## 2023-12-16 NOTE — Anesthesia Preprocedure Evaluation (Addendum)
 Anesthesia Evaluation  Patient identified by MRN, date of birth, ID band Patient confused    Reviewed: Allergy & Precautions, NPO status , Patient's Chart, lab work & pertinent test results  Airway Mallampati: II  TM Distance: >3 FB Neck ROM: Full    Dental no notable dental hx. (+) Teeth Intact, Dental Advisory Given   Pulmonary Current SmokerPatient did not abstain from smoking.   Pulmonary exam normal breath sounds clear to auscultation       Cardiovascular +CHF  negative cardio ROS Normal cardiovascular exam+ dysrhythmias Atrial Fibrillation  Rhythm:Regular Rate:Normal     Neuro/Psych  PSYCHIATRIC DISORDERS Anxiety    Dementia    GI/Hepatic negative GI ROS,,,(+)     substance abuse  alcohol use  Endo/Other  diabetes    Renal/GU Lab Results      Component                Value               Date                      NA                       133 (L)             12/16/2023                CL                       103                 12/16/2023                K                        4.0                 12/16/2023                CO2                      23                  12/16/2023                BUN                      20                  12/16/2023                CREATININE               0.55                12/16/2023                GFRNONAA                 >60                 12/16/2023                CALCIUM                  7.4 (L)  12/16/2023                PHOS                     2.8                 12/15/2023                ALBUMIN                  3.9                 12/15/2023                GLUCOSE                  103 (H)             12/16/2023                Musculoskeletal  (+) Arthritis ,    Abdominal   Peds  Hematology Lab Results      Component                Value               Date                      WBC                      8.0                 12/16/2023                HGB                       11.6 (L)            12/16/2023                HCT                      35.3 (L)            12/16/2023                MCV                      98.3                12/16/2023                PLT                      267                 12/16/2023              Anesthesia Other Findings   Reproductive/Obstetrics                             Anesthesia Physical Anesthesia Plan  ASA: 3  Anesthesia Plan: General   Post-op Pain Management:    Induction: Intravenous  PONV Risk Score and Plan: Treatment may vary due to age or medical condition, Ondansetron and Dexamethasone  Airway Management Planned: Oral ETT  Additional Equipment: None  Intra-op Plan:   Post-operative Plan: Extubation in  OR  Informed Consent: I have reviewed the patients History and Physical, chart, labs and discussed the procedure including the risks, benefits and alternatives for the proposed anesthesia with the patient or authorized representative who has indicated his/her understanding and acceptance.     Dental advisory given and Consent reviewed with POA  Plan Discussed with: CRNA and Surgeon  Anesthesia Plan Comments: (Co)       Anesthesia Quick Evaluation

## 2023-12-16 NOTE — Consult Note (Signed)
 Orthopaedic Trauma Service (OTS) Consult   Patient ID: Cynthia Kelley MRN: [id number] DOB/AGE: 70-Jun-1957 70 y.o.  Reason for Consult:Left proximal femur fracture Referring Physician: Dr. Fuller Canada, MD  HPI: Cynthia Kelley is an 68 y.o. female who is being seen in consultation at the request of Dr. Romeo Apple for evaluation of left proximal femur fracture.  Patient had a ground-level fall.  She has a history of dementia COPD and congestive heart failure.  She also has a previous history of lung cancer.  On x-ray she was found to have a left proximal femur fracture with lytic lesions.  Due to complexity of the injury Dr. Romeo Apple felt that the best by transferring to a tertiary level care center.  I was contacted by the emergency room.  Patient was seen and evaluated.  She ambulates without assist device.  Her husband is driving in and I talked to him over the phone.  Denies any other injuries.  She has a chronic proximal humerus fracture.  Past Medical History:  Diagnosis Date   A-fib [facility])    Acute diastolic congestive heart failure, NYHA class 2 (HCC) 11/11/2020   Anemia 70/26/2019   Anxiety 11/19/2020   Arthritis    Asymptomatic PVD (peripheral vascular disease) (HCC) 12/14/2016   Suspected.  Hands hyperemic with decreased cap refill. ? History vasospasm   COPD with acute exacerbation (HCC) 01/14/2021   Encounter to establish care 11/19/2020   FRACTURE, TIBIAL PLATEAU 05/11/2010   Qualifier: Diagnosis of  By: Romeo Apple MD, [place]     Lung cancer, primary, with metastasis from lung to other site [facility]) 01/16/2018   Metastatic to the liver   Nuclear sclerotic cataract of both eyes 06/08/2018   Osteopenia 05/15/2013   Osteoporosis    osteopenia   Tobacco abuse 12/14/2016    Past Surgical History:  Procedure Laterality Date   FRACTURE SURGERY     torn ACL   KNEE ARTHROSCOPY      Family History  Problem Relation Age of Onset   Cancer Father        died in 5's everywhere   Heart  disease Mother    Cancer Mother        lung   Stroke Mother    Asthma Daughter     Social History:  reports that she has been smoking cigarettes. She started smoking about 60 years ago. She has a 30.1 pack-year smoking history. She has been exposed to tobacco smoke. She has never used smokeless tobacco. She reports current alcohol use of about 20.0 standard drinks of alcohol per week. She reports that she does not use drugs.  Allergies: No Known Allergies  Medications:  No current facility-administered medications on file prior to encounter.   Current Outpatient Medications on File Prior to Encounter  Medication Sig Dispense Refill   acetaminophen (TYLENOL) 500 MG tablet Take 1,000 mg by mouth every 6 (six) hours as needed for mild pain (pain score 1-3).     amiodarone (PACERONE) 200 MG tablet Take 200 mg by mouth daily.     apixaban (ELIQUIS) 5 MG TABS tablet Take 5 mg by mouth 2 (two) times daily.     furosemide (LASIX) 20 MG tablet Take 1 tablet (20 mg total) by mouth daily. 7 tablet 0   metoprolol succinate (TOPROL-XL) 25 MG 24 hr tablet Take 25 mg by mouth daily.     mirabegron ER (MYRBETRIQ) 50 MG TB24 tablet Take 1 tablet (50 mg total) by mouth daily. 30 tablet 5  mirtazapine (REMERON) 7.5 MG tablet Take 1 tablet (7.5 mg total) by mouth at bedtime. 90 tablet 1   Multiple Vitamins-Minerals (MULTIVITAMIN WITH MINERALS) tablet Take 1 tablet by mouth daily.       ROS: Constitutional: No fever or chills Vision: No changes in vision ENT: No difficulty swallowing CV: No chest pain Pulm: No SOB or wheezing GI: No nausea or vomiting GU: No urgency or inability to hold urine Skin: No poor wound healing Neurologic: No numbness or tingling Psychiatric: No depression or anxiety Heme: No bruising Allergic: No reaction to medications or food   Exam: Blood pressure 122/71, pulse 79, temperature 98.3 F (36.8 C), temperature source Oral, resp. rate 18, height 5' 6.5" (1.689 m),  weight 61.2 kg, SpO2 100%. General: No acute distress Orientation: Awake and alert Mood and Affect: Cooperative and pleasant Gait: Unable to assess due to her fracture Coordination and balance: Within normal limits   Left lower extremity: Leg is shortened and externally rotated.  Deformity through the proximal thigh.  Swelling in the lower extremity.  Compartments are soft compressible.  She has active dorsiflexion plantarflexion of foot and ankle.  She is warm well-perfused foot.  Palpable pulses were not able to be obtained.  Right lower extremity: Skin without lesions. No tenderness to palpation. Full painless ROM, full strength in each muscle groups without evidence of instability.   Medical Decision Making: Data: Imaging: X-rays were reviewed which shows a comminuted proximal intertrochanteric femur fracture with what appears to be a lytic lesion in the proximal femur.  Labs:  Results for orders placed or performed during the hospital encounter of 12/15/23 (from the past 24 hours)  CBC with Differential     Status: None   Collection Time: 12/15/23  6:47 PM  Result Value Ref Range   WBC 8.3 4.0 - 10.5 K/uL   RBC 4.17 3.87 - 5.11 MIL/uL   Hemoglobin 13.7 12.0 - 15.0 g/dL   HCT 60.4 54.0 - 98.1 %   MCV 98.3 80.0 - 100.0 fL   MCH 32.9 26.0 - 34.0 pg   MCHC 33.4 30.0 - 36.0 g/dL   RDW 19.1 47.8 - 29.5 %   Platelets 296 150 - 400 K/uL   nRBC 0.0 0.0 - 0.2 %   Neutrophils Relative % 67 %   Neutro Abs 5.7 1.7 - 7.7 K/uL   Lymphocytes Relative 20 %   Lymphs Abs 1.7 0.7 - 4.0 K/uL   Monocytes Relative 9 %   Monocytes Absolute 0.7 0.1 - 1.0 K/uL   Eosinophils Relative 2 %   Eosinophils Absolute 0.1 0.0 - 0.5 K/uL   Basophils Relative 1 %   Basophils Absolute 0.1 0.0 - 0.1 K/uL   Immature Granulocytes 1 %   Abs Immature Granulocytes 0.04 0.00 - 0.07 K/uL  Ethanol     Status: Abnormal   Collection Time: 12/15/23  6:47 PM  Result Value Ref Range   Alcohol, Ethyl (B) 91 (H) <10  mg/dL  Type and screen [facility]     Status: None   Collection Time: 12/15/23  7:14 PM  Result Value Ref Range   ABO/RH(D) O POS    Antibody Screen NEG    Sample Expiration      12/18/2023,2359 Performed at [facility], [address] [place], [place] [address]   Comprehensive metabolic panel     Status: Abnormal   Collection Time: 12/15/23  7:17 PM  Result Value Ref Range   Sodium 132 (L) 135 -  145 mmol/L   Potassium 3.9 3.5 - 5.1 mmol/L   Chloride 98 98 - 111 mmol/L   CO2 22 22 - 32 mmol/L   Glucose, Bld 90 70 - 99 mg/dL   BUN 25 (H) 8 - 23 mg/dL   Creatinine, Ser 8.41 0.44 - 1.00 mg/dL   Calcium 9.2 8.9 - 32.4 mg/dL   Total Protein 7.1 6.5 - 8.1 g/dL   Albumin 3.9 3.5 - 5.0 g/dL   AST 37 15 - 41 U/L   ALT 27 0 - 44 U/L   Alkaline Phosphatase 90 38 - 126 U/L   Total Bilirubin 0.5 0.0 - 1.2 mg/dL   GFR, Estimated >40 >10 mL/min   Anion gap 12 5 - 15  Troponin I (High Sensitivity)     Status: None   Collection Time: 12/15/23  7:17 PM  Result Value Ref Range   Troponin I (High Sensitivity) 5 <18 ng/L  Protime-INR     Status: None   Collection Time: 12/15/23  9:07 PM  Result Value Ref Range   Prothrombin Time 13.2 11.4 - 15.2 seconds   INR 1.0 0.8 - 1.2  Troponin I (High Sensitivity)     Status: None   Collection Time: 12/15/23  9:07 PM  Result Value Ref Range   Troponin I (High Sensitivity) 4 <18 ng/L  Magnesium     Status: None   Collection Time: 12/15/23  9:07 PM  Result Value Ref Range   Magnesium 1.9 1.7 - 2.4 mg/dL  Phosphorus     Status: None   Collection Time: 12/15/23  9:07 PM  Result Value Ref Range   Phosphorus 2.8 2.5 - 4.6 mg/dL  HIV Antibody (routine testing w rflx)     Status: None   Collection Time: 12/15/23  9:07 PM  Result Value Ref Range   HIV Screen 4th Generation wRfx Non Reactive Non Reactive  Basic metabolic panel     Status: Abnormal   Collection Time: 12/16/23  5:54 AM  Result Value Ref Range   Sodium 133 (L) 135 - 145 mmol/L    Potassium 4.0 3.5 - 5.1 mmol/L   Chloride 103 98 - 111 mmol/L   CO2 23 22 - 32 mmol/L   Glucose, Bld 103 (H) 70 - 99 mg/dL   BUN 20 8 - 23 mg/dL   Creatinine, Ser 2.72 0.44 - 1.00 mg/dL   Calcium 7.4 (L) 8.9 - 10.3 mg/dL   GFR, Estimated >53 >66 mL/min   Anion gap 7 5 - 15  CBC     Status: Abnormal   Collection Time: 12/16/23  5:54 AM  Result Value Ref Range   WBC 8.0 4.0 - 10.5 K/uL   RBC 3.59 (L) 3.87 - 5.11 MIL/uL   Hemoglobin 11.6 (L) 12.0 - 15.0 g/dL   HCT 44.0 (L) 34.7 - 42.5 %   MCV 98.3 80.0 - 100.0 fL   MCH 32.3 26.0 - 34.0 pg   MCHC 32.9 30.0 - 36.0 g/dL   RDW 95.6 38.7 - 56.4 %   Platelets 267 150 - 400 K/uL   nRBC 0.0 0.0 - 0.2 %     Imaging or Labs ordered: None  Medical history and chart was reviewed and case discussed with medical provider.  Assessment/Plan: 69 year old female with left proximal femur fracture likely pathologic.  We will plan to proceed with cephalomedullary nailing.  Risks and benefits were discussed with the patient.  Risks included but not limited to bleeding, infection, malunion, nonunion, hardware  failure, hardware rotation, nerve or blood vessel injury, DVT, even the possibility anesthetic complications.  These were also discussed with the patient's husband.  They agreed to proceed with surgery and consent was obtained.  Roby Lofts, MD [facility] [phone] (office) [email or web address]

## 2023-12-16 NOTE — Anesthesia Postprocedure Evaluation (Signed)
 Anesthesia Post Note  Patient: Cynthia Kelley  Procedure(s) Performed: FIXATION, FRACTURE, INTERTROCHANTERIC, WITH INTRAMEDULLARY ROD (Left: Leg Upper)     Patient location during evaluation: PACU Anesthesia Type: General Level of consciousness: awake and alert Pain management: pain level controlled Vital Signs Assessment: post-procedure vital signs reviewed and stable Respiratory status: spontaneous breathing, nonlabored ventilation, respiratory function stable and patient connected to nasal cannula oxygen Cardiovascular status: blood pressure returned to baseline and stable Postop Assessment: no apparent nausea or vomiting Anesthetic complications: no  No notable events documented.  Last Vitals:  Vitals:   12/16/23 1400 12/16/23 1424  BP: 108/60 (P) 100/60  Pulse: 87 (P) 90  Resp: 20 (P) 18  Temp: 36.4 C (!) (P) 36.4 C  SpO2: 92% (P) 93%    Last Pain:  Vitals:   12/16/23 1424  TempSrc: (P) Oral  PainSc:                  Trevor Iha

## 2023-12-16 NOTE — Op Note (Signed)
 Orthopaedic Surgery Operative Note (CSN: [id number] ) Date of Surgery: 12/16/2023  Admit Date: 12/15/2023   Diagnoses: Pre-Op Diagnoses: Left pathologic proximal femur fracture  Post-Op Diagnosis: Same  Procedures: CPT 27245-Cephalomedullary nailing of left intertrochanteric femur fracture CPT 20245-Deep bone biopsy  Surgeons : Primary: Roby Lofts, MD  Assistant: Thyra Breed, PA-C  Location: OR 3   Anesthesia: General   Antibiotics: Ancef 2g preop   Tourniquet time: None    Estimated Blood Loss: 50 mL  Complications:* No complications entered in OR log *   Specimens: ID Type Source Tests Collected by Time Destination  1 : Left Femur Lesion Tissue PATH Bone biopsy SURGICAL PATHOLOGY Jena Gauss Gillie Manners, MD 12/16/2023 1231      Implants: Implant Name Type Inv. Item Serial No. Manufacturer Lot No. LRB No. Used Action  NAIL LOCK CANN 10X340 130D LT - [id number] Nail NAIL LOCK CANN 10X340 130D LT  [employer] [id number] Left 1 Implanted  SCREW LAG COMPR KIT 95/90 - [id number] Screw SCREW LAG COMPR KIT 95/90  [employer] [id number] Left 1 Implanted  SCREW TRIGEN LOW PROF 5.0X37.5 - [id number] Screw SCREW TRIGEN LOW PROF 5.0X37.5  [employer] [id number] Left 1 Implanted     Indications for Surgery: 68-year-old female who sustained a ground-level fall with a left proximal femur fracture that appeared to be pathologic.  Due to the unstable nature of her injury I recommend proceeding with cephalomedullary nailing of the left hip.  Risks and benefits were discussed with the patient and her husband.  Risks include but not limited to bleeding, infection, malunion, nonunion, hardware failure, hardware irritation, nerve or blood vessel injury, DVT, and the possibility anesthetic complications.  We also plan to perform a deep biopsy to assess whether this lesion was metastatic.  They agreed to proceed with surgery and consent was  obtained.  Operative Findings: 1.  Cephalomedullary nailing of left pathologic proximal femur fracture using the [employer] 10 x 340 mm nail with a 95 mm lag screw and 90 mm compression screw. 2.  Deep bone biopsy with sending material to the pathology.  Procedure: The patient was identified in the preoperative holding area. Consent was confirmed with the patient and their family and all questions were answered. The operative extremity was marked after confirmation with the patient. she was then brought back to the operating room by our anesthesia colleagues.  She was placed under general anesthetic and carefully transferred over to a Hana table.  All bony prominences were well-padded.  Traction was applied to the left lower extremity and fluoroscopic imaging showed adequate alignment.  The left lower extremity was then prepped and draped in usual sterile fashion.  Timeout was performed to verify the patient, the procedure, and the extremity.  Preoperative antibiotics were dosed.  Small incision proximal to the greater trochanter was made and carried down through skin and subcutaneous tissue.  Threaded guidewire was placed at the tip of the greater trochanter.  Advanced into the proximal metaphysis.  An entry reamer was used to enter the medullary canal.  I then placed a pituitary rondure in the medullary canal and grasped with some tissue and sent this to pathology.  I then passed a ball-tipped guidewire down the center canal and seated it into the distal metaphysis.  I then measured the length and chose to use a 340 mm nail.  I then reamed with a 10 mm reamer and obtained minimal chatter.  I then placed  a 10 x 340 mm nail down the center of the canal.  I then used the targeting arm to direct a threaded guidewire into the head/neck segment.  I confirmed adequate tip apex distance.  I measured the length and decided to use a 95 mm screw.  I then drilled the path for the compression screw and placed an  antirotation bar.  I then drilled the path for the lag screw and placed a lag screw.  I then placed the compression screw and compressed approximately 5 mm.  I statically locked the proximal portion of the nail.  I then used perfect circle technique to place a distal lateral to medial interlocking screw.  Final fluoroscopic imaging was obtained.  The incisions were irrigated and closed with 2-0 Monocryl and Dermabond.  Sterile dressings were applied.  The patient was then awoke from anesthesia and taken to the PACU in stable condition.  Post Op Plan/Instructions: Patient will be weightbearing as tolerated to the left lower extremity.  She will need workup for potential metastatic disease.  Will have her mobilize physical and Occupational Therapy.  I would recommend aspirin for DVT prophylaxis.  I was present and performed the entire surgery.  Thyra Breed, PA-C did assist me throughout the case. An assistant was necessary given the difficulty in approach, maintenance of reduction and ability to instrument the fracture.   Truitt Merle, MD [facility]

## 2023-12-16 NOTE — Anesthesia Procedure Notes (Signed)
 Procedure Name: Intubation Date/Time: 12/16/2023 11:56 AM  Performed by: Shary Decamp, CRNAPre-anesthesia Checklist: Patient identified, Patient being monitored, Timeout performed, Emergency Drugs available and Suction available Patient Re-evaluated:Patient Re-evaluated prior to induction Oxygen Delivery Method: Circle System Utilized Preoxygenation: Pre-oxygenation with 100% oxygen Induction Type: IV induction Ventilation: Mask ventilation without difficulty Laryngoscope Size: Miller and 2 Grade View: Grade I Tube type: Oral Tube size: 7.0 mm Number of attempts: 1 Airway Equipment and Method: Stylet Placement Confirmation: ETT inserted through vocal cords under direct vision, positive ETCO2 and breath sounds checked- equal and bilateral Secured at: 21 cm Tube secured with: Tape Dental Injury: Teeth and Oropharynx as per pre-operative assessment

## 2023-12-16 NOTE — Progress Notes (Signed)
 Progress Note   Patient: Cynthia Kelley ZOX:[id number] DOB: [birth year] DOA: 12/15/2023     1 DOS: the patient was seen and examined on 12/16/2023   Brief hospital admission narrative: As per H&P written by Dr. Mariea Clonts on 12/15/2023 Rosabel Sermeno is a 68 y.o. female with medical history significant for alcohol abuse, atrial flutter, dementia, COPD, diastolic CHF.  Patient presented to the ED with complaints of a fall on the tile floor in her bathroom today.  On my evaluation, patient is awake, able to answer some questions, speech is intermittently confused.  Spouse is not present at bedside.  She drinks alcohol daily she tells me she drinks vodka.  Last drink was this morning.  Spouse told EDP- patient drinks 6 alcoholic beverages daily, he said patient had not taken her Eliquis in the past week.  She has a chronic right shoulder fracture.   ED Course: Blood pressure 92-126 systolic.  Blood alcohol level of 91.  Pelvic x-ray shows left femoral intertrochanteric fracture with severe varus angulation. Head CT without acute abnormality. Right shoulder x-ray shows markedly angulated and displaced right femoral neck fracture.  Seen on previous imaging EDP to Dr. Romeo Apple who recommended transfer to tertiary care center. EDP talked to Dr. Jena Gauss, agreed to transfer to [facility] will see in consult.  Assessment and Plan: * Closed left femoral fracture (HCC) -Status post fall today, patient intoxicated with alcohol.  Pelvic x-ray-  Left femoral intertrochanteric fracture with severe varus angulation.   -Head CT negative for acute abnormality.  She has chronic right femur fracture , sustained from a fall, that has been present since 6/82023, per notes patient and spouse opted for nonoperative management. -Continue n.p.o. status -Patient will be transferred to [facility] for surgical repair -Continue in no weightbearing, analgesics and supportive care.   -Continue holding Eliquis at the  moment.  Alcohol intoxication (HCC) -Blood alcohol level of 91 at time of admission.   -Per records and husband reports patient drinks approximately 6 alcoholic beverage per day -No active withdrawal currently appreciated -Continue CIWA protocol -Thiamine and folic acid has been ordered -Cessation counseling provided.  Paroxysmal atrial fibrillation (HCC) -EKG shows atrial flutter at time of admission.   -Per patient's husband approximately 1 week without Eliquis -Will continue amiodarone, metoprolol for rate control -Continue holding Eliquis resumption given anticipated surgical repair of her left hip. -Continue telemetry monitoring.  Chronic diastolic CHF (congestive heart failure) (HCC) -Stable and compensated -Follow daily weights, strict I's and O's and maintain adequate hydration -Outpatient follow-up with cardiology service recommended.  Lung cancer, primary, with metastasis from lung to other site [facility]) COPD -No receiving active treatment currently -Continue patient follow-up with oncology service (patient follows with [facility]) -Continue bronchodilator management and the use of oxygen supplementation   Subjective:  No fever, no chest pain, no nausea vomiting.  While not moving as placed no significant pain in her left hip.  Patient expressed to be hungry.  Physical Exam: Vitals:   12/16/23 0400 12/16/23 0500 12/16/23 0600 12/16/23 0900  BP: (!) 104/57 (!) 116/58 111/72 110/60  Pulse: 82 86 83 79  Resp: 12 10 16    Temp:      TempSrc:      SpO2: 99% 100% 100% 100%   General exam: In no acute distress while not moving; reporting intermittent left hip discomfort. Respiratory system: 2 L nasal cannula in place with good saturation; no wheezing, no crackles, no using accessory muscle. Cardiovascular system: Rate controlled, no  rubs, no gallops, no JVD on exam. Gastrointestinal system: Abdomen is nondistended, soft and nontender. No organomegaly or masses felt.  Normal bowel sounds heard. Central nervous system: No focal neurological deficits.  Limited examination due to hip fracture and pain with movement Extremities: No C/C/E, +pedal pulses Skin: No petechiae. Psychiatry: Judgement and insight appear impaired secondary to underlying dementia.  Mood & affect appropriate.   Data Reviewed: CBC: WBCs 8.0, hemoglobin 11.6 and platelet count 267K Basic metabolic panel: Sodium 133, potassium 4.0, chloride 103, bicarb 23, BUN 20, creatinine 0.55 and GFR >60  Family Communication: Husband at bedside.  Disposition: Status is: Inpatient Remains inpatient appropriate because: Surgical repair for hip fracture.   Planned Discharge Destination: to be determined; but most likely SNF after hip surgery for rehab.   Time spent: 50 minutes  Author: Vassie Loll, MD 12/16/2023 9:21 AM  For on call review [email or web address].

## 2023-12-17 ENCOUNTER — Inpatient Hospital Stay (HOSPITAL_COMMUNITY)

## 2023-12-17 DIAGNOSIS — I5032 Chronic diastolic (congestive) heart failure: Secondary | ICD-10-CM | POA: Diagnosis not present

## 2023-12-17 DIAGNOSIS — F10920 Alcohol use, unspecified with intoxication, uncomplicated: Secondary | ICD-10-CM | POA: Diagnosis not present

## 2023-12-17 DIAGNOSIS — S72002A Fracture of unspecified part of neck of left femur, initial encounter for closed fracture: Secondary | ICD-10-CM | POA: Diagnosis not present

## 2023-12-17 DIAGNOSIS — F03918 Unspecified dementia, unspecified severity, with other behavioral disturbance: Secondary | ICD-10-CM | POA: Diagnosis not present

## 2023-12-17 LAB — CBC
HCT: 29.2 % — ABNORMAL LOW (ref 36.0–46.0)
Hemoglobin: 9.6 g/dL — ABNORMAL LOW (ref 12.0–15.0)
MCH: 32.3 pg (ref 26.0–34.0)
MCHC: 32.9 g/dL (ref 30.0–36.0)
MCV: 98.3 fL (ref 80.0–100.0)
Platelets: 236 10*3/uL (ref 150–400)
RBC: 2.97 MIL/uL — ABNORMAL LOW (ref 3.87–5.11)
RDW: 13.5 % (ref 11.5–15.5)
WBC: 11.1 10*3/uL — ABNORMAL HIGH (ref 4.0–10.5)
nRBC: 0 % (ref 0.0–0.2)

## 2023-12-17 LAB — HEPATIC FUNCTION PANEL
ALT: 19 U/L (ref 0–44)
AST: 29 U/L (ref 15–41)
Albumin: 3.2 g/dL — ABNORMAL LOW (ref 3.5–5.0)
Alkaline Phosphatase: 59 U/L (ref 38–126)
Bilirubin, Direct: 0.2 mg/dL (ref 0.0–0.2)
Indirect Bilirubin: 0.7 mg/dL (ref 0.3–0.9)
Total Bilirubin: 0.9 mg/dL (ref 0.0–1.2)
Total Protein: 5.8 g/dL — ABNORMAL LOW (ref 6.5–8.1)

## 2023-12-17 LAB — TSH: TSH: 0.482 u[IU]/mL (ref 0.350–4.500)

## 2023-12-17 LAB — BASIC METABOLIC PANEL
Anion gap: 5 (ref 5–15)
BUN: 13 mg/dL (ref 8–23)
CO2: 22 mmol/L (ref 22–32)
Calcium: 8.5 mg/dL — ABNORMAL LOW (ref 8.9–10.3)
Chloride: 106 mmol/L (ref 98–111)
Creatinine, Ser: 0.68 mg/dL (ref 0.44–1.00)
GFR, Estimated: >60 mL/min (ref >60)
Glucose, Bld: 146 mg/dL — ABNORMAL HIGH (ref 70–99)
Potassium: 4.2 mmol/L (ref 3.5–5.1)
Sodium: 133 mmol/L — ABNORMAL LOW (ref 135–145)

## 2023-12-17 LAB — PHOSPHORUS: Phosphorus: 3.9 mg/dL (ref 2.5–4.6)

## 2023-12-17 LAB — VITAMIN D 25 HYDROXY (VIT D DEFICIENCY, FRACTURES): Vit D, 25-Hydroxy: 20.33 ng/mL — ABNORMAL LOW (ref 30–100)

## 2023-12-17 LAB — MAGNESIUM: Magnesium: 1.8 mg/dL (ref 1.7–2.4)

## 2023-12-17 MED ORDER — VITAMIN D 25 MCG (1000 UNIT) PO TABS
1000.0000 [IU] | ORAL_TABLET | Freq: Every day | ORAL | Status: DC
  Administered 2023-12-17 – 2023-12-18 (×2): 1000 [IU] via ORAL
  Filled 2023-12-17 (×2): qty 1

## 2023-12-17 MED ORDER — METOPROLOL TARTRATE 5 MG/5ML IV SOLN
5.0000 mg | Freq: Three times a day (TID) | INTRAVENOUS | Status: DC | PRN
Start: 2023-12-17 — End: 2023-12-25
  Administered 2023-12-17: 5 mg via INTRAVENOUS
  Filled 2023-12-17: qty 5

## 2023-12-17 MED ORDER — ASPIRIN 81 MG PO TBEC: 81.0000 mg | DELAYED_RELEASE_TABLET | Freq: Two times a day (BID) | ORAL | Status: DC

## 2023-12-17 MED ORDER — SODIUM CHLORIDE 0.9 % IV BOLUS
1000.0000 mL | Freq: Once | INTRAVENOUS | Status: AC
  Administered 2023-12-17: 1000 mL via INTRAVENOUS

## 2023-12-17 MED ORDER — APIXABAN 5 MG PO TABS
5.0000 mg | ORAL_TABLET | Freq: Two times a day (BID) | ORAL | Status: DC
  Administered 2023-12-17 – 2023-12-25 (×17): 5 mg via ORAL
  Filled 2023-12-17 (×16): qty 1

## 2023-12-17 NOTE — Progress Notes (Signed)
 Pt states she drinks 6+ vodka mixed drinks per day for "years"

## 2023-12-17 NOTE — Progress Notes (Signed)
 PROGRESS NOTE    Cynthia Kelley  ZOX:[id number] DOB: 12-04-1955 DOA: 12/15/2023 PCP: Anabel Halon, MD   Brief Narrative:  The patient is a 68 year old Caucasian female with past medical history significant for Mannam to alcohol abuse, atrial flutter, dementia, COPD, diastolic CHF and other comorbidities who presents with complaint of a fall.  Initially presented to [facility] and transferred to [facility] for further evaluation.  Patient drinks 6 alcoholic beverages daily and was intoxicated prior to coming in.  Further workup was done and she was found to have a left intratrochanteric femur fracture and orthopedic surgery was consulted and she is now status post cephalomedullary nailing.  Hospitalization has been complicated by postoperative acute blood loss anemia, alcohol intoxication with concern for withdrawal and A-fib with RVR.  She is now having trouble voiding and will be doing In-N-Out cath and if she feels the third In-N-Out cath we will place a Foley.  Assessment and Plan:  Closed left intertrochanteric femur fracture (HCC) in the setting of Fall s/p Cephalomedullary Nailing: Patient intoxicated with alcohol on admit. Pelvic x-ray >  Left femoral intertrochanteric fracture with severe varus angulation.  -Head CT negative for acute abnormality.   -She has chronic right femur fracture , sustained from a fall, that has been present since 6/82023, per notes patient and spouse opted for nonoperative management.  -Now s/p surgical intervention and a deep bone biopsy and further care per orthopedic surgery. AC w/ Apixaban has been resumed.   -Pain control with Acetaminophen 650 mg po/RC q6hprn, Oxycodone IR 5-10 mg q4hprn Moderate Pain, and IV Hydromorphone 0.5-1 mg q4hprn Severe Pain. C/w Metaclopramide 5-10 mg po/IV q8hprn and Ondansetron 4 mg po/IV q6hprn Nausea.  -C/w Bowel Regimen. WBAT LLE and PT/OT recommending SNF. -She now has left knee pain the orthopedic surgery team is ordering her  x-ray   Alcohol intoxication (HCC) with Concern for Withdrawal: Blood alcohol level of 91 at time of admission.  er records and husband reports patient drinks approximately 6 alcoholic beverage per day. Continue CIWA protocol. C/w MVI+Minerals 1 tab po daily, Folic Acid 1 mg po Daily and Thiamine 100 mg po/IV. Cessation counseling provided.   Paroxysmal Atrial Fibrillation (HCC) w/ RVR: EKG shows atrial flutter at time of admission. TSH is now 0.482. Off of Apixaban for approximately 1 week Prior to Surgery but resumed 3/22 by Ortho at 5 mg po BID. C/w Amiodarone 200 mg po at bedtime and c/w Metoprolol Succinate 25 mg po at bedtime. CTM on Telemetry   Chronic Diastolic CHF (congestive heart failure) (HCC): Stable and compensated. Follow daily weights, strict I's and O's and maintain adequate hydration. Given a 1 Liter bolus today given HR. CTM for S/Sx of Volume Overload. Holding Furosemide 20 mg po Daily. Outpatient follow-up with cardiology service recommended.  HypoNa+: Na+ is now stable around 133. CTM and Trend and repeat CMP in the AM  Leukocytosis: WBC went from 8.0 -> 11.1. Likely reactive in the setting of Surgery. CTM for S/Sx of Infection. Repeat CBC in the AM  Vitamin D Deficiency: Vitamin D Level was 20.33. Start Cholecalciferol 1,000 international units  Daily   Post-Operative ABLA: Hgb/Hct went from 11.6/35.3 -> 9.6/29.2. Check Anemia Panel in the AM. CTM for S/Sx of Bleeding; No overt bleeding noted.   COPD: Currently not decompensated. CTM Respiratory carefully and will utilize bronchodilator management and the use of oxygen supplementation if necessary  Dementia: C/w Mirtazapine 7.5 mg po at bedtime. Delirium Precautions. Limit Narcotics if able  Primary Lung Cancer with Metastasis from Lung to Other Site: Not receiving active treatment currently.  Will need follow-up with primary medical oncology team @ [facility]   Hypoalbuminemia: Albumin went from 3.9 -> 3.2. CTM  and Trend and repeat CMP in the AM:   DVT prophylaxis: SCDs Start: 12/16/23 1435 SCDs Start: 12/15/23 2313 apixaban (ELIQUIS) tablet 5 mg    Code Status: Full Code Family Communication: Discussed with husband at bedside  Disposition Plan:  Level of care: Telemetry Medical Status is: Inpatient Remains inpatient appropriate because: Needs further clinical improvement and PT OT recommending SNF   Consultants:  Orthopedic Surgery  Procedures:  Procedures by Dr. Jena Gauss: CPT 27245-Cephalomedullary nailing of left intertrochanteric femur fracture CPT 20245-Deep bone biopsy  Antimicrobials:  Anti-infectives (From admission, onward)    Start     Dose/Rate Route Frequency Ordered Stop   12/16/23 2000  ceFAZolin (ANCEF) IVPB 2g/100 mL premix        2 g 200 mL/hr over 30 Minutes Intravenous Every 8 hours 12/16/23 1434 12/17/23 1414       Subjective: Seen and examined at bedside and was feeling little bit anxious and heart rate was elevated.  She denied any pain at this time.  No nausea or vomiting.  Feels okay but very withdrawn.  She is only alert and oriented to herself, year but not the month or the president.  Denies any other concerns or plans at this time.  Objective: Vitals:   12/17/23 0357 12/17/23 0826 12/17/23 1115 12/17/23 1520  BP: 113/68 119/65 (!) 105/56 (!) 122/59  Pulse: (!) 110 (!) 124 (!) 107 (!) 118  Resp:  18 18 18   Temp:  98 F (36.7 C) 97.8 F (36.6 C) 97.7 F (36.5 C)  TempSrc:  Axillary Oral Axillary  SpO2: 97% 97% 98% 92%  Weight:      Height:        Intake/Output Summary (Last 24 hours) at 12/17/2023 1706 Last data filed at 12/17/2023 1530 Gross per 24 hour  Intake 1663.78 ml  Output 2100 ml  Net -436.22 ml   Filed Weights   12/16/23 1053  Weight: 61.2 kg   Examination: Physical Exam:  Constitutional: Thin Caucasian elderly female who appears older than her stated age who appears slightly uncomfortable Respiratory: Diminished to  auscultation bilaterally, no wheezing, rales, rhonchi or crackles. Normal respiratory effort and patient is not tachypenic. No accessory muscle use.  Cardiovascular: Irregularly irregular tachycardic.  No murmurs / rubs / gallops. S1 and S2 auscultated. No extremity edema.  Abdomen: Soft, non-tender, non-distended. No masses palpated. No appreciable hepatosplenomegaly. Bowel sounds positive.  GU: Deferred. Musculoskeletal: No clubbing / cyanosis of digits/nails. No joint deformity upper and lower extremities. Skin: No rashes, lesions, ulcers on limited skin evaluation. No induration; Warm and dry.  Neurologic: CN 2-12 grossly intact with no focal deficits. Romberg sign cerebellar reflexes not assessed.  Psychiatric: Normal judgment and insight.  She is alert and oriented to herself, her birthday, year but not month.   Data Reviewed: I have personally reviewed following labs and imaging studies  CBC: Recent Labs  Lab 12/15/23 1847 12/16/23 0554 12/17/23 0432  WBC 8.3 8.0 11.1*  NEUTROABS 5.7  --   --   HGB 13.7 11.6* 9.6*  HCT 41.0 35.3* 29.2*  MCV 98.3 98.3 98.3  PLT 296 267 236   Basic Metabolic Panel: Recent Labs  Lab 12/15/23 1917 12/15/23 2107 12/16/23 0554 12/17/23 0432  NA 132*  --  133*  133*  K 3.9  --  4.0 4.2  CL 98  --  103 106  CO2 22  --  23 22  GLUCOSE 90  --  103* 146*  BUN 25*  --  20 13  CREATININE 0.63  --  0.55 0.68  CALCIUM 9.2  --  7.4* 8.5*  MG  --  1.9  --  1.8  PHOS  --  2.8  --  3.9   GFR: Estimated Creatinine Clearance: 63.4 mL/min (by C-G formula based on SCr of 0.68 mg/dL). Liver Function Tests: Recent Labs  Lab 12/15/23 1917 12/17/23 0432  AST 37 29  ALT 27 19  ALKPHOS 90 59  BILITOT 0.5 0.9  PROT 7.1 5.8*  ALBUMIN 3.9 3.2*   No results for input(s): "LIPASE", "AMYLASE" in the last 168 hours. No results for input(s): "AMMONIA" in the last 168 hours. Coagulation Profile: Recent Labs  Lab 12/15/23 2107  INR 1.0   Cardiac  Enzymes: No results for input(s): "CKTOTAL", "CKMB", "CKMBINDEX", "TROPONINI" in the last 168 hours. BNP (last 3 results) No results for input(s): "PROBNP" in the last 8760 hours. HbA1C: No results for input(s): "HGBA1C" in the last 72 hours. CBG: No results for input(s): "GLUCAP" in the last 168 hours. Lipid Profile: No results for input(s): "CHOL", "HDL", "LDLCALC", "TRIG", "CHOLHDL", "LDLDIRECT" in the last 72 hours. Thyroid Function Tests: Recent Labs    12/17/23 0432  TSH 0.482   Anemia Panel: No results for input(s): "VITAMINB12", "FOLATE", "FERRITIN", "TIBC", "IRON", "RETICCTPCT" in the last 72 hours. Sepsis Labs: No results for input(s): "PROCALCITON", "LATICACIDVEN" in the last 168 hours.  No results found for this or any previous visit (from the past 240 hours).   Radiology Studies: DG FEMUR PORT MIN 2 VIEWS LEFT Result Date: 12/16/2023 CLINICAL DATA:  Femur fracture, postop. EXAM: LEFT FEMUR PORTABLE 2 VIEWS COMPARISON:  Preoperative imaging FINDINGS: Femoral intramedullary nail with trans trochanteric and distal locking screw fixation traverse intertrochanteric femur fracture. Improved fracture alignment from preoperative imaging. The bones are diffusely under mineralized. Questionable lucent lesion in the proximal femoral shaft, fixated by femoral nail. Recent postsurgical change includes air and edema in the soft tissues. IMPRESSION: 1. ORIF of intertrochanteric femur fracture with improved alignment. 2. Questionable lucent lesion in the proximal femoral shaft, fixated by femoral nail. Recommend attention on follow-up. Electronically Signed   By: Narda Rutherford M.D.   On: 12/16/2023 14:54   DG FEMUR MIN 2 VIEWS LEFT Result Date: 12/16/2023 CLINICAL DATA:  Elective surgery. EXAM: LEFT FEMUR 2 VIEWS COMPARISON:  Preoperative imaging FINDINGS: Seven fluoroscopic spot views of the left femur submitted from the operating room. Femoral intramedullary nail with trans trochanteric  and distal locking screw fixation traverse proximal femur fracture. Fluoroscopy time 1 minutes 47 seconds. Dose 9.94 mGy. IMPRESSION: Intraoperative fluoroscopy during proximal femur fracture fixation. Electronically Signed   By: Narda Rutherford M.D.   On: 12/16/2023 14:52   DG C-Arm 1-60 Min-No Report Result Date: 12/16/2023 Fluoroscopy was utilized by the requesting physician.  No radiographic interpretation.   CT Head Wo Contrast Result Date: 12/15/2023 CLINICAL DATA:  Larey Seat EXAM: CT HEAD WITHOUT CONTRAST TECHNIQUE: Contiguous axial images were obtained from the base of the skull through the vertex without intravenous contrast. RADIATION DOSE REDUCTION: This exam was performed according to the departmental dose-optimization program which includes automated exposure control, adjustment of the mA and/or kV according to patient size and/or use of iterative reconstruction technique. COMPARISON:  02/27/2022 FINDINGS: Brain: Stable scattered  hypodensities throughout the periventricular white matter consistent with chronic small vessel ischemic changes. No evidence of acute infarct or hemorrhage. The lateral ventricles and midline structures are unremarkable. No acute extra-axial fluid collections. No mass effect. Vascular: No hyperdense vessel or unexpected calcification. Skull: Normal. Negative for fracture or focal lesion. Sinuses/Orbits: No acute finding. Other: None. IMPRESSION: 1. Stable head CT, no acute intracranial process. Electronically Signed   By: Sharlet Salina M.D.   On: 12/15/2023 21:34   DG Shoulder Right Result Date: 12/15/2023 CLINICAL DATA:  Fall, pain EXAM: RIGHT SHOULDER - 2+ VIEW COMPARISON:  None Available. FINDINGS: There is a left humeral neck fracture with marked angulation and displacement. No definite dislocation. AC joint is intact. IMPRESSION: Markedly angulated and displaced right humeral neck fracture. Electronically Signed   By: Charlett Nose M.D.   On: 12/15/2023 19:24   DG  Chest 1 View Result Date: 12/15/2023 CLINICAL DATA:  Fall, left hip fracture EXAM: CHEST  1 VIEW COMPARISON:  09/04/2022 FINDINGS: Right Port-A-Cath remains in place, unchanged. Heart and mediastinal contours are within normal limits. Aortic atherosclerosis. No confluent airspace opacities, effusions or edema. No acute bony abnormality. Old healed left rib fracture. IMPRESSION: No active disease. Electronically Signed   By: Charlett Nose M.D.   On: 12/15/2023 19:23   DG Hip Unilat W or Wo Pelvis 2-3 Views Left Result Date: 12/15/2023 CLINICAL DATA:  Fall.  Left hip pain.  Left leg shortening. EXAM: DG HIP (WITH OR WITHOUT PELVIS) 2-3V LEFT COMPARISON:  None Available. FINDINGS: There is a left femoral intertrochanteric fracture with severe angulation. No subluxation or dislocation. Hip joints and SI joints symmetric and unremarkable. IMPRESSION: Left femoral intertrochanteric fracture with severe varus angulation. Electronically Signed   By: Charlett Nose M.D.   On: 12/15/2023 19:21   Scheduled Meds:  acetaminophen  650 mg Oral Q6H   Or   acetaminophen  650 mg Rectal Q6H   amiodarone  200 mg Oral QHS   apixaban  5 mg Oral BID   chlorhexidine  15 mL Mouth/Throat Once   Or   mouth rinse  15 mL Mouth Rinse Once   cholecalciferol  1,000 Units Oral Daily   docusate sodium  100 mg Oral BID   folic acid  1 mg Oral Daily   metoprolol succinate  25 mg Oral QHS   mirabegron ER  50 mg Oral Daily   mirtazapine  7.5 mg Oral QHS   multivitamin with minerals  1 tablet Oral Daily   thiamine  100 mg Oral Daily   Or   thiamine  100 mg Intravenous Daily   Continuous Infusions:   LOS: 2 days   Marguerita Merles, DO [facility] Available via Epic secure chat 7am-7pm After these hours, please refer to coverage provider listed on [email or web address] 12/17/2023, 5:06 PM

## 2023-12-17 NOTE — Progress Notes (Signed)
 PHARMACY - ANTICOAGULATION CONSULT NOTE  Pharmacy Consult for apixaban Indication: atrial fibrillation (POD#1)  No Known Allergies  Patient Measurements: Height: 5' 6.5" (168.9 cm) Weight: 61.2 kg (135 lb) IBW/kg (Calculated) : 60.45  Vital Signs: Temp: 98.2 F (36.8 C) (03/21 2357) Temp Source: Oral (03/21 1957) BP: 113/68 (03/22 0357) Pulse Rate: 110 (03/22 0357)  Labs: Recent Labs    12/15/23 1847 12/15/23 1917 12/15/23 2107 12/16/23 0554 12/17/23 0432  HGB 13.7  --   --  11.6* 9.6*  HCT 41.0  --   --  35.3* 29.2*  PLT 296  --   --  267 236  LABPROT  --   --  13.2  --   --   INR  --   --  1.0  --   --   CREATININE  --  0.63  --  0.55 0.68  TROPONINIHS  --  5 4  --   --     Estimated Creatinine Clearance: 63.4 mL/min (by C-G formula based on SCr of 0.68 mg/dL).   Medical History: Past Medical History:  Diagnosis Date   A-fib [facility])    Acute diastolic congestive heart failure, NYHA class 2 (HCC) 11/11/2020   Anemia 11/22/2017   Anxiety 11/19/2020   Arthritis    Asymptomatic PVD (peripheral vascular disease) (HCC) 12/14/2016   Suspected.  Hands hyperemic with decreased cap refill. ? History vasospasm   COPD with acute exacerbation (HCC) 01/14/2021   Encounter to establish care 11/19/2020   FRACTURE, TIBIAL PLATEAU 05/11/2010   Qualifier: Diagnosis of  By: Romeo Apple MD, [place]     Lung cancer, primary, with metastasis from lung to other site [facility]) 01/16/2018   Metastatic to the liver   Nuclear sclerotic cataract of both eyes 06/08/2018   Osteopenia 05/15/2013   Osteoporosis    osteopenia   Tobacco abuse 12/14/2016    Medications:  Medications Prior to Admission  Medication Sig Dispense Refill Last Dose/Taking   acetaminophen (TYLENOL) 500 MG tablet Take 1,000 mg by mouth every 6 (six) hours as needed for mild pain (pain score 1-3).   Past Week   amiodarone (PACERONE) 200 MG tablet Take 200 mg by mouth daily.   12/14/2023 Bedtime   apixaban (ELIQUIS) 5  MG TABS tablet Take 5 mg by mouth 2 (two) times daily.   Past Month   furosemide (LASIX) 20 MG tablet Take 1 tablet (20 mg total) by mouth daily. 7 tablet 0 12/14/2023 Bedtime   metoprolol succinate (TOPROL-XL) 25 MG 24 hr tablet Take 25 mg by mouth daily.   12/14/2023 Bedtime   mirabegron ER (MYRBETRIQ) 50 MG TB24 tablet Take 1 tablet (50 mg total) by mouth daily. 30 tablet 5 12/15/2023 Morning   mirtazapine (REMERON) 7.5 MG tablet Take 1 tablet (7.5 mg total) by mouth at bedtime. 90 tablet 1 12/14/2023 Bedtime   Multiple Vitamins-Minerals (MULTIVITAMIN WITH MINERALS) tablet Take 1 tablet by mouth daily.   Past Month    Assessment: 32 YOF s/p fall required cephalomedullary nailing of left intertrochanteric femur fracture.  She was on apixaban PTA for AF. Consult received to restart POD#1 if Hgb >9  Goal of Therapy:  Monitor platelets by anticoagulation protocol: Yes   Plan:  Restart apixaban 5 mg po bid Monitor for signs of bleeding Pharmacy will sign off consult but continue to monitor making recs prn Thank you  Greta Doom BS, PharmD, BCPS Clinical Pharmacist 12/17/2023 7:21 AM  Contact: [phone] after 3 PM  "Be curious, not judgmental..." -  Debbora Dus

## 2023-12-17 NOTE — Progress Notes (Signed)
 Asked to call patient's husband [name], spoke with him on the phone. According to him, patient was able to tolerate very little with PT this morning, Aitana was very limited by pain. He feels like left knee is at a weird angle. I will plan to order imaging of left knee. Lateral taken post-op appears normal, looks like patient has had previous proximal tibia ORIF.  Janine Ores, PA-C

## 2023-12-17 NOTE — Evaluation (Signed)
 Physical Therapy Evaluation Patient Details Name: Cynthia Kelley MRN: [id number] DOB: June 29, 1956 Today's Date: 12/17/2023  History of Present Illness  Cynthia Kelley is a 68 y.o. female presented to the ED with complaints of a fall on the tile floor in her bathroom today. Pelvic x-ray shows left femoral intertrochanteric fracture with severe varus angulation. Pt is now s/p IM nail on 3/21. Past medical history significant for alcohol abuse, atrial flutter, dementia, COPD, diastolic CHF.  Clinical Impression  Pt presents with admitting diagnosis above. Pt today with very poor command following and attempting to stand multiple times impulsively. Pt finally able to come upright with Mod A however unable to take steps due to pain and extreme L knee valgus. Pt required Max A to laterally scoot along EOB. PTA pt husband reports that she was mostly independent with no AD. Patient will benefit from continued inpatient follow up therapy, <3 hours/day. PT will continue to follow.          If plan is discharge home, recommend the following: Two people to help with walking and/or transfers;A lot of help with bathing/dressing/bathroom;Assistance with cooking/housework;Direct supervision/assist for medications management;Assist for transportation;Help with stairs or ramp for entrance;Supervision due to cognitive status   Can travel by private vehicle   No    Equipment Recommendations Rolling walker (2 wheels);Wheelchair (measurements PT);Wheelchair cushion (measurements PT);Hoyer lift;Hospital bed  Recommendations for Other Services       Functional Status Assessment Patient has had a recent decline in their functional status and demonstrates the ability to make significant improvements in function in a reasonable and predictable amount of time.     Precautions / Restrictions Precautions Precautions: Fall Recall of Precautions/Restrictions: Impaired Restrictions Weight Bearing Restrictions Per Provider  Order: Yes LLE Weight Bearing Per Provider Order: Weight bearing as tolerated      Mobility  Bed Mobility Overal bed mobility: Needs Assistance Bed Mobility: Supine to Sit, Sit to Supine     Supine to sit: +2 for physical assistance, Max assist Sit to supine: Max assist   General bed mobility comments: +2 Max A via helicopter method. Pt unable to move LLE.    Transfers Overall transfer level: Needs assistance Equipment used: Rolling walker (2 wheels) Transfers: Sit to/from Stand, Bed to chair/wheelchair/BSC Sit to Stand: Mod assist          Lateral/Scoot Transfers: Max assist General transfer comment: Pt with very poor command following and attempting to stand multiple times impulsively. Pt finally able to come upright with Mod A however unable to take steps due to pain and extreme L knee valgus. Pt required Max A to laterally scoot along EOB.    Ambulation/Gait               General Gait Details: pt unable  Stairs            Wheelchair Mobility     Tilt Bed    Modified Rankin (Stroke Patients Only)       Balance Overall balance assessment: Needs assistance Sitting-balance support: Bilateral upper extremity supported, Feet supported Sitting balance-Leahy Scale: Poor     Standing balance support: Bilateral upper extremity supported, During functional activity Standing balance-Leahy Scale: Poor Standing balance comment: Reliant on external support                             Pertinent Vitals/Pain Pain Assessment Pain Assessment: 0-10 Pain Score: 10-Worst pain ever Pain Location: L hip Pain Descriptors /  Indicators: Constant, Discomfort, Grimacing Pain Intervention(s): Monitored during session, Limited activity within patient's tolerance, Premedicated before session    Home Living Family/patient expects to be discharged to:: Private residence Living Arrangements: Spouse/significant other Available Help at Discharge:  Family;Available 24 hours/day Type of Home: House Home Access: Stairs to enter Entrance Stairs-Rails: None Entrance Stairs-Number of Steps: 1   Home Layout: One level Home Equipment: Agricultural consultant (2 wheels);Hand held shower head;Grab bars - tub/shower;Shower seat - built in      Prior Function Prior Level of Function : Independent/Modified Independent;History of Falls (last six months)             Mobility Comments: Ind no AD ADLs Comments: Pt was sponge bathing on her own.     Extremity/Trunk Assessment   Upper Extremity Assessment Upper Extremity Assessment: RUE deficits/detail RUE Deficits / Details: Chronic R shoulder fx    Lower Extremity Assessment Lower Extremity Assessment: LLE deficits/detail LLE Deficits / Details: L hip fx, Extreme knee valgus    Cervical / Trunk Assessment Cervical / Trunk Assessment: Kyphotic  Communication   Communication Communication: Impaired Factors Affecting Communication: Reduced clarity of speech    Cognition Arousal: Lethargic Behavior During Therapy: Impulsive, Restless, Flat affect   PT - Cognitive impairments: History of cognitive impairments                       PT - Cognition Comments: Hx of dementia and husband reports that she is likely having ETOH withdrawal. Following commands: Impaired Following commands impaired: Follows one step commands inconsistently, Follows multi-step commands inconsistently     Cueing Cueing Techniques: Verbal cues, Tactile cues     General Comments General comments (skin integrity, edema, etc.): VSS    Exercises     Assessment/Plan    PT Assessment Patient needs continued PT services  PT Problem List Decreased strength;Decreased range of motion;Decreased activity tolerance;Decreased balance;Decreased coordination;Decreased mobility;Decreased cognition;Decreased knowledge of use of DME;Decreased safety awareness;Decreased knowledge of precautions;Cardiopulmonary status  limiting activity       PT Treatment Interventions DME instruction;Gait training;Stair training;Functional mobility training;Therapeutic activities;Therapeutic exercise;Balance training;Neuromuscular re-education;Cognitive remediation;Patient/family education    PT Goals (Current goals can be found in the Care Plan section)  Acute Rehab PT Goals Patient Stated Goal: to get better PT Goal Formulation: With patient/family Time For Goal Achievement: 12/31/23 Potential to Achieve Goals: Fair    Frequency Min 3X/week     Co-evaluation               AM-PAC PT "6 Clicks" Mobility  Outcome Measure Help needed turning from your back to your side while in a flat bed without using bedrails?: A Lot Help needed moving from lying on your back to sitting on the side of a flat bed without using bedrails?: A Lot Help needed moving to and from a bed to a chair (including a wheelchair)?: Total Help needed standing up from a chair using your arms (e.g., wheelchair or bedside chair)?: A Lot Help needed to walk in hospital room?: Total Help needed climbing 3-5 steps with a railing? : Total 6 Click Score: 9    End of Session Equipment Utilized During Treatment: Gait belt Activity Tolerance: Patient limited by pain Patient left: in bed;with call bell/phone within reach;with bed alarm set;with family/visitor present Nurse Communication: Mobility status PT Visit Diagnosis: Other abnormalities of gait and mobility (R26.89)    Time: 1610-9604 PT Time Calculation (min) (ACUTE ONLY): 31 min   Charges:   PT  Evaluation $PT Eval Moderate Complexity: 1 Mod PT Treatments $Therapeutic Activity: 8-22 mins PT General Charges $$ ACUTE PT VISIT: 1 Visit         Shela Nevin, PT, DPT Acute Rehab Services [phone]   Gladys Damme 12/17/2023, 4:17 PM

## 2023-12-17 NOTE — Hospital Course (Addendum)
 The patient is a 68 year old Caucasian female with past medical history significant for Mannam to alcohol abuse, atrial flutter, dementia, COPD, diastolic CHF and other comorbidities who presents with complaint of a fall.  Initially presented to [facility] and transferred to [facility] for further evaluation.  Patient drinks 6 alcoholic beverages daily and was intoxicated prior to coming in.  Further workup was done and she was found to have a left intratrochanteric femur fracture and orthopedic surgery was consulted and she is now status post cephalomedullary nailing.  Hospitalization has been complicated by postoperative acute blood loss anemia, alcohol intoxication with concern for withdrawal and A-fib with RVR.  She is now having trouble voiding and will be doing In-N-Out cath and if she feels the third In-N-Out cath we will place a Foley.  Assessment and Plan:  Closed left intertrochanteric femur fracture (HCC) in the setting of Fall s/p Cephalomedullary Nailing: Patient intoxicated with alcohol on admit. Pelvic x-ray >  Left femoral intertrochanteric fracture with severe varus angulation.  -Head CT negative for acute abnormality.   -She has chronic right femur fracture , sustained from a fall, that has been present since 6/82023, per notes patient and spouse opted for nonoperative management.  -Now s/p surgical intervention and a deep bone biopsy and further care per orthopedic surgery. AC w/ Apixaban has been resumed.   -Pain control with Acetaminophen 650 mg po/RC q6hprn, Oxycodone IR 5-10 mg q4hprn Moderate Pain, and IV Hydromorphone 0.5-1 mg q4hprn Severe Pain. C/w Metaclopramide 5-10 mg po/IV q8hprn and Ondansetron 4 mg po/IV q6hprn Nausea.  -C/w Bowel Regimen. WBAT LLE and PT/OT recommending SNF. -She now has left knee pain the orthopedic surgery team is ordering her x-ray   Alcohol intoxication (HCC) with Concern for Withdrawal: Blood alcohol level of 91 at time of admission.  er records and  husband reports patient drinks approximately 6 alcoholic beverage per day. Continue CIWA protocol. C/w MVI+Minerals 1 tab po daily, Folic Acid 1 mg po Daily and Thiamine 100 mg po/IV. Cessation counseling provided.   Paroxysmal Atrial Fibrillation (HCC) w/ RVR: EKG shows atrial flutter at time of admission. TSH is now 0.482. Off of Apixaban for approximately 1 week Prior to Surgery but resumed 3/22 by Ortho at 5 mg po BID. C/w Amiodarone 200 mg po at bedtime and c/w Metoprolol Succinate 25 mg po at bedtime. CTM on Telemetry   Chronic Diastolic CHF (congestive heart failure) (HCC): Stable and compensated. Follow daily weights, strict I's and O's and maintain adequate hydration. Given a 1 Liter bolus today given HR. CTM for S/Sx of Volume Overload. Holding Furosemide 20 mg po Daily. Outpatient follow-up with cardiology service recommended.  HypoNa+: Na+ is now stable around 133. CTM and Trend and repeat CMP in the AM  Leukocytosis: WBC went from 8.0 -> 11.1. Likely reactive in the setting of Surgery. CTM for S/Sx of Infection. Repeat CBC in the AM  Vitamin D Deficiency: Vitamin D Level was 20.33. Start Cholecalciferol 1,000 international units  Daily   Post-Operative ABLA: Hgb/Hct went from 11.6/35.3 -> 9.6/29.2. Check Anemia Panel in the AM. CTM for S/Sx of Bleeding; No overt bleeding noted.   COPD: Currently not decompensated. CTM Respiratory carefully and will utilize bronchodilator management and the use of oxygen supplementation if necessary  Dementia: C/w Mirtazapine 7.5 mg po at bedtime. Delirium Precautions. Limit Narcotics if able  Primary Lung Cancer with Metastasis from Lung to Other Site: Not receiving active treatment currently.  Will need follow-up with primary medical  oncology team @ [facility]   Hypoalbuminemia: Albumin went from 3.9 -> 3.2. CTM and Trend and repeat CMP in the AM:

## 2023-12-17 NOTE — Progress Notes (Addendum)
 Subjective: 1 Day Post-Op s/p Procedure(s): FIXATION, FRACTURE, INTERTROCHANTERIC, WITH INTRAMEDULLARY ROD   Patient laying in bed, moaning. Not responsive to questioning.   Objective:  PE: VITALS:   Vitals:   12/16/23 2357 12/17/23 0357 12/17/23 0826 12/17/23 1115  BP: (!) 110/45 113/68 119/65 (!) 105/56  Pulse: 98 (!) 110 (!) 124 (!) 107  Resp: 16  18 18   Temp: 98.2 F (36.8 C)  98 F (36.7 C) 97.8 F (36.6 C)  TempSrc:   Axillary Oral  SpO2: 96% 97% 97% 98%  Weight:      Height:        ABD soft Intact pulses distally Dorsiflexion/Plantar flexion intact Incision: dressing C/D/I  LABS  Results for orders placed or performed during the hospital encounter of 12/15/23 (from the past 24 hours)  CBC     Status: Abnormal   Collection Time: 12/17/23  4:32 AM  Result Value Ref Range   WBC 11.1 (H) 4.0 - 10.5 K/uL   RBC 2.97 (L) 3.87 - 5.11 MIL/uL   Hemoglobin 9.6 (L) 12.0 - 15.0 g/dL   HCT 16.1 (L) 09.6 - 04.5 %   MCV 98.3 80.0 - 100.0 fL   MCH 32.3 26.0 - 34.0 pg   MCHC 32.9 30.0 - 36.0 g/dL   RDW 40.9 81.1 - 91.4 %   Platelets 236 150 - 400 K/uL   nRBC 0.0 0.0 - 0.2 %  Basic metabolic panel     Status: Abnormal   Collection Time: 12/17/23  4:32 AM  Result Value Ref Range   Sodium 133 (L) 135 - 145 mmol/L   Potassium 4.2 3.5 - 5.1 mmol/L   Chloride 106 98 - 111 mmol/L   CO2 22 22 - 32 mmol/L   Glucose, Bld 146 (H) 70 - 99 mg/dL   BUN 13 8 - 23 mg/dL   Creatinine, Ser 7.82 0.44 - 1.00 mg/dL   Calcium 8.5 (L) 8.9 - 10.3 mg/dL   GFR, Estimated >95 >62 mL/min   Anion gap 5 5 - 15  Magnesium     Status: None   Collection Time: 12/17/23  4:32 AM  Result Value Ref Range   Magnesium 1.8 1.7 - 2.4 mg/dL  Phosphorus     Status: None   Collection Time: 12/17/23  4:32 AM  Result Value Ref Range   Phosphorus 3.9 2.5 - 4.6 mg/dL  Hepatic function panel     Status: Abnormal   Collection Time: 12/17/23  4:32 AM  Result Value Ref Range   Total Protein 5.8 (L)  6.5 - 8.1 g/dL   Albumin 3.2 (L) 3.5 - 5.0 g/dL   AST 29 15 - 41 U/L   ALT 19 0 - 44 U/L   Alkaline Phosphatase 59 38 - 126 U/L   Total Bilirubin 0.9 0.0 - 1.2 mg/dL   Bilirubin, Direct 0.2 0.0 - 0.2 mg/dL   Indirect Bilirubin 0.7 0.3 - 0.9 mg/dL    DG FEMUR PORT MIN 2 VIEWS LEFT Result Date: 12/16/2023 CLINICAL DATA:  Femur fracture, postop. EXAM: LEFT FEMUR PORTABLE 2 VIEWS COMPARISON:  Preoperative imaging FINDINGS: Femoral intramedullary nail with trans trochanteric and distal locking screw fixation traverse intertrochanteric femur fracture. Improved fracture alignment from preoperative imaging. The bones are diffusely under mineralized. Questionable lucent lesion in the proximal femoral shaft, fixated by femoral nail. Recent postsurgical change includes air and edema in the soft tissues. IMPRESSION: 1. ORIF of intertrochanteric femur fracture with improved alignment. 2. Questionable lucent lesion  in the proximal femoral shaft, fixated by femoral nail. Recommend attention on follow-up. Electronically Signed   By: Narda Rutherford M.D.   On: 12/16/2023 14:54   DG FEMUR MIN 2 VIEWS LEFT Result Date: 12/16/2023 CLINICAL DATA:  Elective surgery. EXAM: LEFT FEMUR 2 VIEWS COMPARISON:  Preoperative imaging FINDINGS: Seven fluoroscopic spot views of the left femur submitted from the operating room. Femoral intramedullary nail with trans trochanteric and distal locking screw fixation traverse proximal femur fracture. Fluoroscopy time 1 minutes 47 seconds. Dose 9.94 mGy. IMPRESSION: Intraoperative fluoroscopy during proximal femur fracture fixation. Electronically Signed   By: Narda Rutherford M.D.   On: 12/16/2023 14:52   DG C-Arm 1-60 Min-No Report Result Date: 12/16/2023 Fluoroscopy was utilized by the requesting physician.  No radiographic interpretation.   CT Head Wo Contrast Result Date: 12/15/2023 CLINICAL DATA:  Larey Seat EXAM: CT HEAD WITHOUT CONTRAST TECHNIQUE: Contiguous axial images were  obtained from the base of the skull through the vertex without intravenous contrast. RADIATION DOSE REDUCTION: This exam was performed according to the departmental dose-optimization program which includes automated exposure control, adjustment of the mA and/or kV according to patient size and/or use of iterative reconstruction technique. COMPARISON:  02/27/2022 FINDINGS: Brain: Stable scattered hypodensities throughout the periventricular white matter consistent with chronic small vessel ischemic changes. No evidence of acute infarct or hemorrhage. The lateral ventricles and midline structures are unremarkable. No acute extra-axial fluid collections. No mass effect. Vascular: No hyperdense vessel or unexpected calcification. Skull: Normal. Negative for fracture or focal lesion. Sinuses/Orbits: No acute finding. Other: None. IMPRESSION: 1. Stable head CT, no acute intracranial process. Electronically Signed   By: Sharlet Salina M.D.   On: 12/15/2023 21:34   DG Shoulder Right Result Date: 12/15/2023 CLINICAL DATA:  Fall, pain EXAM: RIGHT SHOULDER - 2+ VIEW COMPARISON:  None Available. FINDINGS: There is a left humeral neck fracture with marked angulation and displacement. No definite dislocation. AC joint is intact. IMPRESSION: Markedly angulated and displaced right humeral neck fracture. Electronically Signed   By: Charlett Nose M.D.   On: 12/15/2023 19:24   DG Chest 1 View Result Date: 12/15/2023 CLINICAL DATA:  Fall, left hip fracture EXAM: CHEST  1 VIEW COMPARISON:  09/04/2022 FINDINGS: Right Port-A-Cath remains in place, unchanged. Heart and mediastinal contours are within normal limits. Aortic atherosclerosis. No confluent airspace opacities, effusions or edema. No acute bony abnormality. Old healed left rib fracture. IMPRESSION: No active disease. Electronically Signed   By: Charlett Nose M.D.   On: 12/15/2023 19:23   DG Hip Unilat W or Wo Pelvis 2-3 Views Left Result Date: 12/15/2023 CLINICAL DATA:   Fall.  Left hip pain.  Left leg shortening. EXAM: DG HIP (WITH OR WITHOUT PELVIS) 2-3V LEFT COMPARISON:  None Available. FINDINGS: There is a left femoral intertrochanteric fracture with severe angulation. No subluxation or dislocation. Hip joints and SI joints symmetric and unremarkable. IMPRESSION: Left femoral intertrochanteric fracture with severe varus angulation. Electronically Signed   By: Charlett Nose M.D.   On: 12/15/2023 19:21    Assessment/Plan: Principal Problem:   Closed left femoral fracture (HCC) Active Problems:   Lung cancer, primary, with metastasis from lung to other site [facility])   Chronic diastolic CHF (congestive heart failure) (HCC)   Dementia with behavioral disturbance (HCC)   Paroxysmal atrial fibrillation (HCC)   Alcohol intoxication (HCC)   1 Day Post-Op s/p Procedure(s): FIXATION, FRACTURE, INTERTROCHANTERIC, WITH INTRAMEDULLARY ROD - drinks 6+ alcoholic beverages a day at baseline, CIWA protocol in place -  ABLA - Hbg 9.6, stable this morning - surgical pathology is still in process, will continue to follow for results  Weightbearing: WBAT LLE Insicional and dressing care: Reinforce dressings as needed VTE prophylaxis: baseline eliquis has been restarted Pain control: continue current regimen, but limit narcotics as much as possible due to patient's baseline dementia Follow - up plan: with Dr. Clare Charon  Contact information:   Janine Ores, PA-C Weekdays 8-5  After hours and holidays please check [email or web address] for group call information for [facility]  Armida Sans 12/17/2023, 11:37 AM

## 2023-12-18 DIAGNOSIS — F03918 Unspecified dementia, unspecified severity, with other behavioral disturbance: Secondary | ICD-10-CM | POA: Diagnosis not present

## 2023-12-18 DIAGNOSIS — S72002A Fracture of unspecified part of neck of left femur, initial encounter for closed fracture: Secondary | ICD-10-CM | POA: Diagnosis not present

## 2023-12-18 DIAGNOSIS — I5032 Chronic diastolic (congestive) heart failure: Secondary | ICD-10-CM | POA: Diagnosis not present

## 2023-12-18 DIAGNOSIS — F10920 Alcohol use, unspecified with intoxication, uncomplicated: Secondary | ICD-10-CM | POA: Diagnosis not present

## 2023-12-18 LAB — COMPREHENSIVE METABOLIC PANEL
ALT: 18 U/L (ref 0–44)
AST: 36 U/L (ref 15–41)
Albumin: 3.5 g/dL (ref 3.5–5.0)
Alkaline Phosphatase: 58 U/L (ref 38–126)
Anion gap: 10 (ref 5–15)
BUN: 9 mg/dL (ref 8–23)
CO2: 24 mmol/L (ref 22–32)
Calcium: 9.3 mg/dL (ref 8.9–10.3)
Chloride: 104 mmol/L (ref 98–111)
Creatinine, Ser: 0.61 mg/dL (ref 0.44–1.00)
GFR, Estimated: >60 mL/min (ref >60)
Glucose, Bld: 91 mg/dL (ref 70–99)
Potassium: 3.3 mmol/L — ABNORMAL LOW (ref 3.5–5.1)
Sodium: 138 mmol/L (ref 135–145)
Total Bilirubin: 1.3 mg/dL — ABNORMAL HIGH (ref 0.0–1.2)
Total Protein: 6.3 g/dL — ABNORMAL LOW (ref 6.5–8.1)

## 2023-12-18 LAB — CBC WITH DIFFERENTIAL/PLATELET
Abs Immature Granulocytes: 0.02 10*3/uL (ref 0.00–0.07)
Basophils Absolute: 0 10*3/uL (ref 0.0–0.1)
Basophils Relative: 1 %
Eosinophils Absolute: 0 10*3/uL (ref 0.0–0.5)
Eosinophils Relative: 1 %
HCT: 28.3 % — ABNORMAL LOW (ref 36.0–46.0)
Hemoglobin: 9.3 g/dL — ABNORMAL LOW (ref 12.0–15.0)
Immature Granulocytes: 0 %
Lymphocytes Relative: 17 %
Lymphs Abs: 1.3 10*3/uL (ref 0.7–4.0)
MCH: 32.3 pg (ref 26.0–34.0)
MCHC: 32.9 g/dL (ref 30.0–36.0)
MCV: 98.3 fL (ref 80.0–100.0)
Monocytes Absolute: 0.8 10*3/uL (ref 0.1–1.0)
Monocytes Relative: 10 %
Neutro Abs: 5.6 10*3/uL (ref 1.7–7.7)
Neutrophils Relative %: 71 %
Platelets: 223 10*3/uL (ref 150–400)
RBC: 2.88 MIL/uL — ABNORMAL LOW (ref 3.87–5.11)
RDW: 13.5 % (ref 11.5–15.5)
WBC: 7.9 10*3/uL (ref 4.0–10.5)
nRBC: 0 % (ref 0.0–0.2)

## 2023-12-18 LAB — IRON AND TIBC
Iron: 24 ug/dL — ABNORMAL LOW (ref 28–170)
Saturation Ratios: 8 % — ABNORMAL LOW (ref 10.4–31.8)
TIBC: 286 ug/dL (ref 250–450)
UIBC: 262 ug/dL

## 2023-12-18 LAB — VITAMIN B12: Vitamin B-12: 339 pg/mL (ref 180–914)

## 2023-12-18 LAB — RETICULOCYTES
Immature Retic Fract: 5.5 % (ref 2.3–15.9)
RBC.: 2.85 MIL/uL — ABNORMAL LOW (ref 3.87–5.11)
Retic Count, Absolute: 53.3 10*3/uL (ref 19.0–186.0)
Retic Ct Pct: 1.9 % (ref 0.4–3.1)

## 2023-12-18 LAB — FERRITIN: Ferritin: 416 ng/mL — ABNORMAL HIGH (ref 11–307)

## 2023-12-18 LAB — PHOSPHORUS: Phosphorus: 2.7 mg/dL (ref 2.5–4.6)

## 2023-12-18 LAB — MAGNESIUM: Magnesium: 1.8 mg/dL (ref 1.7–2.4)

## 2023-12-18 LAB — FOLATE: Folate: 16.9 ng/mL (ref >5.9)

## 2023-12-18 MED ORDER — POTASSIUM CHLORIDE CRYS ER 20 MEQ PO TBCR
40.0000 meq | EXTENDED_RELEASE_TABLET | Freq: Two times a day (BID) | ORAL | Status: AC
Start: 2023-12-18 — End: 2023-12-18
  Administered 2023-12-18 (×2): 40 meq via ORAL
  Filled 2023-12-18 (×2): qty 2

## 2023-12-18 MED ORDER — POLYSACCHARIDE IRON COMPLEX 150 MG PO CAPS
150.0000 mg | ORAL_CAPSULE | Freq: Every day | ORAL | Status: DC
  Administered 2023-12-18 – 2023-12-25 (×8): 150 mg via ORAL
  Filled 2023-12-18 (×8): qty 1

## 2023-12-18 MED ORDER — MAGNESIUM SULFATE 2 GM/50ML IV SOLN
2.0000 g | Freq: Once | INTRAVENOUS | Status: AC
  Administered 2023-12-18: 2 g via INTRAVENOUS
  Filled 2023-12-18: qty 50

## 2023-12-18 MED ORDER — CHLORHEXIDINE GLUCONATE CLOTH 2 % EX PADS
6.0000 | MEDICATED_PAD | Freq: Every day | CUTANEOUS | Status: DC
  Administered 2023-12-18 – 2023-12-23 (×6): 6 via TOPICAL

## 2023-12-18 NOTE — Progress Notes (Signed)
    Subjective: Patient resting comfortably. Hasn't been able to mobilize OOB with PT.   L knee xray done and shows severe tricompartmental degenerative changes of the knee. No acute displaced fracture or dislocation.  Objective:   VITALS:   Vitals:   12/17/23 2027 12/17/23 2101 12/18/23 0144 12/18/23 0817  BP: (!) 143/82 (!) 143/82 (!) 139/53 124/80  Pulse: (!) 120 (!) 120 99 (!) 114  Resp:    18  Temp:   98.2 F (36.8 C) 99.5 F (37.5 C)  TempSrc:    Oral  SpO2:   96% 97%  Weight:      Height:          Latest Ref Rng & Units 12/18/2023    5:43 AM 12/17/2023    4:32 AM 12/16/2023    5:54 AM  CBC  WBC 4.0 - 10.5 K/uL 7.9  11.1  8.0   Hemoglobin 12.0 - 15.0 g/dL 9.3  9.6  16.1   Hematocrit 36.0 - 46.0 % 28.3  29.2  35.3   Platelets 150 - 400 K/uL 223  236  267       Latest Ref Rng & Units 12/18/2023    5:43 AM 12/17/2023    4:32 AM 12/16/2023    5:54 AM  BMP  Glucose 70 - 99 mg/dL 91  096  045   BUN 8 - 23 mg/dL 9  13  20    Creatinine 0.44 - 1.00 mg/dL 4.09  8.11  9.14   Sodium 135 - 145 mmol/L 138  133  133   Potassium 3.5 - 5.1 mmol/L 3.3  4.2  4.0   Chloride 98 - 111 mmol/L 104  106  103   CO2 22 - 32 mmol/L 24  22  23    Calcium 8.9 - 10.3 mg/dL 9.3  8.5  7.4    Intake/Output      03/22 0701 03/23 0700 03/23 0701 03/24 0700   P.O. 240    I.V. (mL/kg) 74.6 (1.2)    IV Piggyback 100    Total Intake(mL/kg) 414.6 (6.8)    Urine (mL/kg/hr) 1500 (1)    Blood     Total Output 1500    Net -1085.4            Physical Exam: General: NAD.  Sleeping comfortably in bed, calm Resp: No increased wob Cardio: regular rate and rhythm ABD soft Neurologically intact MSK Neurovascularly intact Sensation intact distally Intact pulses distally Dorsiflexion/Plantar flexion intact Incision: dressing small area of dried blood on gauze pad   Assessment: 2 Days Post-Op  S/P Procedure(s) (LRB): FIXATION, FRACTURE, INTERTROCHANTERIC, WITH INTRAMEDULLARY ROD (Left) by  Dr. Jena Gauss on 12/16/23  Principal Problem:   Closed left femoral fracture (HCC) Active Problems:   Lung cancer, primary, with metastasis from lung to other site [facility])   Chronic diastolic CHF (congestive heart failure) (HCC)   Dementia with behavioral disturbance (HCC)   Paroxysmal atrial fibrillation (HCC)   Alcohol intoxication (HCC)   Plan:  Advance diet Up with therapy Incentive Spirometry Elevate and Apply ice  Weightbearing: WBAT LLE Insicional and dressing care: Reinforce dressings as needed Orthopedic device(s): None Showering: Keep dressing dry VTE prophylaxis:  Eliquis 5mg  bid  , SCDs, ambulation Pain control: PRN   Dispo:  PT recommending SNF.      Jenne Pane, PA-C Office [phone] 12/18/2023, 10:16 AM

## 2023-12-18 NOTE — TOC Initial Note (Signed)
 Transition of Care [facility]) - Initial/Assessment Note   Patient Details  Name: Cynthia Kelley MRN: [id number] Date of Birth: 01-10-1956  Transition of Care [facility]) CM/SW Contact:    Helene Kelp, LCSW Phone Number: 12/18/2023, 1:50 PM  Clinical Narrative:                 CSW followed-up disposition recommendations (SNF placement).  CSW completed initial TOC work-up/assessment as noted by the following below.   NOTE The patient and husband expressed to the CM they would like a SNF in [place] or [place]. No preferenced name facility provided.   The CSW contacted the patient's natural support: Cynthia Kelley (Spouse) and reviewed the clinical recommendations and assess SNF preference. The natural support expressed no name for a facility, but something close to home, because he is retired and would like to see his wife daily.  CSW referral efforts to support the patient's disposition FL2:  PASRR:  SNF referrals:   TOC Disposition follow-up needs  Please provide the patient or natural support with bed-offer updates.  Please continue with SNF placement efforts. Please update the clinical team to SNF placement efforts:  No other needs identified by this Clinical research associate currently. Patient needs and current disposition to be followed by    Expected Discharge Plan: Skilled Nursing Facility Barriers to Discharge: Continued Medical Work up  Patient Goals and CMS Choice   CMS Medicare.gov Compare Post Acute Care list provided to:: Patient Choice offered to / list presented to : Spouse [place] ownership interest in [facility].provided to:: Spouse    Expected Discharge Plan and Services In-house Referral: Clinical Social Work  Living arrangements for the past 2 months: Single Family Home     Prior Living Arrangements/Services Living arrangements for the past 2 months: Single Family Home Lives with:: Spouse          Need for Family Participation in Patient Care: Yes  (Comment) Care giver support system in place?: Yes (comment)   Criminal Activity/Legal Involvement Pertinent to Current Situation/Hospitalization: No - Comment as needed  Activities of Daily Living      Permission Sought/Granted Permission sought to share information with : Case Manager, Facility Medical sales representative    Share Information with NAME: Terrel Manalo [phone]     Permission granted to share info w Relationship: Spouse  Permission granted to share info w Contact Information: Alanii Ramer  Emotional Assessment       Orientation: : Oriented to Self Alcohol / Substance Use: Not Applicable Psych Involvement: No (comment)  Admission diagnosis:  Closed left femoral fracture (HCC) [S72.92XA] Fall, initial encounter [W19.XXXA] Closed displaced basicervical fracture of left femur, initial encounter [facility]) [S72.042A] Patient Active Problem List   Diagnosis Date Noted   Closed left femoral fracture (HCC) 12/15/2023   Alcohol intoxication (HCC) 12/15/2023   Encounter for general adult medical examination with abnormal findings 12/08/2023   Insomnia 07/01/2023   Overactive bladder 07/01/2023   Dementia with behavioral disturbance (HCC) 02/27/2022   Paroxysmal atrial fibrillation (HCC) 02/27/2022   Anemia 03/03/2021   Thrombocytopenia (HCC) 03/03/2021   COPD with acute exacerbation (HCC) 01/14/2021   Hyponatremia 01/12/2021   Alcohol dependence (HCC) 01/12/2021   Anxiety 11/19/2020   Chronic diastolic CHF (congestive heart failure) (HCC) 11/11/2020   Typical atrial flutter (HCC) 11/11/2020   Oropharyngeal dysphagia 08/13/2020   Colon cancer screening 07/19/2018   Dry eye syndrome of both eyes 06/08/2018   Nuclear sclerotic cataract of both eyes 06/08/2018   Posterior vitreous detachment  of both eyes 06/08/2018   Lung cancer, primary, with metastasis from lung to other site [facility]) 01/16/2018   Malignant neoplasm metastatic to bone (HCC) 11/10/2017   Metastatic  squamous cell carcinoma 10/20/2017   Tobacco abuse 12/14/2016   Osteopenia 05/15/2013   KNEE, ARTHRITIS, DEGEN./OSTEO 05/11/2010   PCP:  Anabel Halon, MD Pharmacy:   [facility] [address] [place] - [address] [place] [address] [place] [address] Phone: [phone] Fax: [phone]     Social Drivers of Health (SDOH) Social History: SDOH Screenings   Food Insecurity: No Food Insecurity (12/16/2023)  Housing: Low Risk  (12/16/2023)  Transportation Needs: No Transportation Needs (12/16/2023)  Utilities: Not At Risk (12/16/2023)  Alcohol Screen: Low Risk  (12/16/2022)  Depression (PHQ2-9): Low Risk  (12/08/2023)  Financial Resource Strain: Low Risk  (12/16/2022)  Physical Activity: Inactive (12/16/2022)  Social Connections: Moderately Integrated (12/16/2023)  Stress: No Stress Concern Present (12/16/2022)  Tobacco Use: High Risk (12/16/2023)   SDOH Interventions:     Readmission Risk Interventions    03/01/2022   11:11 AM  Readmission Risk Prevention Plan  Transportation Screening Complete  HRI or Home Care Consult Complete  Social Work Consult for Recovery Care Planning/Counseling Complete  Palliative Care Screening --  Medication Review [employer]) Complete

## 2023-12-18 NOTE — Progress Notes (Signed)
 0710 patient seeping noted to have dried blood on gown and where IV was suppose to be was covered in guaze and tape. Patient alert to self only on room air. 1030 IV replaced for magnesium to be given and wrapped to help patient from removing

## 2023-12-18 NOTE — Progress Notes (Addendum)
 Met with pt and husband to discuss the DC plan. Pt is asleep. Discussed PT recommendations. Husband agrees with SNF. He is requesting a facility near home in [place] or [place] if unable to find a facility in [place]. Notified Reandra, SW of the DC plan. Will continue to f/u to assist with the DC plan.

## 2023-12-18 NOTE — Plan of Care (Signed)

## 2023-12-18 NOTE — Evaluation (Signed)
 Occupational Therapy Evaluation Patient Details Name: Cynthia Kelley MRN: [id number] DOB: [birth year] Today's Date: 12/18/2023   History of Present Illness   68 y.o. female presented to the ED 3/21 with complaints of a fall on the tile floor in her bathroom today. Pelvic x-ray shows left femoral intertrochanteric fracture with severe varus angulation. Pt is now s/p IM nail on 3/21. PMH alcohol abuse, atrial flutter, dementia with behavorial disturbance, COPD, diastolic CHF, Lung CA with mets to lung     Clinical Impressions Patient is s/p R IMN surgery resulting in functional limitations due to the deficits listed below (see OT problem list). Pt at baseline no DME and supervision with adls. Pt at this time asleep on arrival and noted to have pain medication at 9:35am and ativan at 1am.  Patient will benefit from skilled OT acutely to increase independence and safety with ADLS to allow discharge skilled inpatient follow up therapy, <3 hours/day. .      If plan is discharge home, recommend the following:   Two people to help with walking and/or transfers;Two people to help with bathing/dressing/bathroom     Functional Status Assessment   Patient has had a recent decline in their functional status and demonstrates the ability to make significant improvements in function in a reasonable and predictable amount of time.     Equipment Recommendations   BSC/3in1;Wheelchair (measurements OT);Wheelchair cushion (measurements OT);Hospital bed;Other (comment) (steady)     Recommendations for Other Services   PT consult     Precautions/Restrictions   Precautions Precautions: Fall Recall of Precautions/Restrictions: Impaired Precaution/Restrictions Comments: foley Restrictions Weight Bearing Restrictions Per Provider Order: No LLE Weight Bearing Per Provider Order: Weight bearing as tolerated     Mobility Bed Mobility Overal bed mobility: Needs Assistance Bed Mobility: Rolling,  Supine to Sit, Sit to Supine Rolling: Contact guard assist   Supine to sit: Min assist, HOB elevated Sit to supine: Mod assist, HOB elevated   General bed mobility comments: pt progressed to the R side of the bed with verbal cues and increased time. Pt static sitting supervision. pt requires total (A) of L LE to lift onto bed surface to return to supine. pt is able to bridge buttock with R LE    Transfers Overall transfer level: Needs assistance Equipment used: Rolling walker (2 wheels) Transfers: Sit to/from Stand, Bed to chair/wheelchair/BSC Sit to Stand: Min assist     Step pivot transfers: Mod assist     General transfer comment: pt able to stand from elevated bed surface. pt attempting to step with LLE and reports pain. pt pivots toward R naturally and returning to bed surface      Balance Overall balance assessment: Needs assistance Sitting-balance support: Bilateral upper extremity supported, Feet supported Sitting balance-Leahy Scale: Fair     Standing balance support: Bilateral upper extremity supported, During functional activity, Reliant on assistive device for balance Standing balance-Leahy Scale: Poor                             ADL either performed or assessed with clinical judgement   ADL Overall ADL's : Needs assistance/impaired Eating/Feeding: Set up;Bed level   Grooming: Set up;Bed level   Upper Body Bathing: Moderate assistance   Lower Body Bathing: Total assistance   Upper Body Dressing : Moderate assistance   Lower Body Dressing: Total assistance  Vision Ability to See in Adequate Light: 1 Impaired Vision Assessment?: Vision impaired- to be further tested in functional context Additional Comments: pt using central vision during functional task     Perception         Praxis         Pertinent Vitals/Pain Pain Assessment Pain Assessment: Faces Faces Pain Scale: Hurts even more Pain Location: L  hip Pain Descriptors / Indicators: Discomfort, Grimacing Pain Intervention(s): Premedicated before session, Repositioned, Monitored during session, Limited activity within patient's tolerance     Extremity/Trunk Assessment Upper Extremity Assessment Upper Extremity Assessment: Generalized weakness;Right hand dominant;LUE deficits/detail LUE Deficits / Details: reports I hurt my shoulder   Lower Extremity Assessment Lower Extremity Assessment: Defer to PT evaluation LLE Deficits / Details: incision site covered loosely with a bandage   Cervical / Trunk Assessment Cervical / Trunk Assessment: Kyphotic   Communication Communication Communication: Impaired Factors Affecting Communication: Reduced clarity of speech   Cognition Arousal: Lethargic Behavior During Therapy: Impulsive Cognition: History of cognitive impairments             OT - Cognition Comments: pt reports she is at [employer]. pt has no awareness to injury. pt with attempts to stand states "oh i broke my hip"                 Following commands: Impaired Following commands impaired: Follows one step commands with increased time, Only follows one step commands consistently     Cueing  General Comments   Cueing Techniques: Verbal cues;Gestural cues;Visual cues  RA   Exercises Exercises: Other exercises Other Exercises Other Exercises: ankle pumps bil LE Other Exercises: asking for pravalon boot for LLE to prevent heel injury   Shoulder Instructions      Home Living Family/patient expects to be discharged to:: Skilled nursing facility                                 Additional Comments: no family present and poor historian. TOC notes indicate plan for SNF at this time      Prior Functioning/Environment Prior Level of Function : Independent/Modified Independent;History of Falls (last six months)             Mobility Comments: Ind no AD ADLs Comments: Pt was sponge bathing on her  own.    OT Problem List: Impaired balance (sitting and/or standing);Decreased activity tolerance;Decreased safety awareness;Decreased knowledge of use of DME or AE;Decreased knowledge of precautions;Pain   OT Treatment/Interventions: Self-care/ADL training;Therapeutic exercise;Energy conservation;DME and/or AE instruction;Manual therapy;Modalities;Therapeutic activities;Patient/family education;Balance training      OT Goals(Current goals can be found in the care plan section)   Acute Rehab OT Goals Patient Stated Goal: lay down OT Goal Formulation: Patient unable to participate in goal setting Time For Goal Achievement: 01/01/24 Potential to Achieve Goals: Good   OT Frequency:  Min 2X/week    Co-evaluation              AM-PAC OT "6 Clicks" Daily Activity     Outcome Measure Help from another person eating meals?: A Little Help from another person taking care of personal grooming?: A Little Help from another person toileting, which includes using toliet, bedpan, or urinal?: A Lot Help from another person bathing (including washing, rinsing, drying)?: A Lot Help from another person to put on and taking off regular upper body clothing?: A Lot Help from another person to put on and  taking off regular lower body clothing?: A Lot 6 Click Score: 14   End of Session Equipment Utilized During Treatment: Gait belt;Rolling walker (2 wheels) Nurse Communication: Mobility status;Precautions  Activity Tolerance: Patient tolerated treatment well Patient left: in bed;with call bell/phone within reach;with bed alarm set  OT Visit Diagnosis: Unsteadiness on feet (R26.81)                Time: 1610-9604 OT Time Calculation (min): 15 min Charges:  OT General Charges $OT Visit: 1 Visit OT Evaluation $OT Eval Moderate Complexity: 1 Mod   Brynn, OTR/L  Acute Rehabilitation Services Office: [phone] .   Mateo Flow 12/18/2023, 12:58 PM

## 2023-12-18 NOTE — NC FL2 (Signed)
 [place] MEDICAID FL2 LEVEL OF CARE FORM     IDENTIFICATION  Patient Name: Cynthia Kelley Birthdate: [birth year] Sex: female Admission Date (Current Location): 12/15/2023  [facility] and [place] Number:  Producer, television/film/video and Address:  [facility], [address], [place], [place] [address]      Provider Number: [id number]  Attending Physician Name and Address:  Merlene Laughter, DO  Relative Name and Phone Number:  Spouse Zawadi Aplin    Current Level of Care: SNF Recommended Level of Care: Skilled Nursing Facility Prior Approval Number:    Date Approved/Denied: 12/18/23 PASRR Number: [id number]  Discharge Plan: SNF    Current Diagnoses: Patient Active Problem List   Diagnosis Date Noted   Closed left femoral fracture (HCC) 12/15/2023   Alcohol intoxication (HCC) 12/15/2023   Encounter for general adult medical examination with abnormal findings 12/08/2023   Insomnia 07/01/2023   Overactive bladder 07/01/2023   Dementia with behavioral disturbance (HCC) 02/27/2022   Paroxysmal atrial fibrillation (HCC) 02/27/2022   Anemia 03/03/2021   Thrombocytopenia (HCC) 03/03/2021   COPD with acute exacerbation (HCC) 01/14/2021   Hyponatremia 01/12/2021   Alcohol dependence (HCC) 01/12/2021   Anxiety 11/19/2020   Chronic diastolic CHF (congestive heart failure) (HCC) 11/11/2020   Typical atrial flutter (HCC) 11/11/2020   Oropharyngeal dysphagia 08/13/2020   Colon cancer screening 07/19/2018   Dry eye syndrome of both eyes 06/08/2018   Nuclear sclerotic cataract of both eyes 06/08/2018   Posterior vitreous detachment of both eyes 06/08/2018   Lung cancer, primary, with metastasis from lung to other site [facility]) 01/16/2018   Malignant neoplasm metastatic to bone (HCC) 11/10/2017   Metastatic squamous cell carcinoma 10/20/2017   Tobacco abuse 12/14/2016   Osteopenia 05/15/2013   KNEE, ARTHRITIS, DEGEN./OSTEO 05/11/2010    Orientation RESPIRATION  BLADDER Height & Weight     Self    Incontinent Weight: 135 lb (61.2 kg) Height:  5' 6.5" (168.9 cm)  BEHAVIORAL SYMPTOMS/MOOD NEUROLOGICAL BOWEL NUTRITION STATUS      Continent Diet  AMBULATORY STATUS COMMUNICATION OF NEEDS Skin   Limited Assist Verbally                         Personal Care Assistance Level of Assistance  Dressing, Bathing Bathing Assistance: Limited assistance   Dressing Assistance: Limited assistance     Functional Limitations Info  Sight Sight Info: Impaired        SPECIAL CARE FACTORS FREQUENCY  PT (By licensed PT), OT (By licensed OT)     PT Frequency: 5x OT Frequency: 3x            Contractures Contractures Info: Not present    Additional Factors Info  Code Status Code Status Info: Full             Current Medications (12/18/2023):  This is the current hospital active medication list Current Facility-Administered Medications  Medication Dose Route Frequency Provider Last Rate Last Admin   acetaminophen (TYLENOL) tablet 650 mg  650 mg Oral Q6H Thyra Breed A, PA-C   650 mg at 12/18/23 1430   Or   acetaminophen (TYLENOL) suppository 650 mg  650 mg Rectal Q6H McClung, Sarah A, PA-C       amiodarone (PACERONE) tablet 200 mg  200 mg Oral QHS Thyra Breed A, PA-C   200 mg at 12/17/23 2118   apixaban (ELIQUIS) tablet 5 mg  5 mg Oral BID Reome, Elisha [place], RPH  5 mg at 12/18/23 0935   chlorhexidine (PERIDEX) 0.12 % solution 15 mL  15 mL Mouth/Throat Once Trevor Iha, MD       Or   Oral care mouth rinse  15 mL Mouth Rinse Once Trevor Iha, MD       Chlorhexidine Gluconate Cloth 2 % PADS 6 each  6 each Topical Daily Marguerita Merles [place], [place]   6 each at 12/18/23 1154   cholecalciferol (VITAMIN D3) 25 MCG (1000 UNIT) tablet 1,000 Units  1,000 Units Oral Daily Marguerita Merles [place], DO   1,000 Units at 12/18/23 4098   diphenhydrAMINE (BENADRYL) 12.5 MG/5ML elixir 12.5-25 mg  12.5-25 mg Oral Q4H PRN [place], PA-C        docusate sodium (COLACE) capsule 100 mg  100 mg Oral BID [place], PA-C   100 mg at 12/18/23 0935   folic acid (FOLVITE) tablet 1 mg  1 mg Oral Daily [place] Bali, PA-C   1 mg at 12/18/23 1191   hydrALAZINE (APRESOLINE) tablet 10 mg  10 mg Oral Q6H PRN [place] Bali, PA-C       HYDROmorphone (DILAUDID) injection 0.5-1 mg  0.5-1 mg Intravenous Q4H PRN [place] B[place], PA-C       LORazepam (ATIVAN) tablet 1-4 mg  1-4 mg Oral Q1H PRN [place] Bali, PA-C   1 mg at 12/17/23 2058   Or   LORazepam (ATIVAN) injection 1-4 mg  1-4 mg Intravenous Q1H PRN [place] Bali, PA-C   2 mg at 12/18/23 0150   methocarbamol (ROBAXIN) tablet 500 mg  500 mg Oral Q6H PRN [place] Bali, PA-C       Or   methocarbamol (ROBAXIN) injection 500 mg  500 mg Intravenous Q6H PRN Sharon Seller, Sarah A, PA-C       metoCLOPramide (REGLAN) tablet 5-10 mg  5-10 mg Oral Q8H PRN Sharon Seller, Sarah A, PA-C       Or   metoCLOPramide (REGLAN) injection 5-10 mg  5-10 mg Intravenous Q8H PRN [place] B[place], PA-C       metoprolol succinate (TOPROL-XL) 24 hr tablet 25 mg  25 mg Oral QHS Thyra Breed A, PA-C   25 mg at 12/16/23 2141   metoprolol tartrate (LOPRESSOR) injection 5 mg  5 mg Intravenous Q8H PRN Marguerita Merles Lat[place], DO   5 mg at 12/17/23 1834   mirabegron ER (MYRBETRIQ) tablet 50 mg  50 mg Oral Daily [place] Bali, PA-C   50 mg at 12/18/23 4782   mirtazapine (REMERON) tablet 7.5 mg  7.5 mg Oral QHS [place] Bali, PA-C   7.5 mg at 12/17/23 2154   multivitamin with minerals tablet 1 tablet  1 tablet Oral Daily [place] B[place], PA-C   1 tablet at 12/18/23 0935   ondansetron (ZOFRAN) tablet 4 mg  4 mg Oral Q6H PRN [place] B[place], PA-C       Or   ondansetron (ZOFRAN) injection 4 mg  4 mg Intravenous Q6H PRN Thyra Breed A, PA-C       oxyCODONE (Oxy IR/ROXICODONE) immediate release tablet 5-10 mg  5-10 mg Oral Q4H PRN [place], PA-C   5 mg at 12/18/23 1426   polyethylene glycol (MIRALAX / GLYCOLAX)  packet 17 g  17 g Oral Daily PRN Thyra Breed A, PA-C       potassium chloride SA (KLOR-CON M) CR tablet 40 mEq  40 mEq Oral BID Marguerita Merles [place], DO  40 mEq at 12/18/23 1036   thiamine (VITAMIN B1) tablet 100 mg  100 mg Oral Daily [place] Bali, PA-C   100 mg at 12/18/23 2440   Or   thiamine (VITAMIN B1) injection 100 mg  100 mg Intravenous Daily [place] B[place], PA-C         Discharge Medications: Please see discharge summary for a list of discharge medications.  Relevant Imaging Results:  Relevant Lab Results:   Additional Information SS#: [id number]  Helene Kelp, LCSW

## 2023-12-18 NOTE — Progress Notes (Signed)
 PROGRESS NOTE    Cynthia Kelley  ZOX:[id number] DOB: 07-24-1956 DOA: 12/15/2023 PCP: Anabel Halon, MD   Brief Narrative:  The patient is a 68 year old Caucasian female with past medical history significant for Mannam to alcohol abuse, atrial flutter, dementia, COPD, diastolic CHF and other comorbidities who presents with complaint of a fall.  Initially presented to [facility] and transferred to [facility] for further evaluation.  Patient drinks 6 alcoholic beverages daily and was intoxicated prior to coming in.  Further workup was done and she was found to have a left intratrochanteric femur fracture and orthopedic surgery was consulted and she is now status post cephalomedullary nailing.  Hospitalization has been complicated by postoperative acute blood loss anemia, alcohol intoxication with concern for withdrawal and A-fib with RVR.  She is also having some urinary retention so Foley catheter been placed.  PT OT recommending SNF  Assessment and Plan:  Closed left intertrochanteric femur fracture (HCC) in the setting of Fall s/p Cephalomedullary Nailing: Patient intoxicated with alcohol on admit. Pelvic x-ray >  Left femoral intertrochanteric fracture with severe varus angulation.  -Head CT negative for acute abnormality.   -She has chronic right femur fracture , sustained from a fall, that has been present since 6/82023, per notes patient and spouse opted for nonoperative management.  -Now s/p surgical intervention and a deep bone biopsy and further care per orthopedic surgery. AC w/ Apixaban has been resumed.   -Pain control with Acetaminophen 650 mg po/RC q6hprn, Oxycodone IR 5-10 mg q4hprn Moderate Pain, and IV Hydromorphone 0.5-1 mg q4hprn Severe Pain, however this may be needed adjusted given her somnolence today. C/w Metaclopramide 5-10 mg po/IV q8hprn and Ondansetron 4 mg po/IV q6hprn Nausea.  -C/w Bowel Regimen. WBAT LLE and PT/OT recommending SNF. -She now has left knee pain the  orthopedic surgery team is ordering her x-ray which showed severe tricompartmental degenerative changes of the knee with no acute displaced fracture or dislocation. -PT OT recommending SNF   Alcohol intoxication (HCC) with Concern for Withdrawal: Blood alcohol level of 91 at time of admission.  er records and husband reports patient drinks approximately 6 alcoholic beverage per day. Continue CIWA protocol. C/w MVI+Minerals 1 tab po daily, Folic Acid 1 mg po Daily and Thiamine 100 mg po/IV. Cessation counseling provided.   Paroxysmal Atrial Fibrillation (HCC) w/ RVR: EKG shows atrial flutter at time of admission. TSH is now 0.482. Off of Apixaban for approximately 1 week Prior to Surgery but resumed 3/22 by Ortho @ 5 mg po BID. C/w Amiodarone 200 mg po at bedtime and c/w Metoprolol Succinate 25 mg po at bedtime. CTM on Telemetry as HR continue to be elevated   Chronic Diastolic CHF (congestive heart failure) (HCC): Stable and compensated. Follow daily weights, strict I's and O's and maintain adequate hydration. Given a 1 Liter bolus urdyrtfsu given HR. CTM for S/Sx of Volume Overload. Holding Furosemide 20 mg po Daily currently. She is -0.708 Liters since admit. Outpatient follow-up with cardiology service recommended.  HypoNa+: Na+ is now stable and improved to 138. CTM and Trend and repeat CMP in the AM  Hypokalemia: K+ is now 3.3. Replete with po Kcl 40 mEQ BID x2. CTM and repeat CMP int eh AM  Acute Urinary Retention: Was retaining urine so had to have a Foley catheter placed.  She subsequently pulled out overnight and was replaced.  Will need to continue to try to get her mobilizing out of bed and do a trial of void prior  to discharge.  Leukocytosis: WBC went from 8.0 -> 11.1 and is now improved to 7.9. Likely reactive in the setting of Surgery. CTM for S/Sx of Infection. Repeat CBC in the AM  Vitamin D Deficiency: Vitamin D Level was 20.33. Start Cholecalciferol 1,000 international units   Daily  Hyperbilirubinemia: Bilirubin is now trended up slightly and left 0.9 is now 1.3.  CTM and trend and repeat CMP in the AM.   Post-Operative ABLA: Hgb/Hct went from 11.6/35.3 -> 9.6/29.2 -> 9.3/28.3. Checked Anemia Panel and showed an iron level of 24, UIBC of 262, TIBC of 286, saturation ratio of 8%, ferritin level of 416, folate level of 16.9, vitamin B12 level of 339. CTM for S/Sx of Bleeding; No overt bleeding noted.  Repeat CBC in the a.m.  COPD: Currently not decompensated. CTM Respiratory carefully and will utilize bronchodilator management and the use of oxygen supplementation if necessary  Dementia: C/w Mirtazapine 7.5 mg po at bedtime. Delirium Precautions. Limit Narcotics if able  Primary Lung Cancer with Metastasis from Lung to Other Site: Not receiving active treatment currently.  Will need follow-up with primary medical oncology team @ [facility]   Hypoalbuminemia: Albumin went from 3.9 -> 3.2 -> 3.5. CTM and Trend and repeat CMP in the AM:   DVT prophylaxis: SCDs Start: 12/16/23 1435 SCDs Start: 12/15/23 2313 apixaban (ELIQUIS) tablet 5 mg    Code Status: Full Code Family Communication: No family currently at bedside today  Disposition Plan:  Level of care: Telemetry Medical Status is: Inpatient Remains inpatient appropriate because: Needs further clinical improvement and her heart rate and PT/OT recommending SNF   Consultants:  Orthopedic surgery  Procedures:  Procedures by Dr. Jena Gauss: CPT 27245-Cephalomedullary nailing of left intertrochanteric femur fracture CPT 20245-Deep bone biopsy    Antimicrobials:  Anti-infectives (From admission, onward)    Start     Dose/Rate Route Frequency Ordered Stop   12/16/23 2000  ceFAZolin (ANCEF) IVPB 2g/100 mL premix        2 g 200 mL/hr over 30 Minutes Intravenous Every 8 hours 12/16/23 1434 12/17/23 1414       Subjective: Examined at bedside and still was somewhat confused and a little bit drowsy.  Had  just received some medication.  No nausea or vomiting.  Not really able to work with therapy today.  No other concerns or close this time.  Objective: Vitals:   12/17/23 2027 12/17/23 2101 12/18/23 0144 12/18/23 0817  BP: (!) 143/82 (!) 143/82 (!) 139/53 124/80  Pulse: (!) 120 (!) 120 99 (!) 114  Resp:    18  Temp:   98.2 F (36.8 C) 99.5 F (37.5 C)  TempSrc:    Oral  SpO2:   96% 97%  Weight:      Height:        Intake/Output Summary (Last 24 hours) at 12/18/2023 1859 Last data filed at 12/18/2023 1300 Gross per 24 hour  Intake 378.11 ml  Output 1250 ml  Net -871.89 ml   Filed Weights   12/16/23 1053  Weight: 61.2 kg   Examination: Physical Exam:  Constitutional: Thin Caucasian elderly female who appears older than her stated age who appears comfortable today but pleasantly confused and a little lethargic Respiratory: Diminished to auscultation bilaterally with some coarse breath sounds, no wheezing, rales, rhonchi or crackles. Normal respiratory effort and patient is not tachypenic. No accessory muscle use.  Unlabored breathing Cardiovascular: RRR, no murmurs / rubs / gallops. S1 and S2 auscultated. No extremity  edema.  Abdomen: Soft, non-tender, non-distended. Bowel sounds positive.  GU: Deferred. Musculoskeletal: No clubbing / cyanosis of digits/nails. No joint deformity upper and lower extremities.  Skin: No rashes, lesions, ulcers on limited skin evaluation. No induration; Warm and dry.  Neurologic: CN 2-12 grossly intact with no focal deficits. Romberg sign cerebellar reflexes not assessed.  Psychiatric: Impaired judgment and insight. She is pleasantly confused today and not oriented  Data Reviewed: I have personally reviewed following labs and imaging studies  CBC: Recent Labs  Lab 12/15/23 1847 12/16/23 0554 12/17/23 0432 12/18/23 0543  WBC 8.3 8.0 11.1* 7.9  NEUTROABS 5.7  --   --  5.6  HGB 13.7 11.6* 9.6* 9.3*  HCT 41.0 35.3* 29.2* 28.3*  MCV 98.3 98.3  98.3 98.3  PLT 296 267 236 223   Basic Metabolic Panel: Recent Labs  Lab 12/15/23 1917 12/15/23 2107 12/16/23 0554 12/17/23 0432 12/18/23 0543  NA 132*  --  133* 133* 138  K 3.9  --  4.0 4.2 3.3*  CL 98  --  103 106 104  CO2 22  --  23 22 24   GLUCOSE 90  --  103* 146* 91  BUN 25*  --  20 13 9   CREATININE 0.63  --  0.55 0.68 0.61  CALCIUM 9.2  --  7.4* 8.5* 9.3  MG  --  1.9  --  1.8 1.8  PHOS  --  2.8  --  3.9 2.7   GFR: Estimated Creatinine Clearance: 63.4 mL/min (by C-G formula based on SCr of 0.61 mg/dL). Liver Function Tests: Recent Labs  Lab 12/15/23 1917 12/17/23 0432 12/18/23 0543  AST 37 29 36  ALT 27 19 18   ALKPHOS 90 59 58  BILITOT 0.5 0.9 1.3*  PROT 7.1 5.8* 6.3*  ALBUMIN 3.9 3.2* 3.5   No results for input(s): "LIPASE", "AMYLASE" in the last 168 hours. No results for input(s): "AMMONIA" in the last 168 hours. Coagulation Profile: Recent Labs  Lab 12/15/23 2107  INR 1.0   Cardiac Enzymes: No results for input(s): "CKTOTAL", "CKMB", "CKMBINDEX", "TROPONINI" in the last 168 hours. BNP (last 3 results) No results for input(s): "PROBNP" in the last 8760 hours. HbA1C: No results for input(s): "HGBA1C" in the last 72 hours. CBG: No results for input(s): "GLUCAP" in the last 168 hours. Lipid Profile: No results for input(s): "CHOL", "HDL", "LDLCALC", "TRIG", "CHOLHDL", "LDLDIRECT" in the last 72 hours. Thyroid Function Tests: Recent Labs    12/17/23 0432  TSH 0.482   Anemia Panel: Recent Labs    12/18/23 0543  VITAMINB12 339  FOLATE 16.9  FERRITIN 416*  TIBC 286  IRON 24*  RETICCTPCT 1.9   Sepsis Labs: No results for input(s): "PROCALCITON", "LATICACIDVEN" in the last 168 hours.  No results found for this or any previous visit (from the past 240 hours).   Radiology Studies: DG Knee Left Port Result Date: 12/17/2023 CLINICAL DATA:  242330 Left knee pain [id number] EXAM: PORTABLE LEFT KNEE - 1-2 VIEW COMPARISON:  X-ray left femur 12/16/2023  FINDINGS: No evidence of fracture, dislocation, or joint effusion. Severe tricompartmental degenerative changes of the knee. Intramedullary nail fixation of the distal femur partially visualized. Plate and screw fixation of the tibia metadiaphysis. Soft tissues are unremarkable. IMPRESSION: 1.  Severe tricompartmental degenerative changes of the knee. 2.  No acute displaced fracture or dislocation. Electronically Signed   By: Tish Frederickson M.D.   On: 12/17/2023 22:32   Scheduled Meds:  acetaminophen  650 mg Oral Q6H  Or   acetaminophen  650 mg Rectal Q6H   amiodarone  200 mg Oral QHS   apixaban  5 mg Oral BID   chlorhexidine  15 mL Mouth/Throat Once   Or   mouth rinse  15 mL Mouth Rinse Once   Chlorhexidine Gluconate Cloth  6 each Topical Daily   cholecalciferol  1,000 Units Oral Daily   docusate sodium  100 mg Oral BID   folic acid  1 mg Oral Daily   metoprolol succinate  25 mg Oral QHS   mirabegron ER  50 mg Oral Daily   mirtazapine  7.5 mg Oral QHS   multivitamin with minerals  1 tablet Oral Daily   potassium chloride  40 mEq Oral BID   thiamine  100 mg Oral Daily   Or   thiamine  100 mg Intravenous Daily   Continuous Infusions:   LOS: 3 days   Marguerita Merles, DO [facility] Available via Epic secure chat 7am-7pm After these hours, please refer to coverage provider listed on [email or web address] 12/18/2023, 6:59 PM

## 2023-12-18 NOTE — Plan of Care (Signed)
   Problem: Clinical Measurements: Goal: Will remain free from infection Outcome: Progressing Goal: Diagnostic test results will improve Outcome: Progressing Goal: Respiratory complications will improve Outcome: Progressing Goal: Cardiovascular complication will be avoided Outcome: Progressing   Problem: Activity: Goal: Risk for activity intolerance will decrease Outcome: Progressing   Problem: Nutrition: Goal: Adequate nutrition will be maintained Outcome: Progressing   Problem: Coping: Goal: Level of anxiety will decrease Outcome: Progressing   Problem: Elimination: Goal: Will not experience complications related to bowel motility Outcome: Progressing Goal: Will not experience complications related to urinary retention Outcome: Progressing   Problem: Pain Managment: Goal: General experience of comfort will improve and/or be controlled Outcome: Progressing   Problem: Safety: Goal: Ability to remain free from injury will improve Outcome: Progressing   Problem: Skin Integrity: Goal: Risk for impaired skin integrity will decrease Outcome: Progressing

## 2023-12-19 ENCOUNTER — Encounter (HOSPITAL_COMMUNITY): Payer: Self-pay | Admitting: Student

## 2023-12-19 DIAGNOSIS — F03918 Unspecified dementia, unspecified severity, with other behavioral disturbance: Secondary | ICD-10-CM | POA: Diagnosis not present

## 2023-12-19 DIAGNOSIS — D649 Anemia, unspecified: Secondary | ICD-10-CM | POA: Diagnosis not present

## 2023-12-19 DIAGNOSIS — S72002A Fracture of unspecified part of neck of left femur, initial encounter for closed fracture: Secondary | ICD-10-CM | POA: Diagnosis not present

## 2023-12-19 DIAGNOSIS — I5032 Chronic diastolic (congestive) heart failure: Secondary | ICD-10-CM | POA: Diagnosis not present

## 2023-12-19 DIAGNOSIS — I5030 Unspecified diastolic (congestive) heart failure: Secondary | ICD-10-CM

## 2023-12-19 DIAGNOSIS — F10920 Alcohol use, unspecified with intoxication, uncomplicated: Secondary | ICD-10-CM | POA: Diagnosis not present

## 2023-12-19 DIAGNOSIS — I480 Paroxysmal atrial fibrillation: Secondary | ICD-10-CM | POA: Diagnosis not present

## 2023-12-19 DIAGNOSIS — F1010 Alcohol abuse, uncomplicated: Secondary | ICD-10-CM | POA: Diagnosis not present

## 2023-12-19 LAB — SURGICAL PATHOLOGY

## 2023-12-19 LAB — CBC WITH DIFFERENTIAL/PLATELET
Abs Immature Granulocytes: 0.05 10*3/uL (ref 0.00–0.07)
Basophils Absolute: 0.1 10*3/uL (ref 0.0–0.1)
Basophils Relative: 1 %
Eosinophils Absolute: 0.2 10*3/uL (ref 0.0–0.5)
Eosinophils Relative: 2 %
HCT: 27 % — ABNORMAL LOW (ref 36.0–46.0)
Hemoglobin: 8.8 g/dL — ABNORMAL LOW (ref 12.0–15.0)
Immature Granulocytes: 1 %
Lymphocytes Relative: 16 %
Lymphs Abs: 1.4 10*3/uL (ref 0.7–4.0)
MCH: 32.7 pg (ref 26.0–34.0)
MCHC: 32.6 g/dL (ref 30.0–36.0)
MCV: 100.4 fL — ABNORMAL HIGH (ref 80.0–100.0)
Monocytes Absolute: 1 10*3/uL (ref 0.1–1.0)
Monocytes Relative: 11 %
Neutro Abs: 6.6 10*3/uL (ref 1.7–7.7)
Neutrophils Relative %: 69 %
Platelets: 253 10*3/uL (ref 150–400)
RBC: 2.69 MIL/uL — ABNORMAL LOW (ref 3.87–5.11)
RDW: 13.5 % (ref 11.5–15.5)
WBC: 9.2 10*3/uL (ref 4.0–10.5)
nRBC: 0 % (ref 0.0–0.2)

## 2023-12-19 LAB — COMPREHENSIVE METABOLIC PANEL
ALT: 15 U/L (ref 0–44)
AST: 25 U/L (ref 15–41)
Albumin: 2.9 g/dL — ABNORMAL LOW (ref 3.5–5.0)
Alkaline Phosphatase: 66 U/L (ref 38–126)
Anion gap: 7 (ref 5–15)
BUN: 18 mg/dL (ref 8–23)
CO2: 25 mmol/L (ref 22–32)
Calcium: 8.9 mg/dL (ref 8.9–10.3)
Chloride: 106 mmol/L (ref 98–111)
Creatinine, Ser: 0.8 mg/dL (ref 0.44–1.00)
GFR, Estimated: >60 mL/min (ref >60)
Glucose, Bld: 111 mg/dL — ABNORMAL HIGH (ref 70–99)
Potassium: 4.9 mmol/L (ref 3.5–5.1)
Sodium: 138 mmol/L (ref 135–145)
Total Bilirubin: 0.9 mg/dL (ref 0.0–1.2)
Total Protein: 5.8 g/dL — ABNORMAL LOW (ref 6.5–8.1)

## 2023-12-19 LAB — PHOSPHORUS: Phosphorus: 3.8 mg/dL (ref 2.5–4.6)

## 2023-12-19 LAB — MAGNESIUM: Magnesium: 1.9 mg/dL (ref 1.7–2.4)

## 2023-12-19 MED ORDER — METOPROLOL TARTRATE 25 MG PO TABS
25.0000 mg | ORAL_TABLET | Freq: Four times a day (QID) | ORAL | Status: DC
  Administered 2023-12-19 – 2023-12-20 (×3): 25 mg via ORAL
  Filled 2023-12-19 (×3): qty 1

## 2023-12-19 MED ORDER — METHOCARBAMOL 500 MG PO TABS: 500.0000 mg | ORAL_TABLET | Freq: Four times a day (QID) | ORAL | 0 refills | Status: DC | PRN

## 2023-12-19 MED ORDER — VITAMIN D 25 MCG (1000 UNIT) PO TABS
2000.0000 [IU] | ORAL_TABLET | Freq: Every day | ORAL | Status: DC
  Administered 2023-12-19 – 2023-12-25 (×7): 2000 [IU] via ORAL
  Filled 2023-12-19 (×7): qty 2

## 2023-12-19 MED ORDER — VITAMIN D3 25 MCG PO TABS: 2000.0000 [IU] | ORAL_TABLET | Freq: Every day | ORAL | 0 refills | Status: DC

## 2023-12-19 MED ORDER — OXYCODONE HCL 5 MG PO TABS: 5.0000 mg | ORAL_TABLET | ORAL | 0 refills | Status: DC | PRN

## 2023-12-19 MED ORDER — LORAZEPAM 2 MG/ML IJ SOLN
1.0000 mg | INTRAMUSCULAR | Status: AC | PRN
  Administered 2023-12-21: 2 mg via INTRAVENOUS
  Filled 2023-12-19: qty 1

## 2023-12-19 MED ORDER — LORAZEPAM 1 MG PO TABS
1.0000 mg | ORAL_TABLET | ORAL | Status: AC | PRN
  Administered 2023-12-19 – 2023-12-21 (×6): 1 mg via ORAL
  Filled 2023-12-19 (×6): qty 1

## 2023-12-19 MED ORDER — METOPROLOL SUCCINATE ER 50 MG PO TB24: 50.0000 mg | ORAL_TABLET | Freq: Every day | ORAL | Status: DC

## 2023-12-19 NOTE — Progress Notes (Signed)
 PROGRESS NOTE    Cynthia Kelley  ZOX:[id number] DOB: 08/13/1956 DOA: 12/15/2023 PCP: Anabel Halon, MD   Brief Narrative:  The patient is a 68 year old Caucasian female with past medical history significant for Mannam to alcohol abuse, atrial flutter, dementia, COPD, diastolic CHF and other comorbidities who presents with complaint of a fall.  Initially presented to [facility] and transferred to [facility] for further evaluation.  Patient drinks 6 alcoholic beverages daily and was intoxicated prior to coming in.  Further workup was done and she was found to have a left intratrochanteric femur fracture and orthopedic surgery was consulted and she is now status post cephalomedullary nailing.  Hospitalization has been complicated by postoperative acute blood loss anemia, alcohol intoxication with concern for withdrawal and A-fib with RVR.  She is also having some urinary retention so Foley catheter been placed.  PT OT recommending SNF and bed offers given and no offers and eating so she gets accepted [facility] and has a bed at [facility] so husband was able to evaluate this tomorrow.  Assessment and Plan:  Closed left intertrochanteric femur fracture (HCC) in the setting of Fall s/p Cephalomedullary Nailing: Patient intoxicated with alcohol on admit. Pelvic x-ray >  Left femoral intertrochanteric fracture with severe varus angulation.  -Head CT negative for acute abnormality.   -She has chronic right femur fracture , sustained from a fall, that has been present since 6/82023, per notes patient and spouse opted for nonoperative management.  -Now s/p surgical intervention and a deep bone biopsy and further care per orthopedic surgery. AC w/ Apixaban has been resumed.   -Pain control with Acetaminophen 650 mg po/RC q6hprn, Oxycodone IR 5-10 mg q4hprn Moderate Pain, and IV Hydromorphone 0.5-1 mg q4hprn Severe Pain, however this may be needed adjusted given her somnolence today. C/w Metaclopramide 5-10 mg  po/IV q8hprn and Ondansetron 4 mg po/IV q6hprn Nausea.  -C/w Bowel Regimen. WBAT LLE and PT/OT recommending SNF. -She now has left knee pain the orthopedic surgery team is ordering her x-ray which showed severe tricompartmental degenerative changes of the knee with no acute displaced fracture or dislocation. -PT OT recommending SNF   Alcohol intoxication (HCC) with Concern for Withdrawal: Blood alcohol level of 91 at time of admission.  er records and husband reports patient drinks approximately 6 alcoholic beverage per day. Continue CIWA protocol. C/w MVI+Minerals 1 tab po daily, Folic Acid 1 mg po Daily and Thiamine 100 mg po/IV. Cessation counseling provided.   Paroxysmal Atrial Fibrillation (HCC) w/ RVR: EKG shows atrial flutter at time of admission. TSH is now 0.482. Off of Apixaban for approximately 1 week Prior to Surgery but resumed 3/22 by Ortho @ 5 mg po BID. C/w Amiodarone 200 mg po at bedtime and Increase Metoprolol Succinate 50 mg po at bedtime. CTM on Telemetry as HR continue to be elevated. Will consult Cardiology for further evaluation now that HR's remain elevated    Chronic Diastolic CHF (congestive heart failure) (HCC): Stable and compensated. Follow daily weights, strict I's and O's and maintain adequate hydration. Given a 1 Liter bolus given HR. CTM for S/Sx of Volume Overload. Holding Furosemide 20 mg po Daily currently. She is -0.468 Liters since admit. Outpatient follow-up with Cardiology service recommended.  HypoNa+: Na+ is now stable and improved to 138. CTM and Trend and repeat CMP in the AM  Hypokalemia: K+ is now 4.0. CTM and repeat CMP int eh AM  Acute Urinary Retention: Was retaining urine so had to have a Foley  catheter placed.  She subsequently pulled out overnight and was replaced.  Will need to continue to try to get her mobilizing out of bed and do a trial of void prior to discharge.  Leukocytosis: WBC went from 8.0 -> 11.1 -> 7.9 -> 9.2. Likely reactive in the  setting of Surgery. CTM for S/Sx of Infection. Repeat CBC in the AM  Vitamin D Deficiency: Vitamin D Level was 20.33. Increased Cholecalciferol 2,000 international units daily and will continue for 90 days  Hyperbilirubinemia: Bilirubin is now back to 0.9. CTM and trend and repeat CMP in the AM.   Post-Operative ABLA: Hgb/Hct went from 11.6/35.3 -> 9.6/29.2 -> 9.3/28.3 -> 8.8/27.0. Checked Anemia Panel and showed an iron level of 24, UIBC of 262, TIBC of 286, saturation ratio of 8%, ferritin level of 416, folate level of 16.9, vitamin B12 level of 339. CTM for S/Sx of Bleeding; No overt bleeding noted.  Start Niferex supplementation given her anemia.  Repeat CBC in the a.m.  COPD: Currently not decompensated. CTM Respiratory carefully and will utilize bronchodilator management and the use of oxygen supplementation if necessary  Dementia: C/w Mirtazapine 7.5 mg po at bedtime. Delirium Precautions. Limit Narcotics if able  Primary Lung Cancer with Metastasis from Lung to Other Site: Not receiving active treatment currently.  Will need follow-up with primary medical oncology team @ [facility]   Hypoalbuminemia: Albumin went from 3.9 -> 3.2 -> 3.5 -> 2.9. CTM and Trend and repeat CMP in the AM:   DVT prophylaxis: SCDs Start: 12/16/23 1435 SCDs Start: 12/15/23 2313 apixaban (ELIQUIS) tablet 5 mg    Code Status: Full Code Family Communication: Discussed w/ patient's husband at bedside  Disposition Plan:  Level of care: Telemetry Medical Status is: Inpatient Remains inpatient appropriate because: Needs a little bit better control of her heart rate and PT OT to further evaluate and treat   Consultants:  Orthopedic Surgery Cardiology  Procedures:  As delineated as above  Antimicrobials:  Anti-infectives (From admission, onward)    Start     Dose/Rate Route Frequency Ordered Stop   12/16/23 2000  ceFAZolin (ANCEF) IVPB 2g/100 mL premix        2 g 200 mL/hr over 30 Minutes  Intravenous Every 8 hours 12/16/23 1434 12/17/23 1414       Subjective: Seen and examined at bedside and felt very weak and wanted to work with therapy more.  Continues to have some pain.  No nausea or vomiting.  Heart rate remains a little elevated.  No other concerns or complaints at this time and husband is evaluating SNF prior to making decision.  Objective: Vitals:   12/19/23 0100 12/19/23 0353 12/19/23 0848 12/19/23 1454  BP: 107/61 138/63 105/72 124/75  Pulse:   98 (!) 124  Resp:  16 18 20   Temp: 98.8 F (37.1 C) 98.6 F (37 C) 98.7 F (37.1 C) 98.1 F (36.7 C)  TempSrc: Oral  Oral Oral  SpO2:  97% 96% 93%  Weight:      Height:        Intake/Output Summary (Last 24 hours) at 12/19/2023 1708 Last data filed at 12/18/2023 1945 Gross per 24 hour  Intake 240 ml  Output --  Net 240 ml   Filed Weights   12/16/23 1053  Weight: 61.2 kg   Examination: Physical Exam:  Constitutional: Thin Caucasian female who appears a little anxious today Respiratory: Diminished to auscultation bilaterally, no wheezing, rales, rhonchi or crackles. Normal respiratory effort and  patient is not tachypenic. No accessory muscle use.  Cardiovascular: Irregularly irregular and tachycardic, no murmurs / rubs / gallops. S1 and S2 auscultated. No extremity edema.  Abdomen: Soft, non-tender, non-distended. Bowel sounds positive.  GU: Deferred.  Foley catheter is in place Musculoskeletal: No clubbing / cyanosis of digits/nails. No joint deformity upper and lower extremities. Skin: No rashes, lesions, ulcers on limited skin evaluation. No induration; Warm and dry.  Neurologic: CN 2-12 grossly intact with no focal deficits. Romberg sign and cerebellar reflexes not assessed.  Psychiatric: She is awake and alert and little anxious  Data Reviewed: I have personally reviewed following labs and imaging studies  CBC: Recent Labs  Lab 12/15/23 1847 12/16/23 0554 12/17/23 0432 12/18/23 0543  12/19/23 0822  WBC 8.3 8.0 11.1* 7.9 9.2  NEUTROABS 5.7  --   --  5.6 6.6  HGB 13.7 11.6* 9.6* 9.3* 8.8*  HCT 41.0 35.3* 29.2* 28.3* 27.0*  MCV 98.3 98.3 98.3 98.3 100.4*  PLT 296 267 236 223 253   Basic Metabolic Panel: Recent Labs  Lab 12/15/23 1917 12/15/23 2107 12/16/23 0554 12/17/23 0432 12/18/23 0543 12/19/23 0822  NA 132*  --  133* 133* 138 138  K 3.9  --  4.0 4.2 3.3* 4.9  CL 98  --  103 106 104 106  CO2 22  --  23 22 24 25   GLUCOSE 90  --  103* 146* 91 111*  BUN 25*  --  20 13 9 18   CREATININE 0.63  --  0.55 0.68 0.61 0.80  CALCIUM 9.2  --  7.4* 8.5* 9.3 8.9  MG  --  1.9  --  1.8 1.8 1.9  PHOS  --  2.8  --  3.9 2.7 3.8   GFR: Estimated Creatinine Clearance: 63.4 mL/min (by C-G formula based on SCr of 0.8 mg/dL). Liver Function Tests: Recent Labs  Lab 12/15/23 1917 12/17/23 0432 12/18/23 0543 12/19/23 0822  AST 37 29 36 25  ALT 27 19 18 15   ALKPHOS 90 59 58 66  BILITOT 0.5 0.9 1.3* 0.9  PROT 7.1 5.8* 6.3* 5.8*  ALBUMIN 3.9 3.2* 3.5 2.9*   No results for input(s): "LIPASE", "AMYLASE" in the last 168 hours. No results for input(s): "AMMONIA" in the last 168 hours. Coagulation Profile: Recent Labs  Lab 12/15/23 2107  INR 1.0   Cardiac Enzymes: No results for input(s): "CKTOTAL", "CKMB", "CKMBINDEX", "TROPONINI" in the last 168 hours. BNP (last 3 results) No results for input(s): "PROBNP" in the last 8760 hours. HbA1C: No results for input(s): "HGBA1C" in the last 72 hours. CBG: No results for input(s): "GLUCAP" in the last 168 hours. Lipid Profile: No results for input(s): "CHOL", "HDL", "LDLCALC", "TRIG", "CHOLHDL", "LDLDIRECT" in the last 72 hours. Thyroid Function Tests: Recent Labs    12/17/23 0432  TSH 0.482   Anemia Panel: Recent Labs    12/18/23 0543  VITAMINB12 339  FOLATE 16.9  FERRITIN 416*  TIBC 286  IRON 24*  RETICCTPCT 1.9   Sepsis Labs: No results for input(s): "PROCALCITON", "LATICACIDVEN" in the last 168  hours.  No results found for this or any previous visit (from the past 240 hours).   Radiology Studies: DG Knee Left Port Result Date: 12/17/2023 CLINICAL DATA:  242330 Left knee pain [id number] EXAM: PORTABLE LEFT KNEE - 1-2 VIEW COMPARISON:  X-ray left femur 12/16/2023 FINDINGS: No evidence of fracture, dislocation, or joint effusion. Severe tricompartmental degenerative changes of the knee. Intramedullary nail fixation of the distal femur partially  visualized. Plate and screw fixation of the tibia metadiaphysis. Soft tissues are unremarkable. IMPRESSION: 1.  Severe tricompartmental degenerative changes of the knee. 2.  No acute displaced fracture or dislocation. Electronically Signed   By: Tish Frederickson M.D.   On: 12/17/2023 22:32   Scheduled Meds:  acetaminophen  650 mg Oral Q6H   Or   acetaminophen  650 mg Rectal Q6H   amiodarone  200 mg Oral QHS   apixaban  5 mg Oral BID   chlorhexidine  15 mL Mouth/Throat Once   Or   mouth rinse  15 mL Mouth Rinse Once   Chlorhexidine Gluconate Cloth  6 each Topical Daily   cholecalciferol  2,000 Units Oral Daily   docusate sodium  100 mg Oral BID   folic acid  1 mg Oral Daily   iron polysaccharides  150 mg Oral Daily   metoprolol succinate  50 mg Oral QHS   mirabegron ER  50 mg Oral Daily   mirtazapine  7.5 mg Oral QHS   multivitamin with minerals  1 tablet Oral Daily   thiamine  100 mg Oral Daily   Continuous Infusions:   LOS: 4 days   Marguerita Merles, DO [facility] Available via Epic secure chat 7am-7pm After these hours, please refer to coverage provider listed on [email or web address] 12/19/2023, 5:08 PM

## 2023-12-19 NOTE — Plan of Care (Signed)

## 2023-12-19 NOTE — Progress Notes (Signed)
 Orthopaedic Trauma Progress Note  SUBJECTIVE: Doing ok this AM. Notes stiffness from being in bed.  Wants to get up and move around today.  Does not remember working with therapies over the weekend.  Denies any numbness or tingling throughout the left lower extremity.  Denies chest pain. No SOB. No nausea/vomiting. No other complaints.  Tolerating diet and fluids.  OBJECTIVE:  Vitals:   12/19/23 0353 12/19/23 0848  BP: 138/63 105/72  Pulse:  98  Resp: 16 18  Temp: 98.6 F (37 C) 98.7 F (37.1 C)  SpO2: 97% 96%    General: Sitting up in bed, no acute distress Respiratory: No increased work of breathing.  Left lower extremity: Dressings removed, incisions are clean, dry, intact with Dermabond in place.  Tenderness over the hip and throughout the thigh as expected.  Ankle DF/PF intact.  Endorses sensation all aspects of the foot.  No calf tenderness. + DP pulse  IMAGING: Stable post op imaging.   LABS:  Results for orders placed or performed during the hospital encounter of 12/15/23 (from the past 24 hours)  CBC with Differential/Platelet     Status: Abnormal   Collection Time: 12/19/23  8:22 AM  Result Value Ref Range   WBC 9.2 4.0 - 10.5 K/uL   RBC 2.69 (L) 3.87 - 5.11 MIL/uL   Hemoglobin 8.8 (L) 12.0 - 15.0 g/dL   HCT 78.2 (L) 95.6 - 21.3 %   MCV 100.4 (H) 80.0 - 100.0 fL   MCH 32.7 26.0 - 34.0 pg   MCHC 32.6 30.0 - 36.0 g/dL   RDW 08.6 57.8 - 46.9 %   Platelets 253 150 - 400 K/uL   nRBC 0.0 0.0 - 0.2 %   Neutrophils Relative % 69 %   Neutro Abs 6.6 1.7 - 7.7 K/uL   Lymphocytes Relative 16 %   Lymphs Abs 1.4 0.7 - 4.0 K/uL   Monocytes Relative 11 %   Monocytes Absolute 1.0 0.1 - 1.0 K/uL   Eosinophils Relative 2 %   Eosinophils Absolute 0.2 0.0 - 0.5 K/uL   Basophils Relative 1 %   Basophils Absolute 0.1 0.0 - 0.1 K/uL   Immature Granulocytes 1 %   Abs Immature Granulocytes 0.05 0.00 - 0.07 K/uL  Comprehensive metabolic panel     Status: Abnormal   Collection Time:  12/19/23  8:22 AM  Result Value Ref Range   Sodium 138 135 - 145 mmol/L   Potassium 4.9 3.5 - 5.1 mmol/L   Chloride 106 98 - 111 mmol/L   CO2 25 22 - 32 mmol/L   Glucose, Bld 111 (H) 70 - 99 mg/dL   BUN 18 8 - 23 mg/dL   Creatinine, Ser 6.29 0.44 - 1.00 mg/dL   Calcium 8.9 8.9 - 52.8 mg/dL   Total Protein 5.8 (L) 6.5 - 8.1 g/dL   Albumin 2.9 (L) 3.5 - 5.0 g/dL   AST 25 15 - 41 U/L   ALT 15 0 - 44 U/L   Alkaline Phosphatase 66 38 - 126 U/L   Total Bilirubin 0.9 0.0 - 1.2 mg/dL   GFR, Estimated >41 >32 mL/min   Anion gap 7 5 - 15  Magnesium     Status: None   Collection Time: 12/19/23  8:22 AM  Result Value Ref Range   Magnesium 1.9 1.7 - 2.4 mg/dL  Phosphorus     Status: None   Collection Time: 12/19/23  8:22 AM  Result Value Ref Range   Phosphorus 3.8 2.5 -  4.6 mg/dL    ASSESSMENT: Cynthia Kelley is a 68 y.o. female, 3 Days Post-Op s/p INTRAMEDULLARY NAIL LEFT INTERTROCHANTERIC FEMUR FRACTURE  CV/Blood loss: Acute blood loss anemia, Hgb 8.8 this morning. Hemodynamically stable  PLAN: Weightbearing: WBAT LLE ROM: Okay for unrestricted hip/knee motion Incisional and dressing care: Okay to leave incisions open to air Showering: Okay to begin getting incisions wet Orthopedic device(s): None  Pain management:  1. Tylenol 650 mg q 6 hours scheduled 2. Robaxin 500 mg q 6 hours PRN 3. Oxycodone 5-10 mg q 4 hours PRN 4. Dilaudid 0.5-1 mg q 4 hours PRN VTE prophylaxis:  Eliquis , SCDs ID:  Ancef 2gm post op completed Foley/Lines: Foley in place for urinary retention.  Will need voiding trial prior to discharge.  KVO IVFs Impediments to Fracture Healing: Vitamin D level 20, started on supplementation Dispo: PT/OT evaluation ongoing, recommending SNF.  TOC following for placement.  Okay for discharge from ortho standpoint once cleared by medicine team and therapies  D/C recommendations: -Oxycodone and Robaxin for pain control -Home dose Eliquis for DVT prophylaxis -Continue 2000  units Vit D supplementation daily x 90 days  Follow - up plan: 2 weeks after discharge for wound check and repeat x-rays   Contact information:  Truitt Merle MD, Thyra Breed PA-C. After hours and holidays please check [email or web address] for group call information for [facility]   Thompson Caul, PA-C [phone] (office) [email or web address]

## 2023-12-19 NOTE — Care Management Important Message (Signed)
 Important Message  Patient Details  Name: Cynthia Kelley MRN: [id number] Date of Birth: [birth year]   Important Message Given:  Yes - Medicare IM     Sherilyn Banker 12/19/2023, 12:41 PM

## 2023-12-19 NOTE — Progress Notes (Addendum)
 Transition of Care [facility]) - CAGE-AID Screening   Patient Details  Name: Cynthia Kelley MRN: [id number] Date of Birth: [birth year]  [facility]) CM/SW Contact:    Katha Hamming, RN Phone Number: 12/19/2023, 11:11 PM   Clinical Narrative:  Daily alcohol consumption - Offered resources, pt declined. Per RN, pt has been experiencing some withdrawal s/s today requiring ativan.  CAGE-AID Screening:    Have You Ever Felt You Ought to Cut Down on Your Drinking or Drug Use?: Yes Have People Annoyed You By Critizing Your Drinking Or Drug Use?: No Have You Felt Bad Or Guilty About Your Drinking Or Drug Use?: No Have You Ever Had a Drink or Used Drugs First Thing In The Morning to Steady Your Nerves or to Get Rid of a Hangover?: No CAGE-AID Score: 1  Substance Abuse Education Offered: Yes (declines resources)

## 2023-12-19 NOTE — Consult Note (Signed)
 Cardiology Consultation:  Patient ID: Cynthia Kelley MRN: [id number]; DOB: 1955/12/19  Admit date: 12/15/2023 Date of Consult: 12/19/2023  Primary Care Provider: Anabel Halon, MD Primary Cardiologist: None  Primary Electrophysiologist:  None   Patient Profile:  Cynthia Kelley is a 68 y.o. female with a hx of etoh abuse, COPD, HFpEF, persistent atrial flutter (on amiodarone at home), SCC of lung (remission) who is being seen today for the evaluation of Afib with RVR at the request of Marguerita Merles, DO.  History of Present Illness:  Ms. Cynthia Kelley was admitted on 12/15/2023 with mechanical fall and suffered a L femoral neck fracture. The patient was taken for surgical repair 12/16/2023. EKG on arrival shows atrial flutter vs course Afib. [facility] and is on [facility]one. Compliance is questionable in the setting of significant etoh abuse.  The patient reports no chest pain or trouble breathing.  She reports she was trying to eat some rest.  She is not aware of her heart racing.  Telemetry shows likely coarse atrial fibrillation with heart rates in the 90 to 110 bpm range.  I did review records from [facility] she has had multiple cardioversions she is on amiodarone.  Alcohol abuse has been an issue and compliance with medications have been questionable.  Labs are notable for normal kidney function with creatinine of 0.80.  Hemoglobin trending down at 8.8.  Values were around 11.6 at the time of surgery.  An echocardiogram was obtained last year at [facility] which showed normal LV function.  Past Medical History: Past Medical History:  Diagnosis Date   A-fib [facility])    Acute diastolic congestive heart failure, NYHA class 2 (HCC) 11/11/2020   Anemia 11/22/2017   Anxiety 11/19/2020   Arthritis    Asymptomatic PVD (peripheral vascular disease) (HCC) 12/14/2016   Suspected.  Hands hyperemic with decreased cap refill. ? History vasospasm   COPD with acute exacerbation (HCC) 01/14/2021    Encounter to establish care 11/19/2020   FRACTURE, TIBIAL PLATEAU 05/11/2010   Qualifier: Diagnosis of  By: Romeo Apple MD, [place]     Lung cancer, primary, with metastasis from lung to other site [facility]) 01/16/2018   Metastatic to the liver   Nuclear sclerotic cataract of both eyes 06/08/2018   Osteopenia 05/15/2013   Osteoporosis    osteopenia   Tobacco abuse 12/14/2016    Past Surgical History: Past Surgical History:  Procedure Laterality Date   FRACTURE SURGERY     torn ACL   INTRAMEDULLARY (IM) NAIL INTERTROCHANTERIC Left 12/16/2023   Procedure: FIXATION, FRACTURE, INTERTROCHANTERIC, WITH INTRAMEDULLARY ROD;  Surgeon: Roby Lofts, MD;  Location: [facility] OR;  Service: Orthopedics;  Laterality: Left;   KNEE ARTHROSCOPY     PORTACATH PLACEMENT Right 2019     Allergies:    No Known Allergies  Social History:   Social History   Socioeconomic History   Marital status: Married    Spouse name: Jaylanni Eltringham   Number of children: 1   Years of education: 15   Highest education level: Associate degree: occupational, Scientist, product/process development, or vocational program  Occupational History   Occupation: Research scientist (medical)    Comment: retired  Tobacco Use   Smoking status: Every Day    Current packs/day: 0.50    Average packs/day: 0.5 packs/day for 60.2 years (30.1 ttl pk-yrs)    Types: Cigarettes    Start date: 09/28/1963    Passive exposure: Current   Smokeless tobacco: Never   Tobacco comments:    Smoking  Cessation Offered. 1 ppd as of 12/08/23  Vaping Use   Vaping status: Never Used  Substance and Sexual Activity   Alcohol use: Yes    Alcohol/week: 20.0 standard drinks of alcohol    Types: 20 Cans of beer per week    Comment: 3-4 beers a day   Drug use: No   Sexual activity: Yes    Partners: Male    Birth control/protection: Post-menopausal  Other Topics Concern   Not on file  Social History Narrative   Retired Research scientist (medical)   Lives with husband [name]   Daughter Cynthia Kelley is in [place]   Likes to walk    Social Drivers of Health   Financial Resource Strain: Low Risk  (12/16/2022)   Overall Financial Resource Strain (CARDIA)    Difficulty of Paying Living Expenses: Not hard at all  Food Insecurity: No Food Insecurity (12/16/2023)   Hunger Vital Sign    Worried About Running Out of Food in the Last Year: Never true    Ran Out of Food in the Last Year: Never true  Transportation Needs: No Transportation Needs (12/16/2023)   PRAPARE - Administrator, Civil Service (Medical): No    Lack of Transportation (Non-Medical): No  Physical Activity: Inactive (12/16/2022)   Exercise Vital Sign    Days of Exercise per Week: 0 days    Minutes of Exercise per Session: 0 min  Stress: No Stress Concern Present (12/16/2022)   [employer] of Occupational Health - Occupational Stress Questionnaire    Feeling of Stress : Only a little  Social Connections: Moderately Integrated (12/16/2023)   Social Connection and Isolation Panel [NHANES]    Frequency of Communication with Friends and Family: More than three times a week    Frequency of Social Gatherings with Friends and Family: More than three times a week    Attends Religious Services: 1 to 4 times per year    Active Member of [employer] or Organizations: No    Attends Banker Meetings: Never    Marital Status: Married  Catering manager Violence: Not At Risk (12/16/2023)   Humiliation, Afraid, Rape, and Kick questionnaire    Fear of Current or Ex-Partner: No    Emotionally Abused: No    Physically Abused: No    Sexually Abused: No     Family History:    Family History  Problem Relation Age of Onset   Cancer Father        died in 23's everywhere   Heart disease Mother    Cancer Mother        lung   Stroke Mother    Asthma Daughter      ROS:  All other ROS reviewed and negative. Pertinent positives noted in the HPI.     Physical Exam/Data:   Vitals:   12/19/23 0100 12/19/23 0353 12/19/23 0848 12/19/23 1454  BP:  107/61 138/63 105/72 124/75  Pulse:   98 (!) 124  Resp:  16 18 20   Temp: 98.8 F (37.1 C) 98.6 F (37 C) 98.7 F (37.1 C) 98.1 F (36.7 C)  TempSrc: Oral  Oral Oral  SpO2:  97% 96% 93%  Weight:      Height:        Intake/Output Summary (Last 24 hours) at 12/19/2023 1702 Last data filed at 12/18/2023 1945 Gross per 24 hour  Intake 240 ml  Output --  Net 240 ml       12/16/2023   10:53 AM  12/08/2023    8:07 AM 05/29/2023   11:12 AM  Last 3 Weights  Weight (lbs) 135 lb 119 lb 118 lb  Weight (kg) 61.236 kg 53.978 kg 53.524 kg    Body mass index is 21.46 kg/m.  General: Well nourished, well developed, in no acute distress Head: Atraumatic, normal size  Eyes: PEERLA, EOMI  Neck: Supple, no JVD Endocrine: No thryomegaly Cardiac: Normal S1, S2; irregular rhythm, no murmurs Lungs: Clear to auscultation bilaterally, no wheezing, rhonchi or rales  Abd: Soft, nontender, no hepatomegaly  Ext: Left leg with surgical dressing Musculoskeletal: No deformities, BUE and BLE strength normal and equal Skin: Warm and dry, no rashes   Neuro: Alert and oriented to person, place, time, and situation, CNII-XII grossly intact, no focal deficits  Psych: Normal mood and affect   EKG:  The EKG was personally reviewed and demonstrates: Atrial flutter versus coarse A-fib heart rate 78 Telemetry:  Telemetry was personally reviewed and demonstrates: A-fib 90 to 110 bpm  Relevant CV Studies: TTE 03/01/2023 ([facility]) SUMMARY  Recommend follow up transthoracic echocardiogram in every 1-2 years for routine surveillance of  moderate valvular heart disease. Change in cardiac exam or clinical status may warrant earlier  reassessment. 2020 ACC-AHA Guideline  Circ. vol 143, e72-e227.  Improved RVSP from prior study.  Normal LV size, wall thickness, wall motion and systolic function with ejection fraction 65-70%  Left ventricular diastolic function and atrial pressure are indeterminate due to  mitral annular   calcification  The right ventricle is normal in size and function.  The left atrium is severely dilated.  The right atrium is moderately dilated.  There is probably mild mitral stenosis due to heavy mitral annualr calcification. There is moderate  mitral regurgitation.  There is mild to moderate tricuspid regurgitation.  Mild pulmonary hypertension. Estimated right ventricular systolic pressure is 39 mmHg.  IVC size was normal. Right atrial pressure is estimated to be 8 mm Hg.  The aortic sinus is normal size.   Assessment and Plan:   # Paroxysmal atrial fibrillation/flutter -No history of this.  Follow[facility].  Now here with left hip fracture status post surgical intervention. -The patient was noted to be intoxicated and has a significant history of alcohol abuse. -For now would recommend rate control strategy.  I have stopped her amiodarone.  Her anticoagulation has been held.  She would be higher stroke risk if she did convert. -Best course of action is rate control.  Echo reviewed from the [facility] system last year that was normal.  She is not at heart failure. -Change metoprolol to metoprolol to tartrate 25 mg every 6 hours.  Titrate up for better rate control.  Continue Eliquis.  Should be cautious with anemia.  Continue for now. -Overall I favor a rate control strategy with consideration for outpatient cardioversion.  Will have her in the setting of ongoing alcohol abuse and medication noncompliance, a rate control strategy may be the best option.  I can defer this to her outpatient cardiologist.  # Left hip fracture status post repair # Anemia -Close monitoring of anemia.  On Eliquis.  # Alcohol abuse -Likely an issue in the setting of ongoing A-fib.  Close monitoring for withdrawal here.  # HFpEF -Euvolemic.  Agree with holding Lasix.      For questions or updates, please contact [facility] Please consult [email or web address] for contact info under     Signed, [facility]ri Spore T. Flora Lipps, MD, [facility]  [facility]  12/19/2023 5:02 PM

## 2023-12-19 NOTE — Progress Notes (Addendum)
       Overnight   NAME: Cynthia Kelley MRN: [id number] DOB : 04-01-1956  (Request from Nursing staff to remote coverage)  Date of Service   12/19/2023   HPI/Events of Note    Notified by RN for signs and symptoms of withdrawal (DTs) Noted to be agitated, screaming in room, resisting staff/care. CIWA at time of notification = 9  Brief history 68 year old female with past medical history of EtOH abuse, atrial flutter, dementia, COPD, diastolic CHF. Admitted through ER for fall  Orthopedic consult is active Cardiology consult is active  Patient is reported as drinking 6 alcoholic beverages per day, unstated type or volume. EtOH tox screen of 12/15/2023 @ 1847 hrs. shows 91 mg/dL. Hospital tracking shows now day 4 of admission.   Interventions/ Plan   CIWA medications and scale ordered Fall precautions Delirium precautions      Chinita Greenland BSN MSNA MSN ACNPC-AG Acute Care Nurse Practitioner [facility]

## 2023-12-19 NOTE — Progress Notes (Signed)
 Physical Therapy Treatment Patient Details Name: Cynthia Kelley MRN: [id number] DOB: 06-06-1956 Today's Date: 12/19/2023   History of Present Illness 68 y.o. female presented to the ED 3/21 with complaints of a fall on the tile floor in her bathroom today. Pelvic x-ray shows left femoral intertrochanteric fracture with severe varus angulation. Pt is now s/p IM nail on 3/21. PMH alcohol abuse, atrial flutter, dementia with behavorial disturbance, COPD, diastolic CHF, Lung CA with mets to lung    PT Comments  Patient agreeable and eager to participate with therapy, reportedly wants to mobilize. Overall, the patient is progressing well and demonstrating improvement since her initial evaluation. Patient is more alert and followed commands more consistently today. Patient requires MinA to ensure safety for bed mobility, sit > stand transfer, and when ambulating 74ft with a RW in her room. Gait training was limited due to increased pain localized in her LLE and extreme L knee valgus in stance and during swing phase. Patient will benefit from continued inpatient follow up therapy, <3 hours/day. Acute PT will continue to follow up to maximize her functional level of independence.    If plan is discharge home, recommend the following: Two people to help with walking and/or transfers;A lot of help with bathing/dressing/bathroom;Assistance with cooking/housework;Direct supervision/assist for medications management;Assist for transportation;Help with stairs or ramp for entrance;Supervision due to cognitive status   Can travel by private vehicle     No  Equipment Recommendations  Rolling walker (2 wheels);Wheelchair (measurements PT);Wheelchair cushion (measurements PT);Hospital bed    Recommendations for Other Services       Precautions / Restrictions Precautions Precautions: Fall Recall of Precautions/Restrictions: Impaired Restrictions Weight Bearing Restrictions Per Provider Order: Yes LLE Weight  Bearing Per Provider Order: Weight bearing as tolerated     Mobility  Bed Mobility Overal bed mobility: Needs Assistance Bed Mobility: Supine to Sit     Supine to sit: Min assist, HOB elevated     General bed mobility comments: MinA to elevate trunk and move legs EOB. Pt able to most of transfer, needed assistance finishing transfer    Transfers Overall transfer level: Needs assistance Equipment used: Rolling walker (2 wheels) Transfers: Sit to/from Stand Sit to Stand: Min assist           General transfer comment: Pt able to stand from elevated bed surface. Reports an increase in pain when distributing weight through LLE. Observed knee valgus throughout transfer    Ambulation/Gait Ambulation/Gait assistance: Min assist Gait Distance (Feet): 10 Feet Assistive device: Rolling walker (2 wheels) Gait Pattern/deviations: Step-to pattern, Decreased step length - left, Decreased stance time - left, Decreased stride length, Decreased weight shift to left, Knee flexed in stance - left, Trunk flexed Gait velocity: Decreased         Stairs             Wheelchair Mobility     Tilt Bed    Modified Rankin (Stroke Patients Only)       Balance Overall balance assessment: Needs assistance Sitting-balance support: Bilateral upper extremity supported, Feet supported Sitting balance-Leahy Scale: Fair     Standing balance support: Bilateral upper extremity supported, During functional activity, Reliant on assistive device for balance Standing balance-Leahy Scale: Poor Standing balance comment: Reliant on external support                            Communication Communication Communication: No apparent difficulties  Cognition Arousal: Alert Behavior During Therapy:  WFL for tasks assessed/performed   PT - Cognitive impairments: History of cognitive impairments                       PT - Cognition Comments: Hx of dementia and husband reports  that she is likely having ETOH withdrawal. Following commands: Impaired Following commands impaired: Follows one step commands with increased time, Only follows one step commands consistently    Cueing Cueing Techniques: Verbal cues, Gestural cues, Visual cues  Exercises      General Comments General comments (skin integrity, edema, etc.): Tachycardiac when mobiliizing  (HR up to 150bpm)      Pertinent Vitals/Pain Pain Assessment Pain Assessment: Faces Faces Pain Scale: Hurts even more Pain Location: L hip Pain Descriptors / Indicators: Discomfort, Grimacing Pain Intervention(s): Limited activity within patient's tolerance, Monitored during session, Repositioned, Ice applied    Home Living                          Prior Function            PT Goals (current goals can now be found in the care plan section) Acute Rehab PT Goals Patient Stated Goal: to get better PT Goal Formulation: With patient/family Time For Goal Achievement: 12/31/23 Potential to Achieve Goals: Fair Progress towards PT goals: Progressing toward goals    Frequency    Min 2X/week      PT Plan      Co-evaluation              AM-PAC PT "6 Clicks" Mobility   Outcome Measure  Help needed turning from your back to your side while in a flat bed without using bedrails?: A Little Help needed moving from lying on your back to sitting on the side of a flat bed without using bedrails?: A Little Help needed moving to and from a bed to a chair (including a wheelchair)?: A Little Help needed standing up from a chair using your arms (e.g., wheelchair or bedside chair)?: A Little Help needed to walk in hospital room?: A Little Help needed climbing 3-5 steps with a railing? : A Lot 6 Click Score: 17    End of Session Equipment Utilized During Treatment: Gait belt Activity Tolerance: Patient limited by pain Patient left: in chair;with call bell/phone within reach;with chair alarm set Nurse  Communication: Mobility status;Other (comment) (Tachycardic when Mobilizing) PT Visit Diagnosis: Other abnormalities of gait and mobility (R26.89)     Time: 4098-1191 PT Time Calculation (min) (ACUTE ONLY): 29 min  Charges:    $Therapeutic Activity: 23-37 mins PT General Charges $$ ACUTE PT VISIT: 1 Visit                     Doreen Beam, SPT   Jireh Elmore 12/19/2023, 3:06 PM

## 2023-12-19 NOTE — TOC Progression Note (Addendum)
 Transition of Care [facility]) - Progression Note    Patient Details  Name: Cynthia Kelley MRN: [id number] Date of Birth: [birth year]  Transition of Care [facility]) CM/SW Contact  Lorri Frederick, LCSW Phone Number: 12/19/2023, 12:09 PM  Clinical Narrative:   CSW spoke with pt husband [name] by phone, his preference would be [facility], would be open to options in [place] as second choice.  CSW sent referral to those facilities.    1550: No bed offers at [facility] or at [facility], [facility] or [facility].  CSW spoke with husband, discussed [facility] bed offers, he is interested in [facility].  CSW confirmed with [facility] that they do have available female bed.  Husband wants to stop by tomorrow to visit before committing.      Expected Discharge Plan: Skilled Nursing Facility Barriers to Discharge: Continued Medical Work up  Expected Discharge Plan and Services In-house Referral: Clinical Social Work     Living arrangements for the past 2 months: Single Family Home                                       Social Determinants of Health (SDOH) Interventions SDOH Screenings   Food Insecurity: No Food Insecurity (12/16/2023)  Housing: Low Risk  (12/16/2023)  Transportation Needs: No Transportation Needs (12/16/2023)  Utilities: Not At Risk (12/16/2023)  Alcohol Screen: Low Risk  (12/16/2022)  Depression (PHQ2-9): Low Risk  (12/08/2023)  Financial Resource Strain: Low Risk  (12/16/2022)  Physical Activity: Inactive (12/16/2022)  Social Connections: Moderately Integrated (12/16/2023)  Stress: No Stress Concern Present (12/16/2022)  Tobacco Use: High Risk (12/16/2023)    Readmission Risk Interventions    03/01/2022   11:11 AM  Readmission Risk Prevention Plan  Transportation Screening Complete  HRI or Home Care Consult Complete  Social Work Consult for Recovery Care Planning/Counseling Complete  Palliative Care Screening --  Medication Review Oceanographer) Complete

## 2023-12-19 NOTE — Discharge Instructions (Signed)
 Orthopaedic Trauma Service Discharge Instructions   General Discharge Instructions  WEIGHT BEARING STATUS:Weightbearing as tolerated  RANGE OF MOTION/ACTIVITY: ok for unrestricted motion as tolerated  Wound Care: You may remove your surgical dressings. Incisions can be left open to air if there is no drainage. Once the incision is completely dry and without drainage, it may be left open to air out.  Showering may begin post op day 3 (Monday 12/19/23).  Clean incision gently with soap and water.  DVT/PE prophylaxis: Home dose Eliquis  Diet: as you were eating previously.  Can use over the counter stool softeners and bowel preparations, such as Miralax, to help with bowel movements.  Narcotics can be constipating.  Be sure to drink plenty of fluids  PAIN MEDICATION USE AND EXPECTATIONS  You have likely been given narcotic medications to help control your pain.  After a traumatic event that results in an fracture (broken bone) with or without surgery, it is ok to use narcotic pain medications to help control one's pain.  We understand that everyone responds to pain differently and each individual patient will be evaluated on a regular basis for the continued need for narcotic medications. Ideally, narcotic medication use should last no more than 6-8 weeks (coinciding with fracture healing).   As a patient it is your responsibility as well to monitor narcotic medication use and report the amount and frequency you use these medications when you come to your office visit.   We would also advise that if you are using narcotic medications, you should take a dose prior to therapy to maximize you participation.  IF YOU ARE ON NARCOTIC MEDICATIONS IT IS NOT PERMISSIBLE TO OPERATE A MOTOR VEHICLE (MOTORCYCLE/CAR/TRUCK/MOPED) OR HEAVY MACHINERY DO NOT MIX NARCOTICS WITH OTHER CNS (CENTRAL NERVOUS SYSTEM) DEPRESSANTS SUCH AS ALCOHOL   STOP SMOKING OR USING NICOTINE PRODUCTS!!!!  As discussed nicotine  severely impairs your body's ability to heal surgical and traumatic wounds but also impairs bone healing.  Wounds and bone heal by forming microscopic blood vessels (angiogenesis) and nicotine is a vasoconstrictor (essentially, shrinks blood vessels).  Therefore, if vasoconstriction occurs to these microscopic blood vessels they essentially disappear and are unable to deliver necessary nutrients to the healing tissue.  This is one modifiable factor that you can do to dramatically increase your chances of healing your injury.    (This means no smoking, no nicotine gum, patches, etc)  DO NOT USE NONSTEROIDAL ANTI-INFLAMMATORY DRUGS (NSAID'S)  Using products such as Advil (ibuprofen), Aleve (naproxen), Motrin (ibuprofen) for additional pain control during fracture healing can delay and/or prevent the healing response.  If you would like to take over the counter (OTC) medication, Tylenol (acetaminophen) is ok.  However, some narcotic medications that are given for pain control contain acetaminophen as well. Therefore, you should not exceed more than 4000 mg of tylenol in a day if you do not have liver disease.  Also note that there are may OTC medicines, such as cold medicines and allergy medicines that my contain tylenol as well.  If you have any questions about medications and/or interactions please ask your doctor/PA or your pharmacist.      ICE AND ELEVATE INJURED/OPERATIVE EXTREMITY  Using ice and elevating the injured extremity above your heart can help with swelling and pain control.  Icing in a pulsatile fashion, such as 20 minutes on and 20 minutes off, can be followed.    Do not place ice directly on skin. Make sure there is a barrier  between to skin and the ice pack.    Using frozen items such as frozen peas works well as the conform nicely to the are that needs to be iced.  USE AN ACE WRAP OR TED HOSE FOR SWELLING CONTROL  In addition to icing and elevation, Ace wraps or TED hose are used to  help limit and resolve swelling.  It is recommended to use Ace wraps or TED hose until you are informed to stop.    When using Ace Wraps start the wrapping distally (farthest away from the body) and wrap proximally (closer to the body)   Example: If you had surgery on your leg or thing and you do not have a splint on, start the ace wrap at the toes and work your way up to the thigh        If you had surgery on your upper extremity and do not have a splint on, start the ace wrap at your fingers and work your way up to the upper arm  CALL THE OFFICE WITH ANY QUESTIONS OR CONCERNS: [phone]   VISIT OUR WEBSITE FOR ADDITIONAL INFORMATION: [email or web address]   Discharge Wound Care Instructions  Do NOT apply any ointments, solutions or lotions to pin sites or surgical wounds.  These prevent needed drainage and even though solutions like hydrogen peroxide kill bacteria, they also damage cells lining the pin sites that help fight infection.  Applying lotions or ointments can keep the wounds moist and can cause them to breakdown and open up as well. This can increase the risk for infection. When in doubt call the office.  If any drainage is noted, use one layer of adaptic or Mepitel, then gauze, Kerlix, and an ace wrap. - These dressing supplies should be available at local medical supply stores [facility], [facility], etc) as well as Insurance claims handler ([employer], [facility], [place], etc)  Once the incision is completely dry and without drainage, it may be left open to air out.  Showering may begin 36-48 hours later.  Cleaning gently with soap and water.

## 2023-12-20 DIAGNOSIS — F1010 Alcohol abuse, uncomplicated: Secondary | ICD-10-CM | POA: Diagnosis not present

## 2023-12-20 DIAGNOSIS — D649 Anemia, unspecified: Secondary | ICD-10-CM | POA: Diagnosis not present

## 2023-12-20 DIAGNOSIS — I480 Paroxysmal atrial fibrillation: Secondary | ICD-10-CM | POA: Diagnosis not present

## 2023-12-20 DIAGNOSIS — I5030 Unspecified diastolic (congestive) heart failure: Secondary | ICD-10-CM | POA: Diagnosis not present

## 2023-12-20 DIAGNOSIS — I5032 Chronic diastolic (congestive) heart failure: Secondary | ICD-10-CM | POA: Diagnosis not present

## 2023-12-20 DIAGNOSIS — S72002A Fracture of unspecified part of neck of left femur, initial encounter for closed fracture: Secondary | ICD-10-CM | POA: Diagnosis not present

## 2023-12-20 DIAGNOSIS — F03918 Unspecified dementia, unspecified severity, with other behavioral disturbance: Secondary | ICD-10-CM | POA: Diagnosis not present

## 2023-12-20 DIAGNOSIS — F10920 Alcohol use, unspecified with intoxication, uncomplicated: Secondary | ICD-10-CM | POA: Diagnosis not present

## 2023-12-20 DIAGNOSIS — I952 Hypotension due to drugs: Secondary | ICD-10-CM

## 2023-12-20 LAB — COMPREHENSIVE METABOLIC PANEL
ALT: 15 U/L (ref 0–44)
AST: 25 U/L (ref 15–41)
Albumin: 2.9 g/dL — ABNORMAL LOW (ref 3.5–5.0)
Alkaline Phosphatase: 76 U/L (ref 38–126)
Anion gap: 4 — ABNORMAL LOW (ref 5–15)
BUN: 22 mg/dL (ref 8–23)
CO2: 27 mmol/L (ref 22–32)
Calcium: 8.5 mg/dL — ABNORMAL LOW (ref 8.9–10.3)
Chloride: 106 mmol/L (ref 98–111)
Creatinine, Ser: 0.88 mg/dL (ref 0.44–1.00)
GFR, Estimated: >60 mL/min (ref >60)
Glucose, Bld: 103 mg/dL — ABNORMAL HIGH (ref 70–99)
Potassium: 4.4 mmol/L (ref 3.5–5.1)
Sodium: 137 mmol/L (ref 135–145)
Total Bilirubin: 1 mg/dL (ref 0.0–1.2)
Total Protein: 6 g/dL — ABNORMAL LOW (ref 6.5–8.1)

## 2023-12-20 LAB — CBC WITH DIFFERENTIAL/PLATELET
Abs Immature Granulocytes: 0.07 10*3/uL (ref 0.00–0.07)
Basophils Absolute: 0 10*3/uL (ref 0.0–0.1)
Basophils Relative: 0 %
Eosinophils Absolute: 0.3 10*3/uL (ref 0.0–0.5)
Eosinophils Relative: 3 %
HCT: 26.5 % — ABNORMAL LOW (ref 36.0–46.0)
Hemoglobin: 8.7 g/dL — ABNORMAL LOW (ref 12.0–15.0)
Immature Granulocytes: 1 %
Lymphocytes Relative: 13 %
Lymphs Abs: 1.2 10*3/uL (ref 0.7–4.0)
MCH: 32.8 pg (ref 26.0–34.0)
MCHC: 32.8 g/dL (ref 30.0–36.0)
MCV: 100 fL (ref 80.0–100.0)
Monocytes Absolute: 1 10*3/uL (ref 0.1–1.0)
Monocytes Relative: 10 %
Neutro Abs: 7.1 10*3/uL (ref 1.7–7.7)
Neutrophils Relative %: 73 %
Platelets: 265 10*3/uL (ref 150–400)
RBC: 2.65 MIL/uL — ABNORMAL LOW (ref 3.87–5.11)
RDW: 13.3 % (ref 11.5–15.5)
WBC: 9.7 10*3/uL (ref 4.0–10.5)
nRBC: 0 % (ref 0.0–0.2)

## 2023-12-20 LAB — MAGNESIUM: Magnesium: 1.8 mg/dL (ref 1.7–2.4)

## 2023-12-20 LAB — PHOSPHORUS: Phosphorus: 4.5 mg/dL (ref 2.5–4.6)

## 2023-12-20 MED ORDER — METOPROLOL TARTRATE 25 MG PO TABS
25.0000 mg | ORAL_TABLET | Freq: Two times a day (BID) | ORAL | Status: DC
  Administered 2023-12-21: 25 mg via ORAL
  Filled 2023-12-20: qty 1

## 2023-12-20 MED ORDER — MAGNESIUM SULFATE 2 GM/50ML IV SOLN
2.0000 g | Freq: Once | INTRAVENOUS | Status: AC
  Administered 2023-12-20: 2 g via INTRAVENOUS
  Filled 2023-12-20: qty 50

## 2023-12-20 MED ORDER — METOPROLOL TARTRATE 50 MG PO TABS: 100.0000 mg | ORAL_TABLET | Freq: Two times a day (BID) | ORAL | Status: DC

## 2023-12-20 MED ORDER — METOPROLOL TARTRATE 50 MG PO TABS: 50.0000 mg | ORAL_TABLET | Freq: Two times a day (BID) | ORAL | Status: DC

## 2023-12-20 MED ORDER — SODIUM CHLORIDE 0.9 % IV BOLUS
500.0000 mL | Freq: Once | INTRAVENOUS | Status: AC
  Administered 2023-12-20: 500 mL via INTRAVENOUS

## 2023-12-20 MED ORDER — METOPROLOL TARTRATE 25 MG PO TABS
25.0000 mg | ORAL_TABLET | Freq: Once | ORAL | Status: AC
  Administered 2023-12-20: 25 mg via ORAL
  Filled 2023-12-20: qty 1

## 2023-12-20 NOTE — Plan of Care (Signed)

## 2023-12-20 NOTE — Progress Notes (Signed)
 Made several calls to family due to patient requesting to speak with them. I told her the time of day of 0400 she was insisting that I call her family. Left messages at both contact numbers on the chart to call me when receive the message

## 2023-12-20 NOTE — Progress Notes (Signed)
 Pt unable to void after 6 hours of foley catheter removal. Bladder scan completed. 110 ml noted. Reported to night nurse to monitor.

## 2023-12-20 NOTE — Progress Notes (Signed)
 Physical Therapy Treatment Patient Details Name: Cynthia Kelley MRN: [id number] DOB: [birth year] Today's Date: 12/20/2023   History of Present Illness 68 y.o. female presented to the ED 3/21 with complaints of a fall on the tile floor in her bathroom today. Pelvic x-ray shows left femoral intertrochanteric fracture with severe varus angulation. Pt is now s/p IM nail on 3/21. PMH alcohol abuse, atrial flutter, dementia with behavorial disturbance, COPD, diastolic CHF, Lung CA with mets to lung, L knee surgeries.    PT Comments  Pt received in supine, lethargic and spouse present in her room, encouraging her. Pt needing up to totalA +2 for posterior supine scooting toward HOB (decreased bil shoulder flexion ROM) and up to modA for rolling to L/R sides due to lethargy and minA to achieve long sitting in bed using rails. Defer standing trials due to pt soft BP in supine/seated postures and lethargy. Multimodal cues to initiate and perform all motor tasks today and pt with minimal to no c/o pain with AAROM on LLE and repositioning. Repositioned for pressure relief/comfort in bed chair posture and blind fully opened to promote better sleep/wake cycle. Patient will benefit from continued inpatient follow up therapy, <3 hours/day.  BP (!) 82/62  BP Location Right Arm  BP Method Automatic  Patient Position (if appropriate) Lying (HOB zero degrees)   Pulse Rate 92  Pulse Rate Source Dinamap  BP (!) 88/54  BP Location Right Arm  BP Method Automatic  Patient Position (if appropriate) Sitting (HOB 64 deg, bed in chair posture with feet down)     If plan is discharge home, recommend the following: Two people to help with walking and/or transfers;A lot of help with bathing/dressing/bathroom;Assistance with cooking/housework;Direct supervision/assist for medications management;Assist for transportation;Help with stairs or ramp for entrance;Supervision due to cognitive status   Can travel by private vehicle      No  Equipment Recommendations  Rolling walker (2 wheels);Wheelchair (measurements PT);Wheelchair cushion (measurements PT);Hospital bed    Recommendations for Other Services       Precautions / Restrictions Precautions Precautions: Fall Recall of Precautions/Restrictions: Impaired Precaution/Restrictions Comments: Watch HR Restrictions Weight Bearing Restrictions Per Provider Order: Yes LLE Weight Bearing Per Provider Order: Weight bearing as tolerated     Mobility  Bed Mobility Overal bed mobility: Needs Assistance Bed Mobility: Supine to Sit, Rolling Rolling: Min assist, Mod assist, Used rails   Supine to sit: Min assist, HOB elevated, Used rails     General bed mobility comments: Pt using bil bed rails when cued to elevate trunk into long sitting posture; defer EOB due to pt lethargy and very soft BP in supine with no improvement while pt sitting ~70 deg HOB (using HOB elevated and pillows)    Transfers Overall transfer level: Needs assistance                 General transfer comment: Defer due to pt lethargy/soft BP in seated posture.    Ambulation/Gait               General Gait Details: defer, pt too lethargic today.   Stairs             Wheelchair Mobility     Tilt Bed    Modified Rankin (Stroke Patients Only)       Balance Overall balance assessment: Needs assistance Sitting-balance support: Bilateral upper extremity supported, Feet unsupported Sitting balance-Leahy Scale: Poor Sitting balance - Comments: long sitting in bed, likely due to pelvic position/mattress, did not assess  EOB today as pt too lethargic       Standing balance comment: defer; lethargy                            Communication Communication Communication: No apparent difficulties  Cognition Arousal: Lethargic Behavior During Therapy: Flat affect   PT - Cognitive impairments: History of cognitive impairments, Orientation, Awareness, Memory,  Attention, Initiation, Sequencing, Problem solving, Safety/Judgement   Orientation impairments: Place, Time, Situation                   PT - Cognition Comments: Hx of dementia, spouse Cynthia Kelley present and reports she has been more somnolent today, was awake a lot overnight; frequent cues for her to open her eyes and for attention to task/activity. Following commands: Impaired Following commands impaired: Follows one step commands inconsistently    Cueing Cueing Techniques: Verbal cues, Visual cues, Gestural cues, Tactile cues  Exercises Total Joint Exercises Ankle Circles/Pumps: AAROM, Both, 10 reps, Supine, AROM (AA mostly due to pt lethargy/difficulty following instructions) Short Arc Quad: AROM, AAROM, Both, 10 reps, Supine (AA to PROM on LLE, A to AAROM on RLE) Hip ABduction/ADduction: AAROM, Both, 10 reps, Supine General Exercises - Lower Extremity Hip Flexion/Marching: AROM, AAROM, Both, 10 reps, Seated, Other (comment) (bed in chair posture; AROM on RLE, AAROM on LLE) Other Exercises Other Exercises: pink bone foam placed under her ankles to reduce pressure on her heels from the bed    General Comments General comments (skin integrity, edema, etc.): Checked BP with HOB totally flat: 88/62 (71) HR 76 bpm; BP with HOB 65 degrees and feet down: 88/54 HR 92-116 bpm (chronic aflutter/afib); defer standing BP due to pt soft BP and lethargy.      Pertinent Vitals/Pain Pain Assessment Pain Assessment: No/denies pain Breathing: normal Negative Vocalization: none Facial Expression: smiling or inexpressive Body Language: relaxed Consolability: no need to console PAINAD Score: 0 Pain Location: L hip with AAROM Pain Intervention(s): Monitored during session, Premedicated before session, Repositioned    Home Living                          Prior Function            PT Goals (current goals can now be found in the care plan section) Acute Rehab PT Goals Patient  Stated Goal: Per spouse for her to be more stable and moving better prior to going home. PT Goal Formulation: With patient/family Time For Goal Achievement: 12/31/23 Progress towards PT goals: Progressing toward goals    Frequency    Min 2X/week      PT Plan      Co-evaluation              AM-PAC PT "6 Clicks" Mobility   Outcome Measure  Help needed turning from your back to your side while in a flat bed without using bedrails?: A Little Help needed moving from lying on your back to sitting on the side of a flat bed without using bedrails?: A Lot Help needed moving to and from a bed to a chair (including a wheelchair)?: Total (too lethargic today) Help needed standing up from a chair using your arms (e.g., wheelchair or bedside chair)?: A Lot Help needed to walk in hospital room?: Total (anticipated today due to pt lethargy) Help needed climbing 3-5 steps with a railing? : Total 6 Click Score: 10  End of Session   Activity Tolerance: Patient limited by lethargy;Treatment limited secondary to medical complications (Comment);Other (comment) (BP 88/54 in upright seated posture in bed) Patient left: with call bell/phone within reach;in bed;with bed alarm set;Other (comment);with SCD's reapplied;with family/visitor present (heels floated) Nurse Communication: Mobility status;Other (comment) (Tachycardic when Mobilizing) PT Visit Diagnosis: Other abnormalities of gait and mobility (R26.89)     Time: 1240-1309 PT Time Calculation (min) (ACUTE ONLY): 29 min  Charges:    $Therapeutic Exercise: 8-22 mins $Therapeutic Activity: 8-22 mins PT General Charges $$ ACUTE PT VISIT: 1 Visit                     Ebonique Hallstrom P., PTA Acute Rehabilitation Services Secure Chat Preferred 9a-5:30pm Office: [phone]    Dorathy Kinsman [facility] 12/20/2023, 1:25 PM

## 2023-12-20 NOTE — Progress Notes (Addendum)
 Patient answered Admission Questions but not sure of accuracy of answers as was reported that patient can get confused.  Unsure if has advanced directives but patient reported no, Discharge assessment was done using notes,  notes did not indicate legal guardian.

## 2023-12-20 NOTE — Progress Notes (Signed)
 This patient keeps screaming she has to urinate using profanity I have explained multiple times she has a foley even showed her that she is voiding she was given 1 mg of Ativan she was scratching at her skin I bladder scanned 0 mls she was was given 5mg  Oxi IR and Robaxin 500 I don't if she is having bladder spasms

## 2023-12-20 NOTE — TOC Progression Note (Addendum)
 Transition of Care [facility]) - Progression Note    Patient Details  Name: Cynthia Kelley MRN: [id number] Date of Birth: June 25, 1956  Transition of Care [facility]) CM/SW Contact  Lorri Frederick, LCSW Phone Number: 12/20/2023, 10:33 AM  Clinical Narrative:   CSW spoke with pt husband [name].  He just returned from [facility] and is wanting to move forward with placement there.  CSW confirmed with Christine/[facility] that they can receive pt today.  1040: SNF auth request submitted and approved: [id number], 3 days: 3/25-3/27.  MD informed.  Expected Discharge Plan: Skilled Nursing Facility Barriers to Discharge: Continued Medical Work up  Expected Discharge Plan and Services In-house Referral: Clinical Social Work     Living arrangements for the past 2 months: Single Family Home                                       Social Determinants of Health (SDOH) Interventions SDOH Screenings   Food Insecurity: No Food Insecurity (12/16/2023)  Housing: Low Risk  (12/16/2023)  Transportation Needs: No Transportation Needs (12/16/2023)  Utilities: Not At Risk (12/16/2023)  Alcohol Screen: Low Risk  (12/16/2022)  Depression (PHQ2-9): Low Risk  (12/08/2023)  Financial Resource Strain: Low Risk  (12/16/2022)  Physical Activity: Inactive (12/16/2022)  Social Connections: Moderately Integrated (12/16/2023)  Stress: No Stress Concern Present (12/16/2022)  Tobacco Use: High Risk (12/16/2023)    Readmission Risk Interventions    03/01/2022   11:11 AM  Readmission Risk Prevention Plan  Transportation Screening Complete  HRI or Home Care Consult Complete  Social Work Consult for Recovery Care Planning/Counseling Complete  Palliative Care Screening --  Medication Review Oceanographer) Complete

## 2023-12-20 NOTE — Progress Notes (Signed)
 Cardiology Progress Note  Patient ID: Cynthia Kelley MRN: [id number] DOB: Aug 24, 1956 Date of Encounter: 12/20/2023 Primary Cardiologist: None  Subjective   Chief Complaint: None.   HPI: A-fib heart rate 90 to 100 bpm.  Denies any symptoms.  ROS:  All other ROS reviewed and negative. Pertinent positives noted in the HPI.     Telemetry  Overnight telemetry shows A-fib 90 to 100 bpm, which I personally reviewed.    Physical Exam   Vitals:   12/20/23 0000 12/20/23 0203 12/20/23 0441 12/20/23 0500  BP: 105/65 112/64 (!) 91/58 127/76  Pulse: (!) 117 79 87 (!) 104  Resp:  20    Temp: 98.3 F (36.8 C) (!) 97.4 F (36.3 C) (!) 97.4 F (36.3 C)   TempSrc: Oral Oral Axillary   SpO2: 100% 100% 97%   Weight:      Height:        Intake/Output Summary (Last 24 hours) at 12/20/2023 0846 Last data filed at 12/20/2023 0000 Gross per 24 hour  Intake 240 ml  Output 1050 ml  Net -810 ml       12/16/2023   10:53 AM 12/08/2023    8:07 AM 05/29/2023   11:12 AM  Last 3 Weights  Weight (lbs) 135 lb 119 lb 118 lb  Weight (kg) 61.236 kg 53.978 kg 53.524 kg    Body mass index is 21.46 kg/m.  General: Well nourished, well developed, in no acute distress Head: Atraumatic, normal size  Eyes: PEERLA, EOMI  Neck: Supple, no JVD Endocrine: No thryomegaly Cardiac: Normal S1, S2; irregular rhythm, no murmurs Lungs: Clear to auscultation bilaterally, no wheezing, rhonchi or rales  Abd: Soft, nontender, no hepatomegaly  Ext: No edema, pulses 2+ Musculoskeletal: No deformities, BUE and BLE strength normal and equal Skin: Warm and dry, no rashes   Neuro: Alert and oriented to person, place, time, and situation, CNII-XII grossly intact, no focal deficits  Psych: Normal mood and affect   Cardiac Studies  TTE 03/01/2023 SUMMARY  Recommend follow up transthoracic echocardiogram in every 1-2 years for routine surveillance of  moderate valvular heart disease. Change in cardiac exam or clinical status may  warrant earlier  reassessment. 2020 ACC-AHA Guideline  Circ. vol 143, e72-e227.  Improved RVSP from prior study.  Normal LV size, wall thickness, wall motion and systolic function with ejection fraction 65-70%  Left ventricular diastolic function and atrial pressure are indeterminate due to  mitral annular  calcification  The right ventricle is normal in size and function.  The left atrium is severely dilated.  The right atrium is moderately dilated.  There is probably mild mitral stenosis due to heavy mitral annualr calcification. There is moderate  mitral regurgitation.  There is mild to moderate tricuspid regurgitation.  Mild pulmonary hypertension. Estimated right ventricular systolic pressure is 39 mmHg.  IVC size was normal. Right atrial pressure is estimated to be 8 mm Hg.  The aortic sinus is normal size.   Patient Profile  Cynthia Kelley is a 68 y.o. female with alcohol abuse, COPD, HFpEF, persistent atrial fibrillation/flutter admitted on 12/15/2023 with mechanical fall and left femoral neck fracture status post operative repair.  Cardiology consulted for A-fib with RVR management.  Assessment & Plan   # A-fib/flutter with RVR -Known history of this.  Now here with alcohol withdrawal and fall and hip fracture.  Rates are much improved on metoprolol 25 mg every 6 hours.  Transition to 50 mg of metoprolol tartrate twice daily.  I am okay  with her heart rate as long as is less than 110 bpm.  She is here with alcohol withdrawal anemia due to surgery.  Her heart rate will not be perfect.  Would recommend to stop amiodarone.  Continue Eliquis.  She has follow-up with her outpatient cardiologist in [facility].  # Left hip fracture status post repair # Anemia -Slowly recovering.  # Alcohol abuse -CIWA protocol  # HFpEF -Euvolemic.  Lasix as needed.  [facility] will sign off.   Medication Recommendations: Metoprolol to tartrate 50 mg twice daily.  Continue Eliquis.  Hold  amiodarone.  Outpatient follow-up with her cardiologist in [facility]. Other recommendations (labs, testing, etc): As above Follow up as an outpatient: She should see her cardiologist in [facility] once discharged within 4 to 6 weeks  For questions or updates, please contact [facility] Please consult [email or web address] for contact info under      Signed, Gerri Spore T. Flora Lipps, MD, [place]  [facility]  12/20/2023 8:46 AM

## 2023-12-20 NOTE — Progress Notes (Signed)
 PROGRESS NOTE    Cynthia Kelley  ZOX:[id number] DOB: 06-Jun-1956 DOA: 12/15/2023 PCP: Anabel Halon, MD   Brief Narrative:  The patient is a 68 year old Caucasian female with past medical history significant for Mann[place] to alcohol abuse, atrial flutter, dementia, COPD, diastolic CHF and other comorbidities who presents with complaint of a fall.  Initially presented to [facility] and transferred to [facility] for further evaluation.  Patient drinks 6 alcoholic beverages daily and was intoxicated prior to coming in.  Further workup was done and she was found to have a left intratrochanteric femur fracture and orthopedic surgery was consulted and she is now status post cephalomedullary nailing.    Hospitalization has been complicated by postoperative acute blood loss anemia, alcohol intoxication with concern for withdrawal and A-fib with RVR.  She is also having some urinary retention so Foley catheter been placed.  Cardiology was consulted for A-fib with RVR and heart rates are improved but now after medication adjustment she became hypotensive.  She is status post two 500 mL boluses and blood pressure still remains on the softer side.  Will add TED hose and reduce her beta-blocker now. PT/OT recommending SNF and plan was to D/C her today however given her hypotension we will observe her overnight.  Assessment and Plan:  Closed left intertrochanteric femur fracture (HCC) in the setting of Fall s/p Cephalomedullary Nailing: Patient intoxicated with alcohol on admit. Pelvic x-ray >  Left femoral intertrochanteric fracture with severe varus angulation.  -Head CT negative for acute abnormality.   -She has chronic right femur fracture , sustained from a fall, that has been present since 6/82023, per notes patient and spouse opted for nonoperative management.  -Now s/p surgical intervention and a deep bone biopsy and further care per Orthopedic Surgery. AC w/ Apixaban has been resumed.   -Pain control with  Acetaminophen 650 mg po/RC q6hprn, Oxycodone IR 5-10 mg q4hprn Moderate Pain, and IV Hydromorphone 0.5-1 mg q4hprn Severe Pain, however this may be needed adjusted given her somnolence today. C/w Metaclopramide 5-10 mg po/IV q8hprn and Ondansetron 4 mg po/IV q6hprn Nausea.  -C/w Bowel Regimen. WBAT LLE and PT/OT recommending SNF. -She now has left knee pain the orthopedic surgery team is ordering her x-ray which showed severe tricompartmental degenerative changes of the knee with no acute displaced fracture or dislocation. -PT OT recommending SNF and can be D/C when Hypotension improves.   Alcohol intoxication (HCC) with Concern for Withdrawal: Blood alcohol level of 91 at time of admission.  er records and husband reports patient drinks approximately 6 alcoholic beverage per day. Continue CIWA protocol. C/w MVI+Minerals 1 tab po daily, Folic Acid 1 mg po Daily and Thiamine 100 mg po/IV. Cessation counseling provided.  Her CIWA score last night was 9.     Paroxysmal Atrial Fibrillation (HCC) w/ RVR: EKG shows atrial flutter at time of admission. TSH is now 0.482. Off of Apixaban for approximately 1 week Prior to Surgery but resumed 3/22 by Ortho @ 5 mg po BID. Given her uncontrolled rates Cards was consulted and now discontinued her Amiodarone 200 mg p.o. nightly and discontinued her Metoprolol succinate and started on scheduled Metoprolol Tartrate.  They now consolidated it to 50 mg twice daily but will reduce to 25 mg twice daily given her Hypotension.  CTM on Telemetry as HR is improving.    Chronic Diastolic CHF (congestive heart failure) (HCC): Stable and compensated. Follow daily weights, strict I's and O's and maintain adequate hydration. Given a 1 Liter  bolus given HR yesterday and will give another two 500 mL boluses today given her hypotension. CTM for S/Sx of Volume Overload. Holding Furosemide 20 mg po Daily currently. She is -1.278 liters since admit.  Cardiology consulted here.  Hypotension:  Suspected to be in setting of her BB.  Adjusted and reduced BB Metoprolol Tartrate 50 mg p.o. twice daily to 25 mg p.o. twice daily.  Given 2 500 mL boluses.  Also added TED hose. CTM BP per Protocol. BP dropped to  to as low as 75/45 and is now 94/56. Check Cortisol Level in the AM. Check Orthostatics in the AM  HypoNa+: Na+ is now stable and improved to 137. CTM and Trend and repeat CMP in the AM  Hypokalemia: K+ is now 4.4. CTM and repeat CMP int eh AM  Acute Urinary Retention: Was retaining urine so had to have a Foley catheter placed.  She subsequently pulled out overnight and was replaced.  Will need to continue to try to get her mobilizing out of bed and currently doing TOV 12/20/23.  Leukocytosis: Resolved as WBC is now 9.7.  Initial elevation likely reactive in the setting of Surgery. CTM for S/Sx of Infection. Repeat CBC in the AM  Vitamin D Deficiency: Vitamin D Level was 20.33. Increased Cholecalciferol 2,000 international units daily and will continue for 90 days  Hyperbilirubinemia: Bilirubin is now back to 1.0. CTM and trend and repeat CMP in the AM.   Post-Operative ABLA: Hgb/Hct went from 11.6/35.3 -> 8.7/26.5 (stable the last few days). Checked Anemia Panel and showed an iron level of 24, UIBC of 262, TIBC of 286, saturation ratio of 8%, ferritin level of 416, folate level of 16.9, vitamin B12 level of 339. CTM for S/Sx of Bleeding; No overt bleeding noted.  C/w Niferex supplementation given her anemia.  Repeat CBC in the a.m.  COPD: Currently not decompensated. CTM Respiratory carefully and will utilize bronchodilator management and the use of oxygen supplementation if necessary  Dementia: C/w Mirtazapine 7.5 mg po at bedtime. Delirium Precautions. Limit Narcotics if able  Primary Lung Cancer with Metastasis from Lung to Other Site: Not receiving active treatment currently.  Will need follow-up with primary medical oncology team @ [facility]   Hypoalbuminemia: Albumin  now 2.9 again. CTM and Trend and repeat CMP w/in 1 week   DVT prophylaxis: Place TED hose Start: 12/20/23 1402 SCDs Start: 12/16/23 1435 SCDs Start: 12/15/23 2313 apixaban (ELIQUIS) tablet 5 mg    Code Status: Full Code Family Communication: Discussed with the patient's husband at bedside  Disposition Plan:  Level of care: Telemetry Medical Status is: Inpatient Remains inpatient appropriate because: Needs further clinical improvement in her blood pressures given that she drop fairly significantly today.  If blood pressures improve and heart rate is relatively well-controlled she can be discharged   Consultants:  Cardiology Orthopedic surgery  Procedures:  As delineated as above  Antimicrobials:  Anti-infectives (From admission, onward)    Start     Dose/Rate Route Frequency Ordered Stop   12/16/23 2000  ceFAZolin (ANCEF) IVPB 2g/100 mL premix        2 g 200 mL/hr over 30 Minutes Intravenous Every 8 hours 12/16/23 1434 12/17/23 1414       Subjective: Seen and examined at bedside was doing okay.  Did not complain of any pain.  No nausea or vomiting.  Medications were adjusted and heart rate was better controlled however she became hypotensive and was lethargic.  Will give her  some IV fluid boluses and order TED hose and repeat blood pressure.  Will hold her discharge today and anticipate discharging in next 24 hours if her blood pressure is improved and if heart rate is controlled  Objective: Vitals:   12/20/23 1300 12/20/23 1358 12/20/23 1427 12/20/23 1646  BP: (!) 88/54 (!) 84/50 (!) 81/53 (!) 94/56  Pulse: 92   97  Resp:    17  Temp:    98 F (36.7 C)  TempSrc:      SpO2:    93%  Weight:      Height:        Intake/Output Summary (Last 24 hours) at 12/20/2023 1805 Last data filed at 12/20/2023 0000 Gross per 24 hour  Intake 240 ml  Output 250 ml  Net -10 ml   Filed Weights   12/16/23 1053  Weight: 61.2 kg   Examination: Physical Exam:  Constitutional: Thin  Caucasian female who is calm Respiratory: Diminish to auscultation bilaterally, no wheezing, rales, rhonchi or crackles. Normal respiratory effort and patient is not tachypenic. No accessory muscle use. Unlabored Breathing  Cardiovascular: Irregularly Irregular, no murmurs / rubs / gallops. S1 and S2 auscultated. No extremity edema. Abdomen: Soft, non-tender, non-distended. Bowel sounds positive.  GU: Deferred. Musculoskeletal: No clubbing / cyanosis of digits/nails. No joint deformity upper and lower extremities.  Skin: No rashes, lesions, ulcers on a limited skin evaluation. No induration; Warm and dry.  Neurologic: CN 2-12 grossly intact with no focal deficits. Romberg sign and cerebellar reflexes not assessed.  Psychiatric: Awake and alert  Data Reviewed: I have personally reviewed following labs and imaging studies  CBC: Recent Labs  Lab 12/15/23 1847 12/16/23 0554 12/17/23 0432 12/18/23 0543 12/19/23 0822 12/20/23 0637  WBC 8.3 8.0 11.1* 7.9 9.2 9.7  NEUTROABS 5.7  --   --  5.6 6.6 7.1  HGB 13.7 11.6* 9.6* 9.3* 8.8* 8.7*  HCT 41.0 35.3* 29.2* 28.3* 27.0* 26.5*  MCV 98.3 98.3 98.3 98.3 100.4* 100.0  PLT 296 267 236 223 253 265   Basic Metabolic Panel: Recent Labs  Lab 12/15/23 2107 12/16/23 0554 12/17/23 0432 12/18/23 0543 12/19/23 0822 12/20/23 0637  NA  --  133* 133* 138 138 137  K  --  4.0 4.2 3.3* 4.9 4.4  CL  --  103 106 104 106 106  CO2  --  23 22 24 25 27   GLUCOSE  --  103* 146* 91 111* 103*  BUN  --  20 13 9 18 22   CREATININE  --  0.55 0.68 0.61 0.80 0.88  CALCIUM  --  7.4* 8.5* 9.3 8.9 8.5*  MG 1.9  --  1.8 1.8 1.9 1.8  PHOS 2.8  --  3.9 2.7 3.8 4.5   GFR: Estimated Creatinine Clearance: 57.6 mL/min (by C-G formula based on SCr of 0.88 mg/dL). Liver Function Tests: Recent Labs  Lab 12/15/23 1917 12/17/23 0432 12/18/23 0543 12/19/23 0822 12/20/23 0637  AST 37 29 36 25 25  ALT 27 19 18 15 15   ALKPHOS 90 59 58 66 76  BILITOT 0.5 0.9 1.3* 0.9 1.0   PROT 7.1 5.8* 6.3* 5.8* 6.0*  ALBUMIN 3.9 3.2* 3.5 2.9* 2.9*   No results for input(s): "LIPASE", "AMYLASE" in the last 168 hours. No results for input(s): "AMMONIA" in the last 168 hours. Coagulation Profile: Recent Labs  Lab 12/15/23 2107  INR 1.0   Cardiac Enzymes: No results for input(s): "CKTOTAL", "CKMB", "CKMBINDEX", "TROPONINI" in the last 168 hours. BNP (  last 3 results) No results for input(s): "PROBNP" in the last 8760 hours. HbA1C: No results for input(s): "HGBA1C" in the last 72 hours. CBG: No results for input(s): "GLUCAP" in the last 168 hours. Lipid Profile: No results for input(s): "CHOL", "HDL", "LDLCALC", "TRIG", "CHOLHDL", "LDLDIRECT" in the last 72 hours. Thyroid Function Tests: No results for input(s): "TSH", "T4TOTAL", "FREET4", "T3FREE", "THYROIDAB" in the last 72 hours. Anemia Panel: Recent Labs    12/18/23 0543  VITAMINB12 339  FOLATE 16.9  FERRITIN 416*  TIBC 286  IRON 24*  RETICCTPCT 1.9   Sepsis Labs: No results for input(s): "PROCALCITON", "LATICACIDVEN" in the last 168 hours.  No results found for this or any previous visit (from the past 240 hours).   Radiology Studies: No results found.  Scheduled Meds:  acetaminophen  650 mg Oral Q6H   Or   acetaminophen  650 mg Rectal Q6H   apixaban  5 mg Oral BID   Chlorhexidine Gluconate Cloth  6 each Topical Daily   cholecalciferol  2,000 Units Oral Daily   docusate sodium  100 mg Oral BID   folic acid  1 mg Oral Daily   iron polysaccharides  150 mg Oral Daily   metoprolol tartrate  25 mg Oral BID   mirabegron ER  50 mg Oral Daily   mirtazapine  7.5 mg Oral QHS   multivitamin with minerals  1 tablet Oral Daily   thiamine  100 mg Oral Daily   Continuous Infusions:   LOS: 5 days   Marguerita Merles, DO [facility] Available via Epic secure chat 7am-7pm After these hours, please refer to coverage provider listed on [email or web address] 12/20/2023, 6:05 PM

## 2023-12-21 DIAGNOSIS — I5030 Unspecified diastolic (congestive) heart failure: Secondary | ICD-10-CM | POA: Diagnosis not present

## 2023-12-21 DIAGNOSIS — I4891 Unspecified atrial fibrillation: Secondary | ICD-10-CM

## 2023-12-21 DIAGNOSIS — F1010 Alcohol abuse, uncomplicated: Secondary | ICD-10-CM | POA: Diagnosis not present

## 2023-12-21 DIAGNOSIS — I480 Paroxysmal atrial fibrillation: Secondary | ICD-10-CM | POA: Diagnosis not present

## 2023-12-21 DIAGNOSIS — D649 Anemia, unspecified: Secondary | ICD-10-CM | POA: Diagnosis not present

## 2023-12-21 DIAGNOSIS — S72002A Fracture of unspecified part of neck of left femur, initial encounter for closed fracture: Secondary | ICD-10-CM | POA: Diagnosis not present

## 2023-12-21 LAB — CBC WITH DIFFERENTIAL/PLATELET
Abs Immature Granulocytes: 0.04 10*3/uL (ref 0.00–0.07)
Basophils Absolute: 0 10*3/uL (ref 0.0–0.1)
Basophils Relative: 1 %
Eosinophils Absolute: 0.2 10*3/uL (ref 0.0–0.5)
Eosinophils Relative: 2 %
HCT: 25.7 % — ABNORMAL LOW (ref 36.0–46.0)
Hemoglobin: 8.5 g/dL — ABNORMAL LOW (ref 12.0–15.0)
Immature Granulocytes: 1 %
Lymphocytes Relative: 13 %
Lymphs Abs: 1.1 10*3/uL (ref 0.7–4.0)
MCH: 32.7 pg (ref 26.0–34.0)
MCHC: 33.1 g/dL (ref 30.0–36.0)
MCV: 98.8 fL (ref 80.0–100.0)
Monocytes Absolute: 0.8 10*3/uL (ref 0.1–1.0)
Monocytes Relative: 10 %
Neutro Abs: 6.4 10*3/uL (ref 1.7–7.7)
Neutrophils Relative %: 73 %
Platelets: 294 10*3/uL (ref 150–400)
RBC: 2.6 MIL/uL — ABNORMAL LOW (ref 3.87–5.11)
RDW: 13.2 % (ref 11.5–15.5)
WBC: 8.6 10*3/uL (ref 4.0–10.5)
nRBC: 0 % (ref 0.0–0.2)

## 2023-12-21 LAB — COMPREHENSIVE METABOLIC PANEL
ALT: 17 U/L (ref 0–44)
AST: 28 U/L (ref 15–41)
Albumin: 2.9 g/dL — ABNORMAL LOW (ref 3.5–5.0)
Alkaline Phosphatase: 91 U/L (ref 38–126)
Anion gap: 6 (ref 5–15)
BUN: 20 mg/dL (ref 8–23)
CO2: 26 mmol/L (ref 22–32)
Calcium: 8.5 mg/dL — ABNORMAL LOW (ref 8.9–10.3)
Chloride: 104 mmol/L (ref 98–111)
Creatinine, Ser: 0.71 mg/dL (ref 0.44–1.00)
GFR, Estimated: >60 mL/min (ref >60)
Glucose, Bld: 121 mg/dL — ABNORMAL HIGH (ref 70–99)
Potassium: 4.3 mmol/L (ref 3.5–5.1)
Sodium: 136 mmol/L (ref 135–145)
Total Bilirubin: 1.3 mg/dL — ABNORMAL HIGH (ref 0.0–1.2)
Total Protein: 6.1 g/dL — ABNORMAL LOW (ref 6.5–8.1)

## 2023-12-21 LAB — MAGNESIUM: Magnesium: 2 mg/dL (ref 1.7–2.4)

## 2023-12-21 LAB — PHOSPHORUS: Phosphorus: 3.3 mg/dL (ref 2.5–4.6)

## 2023-12-21 MED ORDER — MELATONIN 3 MG PO TABS
3.0000 mg | ORAL_TABLET | Freq: Every day | ORAL | Status: DC
  Administered 2023-12-21 – 2023-12-24 (×4): 3 mg via ORAL
  Filled 2023-12-21 (×4): qty 1

## 2023-12-21 MED ORDER — METOPROLOL TARTRATE 50 MG PO TABS
50.0000 mg | ORAL_TABLET | Freq: Two times a day (BID) | ORAL | Status: DC
  Administered 2023-12-21 – 2023-12-25 (×7): 50 mg via ORAL
  Filled 2023-12-21 (×8): qty 1

## 2023-12-21 MED ORDER — QUETIAPINE FUMARATE 25 MG PO TABS
25.0000 mg | ORAL_TABLET | Freq: Every evening | ORAL | Status: DC | PRN
  Administered 2023-12-21: 25 mg via ORAL
  Filled 2023-12-21: qty 1

## 2023-12-21 MED ORDER — DIGOXIN 125 MCG PO TABS
0.1250 mg | ORAL_TABLET | Freq: Every day | ORAL | Status: DC
  Administered 2023-12-21 – 2023-12-25 (×5): 0.125 mg via ORAL
  Filled 2023-12-21 (×5): qty 1

## 2023-12-21 MED ORDER — QUETIAPINE FUMARATE 25 MG PO TABS
25.0000 mg | ORAL_TABLET | Freq: Once | ORAL | Status: AC
  Administered 2023-12-21: 25 mg via ORAL
  Filled 2023-12-21: qty 1

## 2023-12-21 MED ORDER — METOPROLOL TARTRATE 25 MG PO TABS
25.0000 mg | ORAL_TABLET | Freq: Once | ORAL | Status: AC
  Administered 2023-12-21: 25 mg via ORAL
  Filled 2023-12-21: qty 1

## 2023-12-21 MED ORDER — POLYETHYLENE GLYCOL 3350 17 G PO PACK
17.0000 g | PACK | Freq: Two times a day (BID) | ORAL | Status: AC
  Administered 2023-12-21 – 2023-12-22 (×4): 17 g via ORAL
  Filled 2023-12-21 (×4): qty 1

## 2023-12-21 NOTE — Progress Notes (Signed)
 Mobility Specialist Progress Note:   12/21/23 1114  Mobility  Activity Stood at bedside  Level of Assistance Contact guard assist, steadying assist  Assistive Device None  LLE Weight Bearing Per Provider Order WBAT  Activity Response Tolerated fair  Mobility Referral Yes  Mobility visit 1 Mobility  Mobility Specialist Start Time (ACUTE ONLY) 1045  Mobility Specialist Stop Time (ACUTE ONLY) 1101  Mobility Specialist Time Calculation (min) (ACUTE ONLY) 16 min   Pt received standing in room attempting to ambulate. Pt needing mod verbal cues to redirect to bed. Pt impulsively attempting to take a few steps near bedside needing minA to prevent fall. Pt grimacing d/t LLE pain. Pt returned to supine with bed alarm on and all needs met. RN notified.   Leory Plowman  Mobility Specialist Please contact via [employer] office at [phone]

## 2023-12-21 NOTE — Progress Notes (Signed)
 [facility] PROGRESS NOTE    Progress Note  Cynthia Kelley  ZOX:[id number] DOB: 07/28/1956 DOA: 12/15/2023 PCP: Anabel Halon, MD     Brief Narrative:   Cynthia Kelley is an 68 y.o. female past medical history significant for past medical history of alcohol abuse, atrial flutter, dementia, chronic diastolic dysfunction presented to [facility] on transfer to [facility] after fall she was found to have a left intertrochanteric femur fracture orthopedic surgery was consulted and now she status post surgical intervention.  Elicitation was complicated by acute blood loss, alcoholic withdrawals and A-fib with RVR.  She was having urinary retention so Foley had to be placed cardiology was consulted for A-fib with RVR rate improved with medication, but it was complicated with hypotension.  PT evaluated the patient recommended skilled nursing facility placement, but due to her hypotension she was observed overnight   Assessment/Plan:   Closed left femoral fracture (HCC) secondary to mechanical fall in the setting of inebriation. On admission patient was inebriated. Imaging showed left hip fracture. Postsurgical intramedullary nailing of the left intertrochanteric femur on 12/16/2023 Needed surgery recommended apixaban DVT prophylaxis. Was complaining of left knee pain and x-ray shows severe osteoarthritis of the knee which will need to be followed up with orthopedic surgery as an outpatient. PT evaluated the patient, she will need skilled nursing facility placement.  Hypotension: Beta-blocker was reduced she was given a liter bolus Her blood pressure is improved.  Alcohol intoxication : Alcohol level 91 admission. She started withdrawing started on thiamine and folate. Started on Ativan protocol.  Course slowly coming down.  Paroxysmal A-fib: She was consulted she was started on amiodarone which has now been discontinued. Heart rate is still fast this morning, will defer to  cardiology.  Chronic diastolic dysfunction: Appears to be compensated. Furosemide was held. Continue beta-blocker and, diuretics can be resumed as an outpatient.  Hyponatremia: Now improved.  Hypokalemia: Repleted now improved.  Acute urinary retention: Try voiding trial.  Leukocytosis: Likely reactive.  Vitamin D deficiency: Continue vitamin supplementation.  Postoperative acute blood loss anemia/iron deficiency anemia: Hemoglobin is relatively stable. Iron studies were checked and she was given IV iron.  COPD: Continue inhalers.  Dementia: Continue delirium precautions, continue Remeron.  Primary lung cancer with metastases from lung to other sites: On chemotherapy at this point in time we will follow-up with [facility] as an outpatient.  Moderate protein caloric malnutrition: Noted.   DVT prophylaxis: eliquis Family Communication:husband Status is: Inpatient Remains inpatient appropriate because: A-fib with RVR and hypotension    Code Status:     Code Status Orders  (From admission, onward)           Start     Ordered   12/15/23 2310  Full code  Continuous       Question:  By:  Answer:  Consent: discussion documented in EHR   12/15/23 2311           Code Status History     Date Active Date Inactive Code Status Order ID Comments User Context   02/27/2022 0434 03/04/2022 2224 DNR [id number]  Frankey Shown, DO ED   03/04/2021 1217 03/06/2021 2037 DNR [id number]  Ulice Bold, NP Inpatient   03/03/2021 0351 03/04/2021 1217 Full Code [id number]  Frankey Shown, DO ED   01/12/2021 0748 01/15/2021 1607 Full Code [id number]  Zierle-Ghosh, Asia B, DO Inpatient         IV Access:   Peripheral IV   Procedures and  diagnostic studies:   No results found.   Medical Consultants:   None.   Subjective:    Jamaiya Tunnell pain is controlled.  Objective:    Vitals:   12/20/23 2020 12/21/23 0400 12/21/23 0749 12/21/23 0823   BP: (!) 94/58 124/62 134/79 134/79  Pulse: 87 (!) 104 (!) 117 (!) 117  Resp: 16  18   Temp: 98.4 F (36.9 C) 98.3 F (36.8 C) 98.9 F (37.2 C)   TempSrc:  Oral Oral   SpO2: 91% 96% 97%   Weight:      Height:       SpO2: 97 % O2 Flow Rate (L/min): 6 L/min FiO2 (%): 21 %   Intake/Output Summary (Last 24 hours) at 12/21/2023 1011 Last data filed at 12/20/2023 2145 Gross per 24 hour  Intake 740.44 ml  Output 450 ml  Net 290.44 ml   Filed Weights   12/16/23 1053  Weight: 61.2 kg    Exam: General exam: In no acute distress. Respiratory system: Good air movement and clear to auscultation. Cardiovascular system: S1 & S2 heard, RRR. No JVD. Gastrointestinal system: Abdomen is nondistended, soft and nontender.  Extremities: No pedal edema. Skin: No rashes, lesions or ulcers Psychiatry: Judgement and insight appear normal. Mood & affect appropriate.    Data Reviewed:    Labs: Basic Metabolic Panel: Recent Labs  Lab 12/17/23 0432 12/18/23 0543 12/19/23 0822 12/20/23 0637 12/21/23 0617  NA 133* 138 138 137 136  K 4.2 3.3* 4.9 4.4 4.3  CL 106 104 106 106 104  CO2 22 24 25 27 26   GLUCOSE 146* 91 111* 103* 121*  BUN 13 9 18 22 20   CREATININE 0.68 0.61 0.80 0.88 0.71  CALCIUM 8.5* 9.3 8.9 8.5* 8.5*  MG 1.8 1.8 1.9 1.8 2.0  PHOS 3.9 2.7 3.8 4.5 3.3   GFR Estimated Creatinine Clearance: 63.4 mL/min (by C-G formula based on SCr of 0.71 mg/dL). Liver Function Tests: Recent Labs  Lab 12/17/23 0432 12/18/23 0543 12/19/23 0822 12/20/23 0637 12/21/23 0617  AST 29 36 25 25 28   ALT 19 18 15 15 17   ALKPHOS 59 58 66 76 91  BILITOT 0.9 1.3* 0.9 1.0 1.3*  PROT 5.8* 6.3* 5.8* 6.0* 6.1*  ALBUMIN 3.2* 3.5 2.9* 2.9* 2.9*   No results for input(s): "LIPASE", "AMYLASE" in the last 168 hours. No results for input(s): "AMMONIA" in the last 168 hours. Coagulation profile Recent Labs  Lab 12/15/23 2107  INR 1.0   COVID-19 Labs  No results for input(s): "DDIMER",  "FERRITIN", "LDH", "CRP" in the last 72 hours.  Lab Results  Component Value Date   SARSCOV2NAA NEGATIVE 09/04/2022   SARSCOV2NAA NEGATIVE 03/03/2021   SARSCOV2NAA NEGATIVE 01/12/2021    CBC: Recent Labs  Lab 12/15/23 1847 12/16/23 0554 12/17/23 0432 12/18/23 0543 12/19/23 0822 12/20/23 0637 12/21/23 0617  WBC 8.3   < > 11.1* 7.9 9.2 9.7 8.6  NEUTROABS 5.7  --   --  5.6 6.6 7.1 6.4  HGB 13.7   < > 9.6* 9.3* 8.8* 8.7* 8.5*  HCT 41.0   < > 29.2* 28.3* 27.0* 26.5* 25.7*  MCV 98.3   < > 98.3 98.3 100.4* 100.0 98.8  PLT 296   < > 236 223 253 265 294   < > = values in this interval not displayed.   Cardiac Enzymes: No results for input(s): "CKTOTAL", "CKMB", "CKMBINDEX", "TROPONINI" in the last 168 hours. BNP (last 3 results) No results for input(s): "PROBNP" in the  last 8760 hours. CBG: No results for input(s): "GLUCAP" in the last 168 hours. D-Dimer: No results for input(s): "DDIMER" in the last 72 hours. Hgb A1c: No results for input(s): "HGBA1C" in the last 72 hours. Lipid Profile: No results for input(s): "CHOL", "HDL", "LDLCALC", "TRIG", "CHOLHDL", "LDLDIRECT" in the last 72 hours. Thyroid function studies: No results for input(s): "TSH", "T4TOTAL", "T3FREE", "THYROIDAB" in the last 72 hours.  Invalid input(s): "FREET3" Anemia work up: No results for input(s): "VITAMINB12", "FOLATE", "FERRITIN", "TIBC", "IRON", "RETICCTPCT" in the last 72 hours. Sepsis Labs: Recent Labs  Lab 12/18/23 0543 12/19/23 2956 12/20/23 0637 12/21/23 0617  WBC 7.9 9.2 9.7 8.6   Microbiology No results found for this or any previous visit (from the past 240 hours).   Medications:    acetaminophen  650 mg Oral Q6H   Or   acetaminophen  650 mg Rectal Q6H   apixaban  5 mg Oral BID   Chlorhexidine Gluconate Cloth  6 each Topical Daily   cholecalciferol  2,000 Units Oral Daily   docusate sodium  100 mg Oral BID   folic acid  1 mg Oral Daily   iron polysaccharides  150 mg Oral  Daily   metoprolol tartrate  25 mg Oral BID   mirabegron ER  50 mg Oral Daily   mirtazapine  7.5 mg Oral QHS   multivitamin with minerals  1 tablet Oral Daily   thiamine  100 mg Oral Daily   Continuous Infusions:    LOS: 6 days   Marinda Elk  [facility]  12/21/2023, 10:11 AM

## 2023-12-21 NOTE — Progress Notes (Signed)
 Occupational Therapy Treatment Patient Details Name: Cynthia Kelley MRN: [id number] DOB: [birth year] Today's Date: 12/21/2023   History of present illness 68 y.o. female presented to the ED 3/21 with complaints of a fall on the tile floor in her bathroom today. Pelvic x-ray shows left femoral intertrochanteric fracture with severe varus angulation. Pt is now s/p IM nail on 3/21. PMH alcohol abuse, atrial flutter, dementia with behavorial disturbance, COPD, diastolic CHF, Lung CA with mets to lung, L knee surgeries.   OT comments  Pt internally motivated to complete bathroom transfer with bed alarm sounding and not following any safety cues from staff. Pt standing and holding bed rails. Pt returned to sitting and transferred full distance to the bathroom on BSC with RW. Pt requires max cues and max (A) to stay in the RW. Recommendation for SNF and high fall risk.       If plan is discharge home, recommend the following:  Two people to help with walking and/or transfers;Two people to help with bathing/dressing/bathroom   Equipment Recommendations  BSC/3in1;Wheelchair (measurements OT);Wheelchair cushion (measurements OT);Hospital bed;Other (comment)    Recommendations for Other Services PT consult    Precautions / Restrictions Precautions Precautions: Fall Recall of Precautions/Restrictions: Impaired Precaution/Restrictions Comments: Watch HR Restrictions Weight Bearing Restrictions Per Provider Order: Yes LLE Weight Bearing Per Provider Order: Weight bearing as tolerated       Mobility Bed Mobility Overal bed mobility: Needs Assistance Bed Mobility: Sit to Supine       Sit to supine: Min assist   General bed mobility comments: bed rails up for safety    Transfers Overall transfer level: Needs assistance Equipment used: Rolling walker (2 wheels) Transfers: Sit to/from Stand Sit to Stand: Max assist           General transfer comment: pt not following safety cues,  grabbing at environmental supports abandoning the RW. pt needs tactile input to keep hands on RW. pt attempting to walk using the bed rails and not the RW. pt benefits from L LE first and then R LE sequence     Balance Overall balance assessment: Needs assistance Sitting-balance support: Bilateral upper extremity supported, Feet supported Sitting balance-Leahy Scale: Poor     Standing balance support: Bilateral upper extremity supported, During functional activity, Reliant on assistive device for balance Standing balance-Leahy Scale: Poor                             ADL either performed or assessed with clinical judgement   ADL Overall ADL's : Needs assistance/impaired                         Toilet Transfer: Maximal assistance;Ambulation;Rolling walker (2 wheels);BSC/3in1;Cueing for sequencing;Cueing for safety Toilet Transfer Details (indicate cue type and reason): BSC pulled behind patient to have her sit safety and decrease fall risk due to her impulsive movement to void bladder Toileting- Clothing Manipulation and Hygiene: Contact guard assist;Sitting/lateral lean       Functional mobility during ADLs: Maximal assistance;Rolling walker (2 wheels);Cueing for sequencing;Cueing for safety      Extremity/Trunk Assessment              Vision       Perception     Praxis     Communication Communication Communication: Impaired   Cognition Arousal: Alert Behavior During Therapy: Restless, Impulsive Cognition: Cognition impaired  OT - Cognition Comments: pt attempting to transfer without help and not following 1 step cues. pt not following directions for safety due to internal drive to void bladder. Pt needs max verbal cues and tactile cue to sit for safety to allow staff to add socks, remove barrier mat and apply RW in front of patient. pt attempting to stand x3 during this safety time frame                 Following commands:  Impaired        Cueing   Cueing Techniques: Verbal cues, Gestural cues, Tactile cues, Visual cues  Exercises      Shoulder Instructions       General Comments pt reports dizzy and fatigue after transfer. pt supine so unable to get a reading after transfer quickly enough to establish and changes. pt was standing on arrival . BP was not obtained during session    Pertinent Vitals/ Pain       Pain Assessment Pain Assessment: Faces Faces Pain Scale: Hurts whole lot Pain Location: L hip Pain Descriptors / Indicators: Discomfort, Grimacing Pain Intervention(s): Monitored during session, Repositioned, Limited activity within patient's tolerance (pt states "oh god")  Home Living                                          Prior Functioning/Environment              Frequency  Min 2X/week        Progress Toward Goals  OT Goals(current goals can now be found in the care plan section)  Progress towards OT goals: Progressing toward goals  Acute Rehab OT Goals Patient Stated Goal: to go to the bathroom OT Goal Formulation: Patient unable to participate in goal setting Time For Goal Achievement: 01/01/24 Potential to Achieve Goals: Good ADL Goals Pt Will Perform Eating: with supervision;sitting Pt Will Perform Grooming: with set-up;sitting Pt Will Transfer to Toilet: with mod assist;stand pivot transfer;bedside commode Additional ADL Goal #1: pt will complete bed mobility min (A) in and out of bed  Plan      Co-evaluation                 AM-PAC OT "6 Clicks" Daily Activity     Outcome Measure   Help from another person eating meals?: A Little Help from another person taking care of personal grooming?: A Little Help from another person toileting, which includes using toliet, bedpan, or urinal?: A Lot Help from another person bathing (including washing, rinsing, drying)?: A Lot Help from another person to put on and taking off regular upper body  clothing?: A Lot Help from another person to put on and taking off regular lower body clothing?: A Lot 6 Click Score: 14    End of Session Equipment Utilized During Treatment: Gait belt;Rolling walker (2 wheels)      Activity Tolerance Patient tolerated treatment well   Patient Left in bed;with call bell/phone within reach;with bed alarm set   Nurse Communication Mobility status;Precautions        Time: 1345-1405 OT Time Calculation (min): 20 min  Charges: OT General Charges $OT Visit: 1 Visit OT Treatments $Self Care/Home Management : 8-22 mins   Brynn, OTR/L  Acute Rehabilitation Services Office: [phone] .   Mateo Flow 12/21/2023, 2:25 PM

## 2023-12-21 NOTE — Plan of Care (Signed)
  Problem: Activity: Goal: Risk for activity intolerance will decrease Outcome: Progressing   Problem: Health Behavior/Discharge Planning: Goal: Ability to manage health-related needs will improve Outcome: Not Progressing   Problem: Nutrition: Goal: Adequate nutrition will be maintained Outcome: Not Progressing   Problem: Coping: Goal: Level of anxiety will decrease Outcome: Not Progressing   Problem: Safety: Goal: Ability to remain free from injury will improve Outcome: Not Progressing

## 2023-12-21 NOTE — Progress Notes (Signed)
 Cardiology Progress Note  Patient ID: Cynthia Kelley MRN: [id number] DOB: 1956/04/16 Date of Encounter: 12/21/2023 Primary Cardiologist: None  Subjective   Chief Complaint: Confusion   HPI: Blood pressure soft yesterday.  Metoprolol reduced.  A-fib heart rates 130s this morning.  Digoxin has been added.  ROS:  All other ROS reviewed and negative. Pertinent positives noted in the HPI.     Telemetry  Overnight telemetry shows A-fib up to 130, which I personally reviewed.    Physical Exam   Vitals:   12/20/23 2020 12/21/23 0400 12/21/23 0749 12/21/23 0823  BP: (!) 94/58 124/62 134/79 134/79  Pulse: 87 (!) 104 (!) 117 (!) 117  Resp: 16  18   Temp: 98.4 F (36.9 C) 98.3 F (36.8 C) 98.9 F (37.2 C)   TempSrc:  Oral Oral   SpO2: 91% 96% 97%   Weight:      Height:        Intake/Output Summary (Last 24 hours) at 12/21/2023 1048 Last data filed at 12/20/2023 2145 Gross per 24 hour  Intake 740.44 ml  Output 450 ml  Net 290.44 ml       12/16/2023   10:53 AM 12/08/2023    8:07 AM 05/29/2023   11:12 AM  Last 3 Weights  Weight (lbs) 135 lb 119 lb 118 lb  Weight (kg) 61.236 kg 53.978 kg 53.524 kg    Body mass index is 21.46 kg/m.  General: Well nourished, well developed, in no acute distress Head: Atraumatic, normal size  Eyes: PEERLA, EOMI  Neck: Supple, no JVD Endocrine: No thryomegaly Cardiac: Normal S1, S2; irregular rhythm, no murmurs Lungs: Clear to auscultation bilaterally, no wheezing, rhonchi or rales  Abd: Soft, nontender, no hepatomegaly  Ext: No edema, pulses 2+ Musculoskeletal: No deformities, BUE and BLE strength normal and equal Skin: Warm and dry, no rashes   Neuro: Alert, awake, oriented to person and place  Cardiac Studies  TTE 03/01/2023 SUMMARY  Recommend follow up transthoracic echocardiogram in every 1-2 years for routine surveillance of  moderate valvular heart disease. Change in cardiac exam or clinical status may warrant earlier  reassessment. 2020  ACC-AHA Guideline  Circ. vol 143, e72-e227.  Improved RVSP from prior study.  Normal LV size, wall thickness, wall motion and systolic function with ejection fraction 65-70%  Left ventricular diastolic function and atrial pressure are indeterminate due to  mitral annular  calcification  The right ventricle is normal in size and function.  The left atrium is severely dilated.  The right atrium is moderately dilated.  There is probably mild mitral stenosis due to heavy mitral annualr calcification. There is moderate  mitral regurgitation.  There is mild to moderate tricuspid regurgitation.  Mild pulmonary hypertension. Estimated right ventricular systolic pressure is 39 mmHg.  IVC size was normal. Right atrial pressure is estimated to be 8 mm Hg.  The aortic sinus is normal size.   Patient Profile  Cynthia Kelley is a 68 y.o. female with alcohol abuse, COPD, HFpEF, persistent atrial fibrillation/flutter admitted on 12/15/2023 with mechanical fall and left femoral neck fracture status post operative repair.  Cardiology consulted for A-fib with RVR management.   Assessment & Plan   # Atrial fibrillation/flutter with RVR -Known history of A-fib and flutter.  Here with hip fracture status postrepair.  Also with alcohol withdrawal. -Metoprolol reduced yesterday.  A-fib with RVR today.  Transition back to metoprolol to tartrate 50 mg twice daily. -Add digoxin 0.125 mg daily. -Continue Eliquis 5 mg twice daily. -  Plan is for rate control strategy and to be considered for outpatient cardioversion with her cardiologist in [facility].  For now would continue with rate control strategy.  # Left hip fracture status post repair # Anemia # Alcohol abuse # HFpEF -HFpEF is stable.  Lasix as needed.  Other issues are being managed by the medical team.      For questions or updates, please contact [facility] Please consult [email or web address] for contact info under        Signed, Gerri Spore T.  Flora Lipps, MD, [facility]  [facility]  12/21/2023 10:49 AM

## 2023-12-22 DIAGNOSIS — D649 Anemia, unspecified: Secondary | ICD-10-CM | POA: Diagnosis not present

## 2023-12-22 DIAGNOSIS — I5030 Unspecified diastolic (congestive) heart failure: Secondary | ICD-10-CM | POA: Diagnosis not present

## 2023-12-22 DIAGNOSIS — I480 Paroxysmal atrial fibrillation: Secondary | ICD-10-CM | POA: Diagnosis not present

## 2023-12-22 DIAGNOSIS — F1010 Alcohol abuse, uncomplicated: Secondary | ICD-10-CM | POA: Diagnosis not present

## 2023-12-22 DIAGNOSIS — S72002A Fracture of unspecified part of neck of left femur, initial encounter for closed fracture: Secondary | ICD-10-CM | POA: Diagnosis not present

## 2023-12-22 NOTE — Progress Notes (Signed)
 [facility] PROGRESS NOTE    Progress Note  Cynthia Kelley  ZOX:[id number] DOB: 08/31/1956 DOA: 12/15/2023 PCP: Anabel Halon, MD     Brief Narrative:   Cynthia Kelley is an 68 y.o. female past medical history significant for past medical history of alcohol abuse, atrial flutter, dementia, chronic diastolic dysfunction presented to [facility] on transfer to [facility] after fall she was found to have a left intertrochanteric femur fracture orthopedic surgery was consulted and now she status post surgical intervention.  Elicitation was complicated by acute blood loss, alcoholic withdrawals and A-fib with RVR.  She was having urinary retention so Foley had to be placed cardiology was consulted for A-fib with RVR rate improved with medication, but it was complicated with hypotension.  PT evaluated the patient recommended skilled nursing facility placement, but due to her hypotension she was observed overnight   Assessment/Plan:   Closed left femoral fracture (HCC) secondary to mechanical fall in the setting of inebriation. On admission patient was inebriated. Imaging showed left hip fracture. Postsurgical intramedullary nailing of the left intertrochanteric femur on 12/16/2023 Orthopedic surgery recommended apixaban DVT prophylaxis. Was complaining of left knee pain and x-ray shows severe osteoarthritis of the knee which will need to be followed up with orthopedic surgery as an outpatient. PT evaluated the patient, she will need skilled nursing facility placement.  Hypotension: Her blood pressure is improved.  Alcohol intoxication : Alcohol level 91 admission. She started withdrawing started on thiamine and folate. CIWA score is coming down.  Acute metabolic encephalopathy: She became agitated around 5 PM yesterday, she is probably sundowning. She was given Seroquel and she seems to be more calm this morning. Continue melatonin at night. She had to be placed in restraints  overnight.  Paroxysmal A-fib: Cardiology was consulted she was started on amiodarone which has now been discontinued. She is now on metoprolol and digoxin for rate control. Continue Eliquis for A-fib  Chronic diastolic dysfunction: Appears to be compensated. Furosemide was held. Continue beta-blocker and, diuretics can be resumed as an outpatient.  Hyponatremia: Now improved.  Hypokalemia: Repleted now improved.  Acute urinary retention: Try voiding trial.  Leukocytosis: Likely reactive.  Vitamin D deficiency: Continue vitamin supplementation.  Postoperative acute blood loss anemia/iron deficiency anemia: Hemoglobin is relatively stable. Iron studies were checked and she was given IV iron.  COPD: Continue inhalers.  Dementia: Continue delirium precautions, continue Remeron.  Primary lung cancer with metastases from lung to other sites: On chemotherapy at this point in time we will follow-up with [facility] as an outpatient.  Moderate protein caloric malnutrition: Noted.   DVT prophylaxis: eliquis Family Communication:husband Status is: Inpatient Remains inpatient appropriate because: A-fib with RVR and hypotension    Code Status:     Code Status Orders  (From admission, onward)           Start     Ordered   12/15/23 2310  Full code  Continuous       Question:  By:  Answer:  Consent: discussion documented in EHR   12/15/23 2311           Code Status History     Date Active Date Inactive Code Status Order ID Comments User Context   02/27/2022 0434 03/04/2022 2224 DNR [id number]  Frankey Shown, DO ED   03/04/2021 1217 03/06/2021 2037 DNR [id number]  Ulice Bold, NP Inpatient   03/03/2021 0351 03/04/2021 1217 Full Code [id number]  Frankey Shown, DO ED   01/12/2021  0981 01/15/2021 1607 Full Code [id number]  Zierle-Ghosh, [place] B, DO Inpatient         IV Access:   Peripheral IV   Procedures and diagnostic studies:   No  results found.   Medical Consultants:   None.   Subjective:    Cynthia Kelley pain is controlled.  Objective:    Vitals:   12/21/23 2255 12/22/23 0521 12/22/23 0722 12/22/23 0909  BP: 109/62 109/62 (!) 91/54   Pulse: 90  94 94  Resp:   16   Temp:   98.8 F (37.1 C)   TempSrc:   Oral   SpO2:   97%   Weight:      Height:       SpO2: 97 % O2 Flow Rate (L/min): 6 L/min FiO2 (%): 21 %   Intake/Output Summary (Last 24 hours) at 12/22/2023 0922 Last data filed at 12/22/2023 0757 Gross per 24 hour  Intake 360 ml  Output --  Net 360 ml   Filed Weights   12/16/23 1053  Weight: 61.2 kg    Exam: General exam: In no acute distress. Respiratory system: Good air movement and clear to auscultation. Cardiovascular system: S1 & S2 heard, RRR. No JVD. Gastrointestinal system: Abdomen is nondistended, soft and nontender.  Extremities: No pedal edema. Skin: No rashes, lesions or ulcers Psychiatry: Judgement and insight appear normal. Mood & affect appropriate.  Data Reviewed:    Labs: Basic Metabolic Panel: Recent Labs  Lab 12/17/23 0432 12/18/23 0543 12/19/23 0822 12/20/23 0637 12/21/23 0617  NA 133* 138 138 137 136  K 4.2 3.3* 4.9 4.4 4.3  CL 106 104 106 106 104  CO2 22 24 25 27 26   GLUCOSE 146* 91 111* 103* 121*  BUN 13 9 18 22 20   CREATININE 0.68 0.61 0.80 0.88 0.71  CALCIUM 8.5* 9.3 8.9 8.5* 8.5*  MG 1.8 1.8 1.9 1.8 2.0  PHOS 3.9 2.7 3.8 4.5 3.3   GFR Estimated Creatinine Clearance: 63.4 mL/min (by C-G formula based on SCr of 0.71 mg/dL). Liver Function Tests: Recent Labs  Lab 12/17/23 0432 12/18/23 0543 12/19/23 0822 12/20/23 0637 12/21/23 0617  AST 29 36 25 25 28   ALT 19 18 15 15 17   ALKPHOS 59 58 66 76 91  BILITOT 0.9 1.3* 0.9 1.0 1.3*  PROT 5.8* 6.3* 5.8* 6.0* 6.1*  ALBUMIN 3.2* 3.5 2.9* 2.9* 2.9*   No results for input(s): "LIPASE", "AMYLASE" in the last 168 hours. No results for input(s): "AMMONIA" in the last 168 hours. Coagulation  profile Recent Labs  Lab 12/15/23 2107  INR 1.0   COVID-19 Labs  No results for input(s): "DDIMER", "FERRITIN", "LDH", "CRP" in the last 72 hours.  Lab Results  Component Value Date   SARSCOV2NAA NEGATIVE 09/04/2022   SARSCOV2NAA NEGATIVE 03/03/2021   SARSCOV2NAA NEGATIVE 01/12/2021    CBC: Recent Labs  Lab 12/15/23 1847 12/16/23 0554 12/17/23 0432 12/18/23 0543 12/19/23 0822 12/20/23 0637 12/21/23 0617  WBC 8.3   < > 11.1* 7.9 9.2 9.7 8.6  NEUTROABS 5.7  --   --  5.6 6.6 7.1 6.4  HGB 13.7   < > 9.6* 9.3* 8.8* 8.7* 8.5*  HCT 41.0   < > 29.2* 28.3* 27.0* 26.5* 25.7*  MCV 98.3   < > 98.3 98.3 100.4* 100.0 98.8  PLT 296   < > 236 223 253 265 294   < > = values in this interval not displayed.   Cardiac Enzymes: No results for input(s): "CKTOTAL", "CKMB", "  CKMBINDEX", "TROPONINI" in the last 168 hours. BNP (last 3 results) No results for input(s): "PROBNP" in the last 8760 hours. CBG: No results for input(s): "GLUCAP" in the last 168 hours. D-Dimer: No results for input(s): "DDIMER" in the last 72 hours. Hgb A1c: No results for input(s): "HGBA1C" in the last 72 hours. Lipid Profile: No results for input(s): "CHOL", "HDL", "LDLCALC", "TRIG", "CHOLHDL", "LDLDIRECT" in the last 72 hours. Thyroid function studies: No results for input(s): "TSH", "T4TOTAL", "T3FREE", "THYROIDAB" in the last 72 hours.  Invalid input(s): "FREET3" Anemia work up: No results for input(s): "VITAMINB12", "FOLATE", "FERRITIN", "TIBC", "IRON", "RETICCTPCT" in the last 72 hours. Sepsis Labs: Recent Labs  Lab 12/18/23 0543 12/19/23 2130 12/20/23 0637 12/21/23 0617  WBC 7.9 9.2 9.7 8.6   Microbiology No results found for this or any previous visit (from the past 240 hours).   Medications:    acetaminophen  650 mg Oral Q6H   Or   acetaminophen  650 mg Rectal Q6H   apixaban  5 mg Oral BID   Chlorhexidine Gluconate Cloth  6 each Topical Daily   cholecalciferol  2,000 Units Oral Daily    digoxin  0.125 mg Oral Daily   docusate sodium  100 mg Oral BID   folic acid  1 mg Oral Daily   iron polysaccharides  150 mg Oral Daily   melatonin  3 mg Oral QHS   metoprolol tartrate  50 mg Oral BID   mirabegron ER  50 mg Oral Daily   mirtazapine  7.5 mg Oral QHS   multivitamin with minerals  1 tablet Oral Daily   polyethylene glycol  17 g Oral BID   thiamine  100 mg Oral Daily   Continuous Infusions:    LOS: 7 days   Marinda Elk  [facility]  12/22/2023, 9:22 AM

## 2023-12-22 NOTE — Progress Notes (Signed)
 Cardiology Progress Note  Patient ID: Cynthia Kelley MRN: [id number] DOB: 1955/10/04 Date of Encounter: 12/22/2023 Primary Cardiologist: None  Subjective   Chief Complaint: None.   HPI: More confused.  Now with sitter in the room.  Heart rate seems to be controlled.  ROS:  All other ROS reviewed and negative. Pertinent positives noted in the HPI.     Physical Exam   Vitals:   12/21/23 2255 12/22/23 0521 12/22/23 0722 12/22/23 0909  BP: 109/62 109/62 (!) 91/54   Pulse: 90  94 94  Resp:   16   Temp:   98.8 F (37.1 C)   TempSrc:   Oral   SpO2:   97%   Weight:      Height:        Intake/Output Summary (Last 24 hours) at 12/22/2023 1006 Last data filed at 12/22/2023 0757 Gross per 24 hour  Intake 360 ml  Output --  Net 360 ml       12/16/2023   10:53 AM 12/08/2023    8:07 AM 05/29/2023   11:12 AM  Last 3 Weights  Weight (lbs) 135 lb 119 lb 118 lb  Weight (kg) 61.236 kg 53.978 kg 53.524 kg    Body mass index is 21.46 kg/m.  General: Well nourished, well developed, in no acute distress Head: Atraumatic, normal size  Eyes: PEERLA, EOMI  Neck: Supple, no JVD Endocrine: No thryomegaly Cardiac: Normal S1, S2; irregular rhythm, no murmurs rubs or gallops Lungs: Clear to auscultation bilaterally, no wheezing, rhonchi or rales  Abd: Soft, nontender, no hepatomegaly  Ext: No edema, pulses 2+ Musculoskeletal: No deformities, BUE and BLE strength normal and equal Skin: Warm and dry, no rashes   Neuro: Alert, awake, oriented to person and place  Cardiac Studies  TTE 03/01/2023 SUMMARY  Recommend follow up transthoracic echocardiogram in every 1-2 years for routine surveillance of  moderate valvular heart disease. Change in cardiac exam or clinical status may warrant earlier  reassessment. 2020 ACC-AHA Guideline  Circ. vol 143, e72-e227.  Improved RVSP from prior study.  Normal LV size, wall thickness, wall motion and systolic function with ejection fraction 65-70%  Left  ventricular diastolic function and atrial pressure are indeterminate due to  mitral annular  calcification  The right ventricle is normal in size and function.  The left atrium is severely dilated.  The right atrium is moderately dilated.  There is probably mild mitral stenosis due to heavy mitral annualr calcification. There is moderate  mitral regurgitation.  There is mild to moderate tricuspid regurgitation.  Mild pulmonary hypertension. Estimated right ventricular systolic pressure is 39 mmHg.  IVC size was normal. Right atrial pressure is estimated to be 8 mm Hg.  The aortic sinus is normal size.   Patient Profile  Cynthia Kelley is a 68 y.o. female with alcohol abuse, COPD, HFpEF, persistent atrial fibrillation/flutter admitted on 12/15/2023 with mechanical fall and left femoral neck fracture status post operative repair. Cardiology consulted for A-fib with RVR management.   Assessment & Plan   # Atrial fibrillation/flutter with RVR -Known history of atrial fibrillation and atrial flutter.  Follows with [facility] cardiology in [facility].  Currently admitted with hip fracture status post repair and alcohol withdrawal. -Plan is for rate control strategy.  Consider outpatient cardioversion but she will need to improve from medical standpoint. -Continue metoprolol tartrate 50 mg twice daily.  Continue digoxin 0.125 mg daily.  Can titrate up metoprolol as you are able for better rate control but would  allow for lenient rate control.  As long as her heart rate is less than 110 while sitting I am okay with this.  She is quite confused and going through alcohol withdrawal. -Continue Eliquis 5 mg twice daily.  # Left hip fracture status post repair # Anemia # Alcohol abuse/withdrawal # HFpEF -Per hospital team.  Lasix as needed for HFpEF.  No signs of volume overload.  [facility] will sign off.   Medication Recommendations: Medications as above. Other recommendations (labs, testing,  etc): None Follow up as an outpatient: She should follow-up with her outpatient cardiologist in [facility] at discharge.  Please call cardiology with questions.  For questions or updates, please contact [facility] Please consult [email or web address] for contact info under     Signed, Gerri Spore T. Flora Lipps, MD, [facility] [place]  [facility]  12/22/2023 10:06 AM

## 2023-12-22 NOTE — Plan of Care (Signed)

## 2023-12-22 NOTE — Plan of Care (Signed)

## 2023-12-22 NOTE — Progress Notes (Signed)
  Hypotension A-fib RVR-resolved RN reported that patient's blood pressure is soft 95/55 and heart rate 78. Per chart review patient has A-fib RVR which has been resolved currently on Lopressor 50 mg twice daily and digoxin 0.125 mg daily.   Asked the nurse to hold he Lopressor for tonight in the setting of soft blood pressure.  Continue digoxin.  Tereasa Coop, MD [facility] 12/22/2023, 9:53 PM

## 2023-12-22 NOTE — Progress Notes (Signed)
 Physical Therapy Treatment Patient Details Name: Cynthia Kelley MRN: [id number] DOB: [birth year] Today's Date: 12/22/2023   History of Present Illness 68 y.o. female presented to the ED 3/21 with complaints of a fall on the tile floor in her bathroom today. Pelvic x-ray shows left femoral intertrochanteric fracture with severe varus angulation. Pt is now s/p IM nail on 3/21. PMH alcohol abuse, atrial flutter, dementia with behavorial disturbance, COPD, diastolic CHF, Lung CA with mets to lung, L knee surgeries.    PT Comments  The pt was agreeable to session and participated well. She was able to progress to ambulating up to ~40 ft with a RW and min-modA today. However, she displays a tendency to flex at her trunk and L knee while plantarflexing her L ankle and reducing weight shifting to her L lower extremity, seemingly due to L hip pain when ambulating. She needs repeated, simple, multi-modal cues and assistance to correct her gait deviations momentarily before she would revert back. She required maxA to transition supine <> sit EOB but only minA to transfer to stand today. She remains at high risk for falls. Will continue to follow acutely.   If plan is discharge home, recommend the following: A lot of help with bathing/dressing/bathroom;Assistance with cooking/housework;Direct supervision/assist for medications management;Assist for transportation;Help with stairs or ramp for entrance;Supervision due to cognitive status;Two people to help with walking and/or transfers;Direct supervision/assist for financial management   Can travel by private vehicle     No  Equipment Recommendations  Rolling walker (2 wheels);Wheelchair (measurements PT);Wheelchair cushion (measurements PT);Hospital bed;BSC/3in1    Recommendations for Other Services       Precautions / Restrictions Precautions Precautions: Fall Recall of Precautions/Restrictions: Impaired Precaution/Restrictions Comments: Watch HR, posey  belt Restrictions Weight Bearing Restrictions Per Provider Order: Yes LLE Weight Bearing Per Provider Order: Weight bearing as tolerated     Mobility  Bed Mobility Overal bed mobility: Needs Assistance Bed Mobility: Supine to Sit, Sit to Supine     Supine to sit: HOB elevated, Max assist Sit to supine: Max assist, HOB elevated   General bed mobility comments: Pt cued to bring each leg off R EOB before ascending trunk to sit up. Pt would assist in moving legs to R but then return them to L when trying to move contralateral leg. MaxA needed to complete transition of legs and trunk to sit up R EOB. MaxA needed to lift legs and direct trunk to supine.    Transfers Overall transfer level: Needs assistance Equipment used: Rolling walker (2 wheels) Transfers: Sit to/from Stand Sit to Stand: Min assist           General transfer comment: Good initiation noted by pt pulling up on RW to stand from EOB, needing minA to power up to stand and gain balance due to noted trunk flexion and reduced L leg weight bearing likely due to pain    Ambulation/Gait Ambulation/Gait assistance: Min assist, Mod assist Gait Distance (Feet): 40 Feet Assistive device: Rolling walker (2 wheels) Gait Pattern/deviations: Step-to pattern, Decreased step length - right, Decreased stance time - left, Decreased stride length, Decreased dorsiflexion - left, Decreased weight shift to left, Knee flexed in stance - left, Trunk flexed, Narrow base of support Gait velocity: reduced Gait velocity interpretation: <1.31 ft/sec, indicative of household ambulator   General Gait Details: Pt ambulates with L knee flexed, L ankle plantarflexed, and L leg internally rotated. She ambulates with her trunk flexed and with reduced weight shift to L leg and  thus reduced R step length. Verbal, visual, and tactile cues provided to flatten L foot and point L toes forward to reduce internal rotation. Cues also provided to step-through with  R foot, but poor success and carryover noted. Min-modA needed for balance and RW guidance.   Stairs             Wheelchair Mobility     Tilt Bed    Modified Rankin (Stroke Patients Only)       Balance Overall balance assessment: Needs assistance Sitting-balance support: Bilateral upper extremity supported, Feet supported Sitting balance-Leahy Scale: Poor Sitting balance - Comments: Pt able to sit EOB statically with CGA for safety   Standing balance support: Bilateral upper extremity supported, During functional activity, Reliant on assistive device for balance Standing balance-Leahy Scale: Poor Standing balance comment: Reliant on RW and up to [employer] Communication: No apparent difficulties  Cognition Arousal: Alert Behavior During Therapy: WFL for tasks assessed/performed   PT - Cognitive impairments: History of cognitive impairments, Orientation, Awareness, Memory, Attention, Initiation, Sequencing, Problem solving, Safety/Judgement   Orientation impairments: Place (did not ask situation and time)                   PT - Cognition Comments: Hx of dementia. Pt agreeable to session and participatory today. Slow to process cues and benefits from multi-modal simple cues. Difficulty comprehending cues to correct feet placement or positioning. Following commands: Impaired Following commands impaired: Follows one step commands inconsistently, Follows one step commands with increased time    Cueing Cueing Techniques: Verbal cues, Visual cues, Gestural cues, Tactile cues  Exercises      General Comments General comments (skin integrity, edema, etc.): educated husband on guiding pt through SLR, hip abd/add, and heel slides as able, he verbalized understanding      Pertinent Vitals/Pain Pain Assessment Pain Assessment: Faces Faces Pain Scale: Hurts even more Pain Location: L hip Pain Descriptors /  Indicators: Discomfort, Grimacing, Operative site guarding, Moaning Pain Intervention(s): Limited activity within patient's tolerance, Monitored during session, Repositioned    Home Living                          Prior Function            PT Goals (current goals can now be found in the care plan section) Acute Rehab PT Goals Patient Stated Goal: Per spouse for her to be more stable and moving better prior to going home. PT Goal Formulation: With patient/family Time For Goal Achievement: 12/31/23 Potential to Achieve Goals: Fair Progress towards PT goals: Progressing toward goals    Frequency    Min 2X/week      PT Plan      Co-evaluation              AM-PAC PT "6 Clicks" Mobility   Outcome Measure  Help needed turning from your back to your side while in a flat bed without using bedrails?: A Little Help needed moving from lying on your back to sitting on the side of a flat bed without using bedrails?: A Lot Help needed moving to and from a bed to a chair (including a wheelchair)?: A Little Help needed standing up from a chair using your arms (e.g., wheelchair or bedside chair)?: A Little Help  needed to walk in hospital room?: A Lot Help needed climbing 3-5 steps with a railing? : Total 6 Click Score: 14    End of Session Equipment Utilized During Treatment: Gait belt Activity Tolerance: Patient tolerated treatment well Patient left: with call bell/phone within reach;in bed;with bed alarm set;with family/visitor present;with restraints reapplied   PT Visit Diagnosis: Other abnormalities of gait and mobility (R26.89);Unsteadiness on feet (R26.81);Muscle weakness (generalized) (M62.81);Difficulty in walking, not elsewhere classified (R26.2);Pain Pain - Right/Left: Left Pain - part of body: Hip     Time: 1610-9604 PT Time Calculation (min) (ACUTE ONLY): 23 min  Charges:    $Gait Training: 8-22 mins $Therapeutic Activity: 8-22 mins PT General  Charges $$ ACUTE PT VISIT: 1 Visit                     Virgil Benedict, PT, DPT Acute Rehabilitation Services  Office: [phone]    Cynthia Kelley 12/22/2023, 2:25 PM

## 2023-12-23 DIAGNOSIS — I4891 Unspecified atrial fibrillation: Secondary | ICD-10-CM

## 2023-12-23 DIAGNOSIS — S72002A Fracture of unspecified part of neck of left femur, initial encounter for closed fracture: Secondary | ICD-10-CM | POA: Diagnosis not present

## 2023-12-23 DIAGNOSIS — F10920 Alcohol use, unspecified with intoxication, uncomplicated: Secondary | ICD-10-CM | POA: Diagnosis not present

## 2023-12-23 DIAGNOSIS — F03918 Unspecified dementia, unspecified severity, with other behavioral disturbance: Secondary | ICD-10-CM | POA: Diagnosis not present

## 2023-12-23 MED ORDER — DOCUSATE SODIUM 100 MG PO CAPS: 100.0000 mg | ORAL_CAPSULE | Freq: Two times a day (BID) | ORAL | 0 refills | Status: DC

## 2023-12-23 MED ORDER — POLYETHYLENE GLYCOL 3350 17 G PO PACK
17.0000 g | PACK | Freq: Two times a day (BID) | ORAL | Status: DC
  Administered 2023-12-23 – 2023-12-25 (×2): 17 g via ORAL
  Filled 2023-12-23 (×4): qty 1

## 2023-12-23 MED ORDER — DIGOXIN 125 MCG PO TABS
0.1250 mg | ORAL_TABLET | Freq: Every day | ORAL | Status: DC
Start: 2023-12-23 — End: 2024-05-01

## 2023-12-23 MED ORDER — METOPROLOL SUCCINATE ER 25 MG PO TB24: 50.0000 mg | ORAL_TABLET | Freq: Every day | ORAL | Status: DC

## 2023-12-23 NOTE — Progress Notes (Signed)
 Mobility Specialist Progress Note:    12/23/23 0900  Mobility  Activity Ambulated with assistance to bathroom  Level of Assistance Minimal assist, patient does 75% or more  Assistive Device Front wheel walker;Other (Comment) (HHA)  Distance Ambulated (ft) 40 ft  LLE Weight Bearing Per Provider Order WBAT  Activity Response Tolerated well  Mobility Referral Yes  Mobility visit 1 Mobility  Mobility Specialist Start Time (ACUTE ONLY) [id number]  Mobility Specialist Stop Time (ACUTE ONLY) 0931  Mobility Specialist Time Calculation (min) (ACUTE ONLY) 9 min   Pt received on EOB w/ alarm sounding. Requesting assistance to BR. Assisted to [facility] w/ HHA d/t impulsiveness. C/o some LLE pain during ambulation. Void successful. Ambulated back to bed w/ RW. Left in bed with call bell and all needs met. Bed alarm on.  D'Vante Earlene Plater Mobility Specialist Please contact via Special educational needs teacher or Rehab office at [phone]

## 2023-12-23 NOTE — Discharge Summary (Addendum)
 Physician Discharge Summary  Cynthia Kelley:[id number] DOB: 04/16/1956 DOA: 12/15/2023  PCP: Anabel Halon, MD  Admit date: 12/15/2023 Discharge date: 12/23/2023  Admitted From: Home Disposition:  SNF  Recommendations for Outpatient Follow-up:  Follow up with PCP in 1-2 weeks Please obtain BMP/CBC in one week   Home Health:No Equipment/Devices:None  Discharge Condition:Stable CODE STATUS:Full Diet recommendation: Heart Healthy   Brief/Interim Summary: 68 y.o. female past medical history significant for past medical history of alcohol abuse, atrial flutter, dementia, chronic diastolic dysfunction presented to [facility] on transfer to [facility] after fall she was found to have a left intertrochanteric femur fracture orthopedic surgery was consulted and now she status post surgical intervention.  Elicitation was complicated by acute blood loss, alcoholic withdrawals and A-fib with RVR.  She was having urinary retention so Foley had to be placed cardiology was consulted for A-fib with RVR rate improved with medication, but it was complicated with hypotension.  PT evaluated the patient recommended skilled nursing facility placement, but due to her hypotension she was observed overnight   Discharge Diagnoses:  Principal Problem:   Closed left femoral fracture (HCC) Active Problems:   Alcohol intoxication (HCC)   Lung cancer, primary, with metastasis from lung to other site [facility])   Chronic diastolic CHF (congestive heart failure) (HCC)   Dementia with behavioral disturbance (HCC)   Paroxysmal atrial fibrillation (HCC)   Atrial fibrillation with RVR (HCC)  Close left femoral hip fracture secondary to mechanical fall in the setting of inebriation: Imaging showed left hip fracture orthopedic surgery was consulted and she status post intramedullary nailing of the left intertrochanteric femur on 12/16/2023. Orthopedic agree with apixaban for DVT prophylaxis. Was complaining of left knee  pain x-ray was done that showed severe osteoarthritis she will need to follow-up with orthopedics as an outpatient. PT evaluated the patient she will go to skilled nursing facility.  Hypotension: Unclear etiology, but she is currently on metoprolol 50 mg and her blood pressure seems to be stable.  Alcohol intoxication, then alcohol withdrawal: On admission alcohol level slightly at 91. She started on thiamine and folate. She started withdrawal she was started on CIWA protocol and she completed her course in house.  Acute metabolic encephalopathy superimposed on dementia without behavioral disturbances: She started sundowning 2 days prior to discharge she was tried on Seroquel but continued to be agitated getting out of bed she had to be placed on restraints which were discontinued. Melatonin was added. And after this she remained calm. She had no restraints overnight day prior to discharge.  Paroxysmal A-fib: As she was hard to control she was on amiodarone which was eventually discontinued. Her metoprolol was increased and digoxin was added. Heart rate remained controlled. Continue Eliquis no changes made to her Eliquis.  Chronic diastolic dysfunction: Appears to be compensated continue beta-blocker and can resume diuretics as an outpatient.  Hyponatremia: Now resolved.  Hypokalemia: Repleted now improved.  Acute urinary retention: Foley was placed.  Voiding trial was given and Foley has been removed.  Leukocytosis: Likely reactive.     She was started on vitamin D supplementation.    Discharge Instructions  Discharge Instructions     Diet - low sodium heart healthy   Complete by: As directed    Increase activity slowly   Complete by: As directed    No wound care   Complete by: As directed       Allergies as of 12/23/2023   No Known Allergies  Medication List     STOP taking these medications    amiodarone 200 MG tablet Commonly known as:  PACERONE       TAKE these medications    acetaminophen 500 MG tablet Commonly known as: TYLENOL Take 1,000 mg by mouth every 6 (six) hours as needed for mild pain (pain score 1-3).   apixaban 5 MG Tabs tablet Commonly known as: ELIQUIS Take 5 mg by mouth 2 (two) times daily.   digoxin 0.125 MG tablet Commonly known as: LANOXIN Take 1 tablet (0.125 mg total) by mouth daily.   docusate sodium 100 MG capsule Commonly known as: COLACE Take 1 capsule (100 mg total) by mouth 2 (two) times daily.   furosemide 20 MG tablet Commonly known as: LASIX Take 1 tablet (20 mg total) by mouth daily.   methocarbamol 500 MG tablet Commonly known as: ROBAXIN Take 1 tablet (500 mg total) by mouth every 6 (six) hours as needed for muscle spasms.   metoprolol succinate 25 MG 24 hr tablet Commonly known as: TOPROL-XL Take 2 tablets (50 mg total) by mouth daily. What changed: how much to take   mirabegron ER 50 MG Tb24 tablet Commonly known as: Myrbetriq Take 1 tablet (50 mg total) by mouth daily.   mirtazapine 7.5 MG tablet Commonly known as: REMERON Take 1 tablet (7.5 mg total) by mouth at bedtime.   multivitamin with minerals tablet Take 1 tablet by mouth daily.   oxyCODONE 5 MG immediate release tablet Commonly known as: Oxy IR/ROXICODONE Take 1 tablet (5 mg total) by mouth every 4 (four) hours as needed for severe pain (pain score 7-10).   vitamin D3 25 MCG tablet Commonly known as: CHOLECALCIFEROL Take 2 tablets (2,000 Units total) by mouth daily.        Contact information for follow-up providers     Haddix, Gillie Manners, MD. Schedule an appointment as soon as possible for a visit in 2 week(s).   Specialty: Orthopedic Surgery Why: for wound check and repeat x-rays Contact information: [address] [place] [address] [phone]              Contact information for after-discharge care     Destination     [facility] .   Service: Skilled  Nursing Contact information: [address] [place] [address] [phone]                    No Known Allergies  Consultations: Orthopedic surgery   Procedures/Studies: DG Knee Left Port Result Date: 12/17/2023 CLINICAL DATA:  [id number] Left knee pain 242330 EXAM: PORTABLE LEFT KNEE - 1-2 VIEW COMPARISON:  X-ray left femur 12/16/2023 FINDINGS: No evidence of fracture, dislocation, or joint effusion. Severe tricompartmental degenerative changes of the knee. Intramedullary nail fixation of the distal femur partially visualized. Plate and screw fixation of the tibia metadiaphysis. Soft tissues are unremarkable. IMPRESSION: 1.  Severe tricompartmental degenerative changes of the knee. 2.  No acute displaced fracture or dislocation. Electronically Signed   By: Tish Frederickson M.D.   On: 12/17/2023 22:32   DG FEMUR PORT MIN 2 VIEWS LEFT Result Date: 12/16/2023 CLINICAL DATA:  Femur fracture, postop. EXAM: LEFT FEMUR PORTABLE 2 VIEWS COMPARISON:  Preoperative imaging FINDINGS: Femoral intramedullary nail with trans trochanteric and distal locking screw fixation traverse intertrochanteric femur fracture. Improved fracture alignment from preoperative imaging. The bones are diffusely under mineralized. Questionable lucent lesion in the proximal femoral shaft, fixated by femoral nail. Recent postsurgical change  includes air and edema in the soft tissues. IMPRESSION: 1. ORIF of intertrochanteric femur fracture with improved alignment. 2. Questionable lucent lesion in the proximal femoral shaft, fixated by femoral nail. Recommend attention on follow-up. Electronically Signed   By: Narda Rutherford M.D.   On: 12/16/2023 14:54   DG FEMUR MIN 2 VIEWS LEFT Result Date: 12/16/2023 CLINICAL DATA:  Elective surgery. EXAM: LEFT FEMUR 2 VIEWS COMPARISON:  Preoperative imaging FINDINGS: Seven fluoroscopic spot views of the left femur submitted from the operating room. Femoral intramedullary  nail with trans trochanteric and distal locking screw fixation traverse proximal femur fracture. Fluoroscopy time 1 minutes 47 seconds. Dose 9.94 mGy. IMPRESSION: Intraoperative fluoroscopy during proximal femur fracture fixation. Electronically Signed   By: Narda Rutherford M.D.   On: 12/16/2023 14:52   DG C-Arm 1-60 Min-No Report Result Date: 12/16/2023 Fluoroscopy was utilized by the requesting physician.  No radiographic interpretation.   CT Head Wo Contrast Result Date: 12/15/2023 CLINICAL DATA:  Larey Seat EXAM: CT HEAD WITHOUT CONTRAST TECHNIQUE: Contiguous axial images were obtained from the base of the skull through the vertex without intravenous contrast. RADIATION DOSE REDUCTION: This exam was performed according to the departmental dose-optimization program which includes automated exposure control, adjustment of the mA and/or kV according to patient size and/or use of iterative reconstruction technique. COMPARISON:  02/27/2022 FINDINGS: Brain: Stable scattered hypodensities throughout the periventricular white matter consistent with chronic small vessel ischemic changes. No evidence of acute infarct or hemorrhage. The lateral ventricles and midline structures are unremarkable. No acute extra-axial fluid collections. No mass effect. Vascular: No hyperdense vessel or unexpected calcification. Skull: Normal. Negative for fracture or focal lesion. Sinuses/Orbits: No acute finding. Other: None. IMPRESSION: 1. Stable head CT, no acute intracranial process. Electronically Signed   By: Sharlet Salina M.D.   On: 12/15/2023 21:34   DG Shoulder Right Result Date: 12/15/2023 CLINICAL DATA:  Fall, pain EXAM: RIGHT SHOULDER - 2+ VIEW COMPARISON:  None Available. FINDINGS: There is a left humeral neck fracture with marked angulation and displacement. No definite dislocation. AC joint is intact. IMPRESSION: Markedly angulated and displaced right humeral neck fracture. Electronically Signed   By: Charlett Nose M.D.    On: 12/15/2023 19:24   DG Chest 1 View Result Date: 12/15/2023 CLINICAL DATA:  Fall, left hip fracture EXAM: CHEST  1 VIEW COMPARISON:  09/04/2022 FINDINGS: Right Port-A-Cath remains in place, unchanged. Heart and mediastinal contours are within normal limits. Aortic atherosclerosis. No confluent airspace opacities, effusions or edema. No acute bony abnormality. Old healed left rib fracture. IMPRESSION: No active disease. Electronically Signed   By: Charlett Nose M.D.   On: 12/15/2023 19:23   DG Hip Unilat W or Wo Pelvis 2-3 Views Left Result Date: 12/15/2023 CLINICAL DATA:  Fall.  Left hip pain.  Left leg shortening. EXAM: DG HIP (WITH OR WITHOUT PELVIS) 2-3V LEFT COMPARISON:  None Available. FINDINGS: There is a left femoral intertrochanteric fracture with severe angulation. No subluxation or dislocation. Hip joints and SI joints symmetric and unremarkable. IMPRESSION: Left femoral intertrochanteric fracture with severe varus angulation. Electronically Signed   By: Charlett Nose M.D.   On: 12/15/2023 19:21   Subjective: No complaints  Discharge Exam: Vitals:   12/23/23 0421 12/23/23 0700  BP: 123/76 130/69  Pulse: 88 79  Resp: 18 17  Temp: 98.3 F (36.8 C) 98.3 F (36.8 C)  SpO2: 100% 99%   Vitals:   12/22/23 1946 12/22/23 2157 12/23/23 0421 12/23/23 0700  BP: (!) 97/55 Marland Kitchen)  97/55 123/76 130/69  Pulse: 78 78 88 79  Resp: 17  18 17   Temp: 98.5 F (36.9 C)  98.3 F (36.8 C) 98.3 F (36.8 C)  TempSrc: Oral  Oral Oral  SpO2: 98%  100% 99%  Weight:      Height:        General: Pt is alert, awake, not in acute distress Cardiovascular: RRR, S1/S2 +, no rubs, no gallops Respiratory: CTA bilaterally, no wheezing, no rhonchi Abdominal: Soft, NT, ND, bowel sounds + Extremities: no edema, no cyanosis    The results of significant diagnostics from this hospitalization (including imaging, microbiology, ancillary and laboratory) are listed below for reference.     Microbiology: No  results found for this or any previous visit (from the past 240 hours).   Labs: BNP (last 3 results) Recent Labs    05/29/23 1135  BNP 942.0*   Basic Metabolic Panel: Recent Labs  Lab 12/17/23 0432 12/18/23 0543 12/19/23 0822 12/20/23 0637 12/21/23 0617  NA 133* 138 138 137 136  K 4.2 3.3* 4.9 4.4 4.3  CL 106 104 106 106 104  CO2 22 24 25 27 26   GLUCOSE 146* 91 111* 103* 121*  BUN 13 9 18 22 20   CREATININE 0.68 0.61 0.80 0.88 0.71  CALCIUM 8.5* 9.3 8.9 8.5* 8.5*  MG 1.8 1.8 1.9 1.8 2.0  PHOS 3.9 2.7 3.8 4.5 3.3   Liver Function Tests: Recent Labs  Lab 12/17/23 0432 12/18/23 0543 12/19/23 0822 12/20/23 0637 12/21/23 0617  AST 29 36 25 25 28   ALT 19 18 15 15 17   ALKPHOS 59 58 66 76 91  BILITOT 0.9 1.3* 0.9 1.0 1.3*  PROT 5.8* 6.3* 5.8* 6.0* 6.1*  ALBUMIN 3.2* 3.5 2.9* 2.9* 2.9*   No results for input(s): "LIPASE", "AMYLASE" in the last 168 hours. No results for input(s): "AMMONIA" in the last 168 hours. CBC: Recent Labs  Lab 12/17/23 0432 12/18/23 0543 12/19/23 0822 12/20/23 0637 12/21/23 0617  WBC 11.1* 7.9 9.2 9.7 8.6  NEUTROABS  --  5.6 6.6 7.1 6.4  HGB 9.6* 9.3* 8.8* 8.7* 8.5*  HCT 29.2* 28.3* 27.0* 26.5* 25.7*  MCV 98.3 98.3 100.4* 100.0 98.8  PLT 236 223 253 265 294   Cardiac Enzymes: No results for input(s): "CKTOTAL", "CKMB", "CKMBINDEX", "TROPONINI" in the last 168 hours. BNP: Invalid input(s): "POCBNP" CBG: No results for input(s): "GLUCAP" in the last 168 hours. D-Dimer No results for input(s): "DDIMER" in the last 72 hours. Hgb A1c No results for input(s): "HGBA1C" in the last 72 hours. Lipid Profile No results for input(s): "CHOL", "HDL", "LDLCALC", "TRIG", "CHOLHDL", "LDLDIRECT" in the last 72 hours. Thyroid function studies No results for input(s): "TSH", "T4TOTAL", "T3FREE", "THYROIDAB" in the last 72 hours.  Invalid input(s): "FREET3" Anemia work up No results for input(s): "VITAMINB12", "FOLATE", "FERRITIN", "TIBC", "IRON",  "RETICCTPCT" in the last 72 hours. Urinalysis    Component Value Date/Time   COLORURINE YELLOW 02/27/2022 0823   APPEARANCEUR CLEAR 02/27/2022 0823   LABSPEC 1.029 02/27/2022 0823   PHURINE 6.0 02/27/2022 0823   GLUCOSEU NEGATIVE 02/27/2022 0823   HGBUR NEGATIVE 02/27/2022 0823   BILIRUBINUR negative 03/19/2023 1052   BILIRUBINUR small 09/15/2017 1210   KETONESUR negative 03/19/2023 1052   KETONESUR NEGATIVE 02/27/2022 0823   PROTEINUR =100 (A) 03/19/2023 1052   PROTEINUR NEGATIVE 02/27/2022 0823   UROBILINOGEN 0.2 03/19/2023 1052   NITRITE Negative 03/19/2023 1052   NITRITE NEGATIVE 02/27/2022 0823   LEUKOCYTESUR Trace (A) 03/19/2023 1052  LEUKOCYTESUR NEGATIVE 02/27/2022 0823   Sepsis Labs Recent Labs  Lab 12/18/23 0543 12/19/23 0822 12/20/23 0637 12/21/23 0617  WBC 7.9 9.2 9.7 8.6   Microbiology No results found for this or any previous visit (from the past 240 hours).   Time coordinating discharge: Over 35 minutes  SIGNED:   Marinda Elk, MD  [facility] 12/23/2023, 8:55 AM Pager   If 7PM-7AM, please contact night-coverage [email or web address] Password [email or web address]

## 2023-12-23 NOTE — TOC Progression Note (Addendum)
 Transition of Care [facility]) - Progression Note    Patient Details  Name: Cynthia Kelley MRN: [id number] Date of Birth: Sep 14, 1956  Transition of Care [facility]) CM/SW Contact  Lorri Frederick, LCSW Phone Number: 12/23/2023, 1:12 PM  Clinical Narrative:    CSW informed by RN that pt restraints removed 1800 yesterday.   Message from Janie/[facility]: They need pt to be out of restraints x24 hours.  Also, no bed available today.  Possibly will have one Saturday.    1300: Husband updated in room of no DC today, possibly over the weekend.    Auth request submitted in [place].   Expected Discharge Plan: Skilled Nursing Facility Barriers to Discharge: Continued Medical Work up  Expected Discharge Plan and Services In-house Referral: Clinical Social Work     Living arrangements for the past 2 months: Single Family Home Expected Discharge Date: 12/23/23                                     Social Determinants of Health (SDOH) Interventions SDOH Screenings   Food Insecurity: No Food Insecurity (12/16/2023)  Housing: Low Risk  (12/16/2023)  Transportation Needs: No Transportation Needs (12/16/2023)  Utilities: Not At Risk (12/16/2023)  Alcohol Screen: Low Risk  (12/16/2022)  Depression (PHQ2-9): Low Risk  (12/08/2023)  Financial Resource Strain: Low Risk  (12/16/2022)  Physical Activity: Inactive (12/16/2022)  Social Connections: Moderately Integrated (12/16/2023)  Stress: No Stress Concern Present (12/16/2022)  Tobacco Use: High Risk (12/16/2023)    Readmission Risk Interventions    03/01/2022   11:11 AM  Readmission Risk Prevention Plan  Transportation Screening Complete  HRI or Home Care Consult Complete  Social Work Consult for Recovery Care Planning/Counseling Complete  Palliative Care Screening --  Medication Review Oceanographer) Complete

## 2023-12-24 DIAGNOSIS — S72002A Fracture of unspecified part of neck of left femur, initial encounter for closed fracture: Secondary | ICD-10-CM | POA: Diagnosis not present

## 2023-12-24 DIAGNOSIS — S72042A Displaced fracture of base of neck of left femur, initial encounter for closed fracture: Secondary | ICD-10-CM

## 2023-12-24 LAB — URINALYSIS, ROUTINE W REFLEX MICROSCOPIC
Glucose, UA: NEGATIVE mg/dL
Ketones, ur: NEGATIVE mg/dL
Nitrite: NEGATIVE
Protein, ur: NEGATIVE mg/dL
Specific Gravity, Urine: 1.023 (ref 1.005–1.030)
WBC, UA: >50 WBC/hpf (ref 0–5)
pH: 6 (ref 5.0–8.0)

## 2023-12-24 MED ORDER — TRAZODONE HCL 50 MG PO TABS
25.0000 mg | ORAL_TABLET | Freq: Once | ORAL | Status: AC
  Administered 2023-12-24: 25 mg via ORAL
  Filled 2023-12-24: qty 1

## 2023-12-24 MED ORDER — TRAZODONE HCL 50 MG PO TABS: 25.0000 mg | ORAL_TABLET | Freq: Every evening | ORAL | Status: DC | PRN

## 2023-12-24 MED ORDER — QUETIAPINE FUMARATE 25 MG PO TABS
25.0000 mg | ORAL_TABLET | Freq: Every evening | ORAL | Status: DC | PRN
  Administered 2023-12-24: 25 mg via ORAL
  Filled 2023-12-24: qty 1

## 2023-12-24 MED ORDER — QUETIAPINE FUMARATE 25 MG PO TABS
25.0000 mg | ORAL_TABLET | Freq: Once | ORAL | Status: AC
Start: 2023-12-24 — End: 2023-12-24
  Administered 2023-12-24: 25 mg via ORAL
  Filled 2023-12-24: qty 1

## 2023-12-24 MED ORDER — HYDROXYZINE HCL 25 MG PO TABS
25.0000 mg | ORAL_TABLET | Freq: Four times a day (QID) | ORAL | Status: AC | PRN
Start: 2023-12-24 — End: 2023-12-24
  Administered 2023-12-24: 25 mg via ORAL
  Filled 2023-12-24: qty 1

## 2023-12-24 NOTE — TOC Progression Note (Signed)
 Transition of Care [facility]) - Progression Note    Patient Details  Name: Jadah Bobak MRN: [id number] Date of Birth: [birth year]  Transition of Care [facility]) CM/SW Contact  Donnalee Curry, LCSWA Phone Number: 12/24/2023, 9:41 AM  Clinical Narrative:     SW spoke with Misty Stanley [facility] [phone]) auth approved 3/29-4/1 [id number] Plan Auth #[id number]  SW spoke with Wille Celeste [facility]) reports no bed until Monday    Expected Discharge Plan: Skilled Nursing Facility Barriers to Discharge: Continued Medical Work up  Expected Discharge Plan and Services In-house Referral: Clinical Social Work     Living arrangements for the past 2 months: Single Family Home Expected Discharge Date: 12/23/23                                     Social Determinants of Health (SDOH) Interventions SDOH Screenings   Food Insecurity: No Food Insecurity (12/16/2023)  Housing: Low Risk  (12/16/2023)  Transportation Needs: No Transportation Needs (12/16/2023)  Utilities: Not At Risk (12/16/2023)  Alcohol Screen: Low Risk  (12/16/2022)  Depression (PHQ2-9): Low Risk  (12/08/2023)  Financial Resource Strain: Low Risk  (12/16/2022)  Physical Activity: Inactive (12/16/2022)  Social Connections: Moderately Integrated (12/16/2023)  Stress: No Stress Concern Present (12/16/2022)  Tobacco Use: High Risk (12/16/2023)    Readmission Risk Interventions    03/01/2022   11:11 AM  Readmission Risk Prevention Plan  Transportation Screening Complete  HRI or Home Care Consult Complete  Social Work Consult for Recovery Care Planning/Counseling Complete  Palliative Care Screening --  Medication Review Oceanographer) Complete

## 2023-12-24 NOTE — Progress Notes (Signed)
 [facility] PROGRESS NOTE    Progress Note  Cynthia Kelley  VHQ:[id number] DOB: Jul 09, 1956 DOA: 12/15/2023 PCP: Anabel Halon, MD     Brief Narrative:   Cynthia Kelley is an 68 y.o. female past medical history significant for past medical history of alcohol abuse, atrial flutter, dementia, chronic diastolic dysfunction presented to [facility] on transfer to [facility] after fall she was found to have a left intertrochanteric femur fracture orthopedic surgery was consulted and now she status post surgical intervention.  Elicitation was complicated by acute blood loss, alcoholic withdrawals and A-fib with RVR.  She was having urinary retention so Foley had to be placed cardiology was consulted for A-fib with RVR rate improved with medication, but it was complicated with hypotension.  PT evaluated the patient recommended skilled nursing facility placement, but due to her hypotension she was observed overnight   Assessment/Plan:   Closed left femoral fracture (HCC) secondary to mechanical fall in the setting of inebriation. On admission patient was inebriated. Imaging showed left hip fracture. Postsurgical intramedullary nailing of the left intertrochanteric femur on 12/16/2023 Orthopedic surgery recommended apixaban DVT prophylaxis. PT evaluated the patient, she will need skilled nursing facility placement. Will need to be discharged on 12/26/2023.  Hypotension: Her blood pressure is improved.  Alcohol intoxication : Alcohol level 91 admission. She started withdrawing started on thiamine and folate. CIWA score is coming down.  Acute metabolic encephalopathy: She became agitated around 5 PM yesterday, she is probably sundowning. She was given Seroquel and she seems to be more calm this morning. Continue melatonin at night. She had to be placed in restraints overnight.  Paroxysmal A-fib: Cardiology was consulted she was started on amiodarone which has now been discontinued. She  is now on metoprolol and digoxin for rate control. Continue Eliquis for A-fib  Chronic diastolic dysfunction: Appears to be compensated. Furosemide was held. Continue beta-blocker and, diuretics can be resumed as an outpatient.  Hyponatremia: Now improved.  Hypokalemia: Repleted now improved.  Acute urinary retention: Try voiding trial.  Leukocytosis: Likely reactive.  Vitamin D deficiency: Continue vitamin supplementation.  Postoperative acute blood loss anemia/iron deficiency anemia: Hemoglobin is relatively stable. Iron studies were checked and she was given IV iron.  COPD: Continue inhalers.  Dementia: Continue delirium precautions, continue Remeron.  Primary lung cancer with metastases from lung to other sites: On chemotherapy at this point in time we will follow-up with [facility] as an outpatient.  Moderate protein caloric malnutrition: Noted.   DVT prophylaxis: eliquis Family Communication:husband Status is: Inpatient Remains inpatient appropriate because: A-fib with RVR and hypotension    Code Status:     Code Status Orders  (From admission, onward)           Start     Ordered   12/15/23 2310  Full code  Continuous       Question:  By:  Answer:  Consent: discussion documented in EHR   12/15/23 2311           Code Status History     Date Active Date Inactive Code Status Order ID Comments User Context   02/27/2022 0434 03/04/2022 2224 DNR [id number]  Frankey Shown, DO ED   03/04/2021 1217 03/06/2021 2037 DNR [id number]  Ulice Bold, NP Inpatient   03/03/2021 0351 03/04/2021 1217 Full Code [id number]  Frankey Shown, DO ED   01/12/2021 0748 01/15/2021 1607 Full Code [id number]  Zierle-Ghosh, [place], DO Inpatient  IV Access:   Peripheral IV   Procedures and diagnostic studies:   No results found.   Medical Consultants:   None.   Subjective:    Cynthia Kelley pain is controlled.  Objective:     Vitals:   12/23/23 1513 12/24/23 0000 12/24/23 0809 12/24/23 1008  BP: (!) 106/57 110/72 (!) 89/48 (!) 92/53  Pulse: 88 88 82 71  Resp: 18  16   Temp: 98 F (36.7 C)  98 F (36.7 C)   TempSrc: Oral     SpO2: 99%  99%   Weight:      Height:       SpO2: 99 % O2 Flow Rate (L/min): 6 L/min FiO2 (%): 21 %   Intake/Output Summary (Last 24 hours) at 12/24/2023 1040 Last data filed at 12/23/2023 1100 Gross per 24 hour  Intake 120 ml  Output --  Net 120 ml   Filed Weights   12/16/23 1053  Weight: 61.2 kg    Exam: General exam: In no acute distress. Respiratory system: Good air movement and clear to auscultation. Cardiovascular system: S1 & S2 heard, RRR. No JVD. Gastrointestinal system: Abdomen is nondistended, soft and nontender.  Extremities: No pedal edema. Skin: No rashes, lesions or ulcers Psychiatry: Judgement and insight appear normal. Mood & affect appropriate.  Data Reviewed:    Labs: Basic Metabolic Panel: Recent Labs  Lab 12/18/23 0543 12/19/23 0822 12/20/23 0637 12/21/23 0617  NA 138 138 137 136  K 3.3* 4.9 4.4 4.3  CL 104 106 106 104  CO2 24 25 27 26   GLUCOSE 91 111* 103* 121*  BUN 9 18 22 20   CREATININE 0.61 0.80 0.88 0.71  CALCIUM 9.3 8.9 8.5* 8.5*  MG 1.8 1.9 1.8 2.0  PHOS 2.7 3.8 4.5 3.3   GFR Estimated Creatinine Clearance: 63.4 mL/min (by C-G formula based on SCr of 0.71 mg/dL). Liver Function Tests: Recent Labs  Lab 12/18/23 0543 12/19/23 0822 12/20/23 0637 12/21/23 0617  AST 36 25 25 28   ALT 18 15 15 17   ALKPHOS 58 66 76 91  BILITOT 1.3* 0.9 1.0 1.3*  PROT 6.3* 5.8* 6.0* 6.1*  ALBUMIN 3.5 2.9* 2.9* 2.9*   No results for input(s): "LIPASE", "AMYLASE" in the last 168 hours. No results for input(s): "AMMONIA" in the last 168 hours. Coagulation profile No results for input(s): "INR", "PROTIME" in the last 168 hours.  COVID-19 Labs  No results for input(s): "DDIMER", "FERRITIN", "LDH", "CRP" in the last 72 hours.  Lab  Results  Component Value Date   SARSCOV2NAA NEGATIVE 09/04/2022   SARSCOV2NAA NEGATIVE 03/03/2021   SARSCOV2NAA NEGATIVE 01/12/2021    CBC: Recent Labs  Lab 12/18/23 0543 12/19/23 0822 12/20/23 0637 12/21/23 0617  WBC 7.9 9.2 9.7 8.6  NEUTROABS 5.6 6.6 7.1 6.4  HGB 9.3* 8.8* 8.7* 8.5*  HCT 28.3* 27.0* 26.5* 25.7*  MCV 98.3 100.4* 100.0 98.8  PLT 223 253 265 294   Cardiac Enzymes: No results for input(s): "CKTOTAL", "CKMB", "CKMBINDEX", "TROPONINI" in the last 168 hours. BNP (last 3 results) No results for input(s): "PROBNP" in the last 8760 hours. CBG: No results for input(s): "GLUCAP" in the last 168 hours. D-Dimer: No results for input(s): "DDIMER" in the last 72 hours. Hgb A1c: No results for input(s): "HGBA1C" in the last 72 hours. Lipid Profile: No results for input(s): "CHOL", "HDL", "LDLCALC", "TRIG", "CHOLHDL", "LDLDIRECT" in the last 72 hours. Thyroid function studies: No results for input(s): "TSH", "T4TOTAL", "T3FREE", "THYROIDAB" in the last 72 hours.  Invalid input(s): "FREET3" Anemia work up: No results for input(s): "VITAMINB12", "FOLATE", "FERRITIN", "TIBC", "IRON", "RETICCTPCT" in the last 72 hours. Sepsis Labs: Recent Labs  Lab 12/18/23 0543 12/19/23 1610 12/20/23 0637 12/21/23 0617  WBC 7.9 9.2 9.7 8.6   Microbiology No results found for this or any previous visit (from the past 240 hours).   Medications:    acetaminophen  650 mg Oral Q6H   Or   acetaminophen  650 mg Rectal Q6H   apixaban  5 mg Oral BID   Chlorhexidine Gluconate Cloth  6 each Topical Daily   cholecalciferol  2,000 Units Oral Daily   digoxin  0.125 mg Oral Daily   docusate sodium  100 mg Oral BID   folic acid  1 mg Oral Daily   iron polysaccharides  150 mg Oral Daily   melatonin  3 mg Oral QHS   metoprolol tartrate  50 mg Oral BID   mirabegron ER  50 mg Oral Daily   mirtazapine  7.5 mg Oral QHS   multivitamin with minerals  1 tablet Oral Daily   polyethylene  glycol  17 g Oral BID   thiamine  100 mg Oral Daily   Continuous Infusions:    LOS: 9 days   Marinda Elk  [facility]  12/24/2023, 10:40 AM

## 2023-12-24 NOTE — Plan of Care (Signed)
  Problem: Clinical Measurements: Goal: Cardiovascular complication will be avoided Outcome: Progressing   Problem: Activity: Goal: Risk for activity intolerance will decrease Outcome: Progressing   Problem: Elimination: Goal: Will not experience complications related to bowel motility Outcome: Progressing   

## 2023-12-25 DIAGNOSIS — I4891 Unspecified atrial fibrillation: Secondary | ICD-10-CM | POA: Diagnosis not present

## 2023-12-25 DIAGNOSIS — S72002A Fracture of unspecified part of neck of left femur, initial encounter for closed fracture: Secondary | ICD-10-CM | POA: Diagnosis not present

## 2023-12-25 DIAGNOSIS — I5032 Chronic diastolic (congestive) heart failure: Secondary | ICD-10-CM | POA: Diagnosis not present

## 2023-12-25 DIAGNOSIS — S79929A Unspecified injury of unspecified thigh, initial encounter: Secondary | ICD-10-CM | POA: Diagnosis not present

## 2023-12-25 DIAGNOSIS — F10920 Alcohol use, unspecified with intoxication, uncomplicated: Secondary | ICD-10-CM | POA: Diagnosis not present

## 2023-12-25 DIAGNOSIS — F03918 Unspecified dementia, unspecified severity, with other behavioral disturbance: Secondary | ICD-10-CM | POA: Diagnosis not present

## 2023-12-25 DIAGNOSIS — S7292XD Unspecified fracture of left femur, subsequent encounter for closed fracture with routine healing: Secondary | ICD-10-CM | POA: Diagnosis not present

## 2023-12-25 DIAGNOSIS — R52 Pain, unspecified: Secondary | ICD-10-CM | POA: Diagnosis not present

## 2023-12-25 DIAGNOSIS — I10 Essential (primary) hypertension: Secondary | ICD-10-CM | POA: Diagnosis not present

## 2023-12-25 DIAGNOSIS — J441 Chronic obstructive pulmonary disease with (acute) exacerbation: Secondary | ICD-10-CM | POA: Diagnosis not present

## 2023-12-25 DIAGNOSIS — R2689 Other abnormalities of gait and mobility: Secondary | ICD-10-CM | POA: Diagnosis not present

## 2023-12-25 DIAGNOSIS — I480 Paroxysmal atrial fibrillation: Secondary | ICD-10-CM | POA: Diagnosis not present

## 2023-12-25 DIAGNOSIS — I959 Hypotension, unspecified: Secondary | ICD-10-CM | POA: Diagnosis not present

## 2023-12-25 DIAGNOSIS — R339 Retention of urine, unspecified: Secondary | ICD-10-CM | POA: Diagnosis not present

## 2023-12-25 DIAGNOSIS — S72142A Displaced intertrochanteric fracture of left femur, initial encounter for closed fracture: Secondary | ICD-10-CM | POA: Diagnosis not present

## 2023-12-25 DIAGNOSIS — S72142D Displaced intertrochanteric fracture of left femur, subsequent encounter for closed fracture with routine healing: Secondary | ICD-10-CM | POA: Diagnosis not present

## 2023-12-25 DIAGNOSIS — D649 Anemia, unspecified: Secondary | ICD-10-CM | POA: Diagnosis not present

## 2023-12-25 DIAGNOSIS — R531 Weakness: Secondary | ICD-10-CM | POA: Diagnosis not present

## 2023-12-25 DIAGNOSIS — Z7401 Bed confinement status: Secondary | ICD-10-CM | POA: Diagnosis not present

## 2023-12-25 DIAGNOSIS — M6281 Muscle weakness (generalized): Secondary | ICD-10-CM | POA: Diagnosis not present

## 2023-12-25 DIAGNOSIS — C3490 Malignant neoplasm of unspecified part of unspecified bronchus or lung: Secondary | ICD-10-CM | POA: Diagnosis not present

## 2023-12-25 DIAGNOSIS — I499 Cardiac arrhythmia, unspecified: Secondary | ICD-10-CM | POA: Diagnosis not present

## 2023-12-25 MED ORDER — QUETIAPINE FUMARATE 25 MG PO TABS: 25.0000 mg | ORAL_TABLET | Freq: Once | ORAL | Status: DC

## 2023-12-25 NOTE — TOC Transition Note (Signed)
 Transition of Care [facility]) - Discharge Note   Patient Details  Name: Cynthia Kelley MRN: [id number] Date of Birth: 26-Mar-1956  Transition of Care [facility]) CM/SW Contact:  Donnalee Curry, LCSWA Phone Number: 12/25/2023, 9:42 AM   Clinical Narrative:     SW spoke with Lorene Dy [facility] [phone]) reports bed available today. Room: 116A Call Report: [id number]  PTAR called.   Family notified by RN  Final next level of care: Skilled Nursing Facility Barriers to Discharge: Barriers Resolved   Patient Goals and CMS Choice   CMS Medicare.gov Compare Post Acute Care list provided to:: Patient Choice offered to / list presented to : Spouse [place] ownership interest in [facility].provided to:: Spouse    Discharge Placement              Patient chooses bed at:  [facility]) Patient to be transferred to facility by: PTAR Name of family member notified: [name] Patient and family notified of of transfer: 12/25/23  Discharge Plan and Services Additional resources added to the After Visit Summary for   In-house Referral: Clinical Social Work                                   Social Drivers of Health (SDOH) Interventions SDOH Screenings   Food Insecurity: No Food Insecurity (12/16/2023)  Housing: Low Risk  (12/16/2023)  Transportation Needs: No Transportation Needs (12/16/2023)  Utilities: Not At Risk (12/16/2023)  Alcohol Screen: Low Risk  (12/16/2022)  Depression (PHQ2-9): Low Risk  (12/08/2023)  Financial Resource Strain: Low Risk  (12/16/2022)  Physical Activity: Inactive (12/16/2022)  Social Connections: Moderately Integrated (12/16/2023)  Stress: No Stress Concern Present (12/16/2022)  Tobacco Use: High Risk (12/16/2023)     Readmission Risk Interventions    03/01/2022   11:11 AM  Readmission Risk Prevention Plan  Transportation Screening Complete  HRI or Home Care Consult Complete  Social Work Consult for Recovery Care Planning/Counseling  Complete  Palliative Care Screening --  Medication Review Oceanographer) Complete

## 2023-12-25 NOTE — Progress Notes (Signed)
 Verne Carrow to be D/C'd Skilled nursing facility per MD order.  Discussed with the patient and all questions fully answered.  VSS, Skin clean, dry and intact without evidence of skin break down, no evidence of skin tears noted. IV catheter discontinued intact. Site without signs and symptoms of complications. Dressing and pressure applied.  An After Visit Summary was printed and given to [facility] for receiving facility.  Report called to the director of nursing at [facility].  Patient instructed to return to ED, call 911, or call MD for any changes in condition.   Patient escorted via stretcher, and D/C to [facility] via non emergency ambulance.  Pauletta Browns 12/25/2023 10:40 AM

## 2023-12-25 NOTE — Discharge Summary (Signed)
 Physician Discharge Summary  Cynthia Kelley ZOX:[id number] DOB: Mar 13, 1956 DOA: 12/15/2023  PCP: Anabel Halon, MD  Admit date: 12/15/2023 Discharge date: 12/25/2023  Admitted From: Home Disposition:  SNF  Recommendations for Outpatient Follow-up:  Follow up with PCP in 1-2 weeks Please obtain BMP/CBC in one week   Home Health:No Equipment/Devices:None  Discharge Condition:Stable CODE STATUS:Full Diet recommendation: Heart Healthy   Brief/Interim Summary: 68 y.o. female past medical history significant for past medical history of alcohol abuse, atrial flutter, dementia, chronic diastolic dysfunction presented to [facility] on transfer to [facility] after fall she was found to have a left intertrochanteric femur fracture orthopedic surgery was consulted and now she status post surgical intervention.  Elicitation was complicated by acute blood loss, alcoholic withdrawals and A-fib with RVR.  She was having urinary retention so Foley had to be placed cardiology was consulted for A-fib with RVR rate improved with medication, but it was complicated with hypotension.  PT evaluated the patient recommended skilled nursing facility placement, but due to her hypotension she was observed overnight   Discharge Diagnoses:  Principal Problem:   Closed left femoral fracture (HCC) Active Problems:   Alcohol intoxication (HCC)   Lung cancer, primary, with metastasis from lung to other site [facility])   Chronic diastolic CHF (congestive heart failure) (HCC)   Dementia with behavioral disturbance (HCC)   Paroxysmal atrial fibrillation (HCC)   Atrial fibrillation with RVR (HCC)  Close left femoral hip fracture secondary to mechanical fall in the setting of inebriation: Imaging showed left hip fracture orthopedic surgery was consulted and she status post intramedullary nailing of the left intertrochanteric femur on 12/16/2023. Orthopedic agree with apixaban for DVT prophylaxis. Was complaining of left knee  pain x-ray was done that showed severe osteoarthritis she will need to follow-up with orthopedics as an outpatient. PT evaluated the patient she will go to skilled nursing facility.  Hypotension: Unclear etiology, but she is currently on metoprolol 50 mg and her blood pressure seems to be stable.  Alcohol intoxication, then alcohol withdrawal: On admission alcohol level slightly at 91. She started on thiamine and folate. She started withdrawal she was started on CIWA protocol and she completed her course in house.  Acute metabolic encephalopathy superimposed on dementia without behavioral disturbances: She started sundowning 2 days prior to discharge she was tried on Seroquel but continued to be agitated getting out of bed she had to be placed on restraints which were discontinued. Melatonin was added. And after this she remained calm. She had no restraints overnight day prior to discharge.  Paroxysmal A-fib: As she was hard to control she was on amiodarone which was eventually discontinued. Her metoprolol was increased and digoxin was added. Heart rate remained controlled. Continue Eliquis no changes made to her Eliquis.  Chronic diastolic dysfunction: Appears to be compensated continue beta-blocker and can resume diuretics as an outpatient.  Hyponatremia: Now resolved.  Hypokalemia: Repleted now improved.  Acute urinary retention: Foley was placed.  Voiding trial was given and Foley has been removed.  Leukocytosis: Likely reactive.     She was started on vitamin D supplementation.    Discharge Instructions  Discharge Instructions     Diet - low sodium heart healthy   Complete by: As directed    Diet - low sodium heart healthy   Complete by: As directed    Diet - low sodium heart healthy   Complete by: As directed    Increase activity slowly   Complete by: As directed  Increase activity slowly   Complete by: As directed    Increase activity slowly    Complete by: As directed    No wound care   Complete by: As directed    No wound care   Complete by: As directed    No wound care   Complete by: As directed       Allergies as of 12/25/2023   No Known Allergies      Medication List     STOP taking these medications    amiodarone 200 MG tablet Commonly known as: PACERONE       TAKE these medications    acetaminophen 500 MG tablet Commonly known as: TYLENOL Take 1,000 mg by mouth every 6 (six) hours as needed for mild pain (pain score 1-3).   apixaban 5 MG Tabs tablet Commonly known as: ELIQUIS Take 5 mg by mouth 2 (two) times daily.   digoxin 0.125 MG tablet Commonly known as: LANOXIN Take 1 tablet (0.125 mg total) by mouth daily.   docusate sodium 100 MG capsule Commonly known as: COLACE Take 1 capsule (100 mg total) by mouth 2 (two) times daily.   furosemide 20 MG tablet Commonly known as: LASIX Take 1 tablet (20 mg total) by mouth daily.   methocarbamol 500 MG tablet Commonly known as: ROBAXIN Take 1 tablet (500 mg total) by mouth every 6 (six) hours as needed for muscle spasms.   metoprolol succinate 25 MG 24 hr tablet Commonly known as: TOPROL-XL Take 2 tablets (50 mg total) by mouth daily. What changed: how much to take   mirabegron ER 50 MG Tb24 tablet Commonly known as: Myrbetriq Take 1 tablet (50 mg total) by mouth daily.   mirtazapine 7.5 MG tablet Commonly known as: REMERON Take 1 tablet (7.5 mg total) by mouth at bedtime.   multivitamin with minerals tablet Take 1 tablet by mouth daily.   oxyCODONE 5 MG immediate release tablet Commonly known as: Oxy IR/ROXICODONE Take 1 tablet (5 mg total) by mouth every 4 (four) hours as needed for severe pain (pain score 7-10).   vitamin D3 25 MCG tablet Commonly known as: CHOLECALCIFEROL Take 2 tablets (2,000 Units total) by mouth daily.        Contact information for follow-up providers     Haddix, Gillie Manners, MD. Schedule an appointment as  soon as possible for a visit in 2 week(s).   Specialty: Orthopedic Surgery Why: for wound check and repeat x-rays Contact information: [address] [place] [address] [phone]              Contact information for after-discharge care     Destination     [facility] .   Service: Skilled Nursing Contact information: [address] [place] [address] [phone]                    No Known Allergies  Consultations: Orthopedic surgery   Procedures/Studies: DG Knee Left Port Result Date: 12/17/2023 CLINICAL DATA:  242330 Left knee pain 242330 EXAM: PORTABLE LEFT KNEE - 1-2 VIEW COMPARISON:  X-ray left femur 12/16/2023 FINDINGS: No evidence of fracture, dislocation, or joint effusion. Severe tricompartmental degenerative changes of the knee. Intramedullary nail fixation of the distal femur partially visualized. Plate and screw fixation of the tibia metadiaphysis. Soft tissues are unremarkable. IMPRESSION: 1.  Severe tricompartmental degenerative changes of the knee. 2.  No acute displaced fracture or dislocation. Electronically Signed   By: Tish Frederickson  M.D.   On: 12/17/2023 22:32   DG FEMUR PORT MIN 2 VIEWS LEFT Result Date: 12/16/2023 CLINICAL DATA:  Femur fracture, postop. EXAM: LEFT FEMUR PORTABLE 2 VIEWS COMPARISON:  Preoperative imaging FINDINGS: Femoral intramedullary nail with trans trochanteric and distal locking screw fixation traverse intertrochanteric femur fracture. Improved fracture alignment from preoperative imaging. The bones are diffusely under mineralized. Questionable lucent lesion in the proximal femoral shaft, fixated by femoral nail. Recent postsurgical change includes air and edema in the soft tissues. IMPRESSION: 1. ORIF of intertrochanteric femur fracture with improved alignment. 2. Questionable lucent lesion in the proximal femoral shaft, fixated by femoral nail. Recommend attention on follow-up.  Electronically Signed   By: Narda Rutherford M.D.   On: 12/16/2023 14:54   DG FEMUR MIN 2 VIEWS LEFT Result Date: 12/16/2023 CLINICAL DATA:  Elective surgery. EXAM: LEFT FEMUR 2 VIEWS COMPARISON:  Preoperative imaging FINDINGS: Seven fluoroscopic spot views of the left femur submitted from the operating room. Femoral intramedullary nail with trans trochanteric and distal locking screw fixation traverse proximal femur fracture. Fluoroscopy time 1 minutes 47 seconds. Dose 9.94 mGy. IMPRESSION: Intraoperative fluoroscopy during proximal femur fracture fixation. Electronically Signed   By: Narda Rutherford M.D.   On: 12/16/2023 14:52   DG C-Arm 1-60 Min-No Report Result Date: 12/16/2023 Fluoroscopy was utilized by the requesting physician.  No radiographic interpretation.   CT Head Wo Contrast Result Date: 12/15/2023 CLINICAL DATA:  Larey Seat EXAM: CT HEAD WITHOUT CONTRAST TECHNIQUE: Contiguous axial images were obtained from the base of the skull through the vertex without intravenous contrast. RADIATION DOSE REDUCTION: This exam was performed according to the departmental dose-optimization program which includes automated exposure control, adjustment of the mA and/or kV according to patient size and/or use of iterative reconstruction technique. COMPARISON:  02/27/2022 FINDINGS: Brain: Stable scattered hypodensities throughout the periventricular white matter consistent with chronic small vessel ischemic changes. No evidence of acute infarct or hemorrhage. The lateral ventricles and midline structures are unremarkable. No acute extra-axial fluid collections. No mass effect. Vascular: No hyperdense vessel or unexpected calcification. Skull: Normal. Negative for fracture or focal lesion. Sinuses/Orbits: No acute finding. Other: None. IMPRESSION: 1. Stable head CT, no acute intracranial process. Electronically Signed   By: Sharlet Salina M.D.   On: 12/15/2023 21:34   DG Shoulder Right Result Date:  12/15/2023 CLINICAL DATA:  Fall, pain EXAM: RIGHT SHOULDER - 2+ VIEW COMPARISON:  None Available. FINDINGS: There is a left humeral neck fracture with marked angulation and displacement. No definite dislocation. AC joint is intact. IMPRESSION: Markedly angulated and displaced right humeral neck fracture. Electronically Signed   By: Charlett Nose M.D.   On: 12/15/2023 19:24   DG Chest 1 View Result Date: 12/15/2023 CLINICAL DATA:  Fall, left hip fracture EXAM: CHEST  1 VIEW COMPARISON:  09/04/2022 FINDINGS: Right Port-A-Cath remains in place, unchanged. Heart and mediastinal contours are within normal limits. Aortic atherosclerosis. No confluent airspace opacities, effusions or edema. No acute bony abnormality. Old healed left rib fracture. IMPRESSION: No active disease. Electronically Signed   By: Charlett Nose M.D.   On: 12/15/2023 19:23   DG Hip Unilat W or Wo Pelvis 2-3 Views Left Result Date: 12/15/2023 CLINICAL DATA:  Fall.  Left hip pain.  Left leg shortening. EXAM: DG HIP (WITH OR WITHOUT PELVIS) 2-3V LEFT COMPARISON:  None Available. FINDINGS: There is a left femoral intertrochanteric fracture with severe angulation. No subluxation or dislocation. Hip joints and SI joints symmetric and unremarkable. IMPRESSION: Left femoral intertrochanteric fracture  with severe varus angulation. Electronically Signed   By: Charlett Nose M.D.   On: 12/15/2023 19:21   Subjective: No complaints  Discharge Exam: Vitals:   12/25/23 0552 12/25/23 0707  BP: (!) 105/59 (!) 91/47  Pulse: 69 77  Resp: 18   Temp: 97.7 F (36.5 C) 97.7 F (36.5 C)  SpO2: 96% 97%   Vitals:   12/24/23 1631 12/24/23 2125 12/25/23 0552 12/25/23 0707  BP: 107/66 117/61 (!) 105/59 (!) 91/47  Pulse: 81 80 69 77  Resp: 16 16 18    Temp: 97.9 F (36.6 C) (!) 97.5 F (36.4 C) 97.7 F (36.5 C) 97.7 F (36.5 C)  TempSrc:  Oral Oral Oral  SpO2: 100% 98% 96% 97%  Weight:      Height:        General: Pt is alert, awake, not in acute  distress Cardiovascular: RRR, S1/S2 +, no rubs, no gallops Respiratory: CTA bilaterally, no wheezing, no rhonchi Abdominal: Soft, NT, ND, bowel sounds + Extremities: no edema, no cyanosis    The results of significant diagnostics from this hospitalization (including imaging, microbiology, ancillary and laboratory) are listed below for reference.     Microbiology: No results found for this or any previous visit (from the past 240 hours).   Labs: BNP (last 3 results) Recent Labs    05/29/23 1135  BNP 942.0*   Basic Metabolic Panel: Recent Labs  Lab 12/19/23 0822 12/20/23 0637 12/21/23 0617  NA 138 137 136  K 4.9 4.4 4.3  CL 106 106 104  CO2 25 27 26   GLUCOSE 111* 103* 121*  BUN 18 22 20   CREATININE 0.80 0.88 0.71  CALCIUM 8.9 8.5* 8.5*  MG 1.9 1.8 2.0  PHOS 3.8 4.5 3.3   Liver Function Tests: Recent Labs  Lab 12/19/23 0822 12/20/23 0637 12/21/23 0617  AST 25 25 28   ALT 15 15 17   ALKPHOS 66 76 91  BILITOT 0.9 1.0 1.3*  PROT 5.8* 6.0* 6.1*  ALBUMIN 2.9* 2.9* 2.9*   No results for input(s): "LIPASE", "AMYLASE" in the last 168 hours. No results for input(s): "AMMONIA" in the last 168 hours. CBC: Recent Labs  Lab 12/19/23 0822 12/20/23 0637 12/21/23 0617  WBC 9.2 9.7 8.6  NEUTROABS 6.6 7.1 6.4  HGB 8.8* 8.7* 8.5*  HCT 27.0* 26.5* 25.7*  MCV 100.4* 100.0 98.8  PLT 253 265 294   Cardiac Enzymes: No results for input(s): "CKTOTAL", "CKMB", "CKMBINDEX", "TROPONINI" in the last 168 hours. BNP: Invalid input(s): "POCBNP" CBG: No results for input(s): "GLUCAP" in the last 168 hours. D-Dimer No results for input(s): "DDIMER" in the last 72 hours. Hgb A1c No results for input(s): "HGBA1C" in the last 72 hours. Lipid Profile No results for input(s): "CHOL", "HDL", "LDLCALC", "TRIG", "CHOLHDL", "LDLDIRECT" in the last 72 hours. Thyroid function studies No results for input(s): "TSH", "T4TOTAL", "T3FREE", "THYROIDAB" in the last 72 hours.  Invalid  input(s): "FREET3" Anemia work up No results for input(s): "VITAMINB12", "FOLATE", "FERRITIN", "TIBC", "IRON", "RETICCTPCT" in the last 72 hours. Urinalysis    Component Value Date/Time   COLORURINE AMBER (A) 12/24/2023 0928   APPEARANCEUR HAZY (A) 12/24/2023 0928   LABSPEC 1.023 12/24/2023 0928   PHURINE 6.0 12/24/2023 0928   GLUCOSEU NEGATIVE 12/24/2023 0928   HGBUR SMALL (A) 12/24/2023 0928   BILIRUBINUR SMALL (A) 12/24/2023 0928   BILIRUBINUR negative 03/19/2023 1052   BILIRUBINUR small 09/15/2017 1210   KETONESUR NEGATIVE 12/24/2023 0928   PROTEINUR NEGATIVE 12/24/2023 0928   UROBILINOGEN  0.2 03/19/2023 1052   NITRITE NEGATIVE 12/24/2023 0928   LEUKOCYTESUR MODERATE (A) 12/24/2023 0928   Sepsis Labs Recent Labs  Lab 12/19/23 0822 12/20/23 0637 12/21/23 0617  WBC 9.2 9.7 8.6   Microbiology No results found for this or any previous visit (from the past 240 hours).   Time coordinating discharge: Over 35 minutes  SIGNED:   Marinda Elk, MD  [facility] 12/25/2023, 10:14 AM Pager   If 7PM-7AM, please contact night-coverage [email or web address] Password [email or web address]

## 2023-12-27 DIAGNOSIS — R52 Pain, unspecified: Secondary | ICD-10-CM | POA: Diagnosis not present

## 2023-12-27 DIAGNOSIS — I10 Essential (primary) hypertension: Secondary | ICD-10-CM | POA: Diagnosis not present

## 2023-12-27 DIAGNOSIS — S72142A Displaced intertrochanteric fracture of left femur, initial encounter for closed fracture: Secondary | ICD-10-CM | POA: Diagnosis not present

## 2023-12-27 DIAGNOSIS — I5032 Chronic diastolic (congestive) heart failure: Secondary | ICD-10-CM | POA: Diagnosis not present

## 2023-12-27 DIAGNOSIS — I480 Paroxysmal atrial fibrillation: Secondary | ICD-10-CM | POA: Diagnosis not present

## 2023-12-27 DIAGNOSIS — R339 Retention of urine, unspecified: Secondary | ICD-10-CM | POA: Diagnosis not present

## 2024-01-02 DIAGNOSIS — I10 Essential (primary) hypertension: Secondary | ICD-10-CM | POA: Diagnosis not present

## 2024-01-02 DIAGNOSIS — R52 Pain, unspecified: Secondary | ICD-10-CM | POA: Diagnosis not present

## 2024-01-02 DIAGNOSIS — S72142A Displaced intertrochanteric fracture of left femur, initial encounter for closed fracture: Secondary | ICD-10-CM | POA: Diagnosis not present

## 2024-01-02 DIAGNOSIS — R339 Retention of urine, unspecified: Secondary | ICD-10-CM | POA: Diagnosis not present

## 2024-01-02 DIAGNOSIS — I5032 Chronic diastolic (congestive) heart failure: Secondary | ICD-10-CM | POA: Diagnosis not present

## 2024-01-02 DIAGNOSIS — I480 Paroxysmal atrial fibrillation: Secondary | ICD-10-CM | POA: Diagnosis not present

## 2024-01-03 DIAGNOSIS — I480 Paroxysmal atrial fibrillation: Secondary | ICD-10-CM | POA: Diagnosis not present

## 2024-01-03 DIAGNOSIS — D649 Anemia, unspecified: Secondary | ICD-10-CM | POA: Diagnosis not present

## 2024-01-03 DIAGNOSIS — S7292XD Unspecified fracture of left femur, subsequent encounter for closed fracture with routine healing: Secondary | ICD-10-CM | POA: Diagnosis not present

## 2024-01-03 DIAGNOSIS — I5032 Chronic diastolic (congestive) heart failure: Secondary | ICD-10-CM | POA: Diagnosis not present

## 2024-01-03 DIAGNOSIS — J441 Chronic obstructive pulmonary disease with (acute) exacerbation: Secondary | ICD-10-CM | POA: Diagnosis not present

## 2024-01-03 DIAGNOSIS — I10 Essential (primary) hypertension: Secondary | ICD-10-CM | POA: Diagnosis not present

## 2024-01-06 DIAGNOSIS — R52 Pain, unspecified: Secondary | ICD-10-CM | POA: Diagnosis not present

## 2024-01-06 DIAGNOSIS — R531 Weakness: Secondary | ICD-10-CM | POA: Diagnosis not present

## 2024-01-06 DIAGNOSIS — I480 Paroxysmal atrial fibrillation: Secondary | ICD-10-CM | POA: Diagnosis not present

## 2024-01-06 DIAGNOSIS — I5032 Chronic diastolic (congestive) heart failure: Secondary | ICD-10-CM | POA: Diagnosis not present

## 2024-01-06 DIAGNOSIS — S72142A Displaced intertrochanteric fracture of left femur, initial encounter for closed fracture: Secondary | ICD-10-CM | POA: Diagnosis not present

## 2024-01-09 DIAGNOSIS — D649 Anemia, unspecified: Secondary | ICD-10-CM | POA: Diagnosis not present

## 2024-01-09 DIAGNOSIS — I10 Essential (primary) hypertension: Secondary | ICD-10-CM | POA: Diagnosis not present

## 2024-01-09 DIAGNOSIS — I480 Paroxysmal atrial fibrillation: Secondary | ICD-10-CM | POA: Diagnosis not present

## 2024-01-09 DIAGNOSIS — S7292XD Unspecified fracture of left femur, subsequent encounter for closed fracture with routine healing: Secondary | ICD-10-CM | POA: Diagnosis not present

## 2024-01-09 DIAGNOSIS — I5032 Chronic diastolic (congestive) heart failure: Secondary | ICD-10-CM | POA: Diagnosis not present

## 2024-01-09 DIAGNOSIS — J441 Chronic obstructive pulmonary disease with (acute) exacerbation: Secondary | ICD-10-CM | POA: Diagnosis not present

## 2024-01-16 DIAGNOSIS — S72142D Displaced intertrochanteric fracture of left femur, subsequent encounter for closed fracture with routine healing: Secondary | ICD-10-CM | POA: Diagnosis not present

## 2024-01-18 DIAGNOSIS — D649 Anemia, unspecified: Secondary | ICD-10-CM | POA: Diagnosis not present

## 2024-01-18 DIAGNOSIS — I480 Paroxysmal atrial fibrillation: Secondary | ICD-10-CM | POA: Diagnosis not present

## 2024-01-18 DIAGNOSIS — I10 Essential (primary) hypertension: Secondary | ICD-10-CM | POA: Diagnosis not present

## 2024-01-18 DIAGNOSIS — I5032 Chronic diastolic (congestive) heart failure: Secondary | ICD-10-CM | POA: Diagnosis not present

## 2024-01-18 DIAGNOSIS — S7292XD Unspecified fracture of left femur, subsequent encounter for closed fracture with routine healing: Secondary | ICD-10-CM | POA: Diagnosis not present

## 2024-01-18 DIAGNOSIS — J441 Chronic obstructive pulmonary disease with (acute) exacerbation: Secondary | ICD-10-CM | POA: Diagnosis not present

## 2024-01-21 DIAGNOSIS — C3490 Malignant neoplasm of unspecified part of unspecified bronchus or lung: Secondary | ICD-10-CM | POA: Diagnosis not present

## 2024-01-21 DIAGNOSIS — S72142A Displaced intertrochanteric fracture of left femur, initial encounter for closed fracture: Secondary | ICD-10-CM | POA: Diagnosis not present

## 2024-01-21 DIAGNOSIS — R52 Pain, unspecified: Secondary | ICD-10-CM | POA: Diagnosis not present

## 2024-01-23 DIAGNOSIS — J441 Chronic obstructive pulmonary disease with (acute) exacerbation: Secondary | ICD-10-CM | POA: Diagnosis not present

## 2024-01-23 DIAGNOSIS — I10 Essential (primary) hypertension: Secondary | ICD-10-CM | POA: Diagnosis not present

## 2024-01-23 DIAGNOSIS — D649 Anemia, unspecified: Secondary | ICD-10-CM | POA: Diagnosis not present

## 2024-01-23 DIAGNOSIS — I5032 Chronic diastolic (congestive) heart failure: Secondary | ICD-10-CM | POA: Diagnosis not present

## 2024-01-23 DIAGNOSIS — S7292XD Unspecified fracture of left femur, subsequent encounter for closed fracture with routine healing: Secondary | ICD-10-CM | POA: Diagnosis not present

## 2024-01-23 DIAGNOSIS — I480 Paroxysmal atrial fibrillation: Secondary | ICD-10-CM | POA: Diagnosis not present

## 2024-03-14 ENCOUNTER — Ambulatory Visit

## 2024-03-25 ENCOUNTER — Other Ambulatory Visit: Payer: Self-pay

## 2024-03-25 ENCOUNTER — Emergency Department (HOSPITAL_COMMUNITY)

## 2024-03-25 ENCOUNTER — Emergency Department (HOSPITAL_COMMUNITY)
Admission: EM | Admit: 2024-03-25 | Discharge: 2024-03-25 | Disposition: A | Discharge status: Home / Self Care | Attending: Emergency Medicine | Admitting: Emergency Medicine

## 2024-03-25 DIAGNOSIS — W19XXXA Unspecified fall, initial encounter: Secondary | ICD-10-CM | POA: Insufficient documentation

## 2024-03-25 DIAGNOSIS — F0390 Unspecified dementia without behavioral disturbance: Secondary | ICD-10-CM | POA: Insufficient documentation

## 2024-03-25 DIAGNOSIS — Z7901 Long term (current) use of anticoagulants: Secondary | ICD-10-CM | POA: Insufficient documentation

## 2024-03-25 DIAGNOSIS — I959 Hypotension, unspecified: Secondary | ICD-10-CM | POA: Diagnosis present

## 2024-03-25 DIAGNOSIS — Z79899 Other long term (current) drug therapy: Secondary | ICD-10-CM | POA: Diagnosis not present

## 2024-03-25 NOTE — ED Notes (Signed)
 ED Provider at bedside.

## 2024-03-25 NOTE — ED Triage Notes (Signed)
 Pt bib [facility] from [facility] for unwitnessed fall, unknown LOC. Pt is on Eliquis  but is unsure if she hit her head. Pt has dementia and oriented to self only.

## 2024-03-25 NOTE — ED Provider Notes (Signed)
 [place] EMERGENCY DEPARTMENT AT [facility]  Provider Note  CSN: [id number] Arrival date & time: 03/25/24 0007  History Chief Complaint  Patient presents with   Cynthia    Anslie Kelley is a 68 y.o. female brought to the ED via EMS from local LTCF after an unwitnessed fall. Patient is on Eliquis  but no known head injury. She denies any complaints.    Home Medications Prior to Admission medications   Medication Sig Start Date End Date Taking? Authorizing Provider  acetaminophen  (TYLENOL ) 500 MG tablet Take 1,000 mg by mouth every 6 (six) hours as needed for mild pain (pain score 1-3).    [provider]  apixaban  (ELIQUIS ) 5 MG TABS tablet Take 5 mg by mouth 2 (two) times daily. 09/27/20   [provider]  cholecalciferol  (CHOLECALCIFEROL ) 25 MCG tablet Take 2 tablets (2,000 Units total) by mouth daily. 12/19/23   Danton Lauraine LABOR, PA-C  digoxin  (LANOXIN ) 0.125 MG tablet Take 1 tablet (0.125 mg total) by mouth daily. 12/23/23   Odell Celinda Balo, MD  docusate sodium  (COLACE) 100 MG capsule Take 1 capsule (100 mg total) by mouth 2 (two) times daily. 12/23/23   Odell Celinda Balo, MD  furosemide  (LASIX ) 20 MG tablet Take 1 tablet (20 mg total) by mouth daily. 05/29/23   Suzette Pac, MD  methocarbamol  (ROBAXIN ) 500 MG tablet Take 1 tablet (500 mg total) by mouth every 6 (six) hours as needed for muscle spasms. 12/19/23   Danton Lauraine LABOR, PA-C  metoprolol  succinate (TOPROL -XL) 25 MG 24 hr tablet Take 2 tablets (50 mg total) by mouth daily. 12/23/23   Odell Celinda Balo, MD  mirabegron  ER (MYRBETRIQ ) 50 MG TB24 tablet Take 1 tablet (50 mg total) by mouth daily. 12/08/23   Tobie Suzzane POUR, MD  mirtazapine  (REMERON ) 7.5 MG tablet Take 1 tablet (7.5 mg total) by mouth at bedtime. 12/08/23   Tobie Suzzane POUR, MD  Multiple Vitamins-Minerals (MULTIVITAMIN WITH MINERALS) tablet Take 1 tablet by mouth daily.    [provider]  oxyCODONE  (OXY IR/ROXICODONE ) 5 MG  immediate release tablet Take 1 tablet (5 mg total) by mouth every 4 (four) hours as needed for severe pain (pain score 7-10). 12/19/23   Danton Lauraine LABOR, PA-C     Allergies    Patient has no known allergies.   Review of Systems   Review of Systems Please see HPI for pertinent positives and negatives  Physical Exam BP 120/72   Pulse 64   Temp 97.6 F (36.4 C) (Oral)   Resp 15   SpO2 95%   Physical Exam Vitals and nursing note reviewed.  Constitutional:      Appearance: Normal appearance.     Comments: thin  HENT:     Head: Normocephalic and atraumatic.     Nose: Nose normal.     Mouth/Throat:     Mouth: Mucous membranes are moist.   Eyes:     Extraocular Movements: Extraocular movements intact.     Conjunctiva/sclera: Conjunctivae normal.    Cardiovascular:     Rate and Rhythm: Normal rate.  Pulmonary:     Effort: Pulmonary effort is normal.     Breath sounds: Normal breath sounds.     Comments: Dual lumen port in R chest Abdominal:     General: Abdomen is flat.     Palpations: Abdomen is soft.     Tenderness: There is no abdominal tenderness.   Musculoskeletal:  General: No swelling, tenderness, deformity or signs of injury. Normal range of motion.     Cervical back: Neck supple.   Skin:    General: Skin is warm and dry.   Neurological:     General: No focal deficit present.     Mental Status: She is alert.   Psychiatric:        Mood and Affect: Mood normal.     ED Results / Procedures / Treatments   EKG None  Procedures Procedures  Medications Ordered in the ED Medications - No data to display  Initial Impression and Plan  Patient here after unwitnessed fall, denies any complaints. Exam is reassuring. Borderline hypotension on initial vitals improved on recheck. Will check head CT given anticoagulant use but no external signs of injury.   ED Course   Clinical Course as of 03/25/24 0120  [place] Mar 25, 2024  0108 I personally viewed  the images from radiology studies and agree with radiologist interpretation: CT neg for acute injury [CS]  0119 Patient remains well appearing in no distress. No further complaints. Husband at bedside is comfortable taking her back to LTCF. PCP follow up, RTED for any other concerns.   [CS]    Clinical Course User Index [CS] Roselyn Carlin NOVAK, MD     MDM Rules/Calculators/A&P Medical Decision Making Problems Addressed: Fall, initial encounter: acute illness or injury  Amount and/or Complexity of Data Reviewed Radiology: ordered and independent interpretation performed. Decision-making details documented in ED Course.     Final Clinical Impression(s) / ED Diagnoses Final diagnoses:  Fall, initial encounter    Rx / DC Orders ED Discharge Orders     None        Roselyn Carlin NOVAK, MD 03/25/24 0120

## 2024-03-28 ENCOUNTER — Emergency Department (HOSPITAL_COMMUNITY)

## 2024-03-28 ENCOUNTER — Emergency Department (HOSPITAL_COMMUNITY)
Admission: EM | Admit: 2024-03-28 | Discharge: 2024-03-29 | Disposition: A | Discharge status: Home / Self Care | Attending: Emergency Medicine | Admitting: Emergency Medicine

## 2024-03-28 ENCOUNTER — Encounter (HOSPITAL_COMMUNITY): Payer: Self-pay | Admitting: Emergency Medicine

## 2024-03-28 DIAGNOSIS — Z7901 Long term (current) use of anticoagulants: Secondary | ICD-10-CM | POA: Diagnosis not present

## 2024-03-28 DIAGNOSIS — S0990XA Unspecified injury of head, initial encounter: Secondary | ICD-10-CM

## 2024-03-28 DIAGNOSIS — J449 Chronic obstructive pulmonary disease, unspecified: Secondary | ICD-10-CM | POA: Insufficient documentation

## 2024-03-28 DIAGNOSIS — I5031 Acute diastolic (congestive) heart failure: Secondary | ICD-10-CM | POA: Insufficient documentation

## 2024-03-28 DIAGNOSIS — S0083XA Contusion of other part of head, initial encounter: Secondary | ICD-10-CM | POA: Diagnosis not present

## 2024-03-28 DIAGNOSIS — Z85118 Personal history of other malignant neoplasm of bronchus and lung: Secondary | ICD-10-CM | POA: Diagnosis not present

## 2024-03-28 DIAGNOSIS — F17210 Nicotine dependence, cigarettes, uncomplicated: Secondary | ICD-10-CM | POA: Diagnosis not present

## 2024-03-28 DIAGNOSIS — W19XXXA Unspecified fall, initial encounter: Secondary | ICD-10-CM | POA: Insufficient documentation

## 2024-03-28 NOTE — ED Provider Notes (Signed)
 [place] EMERGENCY DEPARTMENT AT [facility] Provider Note  CSN: [id number] Arrival date & time: 03/28/24 2033  Chief Complaint(s) Fall  HPI Cynthia Kelley is a 68 y.o. female history of dementia, on hospice, lung cancer presenting from nursing facility after fall.  Patient had a unwitnessed fall.  Reported to fall and hit head.  Patient is on Eliquis .  History limited due to dementia.  Husband reports no other new symptoms like fevers or vomiting.   Past Medical History Past Medical History:  Diagnosis Date   A-fib [facility])    Acute diastolic congestive heart failure, NYHA class 2 (HCC) 11/11/2020   Anemia 11/22/2017   Anxiety 11/19/2020   Arthritis    Asymptomatic PVD (peripheral vascular disease) (HCC) 12/14/2016   Suspected.  Hands hyperemic with decreased cap refill. ? History vasospasm   COPD with acute exacerbation (HCC) 01/14/2021   Encounter to establish care 11/19/2020   FRACTURE, TIBIAL PLATEAU 05/11/2010   Qualifier: Diagnosis of  By: Margrette MD, Stanley     Lung cancer, primary, with metastasis from lung to other site [facility]) 01/16/2018   Metastatic to the liver   Nuclear sclerotic cataract of both eyes 06/08/2018   Osteopenia 05/15/2013   Osteoporosis    osteopenia   Tobacco abuse 12/14/2016   Patient Active Problem List   Diagnosis Date Noted   Atrial fibrillation with RVR (HCC) 12/21/2023   Closed left femoral fracture (HCC) 12/15/2023   Alcohol intoxication (HCC) 12/15/2023   Encounter for general adult medical examination with abnormal findings 12/08/2023   Insomnia 07/01/2023   Overactive bladder 07/01/2023   Dementia with behavioral disturbance (HCC) 02/27/2022   Paroxysmal atrial fibrillation (HCC) 02/27/2022   Anemia 03/03/2021   Thrombocytopenia (HCC) 03/03/2021   COPD with acute exacerbation (HCC) 01/14/2021   Hyponatremia 01/12/2021   Alcohol dependence (HCC) 01/12/2021   Anxiety 11/19/2020   Chronic diastolic CHF (congestive heart  failure) (HCC) 11/11/2020   Typical atrial flutter (HCC) 11/11/2020   Oropharyngeal dysphagia 08/13/2020   Colon cancer screening 07/19/2018   Dry eye syndrome of both eyes 06/08/2018   Nuclear sclerotic cataract of both eyes 06/08/2018   Posterior vitreous detachment of both eyes 06/08/2018   Lung cancer, primary, with metastasis from lung to other site [facility]) 01/16/2018   Malignant neoplasm metastatic to bone (HCC) 11/10/2017   Metastatic squamous cell carcinoma 10/20/2017   Tobacco abuse 12/14/2016   Osteopenia 05/15/2013   KNEE, ARTHRITIS, DEGEN./OSTEO 05/11/2010   Home Medication(s) Prior to Admission medications   Medication Sig Start Date End Date Taking? Authorizing Provider  acetaminophen  (TYLENOL ) 500 MG tablet Take 1,000 mg by mouth every 6 (six) hours as needed for mild pain (pain score 1-3).    [provider]  apixaban  (ELIQUIS ) 5 MG TABS tablet Take 5 mg by mouth 2 (two) times daily. 09/27/20   [provider]  cholecalciferol  (CHOLECALCIFEROL ) 25 MCG tablet Take 2 tablets (2,000 Units total) by mouth daily. 12/19/23   Danton Lauraine LABOR, PA-C  digoxin  (LANOXIN ) 0.125 MG tablet Take 1 tablet (0.125 mg total) by mouth daily. 12/23/23   Odell Celinda Balo, MD  docusate sodium  (COLACE) 100 MG capsule Take 1 capsule (100 mg total) by mouth 2 (two) times daily. 12/23/23   Odell Celinda Balo, MD  furosemide  (LASIX ) 20 MG tablet Take 1 tablet (20 mg total) by mouth daily. 05/29/23   Suzette Pac, MD  methocarbamol  (ROBAXIN ) 500 MG tablet Take 1 tablet (500 mg total) by mouth every 6 (six) hours  as needed for muscle spasms. 12/19/23   Danton Lauraine LABOR, PA-C  metoprolol  succinate (TOPROL -XL) 25 MG 24 hr tablet Take 2 tablets (50 mg total) by mouth daily. 12/23/23   Odell Celinda Balo, MD  mirabegron  ER (MYRBETRIQ ) 50 MG TB24 tablet Take 1 tablet (50 mg total) by mouth daily. 12/08/23   Tobie Suzzane POUR, MD  mirtazapine  (REMERON ) 7.5 MG tablet Take 1 tablet (7.5 mg total)  by mouth at bedtime. 12/08/23   Tobie Suzzane POUR, MD  Multiple Vitamins-Minerals (MULTIVITAMIN WITH MINERALS) tablet Take 1 tablet by mouth daily.    [provider]  oxyCODONE  (OXY IR/ROXICODONE ) 5 MG immediate release tablet Take 1 tablet (5 mg total) by mouth every 4 (four) hours as needed for severe pain (pain score 7-10). 12/19/23   Danton Lauraine LABOR PA-C                                                                                                                                    Past Surgical History Past Surgical History:  Procedure Laterality Date   FRACTURE SURGERY     torn ACL   INTRAMEDULLARY (IM) NAIL INTERTROCHANTERIC Left 12/16/2023   Procedure: FIXATION, FRACTURE, INTERTROCHANTERIC, WITH INTRAMEDULLARY ROD;  Surgeon: Kendal Franky SQUIBB, MD;  Location: [facility] OR;  Service: Orthopedics;  Laterality: Left;   KNEE ARTHROSCOPY     PORTACATH PLACEMENT Right 2019   Family History Family History  Problem Relation Age of Onset   Cancer Father        died in 39's everywhere   Heart disease Mother    Cancer Mother        lung   Stroke Mother    Asthma Daughter     Social History Social History   Tobacco Use   Smoking status: Every Day    Current packs/day: 0.50    Average packs/day: 0.5 packs/day for 60.5 years (30.2 ttl pk-yrs)    Types: Cigarettes    Start date: 09/28/1963    Passive exposure: Current   Smokeless tobacco: Never   Tobacco comments:    Smoking Cessation Offered. 1 ppd as of 12/08/23  Vaping Use   Vaping status: Never Used  Substance Use Topics   Alcohol use: Yes    Alcohol/week: 20.0 standard drinks of alcohol    Types: 20 Cans of beer per week    Comment: 3-4 beers a day   Drug use: No   Allergies Patient has no known allergies.  Review of Systems Review of Systems  Unable to perform ROS: Dementia    Physical Exam Vital Signs  I have reviewed the triage vital signs BP 120/76   Pulse 73   Temp 97.8 F (36.6 C) (Axillary)   Resp 15    Wt 50.5 kg   SpO2 95%   BMI 17.71 kg/m  Physical Exam Vitals and nursing note reviewed.  Constitutional:      General: She  is not in acute distress.    Appearance: She is well-developed.  HENT:     Head: Normocephalic.     Comments: Small amount of bruising to the superior forehead.  No wound    Mouth/Throat:     Mouth: Mucous membranes are moist.  Eyes:     Pupils: Pupils are equal, round, and reactive to light.  Cardiovascular:     Rate and Rhythm: Normal rate and regular rhythm.     Heart sounds: No murmur heard. Pulmonary:     Effort: Pulmonary effort is normal. No respiratory distress.     Breath sounds: Normal breath sounds.  Abdominal:     General: Abdomen is flat.     Palpations: Abdomen is soft.     Tenderness: There is no abdominal tenderness.  Musculoskeletal:        General: No tenderness.     Right lower leg: No edema.     Left lower leg: No edema.     Comments: No midline C, T, L-spine tenderness.  No chest wall tenderness or crepitus.  Full painless range of motion at the bilateral upper extremities including the shoulders, elbows, wrists, hand and fingers, and in the bilateral lower extremities including the hips, knees, ankle, toes.  No focal bony tenderness, injury or deformity.  Skin:    General: Skin is warm and dry.  Neurological:     Mental Status: She is alert.     Comments: Oriented to self, moves all 4 extremities spontaneously  Psychiatric:        Mood and Affect: Mood normal.        Behavior: Behavior normal.     ED Results and Treatments Labs (all labs ordered are listed, but only abnormal results are displayed) Labs Reviewed - No data to display                                                                                                                        Radiology CT Head Wo Contrast Result Date: 03/28/2024 CLINICAL DATA:  Unwitnessed fall, head injury, on Eliquis  EXAM: CT HEAD WITHOUT CONTRAST CT CERVICAL SPINE WITHOUT CONTRAST  TECHNIQUE: Multidetector CT imaging of the head and cervical spine was performed following the standard protocol without intravenous contrast. Multiplanar CT image reconstructions of the cervical spine were also generated. RADIATION DOSE REDUCTION: This exam was performed according to the departmental dose-optimization program which includes automated exposure control, adjustment of the mA and/or kV according to patient size and/or use of iterative reconstruction technique. COMPARISON:  03/25/2024 FINDINGS: CT HEAD FINDINGS Brain: No evidence of acute infarction, hemorrhage, hydrocephalus, extra-axial collection or mass lesion/mass effect. Periventricular white matter hypodensity. Vascular: No hyperdense vessel or unexpected calcification. Skull: Normal. Negative for fracture or focal lesion. Sinuses/Orbits: No acute finding. Other: None. CT CERVICAL SPINE FINDINGS Alignment: Normal. Skull base and vertebrae: No acute fracture. No primary bone lesion or focal pathologic process. Soft tissues and spinal canal: No prevertebral fluid or swelling. No visible  canal hematoma. Disc levels: Focally severe disc space height loss and osteophytosis from C5-C7 with otherwise preserved disc spaces. Upper chest: Negative. Other: None. IMPRESSION: 1. No acute intracranial pathology. Small-vessel white matter disease 2. No fracture or static subluxation of the cervical spine. 3. Focally severe disc degenerative disease from C5-C7 with otherwise preserved disc spaces. Electronically Signed   By: Marolyn JONETTA Jaksch M.D.   On: 03/28/2024 21:20   CT Cervical Spine Wo Contrast Result Date: 03/28/2024 CLINICAL DATA:  Unwitnessed fall, head injury, on Eliquis  EXAM: CT HEAD WITHOUT CONTRAST CT CERVICAL SPINE WITHOUT CONTRAST TECHNIQUE: Multidetector CT imaging of the head and cervical spine was performed following the standard protocol without intravenous contrast. Multiplanar CT image reconstructions of the cervical spine were also  generated. RADIATION DOSE REDUCTION: This exam was performed according to the departmental dose-optimization program which includes automated exposure control, adjustment of the mA and/or kV according to patient size and/or use of iterative reconstruction technique. COMPARISON:  03/25/2024 FINDINGS: CT HEAD FINDINGS Brain: No evidence of acute infarction, hemorrhage, hydrocephalus, extra-axial collection or mass lesion/mass effect. Periventricular white matter hypodensity. Vascular: No hyperdense vessel or unexpected calcification. Skull: Normal. Negative for fracture or focal lesion. Sinuses/Orbits: No acute finding. Other: None. CT CERVICAL SPINE FINDINGS Alignment: Normal. Skull base and vertebrae: No acute fracture. No primary bone lesion or focal pathologic process. Soft tissues and spinal canal: No prevertebral fluid or swelling. No visible canal hematoma. Disc levels: Focally severe disc space height loss and osteophytosis from C5-C7 with otherwise preserved disc spaces. Upper chest: Negative. Other: None. IMPRESSION: 1. No acute intracranial pathology. Small-vessel white matter disease 2. No fracture or static subluxation of the cervical spine. 3. Focally severe disc degenerative disease from C5-C7 with otherwise preserved disc spaces. Electronically Signed   By: Marolyn JONETTA Jaksch M.D.   On: 03/28/2024 21:20    Pertinent labs & imaging results that were available during my care of the patient were reviewed by me and considered in my medical decision making (see MDM for details).  Medications Ordered in ED Medications - No data to display                                                                                                                                   Procedures Procedures  (including critical care time)  Medical Decision Making / ED Course   MDM:  68 year old presenting with unwitnessed fall.  Patient overall in no acute distress.  Does have small injury to the anterior  forehead.  Exam otherwise without signs of focal traumatic injury.  No other new history per husband or paramedics.  Patient is on hospice care.  Do not think there is an indication for further testing or admission at this time.  Patient is stable for discharge back to nursing facility.      Additional history obtained: -Additional history obtained from EMS, spouse  -External records from outside source obtained and  reviewed including: Chart review including previous notes, labs, imaging, consultation notes including prior ER visit for fall    Imaging Studies ordered: I ordered imaging studies including CT head, CT cervical spine  On my interpretation imaging demonstrates no acute finding  I independently visualized and interpreted imaging. I agree with the radiologist interpretation   Medicines ordered and prescription drug management: No orders of the defined types were placed in this encounter.   -I have reviewed the patients home medicines and have made adjustments as needed  Co morbidities that complicate the patient evaluation  Past Medical History:  Diagnosis Date   A-fib (HCC)    Acute diastolic congestive heart failure, NYHA class 2 (HCC) 11/11/2020   Anemia 11/22/2017   Anxiety 11/19/2020   Arthritis    Asymptomatic PVD (peripheral vascular disease) (HCC) 12/14/2016   Suspected.  Hands hyperemic with decreased cap refill. ? History vasospasm   COPD with acute exacerbation (HCC) 01/14/2021   Encounter to establish care 11/19/2020   FRACTURE, TIBIAL PLATEAU 05/11/2010   Qualifier: Diagnosis of  By: Margrette MD, Stanley     Lung cancer, primary, with metastasis from lung to other site [facility]) 01/16/2018   Metastatic to the liver   Nuclear sclerotic cataract of both eyes 06/08/2018   Osteopenia 05/15/2013   Osteoporosis    osteopenia   Tobacco abuse 12/14/2016      Dispostion: Disposition decision including need for hospitalization was considered, and patient  discharged from emergency department.    Final Clinical Impression(s) / ED Diagnoses Final diagnoses:  Injury of head, initial encounter     This chart was dictated using voice recognition software.  Despite best efforts to proofread,  errors can occur which can change the documentation meaning.    Francesca Elsie CROME, MD 03/28/24 [phone]

## 2024-03-28 NOTE — ED Notes (Signed)
 C collar removed

## 2024-03-28 NOTE — ED Notes (Signed)
 ED Provider at bedside.

## 2024-03-28 NOTE — Discharge Instructions (Addendum)
 We evaluated Cynthia Kelley for her head injury.  Her testing in the emergency department did not show signs of any dangerous injury such as a brain injury.  We feel that it is safe for her to go home at this time.  Please bring her back for any new or worsening symptoms such as fevers or further falls

## 2024-03-28 NOTE — ED Triage Notes (Signed)
 Pt arrives via EMS from [facility] after unwitnessed fall. Reports pt hit head and on eliquis . C-collar in place by EMS.

## 2024-03-28 NOTE — ED Notes (Signed)
 Patient transported to CT

## 2024-04-05 ENCOUNTER — Other Ambulatory Visit: Payer: Self-pay

## 2024-04-05 ENCOUNTER — Encounter (HOSPITAL_COMMUNITY): Payer: Self-pay

## 2024-04-05 ENCOUNTER — Emergency Department (HOSPITAL_COMMUNITY)
Admission: EM | Admit: 2024-04-05 | Discharge: 2024-04-05 | Disposition: A | Discharge status: Home / Self Care | Source: Skilled Nursing Facility | Attending: Emergency Medicine | Admitting: Emergency Medicine

## 2024-04-05 DIAGNOSIS — Z7901 Long term (current) use of anticoagulants: Secondary | ICD-10-CM | POA: Insufficient documentation

## 2024-04-05 DIAGNOSIS — E860 Dehydration: Secondary | ICD-10-CM | POA: Insufficient documentation

## 2024-04-05 DIAGNOSIS — I509 Heart failure, unspecified: Secondary | ICD-10-CM | POA: Diagnosis not present

## 2024-04-05 DIAGNOSIS — Z85118 Personal history of other malignant neoplasm of bronchus and lung: Secondary | ICD-10-CM | POA: Insufficient documentation

## 2024-04-05 DIAGNOSIS — J449 Chronic obstructive pulmonary disease, unspecified: Secondary | ICD-10-CM | POA: Diagnosis not present

## 2024-04-05 DIAGNOSIS — R339 Retention of urine, unspecified: Secondary | ICD-10-CM | POA: Diagnosis not present

## 2024-04-05 LAB — CBC WITH DIFFERENTIAL/PLATELET
Abs Immature Granulocytes: 0.15 K/uL — ABNORMAL HIGH (ref 0.00–0.07)
Basophils Absolute: 0.1 K/uL (ref 0.0–0.1)
Basophils Relative: 0 %
Eosinophils Absolute: 0 K/uL (ref 0.0–0.5)
Eosinophils Relative: 0 %
HCT: 46.6 % — ABNORMAL HIGH (ref 36.0–46.0)
Hemoglobin: 15.5 g/dL — ABNORMAL HIGH (ref 12.0–15.0)
Immature Granulocytes: 1 %
Lymphocytes Relative: 9 %
Lymphs Abs: 1.3 K/uL (ref 0.7–4.0)
MCH: 31.9 pg (ref 26.0–34.0)
MCHC: 33.3 g/dL (ref 30.0–36.0)
MCV: 95.9 fL (ref 80.0–100.0)
Monocytes Absolute: 1 K/uL (ref 0.1–1.0)
Monocytes Relative: 7 %
Neutro Abs: 12.7 K/uL — ABNORMAL HIGH (ref 1.7–7.7)
Neutrophils Relative %: 83 %
Platelets: 342 K/uL (ref 150–400)
RBC: 4.86 MIL/uL (ref 3.87–5.11)
RDW: 18.4 % — ABNORMAL HIGH (ref 11.5–15.5)
WBC: 15.2 K/uL — ABNORMAL HIGH (ref 4.0–10.5)
nRBC: 0 % (ref 0.0–0.2)

## 2024-04-05 LAB — URINALYSIS, ROUTINE W REFLEX MICROSCOPIC
Glucose, UA: NEGATIVE mg/dL
Hgb urine dipstick: NEGATIVE
Ketones, ur: NEGATIVE mg/dL
Leukocytes,Ua: NEGATIVE
Nitrite: NEGATIVE
Protein, ur: 100 mg/dL — AB
Specific Gravity, Urine: 1.027 (ref 1.005–1.030)
pH: 5 (ref 5.0–8.0)

## 2024-04-05 LAB — BASIC METABOLIC PANEL WITH GFR
Anion gap: 15 (ref 5–15)
BUN: 37 mg/dL — ABNORMAL HIGH (ref 8–23)
CO2: 19 mmol/L — ABNORMAL LOW (ref 22–32)
Calcium: 9 mg/dL (ref 8.9–10.3)
Chloride: 99 mmol/L (ref 98–111)
Creatinine, Ser: 1.54 mg/dL — ABNORMAL HIGH (ref 0.44–1.00)
GFR, Estimated: 36 mL/min — ABNORMAL LOW (ref >60)
Glucose, Bld: 131 mg/dL — ABNORMAL HIGH (ref 70–99)
Potassium: 4 mmol/L (ref 3.5–5.1)
Sodium: 133 mmol/L — ABNORMAL LOW (ref 135–145)

## 2024-04-05 MED ORDER — SODIUM CHLORIDE 0.9 % IV BOLUS
500.0000 mL | Freq: Once | INTRAVENOUS | Status: AC
  Administered 2024-04-05: 500 mL via INTRAVENOUS

## 2024-04-05 NOTE — ED Notes (Signed)
 Pt had bladder scan done due to facility saying she hasn't made urine in a few hours per EMS. Scan was done numerous times and the highest number observed was in the 70's. Pt not c/o pain at all, and is resting at this time in her hallway room.

## 2024-04-05 NOTE — Discharge Instructions (Addendum)
 Her work up this evening showed dehydration.  No evidence of UTI.  Follow up with her primary provider for recheck

## 2024-04-05 NOTE — ED Notes (Signed)
Bladder scan=77ml.

## 2024-04-05 NOTE — ED Triage Notes (Signed)
 BIB EMS/ urinary retention for a few hours today/ facility states that she has been up to the bathroom 12-15 times to try and void/ pt does not recall when last void was/ pt is A&OX3 @ baseline/

## 2024-04-05 NOTE — ED Provider Notes (Signed)
 [place] EMERGENCY DEPARTMENT AT [facility] Provider Note   CSN: [id number] Arrival date & time: 04/05/24  1736     Patient presents with: Urinary Retention   Cynthia Kelley is a 68 y.o. female.   HPI     Cynthia Kelley is a 68 y.o. female brought in by EMS, resides at [facility], patient is under hospice care with history of lung cancer with mets to bone, CHF, COPD, A-fib she is accompanied by her husband.  He states that she had normal urine output yesterday.  Has not voided today.  Had bladder scan at the facility that showed less than 100 cc of urine.  No reported fever chills nausea or vomiting or reported pain.  Husband does state that she is sleeping more and has not had as much oral hydration as usual.  Here due to concern for acute urinary retention possible UTI.    Oncologist at [facility]  Prior to Admission medications   Medication Sig Start Date End Date Taking? Authorizing Provider  acetaminophen  (TYLENOL ) 500 MG tablet Take 1,000 mg by mouth every 6 (six) hours as needed for mild pain (pain score 1-3).    [provider]  apixaban  (ELIQUIS ) 5 MG TABS tablet Take 5 mg by mouth 2 (two) times daily. 09/27/20   [provider]  busPIRone (BUSPAR) 15 MG tablet Take 15 mg by mouth every other day. 03/27/24   [provider]  cholecalciferol  (CHOLECALCIFEROL ) 25 MCG tablet Take 2 tablets (2,000 Units total) by mouth daily. 12/19/23   Danton Lauraine LABOR, PA-C  digoxin  (LANOXIN ) 0.125 MG tablet Take 1 tablet (0.125 mg total) by mouth daily. 12/23/23   Odell Celinda Balo, MD  docusate sodium  (COLACE) 100 MG capsule Take 1 capsule (100 mg total) by mouth 2 (two) times daily. 12/23/23   Odell Celinda Balo, MD  donepezil (ARICEPT) 5 MG tablet Take 5 mg by mouth daily. 03/27/24   [provider]  folic acid  (FOLVITE ) 1 MG tablet Take 1 mg by mouth daily.    [provider]  furosemide  (LASIX ) 20 MG tablet Take 1 tablet (20 mg total) by  mouth daily. 05/29/23   Suzette Pac, MD  hydrOXYzine  (ATARAX ) 25 MG tablet Take 25 mg by mouth 3 (three) times daily. 03/27/24   [provider]  LORazepam  (ATIVAN ) 0.5 MG tablet Take 0.5 mg by mouth 4 (four) times daily as needed. 03/27/24   [provider]  methocarbamol  (ROBAXIN ) 500 MG tablet Take 1 tablet (500 mg total) by mouth every 6 (six) hours as needed for muscle spasms. 12/19/23   Danton Lauraine LABOR, PA-C  metoprolol  succinate (TOPROL -XL) 50 MG 24 hr tablet Take 50 mg by mouth daily. 03/22/24   [provider]  mirabegron  ER (MYRBETRIQ ) 50 MG TB24 tablet Take 1 tablet (50 mg total) by mouth daily. 12/08/23   Tobie Suzzane POUR, MD  mirtazapine  (REMERON ) 7.5 MG tablet Take 1 tablet (7.5 mg total) by mouth at bedtime. 12/08/23   Tobie Suzzane POUR, MD  Multiple Vitamins-Minerals (MULTIVITAMIN WITH MINERALS) tablet Take 1 tablet by mouth daily.    [provider]  oxybutynin (DITROPAN-XL) 10 MG 24 hr tablet Take 10 mg by mouth daily.    [provider]  oxyCODONE  (OXY IR/ROXICODONE ) 5 MG immediate release tablet Take 1 tablet (5 mg total) by mouth every 4 (four) hours as needed for severe pain (pain score 7-10). 12/19/23   Danton Lauraine LABOR, PA-C  traMADol (ULTRAM-ER) 100 MG  24 hr tablet Take 100 mg by mouth 2 (two) times daily. 03/27/24   [provider]    Allergies: Patient has no known allergies.    Review of Systems  Unable to perform ROS: Other    Updated Vital Signs BP 125/75 (BP Location: Left Arm)   Pulse 80   Temp 98 F (36.7 C) (Oral)   Resp 14   Wt 52 kg   SpO2 99%   BMI 18.23 kg/m   Physical Exam Vitals and nursing note reviewed.  Constitutional:      Comments: Patient sleeping  HENT:     Mouth/Throat:     Comments: Membranes dry Eyes:     Conjunctiva/sclera: Conjunctivae normal.     Pupils: Pupils are equal, round, and reactive to light.  Cardiovascular:     Rate and Rhythm: Normal rate and regular rhythm.     Pulses:  Normal pulses.  Pulmonary:     Effort: Pulmonary effort is normal.     Breath sounds: Normal breath sounds.  Abdominal:     General: There is no distension.     Palpations: Abdomen is soft.     Tenderness: There is no abdominal tenderness.     Comments: Abdomen is soft nontender no distention.  No suprapubic tenderness on exam.  Musculoskeletal:     Right lower leg: No edema.     Left lower leg: No edema.  Skin:    General: Skin is warm.     Capillary Refill: Capillary refill takes less than 2 seconds.  Neurological:     General: No focal deficit present.     Sensory: No sensory deficit.     Motor: No weakness.     (all labs ordered are listed, but only abnormal results are displayed) Labs Reviewed - No data to display  EKG: None  Radiology: No results found.   Procedures   Medications Ordered in the ED - No data to display                                  Medical Decision Making Patient here from [facility] she is a hospice patient here accompanied by her spouse.  Here for evaluation of concern for acute urinary retention. Bladder scan at the facility earlier today showed less than 100 cc urine.  States patient has not voided today.  But patient's spouse states that she has not had much oral intake today.  States that she seems to be resting comfortably.  No vomiting or fever.  Bladder scan here shows 91 cc   Possible dehydration, UTI, obstructive uropathy  Amount and/or Complexity of Data Reviewed Labs: ordered.    Details: Leukocytosis white count of 15,000 chemistries show elevated kidney functions, BUN 37 and serum creatinine 1.54 kidney functions were normal 3 months ago.  Cath urine without evidence of infection. Discussion of management or test interpretation with external provider(s): Patient currently under hospice care.  Workup this evening shows likely dehydration.  Has been resting comfortably.  Vital signs are reassuring.  No reported fever  or vomiting or dysuria symptoms.  IV fluids given here for comfort.  Patient's spouse who is at bedside agrees to close outpatient follow-up with PCP.        Final diagnoses:  Dehydration    ED Discharge Orders     None          Herlinda Milling, PA-C 04/05/24  [id number]    Cleotilde Rogue, MD 04/06/24 [phone]

## 2024-04-05 NOTE — ED Notes (Signed)
 Cynthia Kelley, hospice nurse called for update on pt. Says he will call back in a bit for more information when available.

## 2024-04-05 NOTE — ED Notes (Signed)
 Per Braden, pts Hospice Nurse, pt is on hospice and states  minimal interventions for pt but to run everything by pts husband

## 2024-04-27 DEATH — deceased

## 2024-05-01 ENCOUNTER — Encounter: Payer: Self-pay | Admitting: Internal Medicine

## 2024-06-11 ENCOUNTER — Ambulatory Visit: Payer: Self-pay | Admitting: Internal Medicine
